# Patient Record
Sex: Female | Born: 1950 | Race: Black or African American | Hispanic: No | Marital: Single | Smoking: Former smoker
Health system: Southern US, Community
[De-identification: ages and names within clinical notes are randomized; demographics above are authoritative.]

## PROBLEM LIST (undated history)

## (undated) DIAGNOSIS — C649 Malignant neoplasm of unspecified kidney, except renal pelvis: Secondary | ICD-10-CM

## (undated) DIAGNOSIS — Z720 Tobacco use: Secondary | ICD-10-CM

## (undated) DIAGNOSIS — N289 Disorder of kidney and ureter, unspecified: Secondary | ICD-10-CM

## (undated) DIAGNOSIS — I4891 Unspecified atrial fibrillation: Secondary | ICD-10-CM

## (undated) DIAGNOSIS — J45909 Unspecified asthma, uncomplicated: Secondary | ICD-10-CM

## (undated) DIAGNOSIS — I10 Essential (primary) hypertension: Secondary | ICD-10-CM

## (undated) DIAGNOSIS — R011 Cardiac murmur, unspecified: Secondary | ICD-10-CM

## (undated) DIAGNOSIS — J449 Chronic obstructive pulmonary disease, unspecified: Secondary | ICD-10-CM

## (undated) DIAGNOSIS — C801 Malignant (primary) neoplasm, unspecified: Secondary | ICD-10-CM

## (undated) DIAGNOSIS — G4700 Insomnia, unspecified: Secondary | ICD-10-CM

## (undated) DIAGNOSIS — I340 Nonrheumatic mitral (valve) insufficiency: Secondary | ICD-10-CM

## (undated) DIAGNOSIS — M1990 Unspecified osteoarthritis, unspecified site: Secondary | ICD-10-CM

## (undated) DIAGNOSIS — D571 Sickle-cell disease without crisis: Secondary | ICD-10-CM

## (undated) DIAGNOSIS — I499 Cardiac arrhythmia, unspecified: Secondary | ICD-10-CM

## (undated) HISTORY — DX: Cardiac murmur, unspecified: R01.1

## (undated) HISTORY — DX: Nonrheumatic mitral (valve) insufficiency: I34.0

## (undated) HISTORY — DX: Chronic obstructive pulmonary disease, unspecified: J44.9

## (undated) HISTORY — PX: BONE GRAFT HIP ILIAC CREST: SUR159

## (undated) HISTORY — DX: Tobacco use: Z72.0

## (undated) HISTORY — PX: OTHER SURGICAL HISTORY: SHX169

## (undated) HISTORY — DX: Unspecified atrial fibrillation: I48.91

## (undated) HISTORY — PX: TUBAL LIGATION: SHX77

## (undated) HISTORY — DX: Essential (primary) hypertension: I10

## (undated) HISTORY — PX: CERVICAL CONIZATION W/BX: SHX1330

## (undated) HISTORY — PX: TONSILLECTOMY: SUR1361

## (undated) HISTORY — PX: ABDOMINAL HYSTERECTOMY: SHX81

---

## 1999-07-21 ENCOUNTER — Encounter: Payer: Self-pay | Admitting: Emergency Medicine

## 1999-07-21 ENCOUNTER — Emergency Department (HOSPITAL_COMMUNITY): Admission: EM | Admit: 1999-07-21 | Discharge: 1999-07-21 | Payer: Self-pay

## 2000-01-29 ENCOUNTER — Encounter: Payer: Self-pay | Admitting: Urology

## 2000-01-31 ENCOUNTER — Encounter (INDEPENDENT_AMBULATORY_CARE_PROVIDER_SITE_OTHER): Payer: Self-pay | Admitting: Specialist

## 2000-01-31 ENCOUNTER — Inpatient Hospital Stay (HOSPITAL_COMMUNITY): Admission: RE | Admit: 2000-01-31 | Discharge: 2000-02-04 | Payer: Self-pay | Admitting: Urology

## 2000-03-12 ENCOUNTER — Encounter: Payer: Self-pay | Admitting: Orthopedic Surgery

## 2000-03-12 ENCOUNTER — Inpatient Hospital Stay (HOSPITAL_COMMUNITY): Admission: RE | Admit: 2000-03-12 | Discharge: 2000-03-17 | Payer: Self-pay | Admitting: Orthopedic Surgery

## 2000-09-10 ENCOUNTER — Encounter: Payer: Self-pay | Admitting: Urology

## 2000-09-10 ENCOUNTER — Encounter: Admission: RE | Admit: 2000-09-10 | Discharge: 2000-09-10 | Payer: Self-pay | Admitting: Urology

## 2001-01-27 HISTORY — PX: NEPHRECTOMY: SHX65

## 2008-06-01 ENCOUNTER — Ambulatory Visit (HOSPITAL_COMMUNITY): Admission: RE | Admit: 2008-06-01 | Discharge: 2008-06-01 | Payer: Self-pay | Admitting: Internal Medicine

## 2010-02-17 ENCOUNTER — Encounter (HOSPITAL_COMMUNITY): Payer: Self-pay | Admitting: Internal Medicine

## 2010-05-07 LAB — GLUCOSE, CAPILLARY: Glucose-Capillary: 106 mg/dL — ABNORMAL HIGH (ref 70–99)
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
## 2010-06-14 NOTE — Op Note (Signed)
[facility]  Patient:    Tracy Johnston, Tracy Johnston                         MRN: [id number] Proc. Date: 01/31/00 Adm. Date:  [id number] Attending:  Trisha Mangle                           Operative Report  PREOPERATIVE DIAGNOSIS:  Solid right renal mass.  POSTOPERATIVE DIAGNOSIS:  Solid right renal mass.  PROCEDURE PERFORMED:  Right radical nephrectomy.  SURGEON:  Mark C. Vernie Ammons, M.D.  ASSISTANT:  Lucrezia Starch. Ovidio Hanger, M.D.  DRAINS:  16-French Foley catheter in the bladder.  SPECIMENS:  Right kidney to pathology.  ESTIMATED BLOOD LOSS:  Approximately 50 cc.  COMPLICATIONS:  None.  INDICATIONS:  The patient is a 60 year old black female who had some sharp pain located in the mid back randomly, and at times severe enough to take her breath away, cause shortness of breath, diaphoresis, and dizziness.  A CT scan was obtained to evaluate the abdomen and it revealed normal liver, gallbladder, pancreas, spleen, and a mixed predominately solid appearing mass arising from the posterior interpolar region of the right kidney measuring 4.7 cm in size with contralateral renal units between 30 and 40, and no evidence of fat within the lesion.  There was some focal parenchymal loss of the upper pole and lower pole.  There was no evidence of lymphadenopathy or extension into the renal vein and I therefore discussed with the patient the fact that this could be inflammatory such as a xanthogranulomatous pyelonephritis or possibly another benign lesion, but also could possibly represent renal cell carcinoma.  Because of the atrophic nature of the kidney itself with a normal appearing contralateral renal unit and a suspicious lesion in the right kidney, which has been associated at times with significant pain, the patient has elected for surgical removal of the kidney.  The risks and complications were discussed as well as limitations.  She understands and wished to  proceed.  DESCRIPTION OF PROCEDURE:  After informed consent, the patient was brought to the main operating room and administered intravenous Kefzol 1 g.  She was then administered general endotracheal anesthesia and positioned with her right flank up under a bean bag.  Her flank was sterilely prepped and draped, and a transverse incision was made subcostally around to the anterior abdominal region and carried down through subcutaneous tissue.  The external oblique fascia and muscle as well as internal oblique muscle and fascia were divided and the transversalis muscle was parted along its muscle fibers and I entered the retroperitoneal space.  I extended the incision medially and entered the peritoneal cavity.  The colon was identified and I used a blunt technique to dissect between the ascending colonic mesentery and Gerotas fascia, and noted a moderate amount of scarring indicative of prior inflammatory process.  I dissected posteriorly behind the kidney and also identified the ureter inferiorly through a combination of sharp and blunt technique.  The ureter was then clipped with hemoclips and divided.  I incised along the superior aspect of the kidney placing hemoclips in this region and dividing the tissue between the kidney and adrenal gland maintaining the adrenal gland in situ.  I then dissected the remainder of the colon and duodenum from the hilar region and great vessels and identified the vasculature.  There was a small artery and a single vein.  These  were tied with 0 silk suture doubly and then a hemoclip was applied.  The vessels were then divided distally and the specimen freed and sent to pathology.  The wound was copiously irrigated with saline, bleeding was noted, and the liver was noted to be intact without injury.  The appendix appeared normal.  I positioned the ascending colon back in its normal anatomic position and then closed the incision with running #1 PDS  suture.  I closed the internal oblique fascia and then injected Marcaine beneath this.  The external oblique fascia was then closed in an identical fashion.  Further Marcaine was injected and the wound was irrigated with saline and closed with skin staples.  A sterile occlusive dressing was applied and the patient was awakened and taken to the recovery room in stable and satisfactory condition.  She tolerated the procedure well.  There were no intraoperative complications.  Needle, sponge, and instrument counts were reportedly correct x 2 at the end of the operation. D:  01/31/00 TD:  01/31/00 Job: [id number]

## 2010-06-14 NOTE — Discharge Summary (Signed)
[place]. [facility]  Patient:    Tracy, Johnston                         MRN: [id number] Adm. Date:  [id number] Disc. Date: 02/04/00 Attending:  Trisha Mangle CC:         Dr. 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  OTHER DIAGNOSIS:  Hypertension.  MAJOR OPERATION:  Right radical mastectomy.  DIET:  Regular.  ACTIVITY:  No heavy lifting, strenuous activity, or driving for three more weeks.  FOLLOW-UP APPOINTMENT:  Follow up will be in one week in my office to have her skin staples removed.  DISCHARGE MEDICATIONS: 1. Ditropan XL 10 mg q.d. #30. 2. Tylox 1 to 2 q.4h. p.r.n. #40. 3. Colace 100 mg b.i.d. #14.  HISTORY OF PRESENT ILLNESS:  The patient is a 60 year old black female who had a mass identified on her CT scan in her right kidney.  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  She has had some hypertension which I am not sure is related to this kidney, but it is possible.  She had also been having some pain in her right flank.  In addition to this, she has a history of urge incontinence which I have treated with anticholinergics, and she mentioned that that was effective.  The remainder of her history and physical was previously dictated and is noted on the chart.  HOSPITAL COURSE:  Her hospital course consists of routine admission and with the patient being taken to the operating room on the day of her admission. She underwent a right radical nephrectomy without complication and no need for transfusion.  There was approximately a 50 cc blood loss.  That evening she was doing well with slightly decreased breath sounds but overall was doing well from surgery.  On her first postoperative day, she was afebrile.  Dressing remained dry.  She was a little slow to ambulate and  pulmonary toilet was encouraged.  On her second postoperative day, she continued to have no flatus, remained on sips and chips.  Ambulation was encouraged, and her Foley was removed.  She was continued on intravenous fluids.  By the third postoperative day, she remained sore and had no bowel movement but had positive bowel sounds.  She was ambulating, and I stopped her morphine PCA and began oral pain medications as well as a clear liquid diet and laxatives.  By the February 04, 2000, she had had a bowel movement, was tolerating a regular diet and remained afebrile.  Her incision was healing well, and I discussed her pathology with her which revealed renal cell carcinoma with negative margins of resection.  The maximum tumor size was 4.3 cm.  It was clear cell grade 2.  The margins were negative.  There was no renal invasion or extension through the capsule.  This would be a pT1 lesion.  At this point, the patient is ready for discharge. DD:  02/04/00 TD:  02/04/00 Job: [id number]

## 2010-10-18 ENCOUNTER — Ambulatory Visit (INDEPENDENT_AMBULATORY_CARE_PROVIDER_SITE_OTHER): Payer: Medicare Other | Admitting: Cardiovascular Disease

## 2010-10-18 ENCOUNTER — Encounter: Payer: Self-pay | Admitting: Cardiovascular Disease

## 2010-10-18 DIAGNOSIS — I4891 Unspecified atrial fibrillation: Secondary | ICD-10-CM

## 2010-10-18 DIAGNOSIS — I739 Peripheral vascular disease, unspecified: Secondary | ICD-10-CM | POA: Insufficient documentation

## 2010-10-18 DIAGNOSIS — M79606 Pain in leg, unspecified: Secondary | ICD-10-CM

## 2010-10-18 DIAGNOSIS — Z720 Tobacco use: Secondary | ICD-10-CM | POA: Insufficient documentation

## 2010-10-18 DIAGNOSIS — I10 Essential (primary) hypertension: Secondary | ICD-10-CM

## 2010-10-18 DIAGNOSIS — M79609 Pain in unspecified limb: Secondary | ICD-10-CM

## 2010-10-18 MED ORDER — DILTIAZEM HCL ER COATED BEADS 240 MG PO CP24: 240.0000 mg | ORAL_CAPSULE | Freq: Every day | ORAL | Status: DC

## 2010-10-18 NOTE — Assessment & Plan Note (Signed)
This appears to be paroxysmal based on her symptoms. She continues to have irregular rhythm today. Her ventricular rate is controlled. I am concerned though that the atenolol might be contributing to some of her wheezing. Thus, I will go ahead and stop atenolol and switch her to diltiazem extended release 240 mg once daily. Given that her blood pressure is borderline low and in order to avoid being on to calcium channel blockers, I would also stop amlodipine. Her chads score is 1 due to history of hypertension. Her chads vas score is 2 due to female gender. At this time I recommend aspirin 81 mg once daily. I will obtain a 2-D echo to evaluate her left ventricular systolic function as well as left atrial size. I will decide upon followup of rhythm control is needed and will further evaluate the need for anticoagulation. I will obtain a thyroid panel if not already done during her hospitalization.

## 2010-10-18 NOTE — Progress Notes (Signed)
HPI  This is a 60 year old female who is referred for evaluation of atrial fibrillation which was diagnosed during her recent hospitalization at [facility]. Patient was hospitalized on September 10 until the 17th due to severe COPD and persistent cough. The patient has prolonged history of smoking. She quit smoking since her hospitalization. She was seen by Dr. Orson Aloe during her hospitalization. On presentation here ECG showed normal sinus rhythm. She was noted to have irregular heartbeats before discharge an ECG showed atrial fibrillation. Both of these were reviewed. The patient reports prolonged history of palpitations in the past but is not sure if she had atrial fibrillation. She has no previous history of stroke or congestive heart failure. She has no diabetes. She continues to have significant dyspnea and wheezing but her pulmonary symptoms improved after she quit smoking. She denies any chest tightness. She denies any palpitations or tachycardia. She does complain of significant cramping in her legs especially with walking.  Allergies  Allergen Reactions  . Aspirin Other (See Comments)    Bleeding at higher doses     No current outpatient prescriptions on file prior to visit.     Past Medical History  Diagnosis Date  . Atrial fibrillation   . Tobacco abuse   . Hypertension   . COPD (chronic obstructive pulmonary disease)      History reviewed. No pertinent past surgical history.   History reviewed. No pertinent family history.   History   Social History  . Marital Status: Single    Spouse Name: N/A    Number of Children: N/A  . Years of Education: N/A   Occupational History  . Not on file.   Social History Main Topics  . Smoking status: Former Smoker    Quit date: 10/14/2010  . Smokeless tobacco: Not on file   Comment: just quit since getting out of hospital   . Alcohol Use: Not on file  . Drug Use: Not on file  . Sexually Active: Not on file   Other  Topics Concern  . Not on file   Social History Narrative  . No narrative on file     ROS Constitutional: Negative for fever, chills, diaphoresis, activity change, appetite change and fatigue.  HENT: Negative for hearing loss, nosebleeds, congestion, sore throat, facial swelling, drooling, trouble swallowing, neck pain, voice change, sinus pressure and tinnitus.  Eyes: Negative for photophobia, pain, discharge and visual disturbance.  Respiratory: Negative for apnea,  chest tightness. Cardiovascular: Negative for chest pain, palpitations and leg swelling.  Gastrointestinal: Negative for nausea, vomiting, abdominal pain, diarrhea, constipation, blood in stool and abdominal distention.  Genitourinary: Negative for dysuria, urgency, frequency, hematuria and decreased urine volume.  Musculoskeletal: Negative for myalgias, back pain, joint swelling, arthralgias and gait problem.  Skin: Negative for color change, pallor, rash and wound.  Neurological: Negative for dizziness, tremors, seizures, syncope, speech difficulty, weakness, light-headedness, numbness and headaches.  Psychiatric/Behavioral: Negative for suicidal ideas, hallucinations, behavioral problems and agitation. The patient is not nervous/anxious.     PHYSICAL EXAM   BP 100/70  Pulse 88  Ht 5\' 2"  (1.575 m)  Wt 201 lb 12.8 oz (91.536 kg)  BMI 36.91 kg/m2  SpO2 95%  Constitutional: She is oriented to person, place, and time. She appears well-developed and well-nourished. No distress.  HENT: No nasal discharge.  Head: Normocephalic and atraumatic.  Eyes: Pupils are equal, round, and reactive to light. Right eye exhibits no discharge. Left eye exhibits no discharge.  Neck: Normal range of  motion. Neck supple. No JVD present. No thyromegaly present.  Cardiovascular: Normal rate, irregular rhythm, normal heart sounds and diminished distal pulses. Exam reveals no gallop and no friction rub.  No murmur heard.  Pulmonary/Chest:  Effort normal. No stridor. No respiratory distress. Auscultation reveals mild expiratory wheezing with bilateral rhonchi.  Abdominal: Soft. Bowel sounds are normal. She exhibits no distension. There is no tenderness. There is no rebound and no guarding.  Musculoskeletal: Normal range of motion. She exhibits no edema and no tenderness.  Neurological: She is alert and oriented to person, place, and time. Coordination normal.  Skin: Skin is warm and dry. No rash noted. She is not diaphoretic. No erythema. No pallor.  Psychiatric: She has a normal mood and affect. Her behavior is normal. Judgment and thought content normal.      ASSESSMENT AND PLAN

## 2010-10-18 NOTE — Assessment & Plan Note (Signed)
The patient reports lower extremity claudication. Given her prolonged history of smoking, I will obtain an ABI for further evaluation.

## 2010-10-18 NOTE — Assessment & Plan Note (Signed)
The patient quit smoking since her hospital discharge. I advised her to stay away from nicotine in the future.

## 2010-10-18 NOTE — Assessment & Plan Note (Signed)
Her blood pressure is borderline low. Changes have been made as mentioned above.

## 2010-10-18 NOTE — Patient Instructions (Signed)
Follow up as scheduled. Stop Atenolol. Stop Amlodipine. Start Diltiazem 240 mg daily. Your physician has requested that you have an echocardiogram. Echocardiography is a painless test that uses sound waves to create images of your heart. It provides your doctor with information about the size and shape of your heart and how well your heart's chambers and valves are working. This procedure takes approximately one hour. There are no restrictions for this procedure. Your physician has requested that you have an ankle brachial index (ABI). During this test an ultrasound and blood pressure cuff are used to evaluate the arteries that supply the arms and legs with blood. Allow thirty minutes for this exam. There are no restrictions or special instructions.

## 2010-10-24 ENCOUNTER — Telehealth: Payer: Self-pay | Admitting: Cardiovascular Disease

## 2010-10-24 ENCOUNTER — Other Ambulatory Visit (INDEPENDENT_AMBULATORY_CARE_PROVIDER_SITE_OTHER): Payer: Medicare Other | Admitting: *Deleted

## 2010-10-24 ENCOUNTER — Encounter (INDEPENDENT_AMBULATORY_CARE_PROVIDER_SITE_OTHER): Payer: Medicare Other | Admitting: *Deleted

## 2010-10-24 DIAGNOSIS — I10 Essential (primary) hypertension: Secondary | ICD-10-CM

## 2010-10-24 DIAGNOSIS — E1159 Type 2 diabetes mellitus with other circulatory complications: Secondary | ICD-10-CM

## 2010-10-24 DIAGNOSIS — I4891 Unspecified atrial fibrillation: Secondary | ICD-10-CM

## 2010-10-24 DIAGNOSIS — I739 Peripheral vascular disease, unspecified: Secondary | ICD-10-CM

## 2010-10-24 DIAGNOSIS — M79606 Pain in leg, unspecified: Secondary | ICD-10-CM

## 2010-10-25 NOTE — Telephone Encounter (Signed)
Echo was done yesterday. I do not see results available in Epic yet.

## 2010-10-25 NOTE — Telephone Encounter (Signed)
I have to make sure that her echo was fine first. Do you have results of her echo?

## 2010-10-28 NOTE — Telephone Encounter (Signed)
Patient informed. 

## 2010-10-28 NOTE — Telephone Encounter (Signed)
Inform her that she can have her teeth pulled. Echo showed normal heart function.

## 2010-10-29 ENCOUNTER — Telehealth: Payer: Self-pay | Admitting: Cardiovascular Disease

## 2010-10-29 NOTE — Telephone Encounter (Signed)
Pt calling stating she needs surgical clearance for dental procedure--pt sees dr Kirke Corin in [place]--i called dental office (dr jenson--[phone]) to see if they had received clearance and they had not received --i called pt back and she said someone in [place] office told pt clearance had been faxed--pt has appoint in [place] with dr Kirke Corin 10/3--advised to keep appoint with dr Kirke Corin and perhaps he can give clearance --also advised to call [place] office and find out who supposedly sent clearance--pt agrees--nt

## 2010-10-29 NOTE — Telephone Encounter (Signed)
Dr Eileen Stanford see Tracy Johnston, home health cannot do c-diff testing without order,so they are going to call dr Andrey Campanile and have him write order--nt

## 2010-10-29 NOTE — Telephone Encounter (Signed)
Pt calling wanting to see if we received information for pt surgical clearance. If so pt would like for [place] to fax that information over to the [place] office. Please return pt call to discuss further.

## 2010-10-31 ENCOUNTER — Encounter: Payer: Self-pay | Admitting: Cardiovascular Disease

## 2010-10-31 ENCOUNTER — Ambulatory Visit (INDEPENDENT_AMBULATORY_CARE_PROVIDER_SITE_OTHER): Payer: Medicare Other | Admitting: Cardiovascular Disease

## 2010-10-31 VITALS — BP 107/76 | HR 88 | Ht 62.0 in | Wt 197.4 lb

## 2010-10-31 DIAGNOSIS — I739 Peripheral vascular disease, unspecified: Secondary | ICD-10-CM

## 2010-10-31 DIAGNOSIS — I4891 Unspecified atrial fibrillation: Secondary | ICD-10-CM

## 2010-10-31 DIAGNOSIS — Z720 Tobacco use: Secondary | ICD-10-CM

## 2010-10-31 DIAGNOSIS — F17200 Nicotine dependence, unspecified, uncomplicated: Secondary | ICD-10-CM

## 2010-10-31 DIAGNOSIS — Z01810 Encounter for preprocedural cardiovascular examination: Secondary | ICD-10-CM | POA: Insufficient documentation

## 2010-10-31 NOTE — Patient Instructions (Signed)
Follow up as scheduled. Your physician recommends that you continue on your current medications as directed. Please refer to the Current Medication list given to you today. 

## 2010-10-31 NOTE — Progress Notes (Signed)
HPI  This is a 60 year old female who is here today for a followup visit. She was seen recently for atrial fibrillation. It appears that she had atrial fibrillation since she was in her early 25s. She was taken off anticoagulation in the past but did not have complications with that. She has no previous history of ischemic heart disease. She denies any chest pain. She has a long history of smoking but quit recently after she was hospitalized for COPD. During her last visit with me, she  had significant wheezing on exam and thus I decided to stop atenolol and switch her to diltiazem. The patient's respiratory status has improved significantly since her last visit. Her dyspnea has improved. She denies any palpitations. She has no previous bleeding history. He did complain of lower extremity discomfort but her ABI was within normal limits. The patient needs to have dental extraction under general anesthesia.  Allergies  Allergen Reactions  . Aspirin Other (See Comments)    Bleeding at higher doses     Current Outpatient Prescriptions on File Prior to Visit  Medication Sig Dispense Refill  . albuterol (ACCUNEB) 0.63 MG/3ML nebulizer solution Take 1 ampule by nebulization every 6 (six) hours as needed.        . diltiazem (CARTIA XT) 240 MG 24 hr capsule Take 1 capsule (240 mg total) by mouth daily.  30 capsule  3  . docusate sodium (COLACE) 100 MG capsule Take 100 mg by mouth 2 (two) times daily as needed.        . Fluticasone-Salmeterol (ADVAIR) 250-50 MCG/DOSE AEPB Inhale 1 puff into the lungs every 12 (twelve) hours.        Marland Kitchen olmesartan-hydrochlorothiazide (BENICAR HCT) 40-12.5 MG per tablet Take 1 tablet by mouth daily.        . pregabalin (LYRICA) 75 MG capsule Take 75 mg by mouth 2 (two) times daily.           Past Medical History  Diagnosis Date  . Tobacco abuse   . Hypertension   . COPD (chronic obstructive pulmonary disease)   . Atrial fibrillation     chronic. Was first diagnosed in  her early 79s.  . Mitral regurgitation     Mild to moderate. Normal EF 09/2010     No past surgical history on file.   No family history on file.   History   Social History  . Marital Status: Single    Spouse Name: N/A    Number of Children: N/A  . Years of Education: N/A   Occupational History  . Not on file.   Social History Main Topics  . Smoking status: Former Smoker    Quit date: 10/14/2010  . Smokeless tobacco: Not on file   Comment: just quit since getting out of hospital   . Alcohol Use: Not on file  . Drug Use: Not on file  . Sexually Active: Not on file   Other Topics Concern  . Not on file   Social History Narrative  . No narrative on file     PHYSICAL EXAM   BP 107/76  Pulse 88  Ht 5\' 2"  (1.575 m)  Wt 197 lb 6.4 oz (89.54 kg)  BMI 36.10 kg/m2  Constitutional: She is oriented to person, place, and time. She appears well-developed and well-nourished. No distress.  HENT: No nasal discharge.  Head: Normocephalic and atraumatic.  Eyes: Pupils are equal, round, and reactive to light. Right eye exhibits no discharge. Left eye exhibits no  discharge.  Neck: Normal range of motion. Neck supple. No JVD present. No thyromegaly present.  Cardiovascular: Normal rate, irregular rhythm, normal heart sounds and intact distal pulses. Exam reveals no gallop and no friction rub.  No murmur heard.  Pulmonary/Chest: Effort normal and breath sounds normal. No stridor. No respiratory distress. She has no wheezes. She has no rales. She exhibits no tenderness.  Abdominal: Soft. Bowel sounds are normal. She exhibits no distension. There is no tenderness. There is no rebound and no guarding.  Musculoskeletal: Normal range of motion. She exhibits no edema and no tenderness.  Neurological: She is alert and oriented to person, place, and time. Coordination normal.  Skin: Skin is warm and dry. No rash noted. She is not diaphoretic. No erythema. No pallor.  Psychiatric: She  has a normal mood and affect. Her behavior is normal. Judgment and thought content normal.     EKG: Atrial fibrillation with ventricular rate of 79 beats per minute. No significant ST or T wave changes. No evidence of prior infarcts.   ASSESSMENT AND PLAN

## 2010-10-31 NOTE — Assessment & Plan Note (Signed)
I congratulated her on smoking cessation. 

## 2010-10-31 NOTE — Assessment & Plan Note (Signed)
The patient seems to have chronic atrial fibrillation. She does not seem to be significantly symptomatic from this and thus I recommended control strategy. This is currently being done with diltiazem which seems to be working better than atenolol. Have [place] VAS score is 2 due to hypotension and female gender. Given that she is in chronic atrial fibrillation also her overall thromboembolic risk is higher than the average. Thus, I recommend long-term anticoagulation to decrease her future risk of stroke. This will be initiated after her dental extraction surgery and after we make sure that her labs are within acceptable range. I discussed forms of anticoagulation that include warfarin or other options. The patient prefers something other than warfarin and will likely start her on Pradaxa upon followup. She will followup in one month from now.

## 2010-10-31 NOTE — Assessment & Plan Note (Signed)
Her symptoms seems to be neuropathic in nature. Her ABI was normal.

## 2010-10-31 NOTE — Assessment & Plan Note (Signed)
The patient is going to have dental extraction under general anesthesia. She has no previous history of ischemic heart disease and does not have convincing symptoms of angina. Her ECG does not show any ischemic changes. Her functional capacity is reduced but not poor. Thus, the patient can undergo the planned procedure at an overall low risk from a cardiac standpoint. Aspirin can be stopped 5-7 days before surgery if needed although I would prefer her to stay on 81 mg once daily if at all possible. Diltiazem should be continued for rate control.

## 2010-11-18 ENCOUNTER — Ambulatory Visit (HOSPITAL_BASED_OUTPATIENT_CLINIC_OR_DEPARTMENT_OTHER)
Admission: RE | Admit: 2010-11-18 | Discharge: 2010-11-18 | Disposition: A | Discharge status: Home / Self Care | Payer: Medicare Other | Source: Ambulatory Visit | Attending: Oral Surgery | Admitting: Oral Surgery

## 2010-11-18 DIAGNOSIS — Z85528 Personal history of other malignant neoplasm of kidney: Secondary | ICD-10-CM | POA: Insufficient documentation

## 2010-11-18 DIAGNOSIS — J4489 Other specified chronic obstructive pulmonary disease: Secondary | ICD-10-CM | POA: Insufficient documentation

## 2010-11-18 DIAGNOSIS — I10 Essential (primary) hypertension: Secondary | ICD-10-CM | POA: Insufficient documentation

## 2010-11-18 DIAGNOSIS — K089 Disorder of teeth and supporting structures, unspecified: Secondary | ICD-10-CM | POA: Insufficient documentation

## 2010-11-18 DIAGNOSIS — D573 Sickle-cell trait: Secondary | ICD-10-CM | POA: Insufficient documentation

## 2010-11-18 DIAGNOSIS — I4891 Unspecified atrial fibrillation: Secondary | ICD-10-CM | POA: Insufficient documentation

## 2010-11-18 DIAGNOSIS — J449 Chronic obstructive pulmonary disease, unspecified: Secondary | ICD-10-CM | POA: Insufficient documentation

## 2010-11-18 LAB — POCT I-STAT, CHEM 8
Calcium, Ion: 1.14 mmol/L (ref 1.12–1.32)
Chloride: 111 mEq/L (ref 96–112)
HCT: 43 % (ref 36.0–46.0)
Hemoglobin: 14.6 g/dL (ref 12.0–15.0)
Potassium: 4 mEq/L (ref 3.5–5.1)

## 2010-11-19 NOTE — Op Note (Signed)
Tracy Johnston, Tracy Johnston NO.:  [id number]  MEDICAL RECORD NO.:  [id number]  LOCATION:                                 FACILITY:  PHYSICIAN:  Georgia Lopes, M.D.  DATE OF BIRTH:  October 01, 1950  DATE OF PROCEDURE:  11/18/2010 DATE OF DISCHARGE:                              OPERATIVE REPORT   PREOPERATIVE DIAGNOSIS:  Nonrestorable teeth #1,2,5,6,12,13,15, 20, 21, 28, 29, and 32.  POSTOPERATIVE DIAGNOSIS:  Nonrestorable teeth #1,2,5,6,12,13,15, 20, 21, 28, 29, and 32.  PROCEDURE:  Extraction of teeth numbers as per above, alveoplasty maxilla and mandible.  SURGEON:  Georgia Lopes, MD  ANESTHESIA:  General, Dr. Ivin Booty, attending, nasal intubation.  ASSISTANTS:  Nixon and Cimler.  INDICATIONS FOR PROCEDURE:  Tracy Johnston is a 60 year old female, who was referred to my office by her general dentist for removal of all remaining teeth and smoothing the bone.  Past medical history is significant for severe COPD, hypertension, atrial fibrillation, status post nephrectomy for renal carcinoma and sickle cell trait.  She is followed at the [facility] and preoperative medical consultation was obtained.  Because of the extensiveness of the surgery, general anesthesia was required and the patient was scheduled as an outpatient at today's surgery.  PROCEDURE:  The patient was taken to the operating room, placed on the table in supine position.  General anesthesia was administered intravenously and a nasal endotracheal tube was placed.  The tube was taped and the eyes were protected.  The patient was then draped for the procedure.  The posterior pharynx was suctioned with Yankauer suction. The throat pack was placed.  2% lidocaine 1:100,000 epinephrine was infiltrated in an inferior alveolar block on the right and left side and buccal and palatal infiltration the maxilla total of 18 mL utilized.  A bite block and sweetheart retractor were placed on the right side of  the mouth and the left side was operated 1st.  #15 blade was used to make a full-thickness incision approximately 1 cm proximal to teeth numbers 20 and 21 and an elliptical incision and carried buckling and lingually to the 2 teeth and then carrying elliptically anterior approximately 1 cm. Then, a 15 blade was also used to incise around teeth #12, 13, and 15 with an anterior elliptical wedge.  The periosteum was then reflected from around all these teeth and interproximal bone was removed with the handpiece under irrigation.  The teeth were elevated and removed from the mouth with the dental forceps, sockets were then curetted and then the periosteum was further reflected and alveoplasty was performed with the egg-shaped bur and a bone file.  Then, the areas were sutured with 3- 0 chromic and the mouth retractor were repositioned to the other side of the mouth and the right side was operated on.  The 15 blade was used to make a full-thickness incision around teeth #28, 29, and 32 with a distal wedge extensions and then around the maxillary teeth numbers 1, 2, 5 and 6 with the distal wedge excisions.  Then, the periosteum was reflected, interproximal bone was removed with the handpiece under irrigation and these teeth were elevated with a 301 elevator and  removed from the mouth with forceps .  The sockets were curetted and then the periosteum was further reflected in the maxilla mandible to expose the alveolar crest, the bone was then smoothed with a bone file and the egg- shaped bur.  Then, these areas were irrigated and closed with 3-0 chromic.  The oral cavity was inspected, found to have good contour and closure and hemostasis.  The oral cavity was irrigated copiously and suctioned. Throat pack was removed.  The patient was awakened and taken to the recovery room, breathing spontaneously in good condition.  EBL minimum. Complications none.  Specimens none.     Georgia Lopes,  M.D.     SMJ/MEDQ  D:  11/18/2010  T:  11/18/2010  Job:  [id number]  Electronically Signed by Ocie Doyne M.D. on 11/19/2010 10:18:53 AM

## 2010-12-02 ENCOUNTER — Ambulatory Visit (INDEPENDENT_AMBULATORY_CARE_PROVIDER_SITE_OTHER): Payer: Medicare Other | Admitting: Cardiovascular Disease

## 2010-12-02 ENCOUNTER — Encounter: Payer: Self-pay | Admitting: Cardiovascular Disease

## 2010-12-02 DIAGNOSIS — Z7982 Long term (current) use of aspirin: Secondary | ICD-10-CM

## 2010-12-02 DIAGNOSIS — R0600 Dyspnea, unspecified: Secondary | ICD-10-CM | POA: Insufficient documentation

## 2010-12-02 DIAGNOSIS — R0609 Other forms of dyspnea: Secondary | ICD-10-CM

## 2010-12-02 DIAGNOSIS — I4891 Unspecified atrial fibrillation: Secondary | ICD-10-CM

## 2010-12-02 DIAGNOSIS — I10 Essential (primary) hypertension: Secondary | ICD-10-CM

## 2010-12-02 LAB — PROTIME-INR

## 2010-12-02 MED ORDER — RIVAROXABAN 20 MG PO TABS: 20.0000 mg | ORAL_TABLET | Freq: Every day | ORAL | Status: DC

## 2010-12-02 NOTE — Progress Notes (Signed)
HPI  This is a 60 year old female who is here today for a followup visit. He is being followed for chronic atrial fibrillation. She had recent teeth extraction surgery without complications. She is going to have dentures. Overall she denies any chest pain. She has chronic dyspnea related to COPD and still with intermittent wheezing. She quit smoking a few months ago. She does complain of feeling tired. She denies any palpitations.  Allergies  Allergen Reactions  . Aspirin Other (See Comments)    Bleeding at higher doses     Current Outpatient Prescriptions on File Prior to Visit  Medication Sig Dispense Refill  . albuterol (ACCUNEB) 0.63 MG/3ML nebulizer solution Take 1 ampule by nebulization every 6 (six) hours as needed.        Marland Kitchen aspirin 81 MG tablet Take 81 mg by mouth daily.        Marland Kitchen diltiazem (CARTIA XT) 240 MG 24 hr capsule Take 1 capsule (240 mg total) by mouth daily.  30 capsule  3  . docusate sodium (COLACE) 100 MG capsule Take 100 mg by mouth 2 (two) times daily as needed.        . Fluticasone-Salmeterol (ADVAIR) 250-50 MCG/DOSE AEPB Inhale 1 puff into the lungs every 12 (twelve) hours.        Marland Kitchen olmesartan-hydrochlorothiazide (BENICAR HCT) 40-12.5 MG per tablet Take 1 tablet by mouth daily.        . pregabalin (LYRICA) 75 MG capsule Take 75 mg by mouth daily.          Past Medical History  Diagnosis Date  . Tobacco abuse   . Hypertension   . COPD (chronic obstructive pulmonary disease)   . Mitral regurgitation     Mild to moderate. Normal EF 09/2010  . Atrial fibrillation     chronic. Was first diagnosed in her early 28s.     History reviewed. No pertinent past surgical history.   History reviewed. No pertinent family history.   History   Social History  . Marital Status: Single    Spouse Name: N/A    Number of Children: N/A  . Years of Education: N/A   Occupational History  . Not on file.   Social History Main Topics  . Smoking status: Former Smoker   Types: Cigarettes    Quit date: 10/14/2010  . Smokeless tobacco: Never Used   Comment: just quit since getting out of hospital   . Alcohol Use: Not on file  . Drug Use: Not on file  . Sexually Active: Not on file   Other Topics Concern  . Not on file   Social History Narrative  . No narrative on file     PHYSICAL EXAM   BP 116/81  Pulse 81  Ht 5\' 2"  (1.575 m)  Wt 195 lb (88.451 kg)  BMI 35.67 kg/m2  Constitutional: She is oriented to person, place, and time. She appears well-developed and well-nourished. No distress.  HENT: No nasal discharge.  Head: Normocephalic and atraumatic.  Eyes: Pupils are equal, round, and reactive to light. Right eye exhibits no discharge. Left eye exhibits no discharge.  Neck: Normal range of motion. Neck supple. No JVD present. No thyromegaly present.  Cardiovascular: Normal rate, irregular rhythm, normal heart sounds. Exam reveals no gallop and no friction rub.  No murmur heard.  Pulmonary/Chest: Effort normal and breath sounds normal. No stridor. No respiratory distress. She has mild wheezes. She has no rales. She exhibits no tenderness.  Abdominal: Soft. Bowel sounds are  normal. She exhibits no distension. There is no tenderness. There is no rebound and no guarding.  Musculoskeletal: Normal range of motion. She exhibits no edema and no tenderness.  Neurological: She is alert and oriented to person, place, and time. Coordination normal.  Skin: Skin is warm and dry. No rash noted. She is not diaphoretic. No erythema. No pallor.  Psychiatric: She has a normal mood and affect. Her behavior is normal. Judgment and thought content normal.      ASSESSMENT AND PLAN

## 2010-12-02 NOTE — Patient Instructions (Signed)
Your physician wants you to follow-up in: 3 months. You will receive a reminder letter in the mail one-two months in advance. If you don't receive a letter, please call our office to schedule the follow-up appointment. Your physician recommends that you go to the [facility] for lab work: CBC/PT/INR Xarelto 20 mg daily. Stop Aspirin

## 2010-12-02 NOTE — Assessment & Plan Note (Signed)
The patient has chronic atrial fibrillation. Her ventricular rate is controlled with Cardizem. Have [place] VAS score is 2. Thus, I recommend long-term anticoagulation. This was discussed extensively with the patient. She definitely prefers not to go back on warfarin due to interaction with diet. Thus, I will go ahead and start her on Xarelto 20 mg once daily. The patient's renal function is normal. She had that checked recently. I will check CBC as well as PT/INR due to initiation of treatment. I will stop aspirin.

## 2010-12-02 NOTE — Assessment & Plan Note (Signed)
Her blood pressure is well controlled. Continue current medications. 

## 2010-12-02 NOTE — Assessment & Plan Note (Signed)
The patient has dyspnea without chest pain which could be related to have known COPD. However, I think it's reasonable to rule out underlying obstructive coronary artery disease given her multiple risk factors. This will likely be requested upon followup.

## 2010-12-09 ENCOUNTER — Telehealth: Payer: Self-pay | Admitting: *Deleted

## 2010-12-09 NOTE — Telephone Encounter (Signed)
Pt left message on voicemail at [phone] asking for return call regarding blood thinner medicine that Dr. Kirke Corin prescribed for her.   Pt states Xarelto needed authorization. She states [facility] told her that they faxed authorization to [place] on Wed. We have not received this as of today. Pt notified we will contact her pharmacy [facility]) to inquire about this.  Spoke with [facility] staff who states they faxed form to [place]. It appears this was faxed to the incorrect fax number. They will refax today.

## 2011-02-06 ENCOUNTER — Encounter: Payer: Self-pay | Admitting: *Deleted

## 2011-02-06 NOTE — Telephone Encounter (Signed)
This encounter was created in error - please disregard.

## 2011-02-17 ENCOUNTER — Other Ambulatory Visit: Payer: Self-pay | Admitting: *Deleted

## 2011-02-17 MED ORDER — DILTIAZEM HCL ER COATED BEADS 240 MG PO CP24: 240.0000 mg | ORAL_CAPSULE | Freq: Every day | ORAL | Status: DC

## 2011-03-03 ENCOUNTER — Ambulatory Visit (INDEPENDENT_AMBULATORY_CARE_PROVIDER_SITE_OTHER): Payer: Medicare Other | Admitting: Cardiovascular Disease

## 2011-03-03 ENCOUNTER — Encounter: Payer: Self-pay | Admitting: Cardiovascular Disease

## 2011-03-03 DIAGNOSIS — J449 Chronic obstructive pulmonary disease, unspecified: Secondary | ICD-10-CM | POA: Insufficient documentation

## 2011-03-03 DIAGNOSIS — R0609 Other forms of dyspnea: Secondary | ICD-10-CM

## 2011-03-03 DIAGNOSIS — Z720 Tobacco use: Secondary | ICD-10-CM

## 2011-03-03 DIAGNOSIS — I4891 Unspecified atrial fibrillation: Secondary | ICD-10-CM

## 2011-03-03 DIAGNOSIS — F17200 Nicotine dependence, unspecified, uncomplicated: Secondary | ICD-10-CM

## 2011-03-03 DIAGNOSIS — R0600 Dyspnea, unspecified: Secondary | ICD-10-CM

## 2011-03-03 MED ORDER — LEVOFLOXACIN 500 MG PO TABS: 500.0000 mg | ORAL_TABLET | Freq: Every day | ORAL | Status: AC

## 2011-03-03 MED ORDER — METHYLPREDNISOLONE 4 MG PO KIT: PACK | ORAL | Status: AC

## 2011-03-03 NOTE — Assessment & Plan Note (Signed)
Her heart rate is well controlled on current dose of diltiazem. She is tolerating anticoagulation with Xarelto without any reported side effects.

## 2011-03-03 NOTE — Patient Instructions (Signed)
Follow up as scheduled. Medrol Dosepak. Take as directed. Levaquin 500 mg-Take 1 tablet daily for 5 days.

## 2011-03-03 NOTE — Assessment & Plan Note (Signed)
I strongly advised her to quit smoking especially with her history of severe COPD.

## 2011-03-03 NOTE — Assessment & Plan Note (Signed)
As I mentioned before, the patient will need cardiac ischemic evaluation with a pharmacologic nuclear stress test once his COPD is better. I will reevaluate her in 6 weeks.

## 2011-03-03 NOTE — Progress Notes (Signed)
HPI  This is a 61 year old female who is here today for a followup visit. She has chronic atrial fibrillation currently being managed with rate control as well as anticoagulation. She also has severe COPD and continues to smoke. She has been having significant dyspnea, wheezing and productive cough over the last few days. She appears to be in mild respiratory distress. She denies any chest pain. There has been no palpitations or syncope. She is tolerating anticoagulation without reported side effects.  Allergies  Allergen Reactions  . Aspirin Other (See Comments)    Bleeding at higher doses     Current Outpatient Prescriptions on File Prior to Visit  Medication Sig Dispense Refill  . albuterol (ACCUNEB) 0.63 MG/3ML nebulizer solution Take 1 ampule by nebulization every 6 (six) hours as needed.       . diltiazem (CARTIA XT) 240 MG 24 hr capsule Take 1 capsule (240 mg total) by mouth daily.  30 capsule  6  . docusate sodium (COLACE) 100 MG capsule Take 100 mg by mouth 2 (two) times daily as needed.        . Fluticasone-Salmeterol (ADVAIR) 250-50 MCG/DOSE AEPB Inhale 1 puff into the lungs every 12 (twelve) hours.        Marland Kitchen olmesartan-hydrochlorothiazide (BENICAR HCT) 40-12.5 MG per tablet Take 1 tablet by mouth daily.        . pregabalin (LYRICA) 75 MG capsule Take 75 mg by mouth daily.       . Rivaroxaban (XARELTO) 20 MG TABS Take 20 mg by mouth daily.  30 tablet  4     Past Medical History  Diagnosis Date  . Tobacco abuse   . Hypertension   . COPD (chronic obstructive pulmonary disease)   . Mitral regurgitation     Mild to moderate. Normal EF 09/2010  . Atrial fibrillation     chronic. Was first diagnosed in her early 42s.     No past surgical history on file.   No family history on file.   History   Social History  . Marital Status: Single    Spouse Name: N/A    Number of Children: N/A  . Years of Education: N/A   Occupational History  . Not on file.   Social History  Main Topics  . Smoking status: Former Smoker    Types: Cigarettes    Quit date: 10/14/2010  . Smokeless tobacco: Never Used   Comment: just quit since getting out of hospital   . Alcohol Use: Not on file  . Drug Use: Not on file  . Sexually Active: Not on file   Other Topics Concern  . Not on file   Social History Narrative  . No narrative on file     PHYSICAL EXAM   BP 123/88  Pulse 87  Ht 5\' 2"  (1.575 m)  Wt 194 lb (87.998 kg)  BMI 35.48 kg/m2  SpO2 97%  Constitutional: She is oriented to person, place, and time. She appears well-developed and well-nourished. Mild respiratory distress.  HENT: No nasal discharge.  Head: Normocephalic and atraumatic.  Eyes: Pupils are equal and round. Right eye exhibits no discharge. Left eye exhibits no discharge.  Neck: Normal range of motion. Neck supple. No JVD present. No thyromegaly present.  Cardiovascular: Normal rate, irregular rhythm, normal heart sounds. Exam reveals no gallop and no friction rub. No murmur heard.  Pulmonary/Chest: Mildly tachypneic. No stridor. Mild respiratory distress. She has extensive expiratory wheezes. She has no rales. She exhibits no tenderness.  Abdominal: Soft. Bowel sounds are normal. She exhibits no distension. There is no tenderness. There is no rebound and no guarding.  Musculoskeletal: Normal range of motion. She exhibits no edema and no tenderness.  Neurological: She is alert and oriented to person, place, and time. Coordination normal.  Skin: Skin is warm and dry. No rash noted. She is not diaphoretic. No erythema. No pallor.  Psychiatric: She has a normal mood and affect. Her behavior is normal. Judgment and thought content normal.      ASSESSMENT AND PLAN

## 2011-03-03 NOTE — Assessment & Plan Note (Signed)
The patient appears to have a COPD exacerbation with possible bronchitis. She has extensive expiratory wheezing. Unfortunately, she continues to smoke. I will give her Medrol dose pack as well as Levaquin 500 mg once daily for 5 days. If her symptoms do not improve or worsen, I advised her to go to the emergency room.

## 2011-04-14 ENCOUNTER — Ambulatory Visit (INDEPENDENT_AMBULATORY_CARE_PROVIDER_SITE_OTHER): Payer: Medicare Other | Admitting: Cardiovascular Disease

## 2011-04-14 ENCOUNTER — Encounter: Payer: Self-pay | Admitting: *Deleted

## 2011-04-14 ENCOUNTER — Telehealth: Payer: Self-pay | Admitting: *Deleted

## 2011-04-14 ENCOUNTER — Encounter: Payer: Self-pay | Admitting: Cardiovascular Disease

## 2011-04-14 VITALS — BP 110/74 | HR 68 | Ht 62.0 in | Wt 195.0 lb

## 2011-04-14 DIAGNOSIS — I10 Essential (primary) hypertension: Secondary | ICD-10-CM

## 2011-04-14 DIAGNOSIS — R0600 Dyspnea, unspecified: Secondary | ICD-10-CM

## 2011-04-14 DIAGNOSIS — Z720 Tobacco use: Secondary | ICD-10-CM

## 2011-04-14 DIAGNOSIS — R0602 Shortness of breath: Secondary | ICD-10-CM

## 2011-04-14 DIAGNOSIS — R0989 Other specified symptoms and signs involving the circulatory and respiratory systems: Secondary | ICD-10-CM

## 2011-04-14 DIAGNOSIS — F17200 Nicotine dependence, unspecified, uncomplicated: Secondary | ICD-10-CM

## 2011-04-14 NOTE — Assessment & Plan Note (Signed)
The patient quit smoking recently. She was congratulated. She has gained some weight. I instructed her to start exercising more once we get the results of stress test.

## 2011-04-14 NOTE — Progress Notes (Signed)
HPI   this is a 61 year old female who is here today for a followup visit. She has chronic atrial fibrillation on rate control as well as anticoagulation. She has known history of severe COPD. During her last visit, she had significant wheezing and dyspnea. I gave her Medrol Dosepak and Levaquin due to suspected bronchitis. Her symptoms improved and she actually quit smoking since then. She feels much better now. She continues to complain of dyspnea with minimal activities. She denies any palpitations. She denies any bleeding complications with Xarelto.   Allergies  Allergen Reactions  . Aspirin Other (See Comments)    Bleeding at higher doses     Current Outpatient Prescriptions on File Prior to Visit  Medication Sig Dispense Refill  . albuterol (ACCUNEB) 0.63 MG/3ML nebulizer solution Take 1 ampule by nebulization every 6 (six) hours as needed.       . diltiazem (CARTIA XT) 240 MG 24 hr capsule Take 1 capsule (240 mg total) by mouth daily.  30 capsule  6  . docusate sodium (COLACE) 100 MG capsule Take 100 mg by mouth 2 (two) times daily as needed.        . Fluticasone-Salmeterol (ADVAIR) 250-50 MCG/DOSE AEPB Inhale 1 puff into the lungs every 12 (twelve) hours.        Marland Kitchen olmesartan-hydrochlorothiazide (BENICAR HCT) 40-12.5 MG per tablet Take 1 tablet by mouth daily.        . pregabalin (LYRICA) 75 MG capsule Take 75 mg by mouth daily.       . Rivaroxaban (XARELTO) 20 MG TABS Take 20 mg by mouth daily.  30 tablet  4     Past Medical History  Diagnosis Date  . Tobacco abuse   . Hypertension   . COPD (chronic obstructive pulmonary disease)   . Mitral regurgitation     Mild to moderate. Normal EF 09/2010  . Atrial fibrillation     chronic. Was first diagnosed in her early 5s.     No past surgical history on file.   No family history on file.   History   Social History  . Marital Status: Single    Spouse Name: N/A    Number of Children: N/A  . Years of Education: N/A     Occupational History  . Not on file.   Social History Main Topics  . Smoking status: Former Smoker -- 1.0 packs/day for 40 years    Types: Cigarettes    Quit date: 10/14/2010  . Smokeless tobacco: Never Used   Comment: just quit since getting out of hospital   . Alcohol Use: Not on file  . Drug Use: Not on file  . Sexually Active: Not on file   Other Topics Concern  . Not on file   Social History Narrative  . No narrative on file     PHYSICAL EXAM   BP 110/74  Pulse 68  Ht 5\' 2"  (1.575 m)  Wt 195 lb (88.451 kg)  BMI 35.67 kg/m2  SpO2 98%  Constitutional: She is oriented to person, place, and time. She appears well-developed and well-nourished. No distress.  HENT: No nasal discharge.  Head: Normocephalic and atraumatic.  Eyes: Pupils are equal and round. Right eye exhibits no discharge. Left eye exhibits no discharge.  Neck: Normal range of motion. Neck supple. No JVD present. No thyromegaly present.  Cardiovascular: Normal rate, regular rhythm, normal heart sounds. Exam reveals no gallop and no friction rub. No murmur heard.  Pulmonary/Chest: Effort normal  and  diminished breath sounds. No stridor. No respiratory distress. She has no wheezes. She has no rales. She exhibits no tenderness.  Abdominal: Soft. Bowel sounds are normal. She exhibits no distension. There is no tenderness. There is no rebound and no guarding.  Musculoskeletal: Normal range of motion. She exhibits no edema and no tenderness.  Neurological: She is alert and oriented to person, place, and time. Coordination normal.  Skin: Skin is warm and dry. No rash noted. She is not diaphoretic. No erythema. No pallor.  Psychiatric: She has a normal mood and affect. Her behavior is normal. Judgment and thought content normal.      ASSESSMENT AND PLAN

## 2011-04-14 NOTE — Assessment & Plan Note (Signed)
This is likely multifactorial and I suspect that most of this is due to COPD. However, due to  Had extensive smoking history and other risk factors, I recommend a pharmacologic nuclear stress test to rule out underlying ischemia. The patient is not able to exercise on a treadmill.

## 2011-04-14 NOTE — Assessment & Plan Note (Signed)
Her heart rate is well controlled on current dose of diltiazem. She is tolerating anticoagulation with Xarelto without any reported side effects.  she will need a CBC and basic metabolic profile in the next 6 months.

## 2011-04-14 NOTE — Telephone Encounter (Signed)
No precert required 

## 2011-04-14 NOTE — Telephone Encounter (Signed)
Lexiscan Myoview scheduled for 04-16-11 @ [facility] Checking percert

## 2011-04-14 NOTE — Assessment & Plan Note (Signed)
Her blood pressure is well controlled. Continue current medications. 

## 2011-04-14 NOTE — Patient Instructions (Signed)
Your physician wants you to follow-up in: 6 months. You will receive a reminder letter in the mail one-two months in advance. If you don't receive a letter, please call our office to schedule the follow-up appointment. Your physician recommends that you continue on your current medications as directed. Please refer to the Current Medication list given to you today. Your physician has requested that you have a lexiscan myoview. For further information please visit [email or web address]. Please follow instruction sheet, as given. If the results of your test are normal or stable, you will receive a letter. If they are abnormal, the nurse will contact you by phone.  

## 2011-04-16 DIAGNOSIS — R0602 Shortness of breath: Secondary | ICD-10-CM

## 2011-04-22 ENCOUNTER — Telehealth: Payer: Self-pay | Admitting: *Deleted

## 2011-04-22 NOTE — Telephone Encounter (Signed)
Pt notified of results and verbalized understanding. She will contact the office for continued or worsening chest pain.

## 2011-04-22 NOTE — Telephone Encounter (Signed)
Message copied by Arlyss Gandy on Tue Apr 22, 2011 11:03 AM ------      Message from: Prescott Parma C      Created: Mon Apr 21, 2011  5:12 PM       Myoview revealed small, mild AS ischemia; EF 52%. Overall low risk study. Recommend early f/u visit to discuss results and consider possible cardiac cath, if pt continues to experience CP.

## 2011-07-22 ENCOUNTER — Other Ambulatory Visit: Payer: Self-pay | Admitting: Cardiovascular Disease

## 2011-10-09 ENCOUNTER — Telehealth: Payer: Self-pay | Admitting: Dermatopathology

## 2011-10-09 NOTE — Telephone Encounter (Signed)
Patient has a question regarding upcoming surgery and medications.  Please call.

## 2011-10-09 NOTE — Telephone Encounter (Signed)
Patient having hip surgery by Dr. Ophelia Charter w/[facility], and wanted to know about holding xarelto. Nurse advised Mickie Bail, aid for patient , to have their office contact us directly about this request.

## 2011-10-13 ENCOUNTER — Telehealth: Payer: Self-pay

## 2011-10-14 ENCOUNTER — Ambulatory Visit (INDEPENDENT_AMBULATORY_CARE_PROVIDER_SITE_OTHER): Payer: Medicare Other | Admitting: Physician Assistant

## 2011-10-14 ENCOUNTER — Encounter (HOSPITAL_COMMUNITY): Payer: Self-pay | Admitting: Pharmacy Technician

## 2011-10-14 ENCOUNTER — Encounter: Payer: Self-pay | Admitting: Physician Assistant

## 2011-10-14 VITALS — BP 118/74 | HR 78 | Ht 63.0 in | Wt 194.0 lb

## 2011-10-14 DIAGNOSIS — I4891 Unspecified atrial fibrillation: Secondary | ICD-10-CM

## 2011-10-14 DIAGNOSIS — Z01810 Encounter for preprocedural cardiovascular examination: Secondary | ICD-10-CM

## 2011-10-14 DIAGNOSIS — I10 Essential (primary) hypertension: Secondary | ICD-10-CM

## 2011-10-14 NOTE — Progress Notes (Signed)
Primary Cardiologist: Mady Gemma, MD   HPI: Patient presents for preoperative cardiac clearance, and in followup of an abnormal stress Myoview study, ordered by Dr. Kirke Corin at time of last OV, back in March. The study revealed a small area of mild AS ischemia; EF 52%. Dr. Kirke Corin felt that this represented an overall low risk study. And of note, she presents with no known history of CAD.  The patient states that her hip replacement surgery is scheduled for this coming Friday, September 20, at [facility].  Clinically, she denies any interim development, or prior history, of exertional chest discomfort. She also denies any tachycardia palpitations. Of note, she was instructed to hold her Xarelto 7 days prior to surgery, and has already done so.  Allergies  Allergen Reactions  . Aspirin Other (See Comments)    Bleeding at higher doses    Current Outpatient Prescriptions  Medication Sig Dispense Refill  . albuterol (ACCUNEB) 0.63 MG/3ML nebulizer solution Take 1 ampule by nebulization every 6 (six) hours as needed.       Marland Kitchen allopurinol (ZYLOPRIM) 100 MG tablet Take 100 mg by mouth daily.      Marland Kitchen diltiazem (CARTIA XT) 240 MG 24 hr capsule Take 1 capsule (240 mg total) by mouth daily.  30 capsule  6  . docusate sodium (COLACE) 100 MG capsule Take 100 mg by mouth 2 (two) times daily as needed.        . Fluticasone-Salmeterol (ADVAIR) 250-50 MCG/DOSE AEPB Inhale 1 puff into the lungs every 12 (twelve) hours.        Marland Kitchen ibuprofen (ADVIL,MOTRIN) 800 MG tablet Take 800 mg by mouth 2 (two) times daily.      Marland Kitchen olmesartan-hydrochlorothiazide (BENICAR HCT) 40-12.5 MG per tablet Take 1 tablet by mouth daily.        . pregabalin (LYRICA) 75 MG capsule Take 75 mg by mouth as needed.       Carlena Hurl 20 MG TABS TAKE 1 TABLET BY MOUTH DAILY.  30 tablet  6    Past Medical History  Diagnosis Date  . Tobacco abuse   . Hypertension   . COPD (chronic obstructive pulmonary disease)   . Mitral  regurgitation     Mild to moderate. Normal EF 09/2010  . Atrial fibrillation     chronic. Was first diagnosed in her early 32s.    No past surgical history on file.  History   Social History  . Marital Status: Single    Spouse Name: N/A    Number of Children: N/A  . Years of Education: N/A   Occupational History  . Not on file.   Social History Main Topics  . Smoking status: Former Smoker -- 1.0 packs/day for 40 years    Types: Cigarettes    Quit date: 10/14/2010  . Smokeless tobacco: Never Used   Comment: just quit since getting out of hospital   . Alcohol Use: Not on file  . Drug Use: Not on file  . Sexually Active: Not on file   Other Topics Concern  . Not on file   Social History Narrative  . No narrative on file    No family history on file.  ROS: no nausea, vomiting; no fever, chills; no melena, hematochezia; no claudication  PHYSICAL EXAM: BP 118/74  Pulse 78  Ht 5\' 3"  (1.6 m)  Wt 194 lb (87.998 kg)  BMI 34.37 kg/m2 GENERAL: 61 year old female; NAD HEENT: NCAT, PERRLA, EOMI; sclera clear; no xanthelasma NECK: palpable  bilateral carotid pulses, no bruits; no JVD; no TM LUNGS: CTA bilaterally CARDIAC: irregularly irregular (S1, S2); no significant murmurs; no rubs or gallops ABDOMEN: soft, non-tender; intact BS EXTREMETIES: intact distal pulses; no significant peripheral edema SKIN: warm/dry; no obvious rash/lesions MUSCULOSKELETAL: no joint deformity NEURO: no focal deficit; NL affect   EKG: reviewed and available in Electronic Records   ASSESSMENT & PLAN:  Preop cardiovascular exam No further cardiac workup is indicated. Patient had an overall low risk Myoview study back in March of this year, and presents with no complaints of exertional chest discomfort. Therefore, she is deemed to be an acceptable risk to proceed with hip surgery scheduled for this coming Friday, September 20, at [facility].  Atrial fibrillation Patient remains in  atrial fibrillation, as documented by current EKG, and is adequately rate controlled on current regimen of diltiazem. Xarelto has been placed on hold, in preparation for surgery later this week, and is to be subsequently resumed, once cleared from a surgical standpoint.  Hypertension Well-controlled on current medication regimen    Gene Kamilah Correia, PAC

## 2011-10-14 NOTE — Assessment & Plan Note (Signed)
No further cardiac workup is indicated. Patient had an overall low risk Myoview study back in March of this year, and presents with no complaints of exertional chest discomfort. Therefore, Tracy Johnston is deemed to be an acceptable risk to proceed with hip surgery scheduled for this coming Friday, September 20, at [facility].

## 2011-10-14 NOTE — Patient Instructions (Signed)
Continue all current medications. Patient is cleared for hip surgery Your physician wants you to follow up in: 6 months.  You will receive a reminder letter in the mail one-two months in advance.  If you don't receive a letter, please call our office to schedule the follow up appointment

## 2011-10-14 NOTE — Assessment & Plan Note (Signed)
Well-controlled on current medication regimen 

## 2011-10-14 NOTE — Assessment & Plan Note (Signed)
Patient remains in atrial fibrillation, as documented by current EKG, and is adequately rate controlled on current regimen of diltiazem. Xarelto has been placed on hold, in preparation for surgery later this week, and is to be subsequently resumed, once cleared from a surgical standpoint.

## 2011-10-15 ENCOUNTER — Other Ambulatory Visit (HOSPITAL_COMMUNITY): Payer: Self-pay | Admitting: Orthopaedic Surgery

## 2011-10-16 ENCOUNTER — Encounter (HOSPITAL_COMMUNITY): Payer: Self-pay | Admitting: *Deleted

## 2011-10-16 MED ORDER — CEFAZOLIN SODIUM-DEXTROSE 2-3 GM-% IV SOLR
2.0000 g | INTRAVENOUS | Status: AC
  Administered 2011-10-17: 2 g via INTRAVENOUS
  Filled 2011-10-16: qty 50

## 2011-10-17 ENCOUNTER — Inpatient Hospital Stay (HOSPITAL_COMMUNITY): Payer: PRIVATE HEALTH INSURANCE | Admitting: Anesthesiology

## 2011-10-17 ENCOUNTER — Encounter (HOSPITAL_COMMUNITY): Payer: Self-pay | Admitting: Anesthesiology

## 2011-10-17 ENCOUNTER — Inpatient Hospital Stay (HOSPITAL_COMMUNITY): Payer: PRIVATE HEALTH INSURANCE

## 2011-10-17 ENCOUNTER — Inpatient Hospital Stay (HOSPITAL_COMMUNITY)
Admission: RE | Admit: 2011-10-17 | Discharge: 2011-10-20 | DRG: 470 | Disposition: A | Discharge status: Home Health | Payer: PRIVATE HEALTH INSURANCE | Source: Ambulatory Visit | Attending: Orthopaedic Surgery | Admitting: Orthopaedic Surgery

## 2011-10-17 ENCOUNTER — Encounter (HOSPITAL_COMMUNITY)
Admission: RE | Disposition: A | Discharge status: Home Health | Payer: Self-pay | Source: Ambulatory Visit | Attending: Orthopaedic Surgery

## 2011-10-17 ENCOUNTER — Encounter (HOSPITAL_COMMUNITY): Payer: Self-pay | Admitting: *Deleted

## 2011-10-17 DIAGNOSIS — Z87891 Personal history of nicotine dependence: Secondary | ICD-10-CM

## 2011-10-17 DIAGNOSIS — Z79899 Other long term (current) drug therapy: Secondary | ICD-10-CM

## 2011-10-17 DIAGNOSIS — D62 Acute posthemorrhagic anemia: Secondary | ICD-10-CM | POA: Diagnosis not present

## 2011-10-17 DIAGNOSIS — J4489 Other specified chronic obstructive pulmonary disease: Secondary | ICD-10-CM | POA: Diagnosis present

## 2011-10-17 DIAGNOSIS — M87052 Idiopathic aseptic necrosis of left femur: Secondary | ICD-10-CM

## 2011-10-17 DIAGNOSIS — M87059 Idiopathic aseptic necrosis of unspecified femur: Principal | ICD-10-CM | POA: Diagnosis present

## 2011-10-17 DIAGNOSIS — I4891 Unspecified atrial fibrillation: Secondary | ICD-10-CM | POA: Diagnosis present

## 2011-10-17 DIAGNOSIS — G4700 Insomnia, unspecified: Secondary | ICD-10-CM | POA: Diagnosis present

## 2011-10-17 DIAGNOSIS — J449 Chronic obstructive pulmonary disease, unspecified: Secondary | ICD-10-CM | POA: Diagnosis present

## 2011-10-17 DIAGNOSIS — Z7901 Long term (current) use of anticoagulants: Secondary | ICD-10-CM

## 2011-10-17 DIAGNOSIS — Z23 Encounter for immunization: Secondary | ICD-10-CM

## 2011-10-17 DIAGNOSIS — I10 Essential (primary) hypertension: Secondary | ICD-10-CM | POA: Diagnosis present

## 2011-10-17 DIAGNOSIS — Z886 Allergy status to analgesic agent status: Secondary | ICD-10-CM

## 2011-10-17 DIAGNOSIS — I059 Rheumatic mitral valve disease, unspecified: Secondary | ICD-10-CM | POA: Diagnosis present

## 2011-10-17 DIAGNOSIS — Z8541 Personal history of malignant neoplasm of cervix uteri: Secondary | ICD-10-CM

## 2011-10-17 HISTORY — DX: Insomnia, unspecified: G47.00

## 2011-10-17 HISTORY — PX: TOTAL HIP ARTHROPLASTY: SHX124

## 2011-10-17 HISTORY — DX: Malignant (primary) neoplasm, unspecified: C80.1

## 2011-10-17 LAB — COMPREHENSIVE METABOLIC PANEL
ALT: 12 U/L (ref 0–35)
AST: 14 U/L (ref 0–37)
Albumin: 3.5 g/dL (ref 3.5–5.2)
Alkaline Phosphatase: 91 U/L (ref 39–117)
Glucose, Bld: 90 mg/dL (ref 70–99)
Potassium: 3.6 mEq/L (ref 3.5–5.1)
Sodium: 142 mEq/L (ref 135–145)
Total Protein: 6.9 g/dL (ref 6.0–8.3)

## 2011-10-17 LAB — CBC
Hemoglobin: 14.2 g/dL (ref 12.0–15.0)
MCHC: 34.9 g/dL (ref 30.0–36.0)
Platelets: 279 10*3/uL (ref 150–400)
RDW: 14.2 % (ref 11.5–15.5)

## 2011-10-17 LAB — TYPE AND SCREEN
ABO/RH(D): O NEG
Antibody Screen: NEGATIVE

## 2011-10-17 LAB — SURGICAL PCR SCREEN
MRSA, PCR: NEGATIVE
Staphylococcus aureus: NEGATIVE

## 2011-10-17 LAB — APTT: aPTT: 29 seconds (ref 24–37)

## 2011-10-17 SURGERY — ARTHROPLASTY, HIP, TOTAL,POSTERIOR APPROACH
Anesthesia: General | Site: Hip | Laterality: Left | Wound class: Clean

## 2011-10-17 MED ORDER — HYDROMORPHONE HCL PF 1 MG/ML IJ SOLN
INTRAMUSCULAR | Status: AC
  Filled 2011-10-17: qty 2

## 2011-10-17 MED ORDER — ACETAMINOPHEN 10 MG/ML IV SOLN: 1000.0000 mg | Freq: Once | INTRAVENOUS | Status: DC | PRN

## 2011-10-17 MED ORDER — SENNOSIDES-DOCUSATE SODIUM 8.6-50 MG PO TABS: 1.0000 | ORAL_TABLET | Freq: Every evening | ORAL | Status: DC | PRN

## 2011-10-17 MED ORDER — HYDROMORPHONE HCL PF 1 MG/ML IJ SOLN: 0.2500 mg | INTRAMUSCULAR | Status: DC | PRN

## 2011-10-17 MED ORDER — CEFAZOLIN SODIUM-DEXTROSE 2-3 GM-% IV SOLR
2.0000 g | Freq: Four times a day (QID) | INTRAVENOUS | Status: AC
  Administered 2011-10-17 – 2011-10-18 (×2): 2 g via INTRAVENOUS
  Filled 2011-10-17 (×2): qty 50

## 2011-10-17 MED ORDER — IRBESARTAN 300 MG PO TABS
300.0000 mg | ORAL_TABLET | Freq: Every day | ORAL | Status: DC
  Administered 2011-10-18 – 2011-10-20 (×3): 300 mg via ORAL
  Filled 2011-10-17 (×4): qty 1

## 2011-10-17 MED ORDER — OLMESARTAN MEDOXOMIL-HCTZ 40-12.5 MG PO TABS: 1.0000 | ORAL_TABLET | Freq: Every day | ORAL | Status: DC

## 2011-10-17 MED ORDER — FENTANYL CITRATE 0.05 MG/ML IJ SOLN
INTRAMUSCULAR | Status: DC | PRN
  Administered 2011-10-17: 150 ug via INTRAVENOUS
  Administered 2011-10-17: 50 ug via INTRAVENOUS
  Administered 2011-10-17 (×2): 100 ug via INTRAVENOUS

## 2011-10-17 MED ORDER — ALLOPURINOL 100 MG PO TABS
100.0000 mg | ORAL_TABLET | Freq: Every day | ORAL | Status: DC
  Administered 2011-10-18 – 2011-10-20 (×3): 100 mg via ORAL
  Filled 2011-10-17 (×4): qty 1

## 2011-10-17 MED ORDER — ALBUTEROL SULFATE (5 MG/ML) 0.5% IN NEBU
2.5000 mg | INHALATION_SOLUTION | Freq: Four times a day (QID) | RESPIRATORY_TRACT | Status: DC | PRN
  Administered 2011-10-17: 2.5 mg via RESPIRATORY_TRACT
  Filled 2011-10-17: qty 0.5

## 2011-10-17 MED ORDER — MIDAZOLAM HCL 5 MG/5ML IJ SOLN
INTRAMUSCULAR | Status: DC | PRN
  Administered 2011-10-17: 1 mg via INTRAVENOUS

## 2011-10-17 MED ORDER — 0.9 % SODIUM CHLORIDE (POUR BTL) OPTIME
TOPICAL | Status: DC | PRN
  Administered 2011-10-17: 1000 mL

## 2011-10-17 MED ORDER — RIVAROXABAN 20 MG PO TABS
20.0000 mg | ORAL_TABLET | Freq: Every day | ORAL | Status: DC
  Administered 2011-10-17 – 2011-10-19 (×3): 20 mg via ORAL
  Filled 2011-10-17 (×4): qty 1

## 2011-10-17 MED ORDER — ACETAMINOPHEN 10 MG/ML IV SOLN
INTRAVENOUS | Status: DC | PRN
  Administered 2011-10-17: 1000 mg via INTRAVENOUS

## 2011-10-17 MED ORDER — ACETAMINOPHEN 10 MG/ML IV SOLN
INTRAVENOUS | Status: AC
  Filled 2011-10-17: qty 100

## 2011-10-17 MED ORDER — ONDANSETRON HCL 4 MG/2ML IJ SOLN: 4.0000 mg | Freq: Once | INTRAMUSCULAR | Status: DC | PRN

## 2011-10-17 MED ORDER — MUPIROCIN 2 % EX OINT
TOPICAL_OINTMENT | CUTANEOUS | Status: AC
  Administered 2011-10-17: 1 via NASAL
  Filled 2011-10-17: qty 22

## 2011-10-17 MED ORDER — DIPHENHYDRAMINE HCL 12.5 MG/5ML PO ELIX: 12.5000 mg | ORAL_SOLUTION | Freq: Four times a day (QID) | ORAL | Status: DC | PRN

## 2011-10-17 MED ORDER — VECURONIUM BROMIDE 10 MG IV SOLR
INTRAVENOUS | Status: DC | PRN
  Administered 2011-10-17: 3 mg via INTRAVENOUS

## 2011-10-17 MED ORDER — FLEET ENEMA 7-19 GM/118ML RE ENEM: 1.0000 | ENEMA | Freq: Once | RECTAL | Status: AC | PRN

## 2011-10-17 MED ORDER — ZOLPIDEM TARTRATE 5 MG PO TABS: 5.0000 mg | ORAL_TABLET | Freq: Every evening | ORAL | Status: DC | PRN

## 2011-10-17 MED ORDER — METHOCARBAMOL 100 MG/ML IJ SOLN
500.0000 mg | Freq: Four times a day (QID) | INTRAVENOUS | Status: DC | PRN
  Filled 2011-10-17: qty 5

## 2011-10-17 MED ORDER — DIPHENHYDRAMINE HCL 50 MG/ML IJ SOLN: 12.5000 mg | Freq: Four times a day (QID) | INTRAMUSCULAR | Status: DC | PRN

## 2011-10-17 MED ORDER — LIDOCAINE HCL (CARDIAC) 20 MG/ML IV SOLN
INTRAVENOUS | Status: DC | PRN
  Administered 2011-10-17: 100 mg via INTRAVENOUS

## 2011-10-17 MED ORDER — SODIUM CHLORIDE 0.9 % IJ SOLN: 9.0000 mL | INTRAMUSCULAR | Status: DC | PRN

## 2011-10-17 MED ORDER — OXYCODONE HCL 5 MG PO TABS
5.0000 mg | ORAL_TABLET | ORAL | Status: DC | PRN
  Administered 2011-10-19 – 2011-10-20 (×4): 10 mg via ORAL
  Administered 2011-10-20 (×2): 5 mg via ORAL
  Administered 2011-10-20: 10 mg via ORAL
  Filled 2011-10-17 (×6): qty 2

## 2011-10-17 MED ORDER — MUPIROCIN 2 % EX OINT
TOPICAL_OINTMENT | Freq: Two times a day (BID) | CUTANEOUS | Status: DC
  Administered 2011-10-17: 22:00:00 via NASAL
  Administered 2011-10-17: 1 via NASAL
  Administered 2011-10-18 – 2011-10-20 (×5): via NASAL
  Filled 2011-10-17: qty 22

## 2011-10-17 MED ORDER — ALBUTEROL SULFATE 0.63 MG/3ML IN NEBU: 1.0000 | INHALATION_SOLUTION | Freq: Four times a day (QID) | RESPIRATORY_TRACT | Status: DC | PRN

## 2011-10-17 MED ORDER — ONDANSETRON HCL 4 MG/2ML IJ SOLN: 4.0000 mg | Freq: Four times a day (QID) | INTRAMUSCULAR | Status: DC | PRN

## 2011-10-17 MED ORDER — RIVAROXABAN 10 MG PO TABS: 10.0000 mg | ORAL_TABLET | Freq: Every day | ORAL | Status: DC

## 2011-10-17 MED ORDER — NEOSTIGMINE METHYLSULFATE 1 MG/ML IJ SOLN
INTRAMUSCULAR | Status: DC | PRN
  Administered 2011-10-17: 5 mg via INTRAVENOUS

## 2011-10-17 MED ORDER — PHENOL 1.4 % MT LIQD: 1.0000 | OROMUCOSAL | Status: DC | PRN

## 2011-10-17 MED ORDER — BISACODYL 10 MG RE SUPP: 10.0000 mg | Freq: Every day | RECTAL | Status: DC | PRN

## 2011-10-17 MED ORDER — LACTATED RINGERS IV SOLN
INTRAVENOUS | Status: DC
  Administered 2011-10-17 (×2): via INTRAVENOUS

## 2011-10-17 MED ORDER — DIPHENHYDRAMINE HCL 12.5 MG/5ML PO ELIX: 12.5000 mg | ORAL_SOLUTION | ORAL | Status: DC | PRN

## 2011-10-17 MED ORDER — ACETAMINOPHEN 650 MG RE SUPP: 650.0000 mg | Freq: Four times a day (QID) | RECTAL | Status: DC | PRN

## 2011-10-17 MED ORDER — KCL IN DEXTROSE-NACL 20-5-0.45 MEQ/L-%-% IV SOLN
INTRAVENOUS | Status: DC
  Administered 2011-10-17 – 2011-10-18 (×2): via INTRAVENOUS
  Filled 2011-10-17 (×7): qty 1000

## 2011-10-17 MED ORDER — METOCLOPRAMIDE HCL 5 MG/ML IJ SOLN: 5.0000 mg | Freq: Three times a day (TID) | INTRAMUSCULAR | Status: DC | PRN

## 2011-10-17 MED ORDER — MORPHINE SULFATE (PF) 1 MG/ML IV SOLN
INTRAVENOUS | Status: AC
  Filled 2011-10-17: qty 25

## 2011-10-17 MED ORDER — ROCURONIUM BROMIDE 100 MG/10ML IV SOLN
INTRAVENOUS | Status: DC | PRN
  Administered 2011-10-17: 50 mg via INTRAVENOUS

## 2011-10-17 MED ORDER — NALOXONE HCL 0.4 MG/ML IJ SOLN: 0.4000 mg | INTRAMUSCULAR | Status: DC | PRN

## 2011-10-17 MED ORDER — DEXAMETHASONE SODIUM PHOSPHATE 4 MG/ML IJ SOLN
INTRAMUSCULAR | Status: DC | PRN
  Administered 2011-10-17: 4 mg via INTRAVENOUS

## 2011-10-17 MED ORDER — ONDANSETRON HCL 4 MG PO TABS: 4.0000 mg | ORAL_TABLET | Freq: Four times a day (QID) | ORAL | Status: DC | PRN

## 2011-10-17 MED ORDER — MORPHINE SULFATE (PF) 1 MG/ML IV SOLN
INTRAVENOUS | Status: DC
  Administered 2011-10-17: 4.5 mg via INTRAVENOUS
  Administered 2011-10-17: 23 mL via INTRAVENOUS
  Administered 2011-10-18: 4.5 mg via INTRAVENOUS
  Administered 2011-10-18: 11:00:00 via INTRAVENOUS
  Administered 2011-10-18: 10.5 mg via INTRAVENOUS
  Administered 2011-10-18: 3 mg via INTRAVENOUS
  Administered 2011-10-18: 4.5 mg via INTRAVENOUS
  Administered 2011-10-19: 02:00:00 via INTRAVENOUS
  Administered 2011-10-19: 6 mg via INTRAVENOUS
  Filled 2011-10-17 (×2): qty 25

## 2011-10-17 MED ORDER — METHOCARBAMOL 500 MG PO TABS
500.0000 mg | ORAL_TABLET | Freq: Four times a day (QID) | ORAL | Status: DC | PRN
  Administered 2011-10-18 – 2011-10-20 (×2): 500 mg via ORAL
  Filled 2011-10-17 (×2): qty 1

## 2011-10-17 MED ORDER — METOCLOPRAMIDE HCL 10 MG PO TABS: 5.0000 mg | ORAL_TABLET | Freq: Three times a day (TID) | ORAL | Status: DC | PRN

## 2011-10-17 MED ORDER — FLUTICASONE-SALMETEROL 250-50 MCG/DOSE IN AEPB
1.0000 | INHALATION_SPRAY | Freq: Two times a day (BID) | RESPIRATORY_TRACT | Status: DC
  Administered 2011-10-18 – 2011-10-20 (×5): 1 via RESPIRATORY_TRACT
  Filled 2011-10-17: qty 14

## 2011-10-17 MED ORDER — DOCUSATE SODIUM 100 MG PO CAPS
100.0000 mg | ORAL_CAPSULE | Freq: Two times a day (BID) | ORAL | Status: DC
  Administered 2011-10-17 – 2011-10-20 (×6): 100 mg via ORAL
  Filled 2011-10-17 (×7): qty 1

## 2011-10-17 MED ORDER — GLYCOPYRROLATE 0.2 MG/ML IJ SOLN
INTRAMUSCULAR | Status: DC | PRN
  Administered 2011-10-17: .8 mg via INTRAVENOUS

## 2011-10-17 MED ORDER — ACETAMINOPHEN 325 MG PO TABS: 650.0000 mg | ORAL_TABLET | Freq: Four times a day (QID) | ORAL | Status: DC | PRN

## 2011-10-17 MED ORDER — HYDROMORPHONE HCL PF 1 MG/ML IJ SOLN
0.2500 mg | INTRAMUSCULAR | Status: DC | PRN
  Administered 2011-10-17 (×4): 0.5 mg via INTRAVENOUS

## 2011-10-17 MED ORDER — ALBUTEROL SULFATE HFA 108 (90 BASE) MCG/ACT IN AERS
INHALATION_SPRAY | RESPIRATORY_TRACT | Status: DC | PRN
  Administered 2011-10-17: 2 via RESPIRATORY_TRACT

## 2011-10-17 MED ORDER — MENTHOL 3 MG MT LOZG: 1.0000 | LOZENGE | OROMUCOSAL | Status: DC | PRN

## 2011-10-17 MED ORDER — HYDROCHLOROTHIAZIDE 12.5 MG PO CAPS
12.5000 mg | ORAL_CAPSULE | Freq: Every day | ORAL | Status: DC
  Administered 2011-10-18 – 2011-10-20 (×3): 12.5 mg via ORAL
  Filled 2011-10-17 (×4): qty 1

## 2011-10-17 MED ORDER — DILTIAZEM HCL ER COATED BEADS 240 MG PO CP24
240.0000 mg | ORAL_CAPSULE | Freq: Every day | ORAL | Status: DC
  Administered 2011-10-18 – 2011-10-20 (×3): 240 mg via ORAL
  Filled 2011-10-17 (×4): qty 1

## 2011-10-17 MED ORDER — ONDANSETRON HCL 4 MG/2ML IJ SOLN
INTRAMUSCULAR | Status: DC | PRN
  Administered 2011-10-17: 4 mg via INTRAVENOUS

## 2011-10-17 SURGICAL SUPPLY — 70 items
APL SKNCLS STERI-STRIP NONHPOA (GAUZE/BANDAGES/DRESSINGS) ×1
BENZOIN TINCTURE PRP APPL 2/3 (GAUZE/BANDAGES/DRESSINGS) ×2 IMPLANT
BLADE SAW SAG 73X25 THK (BLADE) ×1
BLADE SAW SGTL 73X25 THK (BLADE) ×1 IMPLANT
BLADE SURG ROTATE 9660 (MISCELLANEOUS) IMPLANT
BRUSH FEMORAL CANAL (MISCELLANEOUS) IMPLANT
CLOTH BEACON ORANGE TIMEOUT ST (SAFETY) ×2 IMPLANT
COVER BACK TABLE 24X17X13 BIG (DRAPES) ×2 IMPLANT
DRAPE INCISE IOBAN 66X45 STRL (DRAPES) IMPLANT
DRAPE ORTHO SPLIT 77X108 STRL (DRAPES) ×4
DRAPE SURG ORHT 6 SPLT 77X108 (DRAPES) ×2 IMPLANT
DRAPE U-SHAPE 47X51 STRL (DRAPES) ×2 IMPLANT
DRILL BIT 7/64X5 (BIT) ×2 IMPLANT
DRSG PAD ABDOMINAL 8X10 ST (GAUZE/BANDAGES/DRESSINGS) ×3 IMPLANT
DURAPREP 26ML APPLICATOR (WOUND CARE) ×2 IMPLANT
ELECT CAUTERY BLADE 6.4 (BLADE) ×2 IMPLANT
ELECT REM PT RETURN 9FT ADLT (ELECTROSURGICAL) ×2
ELECTRODE REM PT RTRN 9FT ADLT (ELECTROSURGICAL) ×1 IMPLANT
EVACUATOR 1/8 PVC DRAIN (DRAIN) IMPLANT
FACESHIELD LNG OPTICON STERILE (SAFETY) ×4 IMPLANT
GAUZE XEROFORM 5X9 LF (GAUZE/BANDAGES/DRESSINGS) ×2 IMPLANT
GLOVE BIOGEL PI IND STRL 7.5 (GLOVE) ×1 IMPLANT
GLOVE BIOGEL PI IND STRL 8 (GLOVE) ×1 IMPLANT
GLOVE BIOGEL PI INDICATOR 7.5 (GLOVE) ×1
GLOVE BIOGEL PI INDICATOR 8 (GLOVE) ×1
GLOVE ECLIPSE 7.0 STRL STRAW (GLOVE) ×2 IMPLANT
GLOVE ORTHO TXT STRL SZ7.5 (GLOVE) ×2 IMPLANT
GOWN PREVENTION PLUS LG XLONG (DISPOSABLE) IMPLANT
GOWN PREVENTION PLUS XLARGE (GOWN DISPOSABLE) ×2 IMPLANT
GOWN STRL NON-REIN LRG LVL3 (GOWN DISPOSABLE) ×2 IMPLANT
HANDPIECE INTERPULSE COAX TIP (DISPOSABLE)
IMMOBILIZER KNEE 20 (SOFTGOODS)
IMMOBILIZER KNEE 20 THIGH 36 (SOFTGOODS) IMPLANT
IMMOBILIZER KNEE 22 UNIV (SOFTGOODS) IMPLANT
IMMOBILIZER KNEE 24 THIGH 36 (MISCELLANEOUS) IMPLANT
IMMOBILIZER KNEE 24 UNIV (MISCELLANEOUS)
KIT BASIN OR (CUSTOM PROCEDURE TRAY) ×2 IMPLANT
KIT ROOM TURNOVER OR (KITS) ×2 IMPLANT
MANIFOLD NEPTUNE II (INSTRUMENTS) ×2 IMPLANT
NDL 1/2 CIR MAYO (NEEDLE) ×1 IMPLANT
NDL HYPO 25GX1X1/2 BEV (NEEDLE) ×1 IMPLANT
NEEDLE 1/2 CIR MAYO (NEEDLE) ×2 IMPLANT
NEEDLE HYPO 25GX1X1/2 BEV (NEEDLE) ×2 IMPLANT
NEEDLE MAYO .5 CIRCLE (NEEDLE) ×2 IMPLANT
NS IRRIG 1000ML POUR BTL (IV SOLUTION) ×2 IMPLANT
PACK TOTAL JOINT (CUSTOM PROCEDURE TRAY) ×2 IMPLANT
PAD ARMBOARD 7.5X6 YLW CONV (MISCELLANEOUS) ×4 IMPLANT
PIN STEINMAN 3/16 (PIN) ×2 IMPLANT
PRESSURIZER FEMORAL UNIV (MISCELLANEOUS) IMPLANT
SET HNDPC FAN SPRY TIP SCT (DISPOSABLE) IMPLANT
SPONGE GAUZE 4X4 12PLY (GAUZE/BANDAGES/DRESSINGS) ×2 IMPLANT
SPONGE LAP 4X18 X RAY DECT (DISPOSABLE) ×4 IMPLANT
STAPLER VISISTAT 35W (STAPLE) ×2 IMPLANT
STRIP CLOSURE SKIN 1/2X4 (GAUZE/BANDAGES/DRESSINGS) ×2 IMPLANT
SUCTION FRAZIER TIP 10 FR DISP (SUCTIONS) ×2 IMPLANT
SUT ETHIBOND NAB CT1 #1 30IN (SUTURE) ×6 IMPLANT
SUT TICRON (SUTURE) ×4 IMPLANT
SUT VIC AB 0 CT1 27 (SUTURE)
SUT VIC AB 0 CT1 27XBRD ANBCTR (SUTURE) IMPLANT
SUT VIC AB 2-0 CT1 27 (SUTURE) ×4
SUT VIC AB 2-0 CT1 TAPERPNT 27 (SUTURE) ×2 IMPLANT
SUT VICRYL 0 TIES 12 18 (SUTURE) IMPLANT
SUT VICRYL 4-0 PS2 18IN ABS (SUTURE) ×2 IMPLANT
SYR CONTROL 10ML LL (SYRINGE) ×2 IMPLANT
TAPE CLOTH SURG 6X10 WHT LF (GAUZE/BANDAGES/DRESSINGS) ×1 IMPLANT
TOWEL OR 17X24 6PK STRL BLUE (TOWEL DISPOSABLE) ×2 IMPLANT
TOWEL OR 17X26 10 PK STRL BLUE (TOWEL DISPOSABLE) ×2 IMPLANT
TOWER CARTRIDGE SMART MIX (DISPOSABLE) IMPLANT
TRAY FOLEY CATH 14FR (SET/KITS/TRAYS/PACK) IMPLANT
WATER STERILE IRR 1000ML POUR (IV SOLUTION) ×8 IMPLANT

## 2011-10-17 NOTE — Interval H&P Note (Signed)
History and Physical Interval Note:  10/17/2011 12:27 PM  Tracy Johnston  has presented today for surgery, with the diagnosis of Left Hip AVN  The various methods of treatment have been discussed with the patient and family. After consideration of risks, benefits and other options for treatment, the patient has consented to  Procedure(s) (LRB) with comments: TOTAL HIP ARTHROPLASTY (Left) - Left Total Hip Arthroplasty as a surgical intervention .  The patient's history has been reviewed, patient examined, no change in status, stable for surgery.  I have reviewed the patient's chart and labs.  Questions were answered to the patient's satisfaction.     Tadan Shill C

## 2011-10-17 NOTE — H&P (Signed)
Tracy Johnston is an 61 y.o. female.   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  She has had IA steroid injection without adequate relief of her pain and has required IM narcotics in the ED for pain previously. Now unable to ambulate with severe pain in the left hip.  Pain is interfering with ADLs and pt wishes to proceed with left total hip arthroplasty.  Past Medical History  Diagnosis Date  . Tobacco abuse   . Hypertension   . COPD (chronic obstructive pulmonary disease)   . Mitral regurgitation     Mild to moderate. Normal EF 09/2010  . Atrial fibrillation     chronic. Was first diagnosed in her early 13s.  . Insomnia   . 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 Nephrectomy 2003  . Arm fracture     rods, pins and bone graft.- to left arm from hip  . Tubal ligation   . Bone graft hip iliac crest     to left arm    History reviewed. No pertinent family history. Social History:  reports that she quit smoking about a year ago. Her smoking use included Cigarettes. She has a 40 pack-year smoking history. She has never used smokeless tobacco. She reports that she does not drink alcohol or use illicit drugs.  Allergies:  Allergies  Allergen Reactions  . Aspirin Other (See Comments)    Bleeding at higher doses    Medications Prior to Admission  Medication Sig Dispense Refill  . albuterol (ACCUNEB) 0.63 MG/3ML nebulizer solution Take 1 ampule by nebulization every 6 (six) hours as needed. For shortness of breath      . allopurinol (ZYLOPRIM) 100 MG tablet Take 100 mg by mouth daily.      Marland Kitchen diltiazem (CARTIA XT) 240 MG 24 hr capsule Take 1 capsule (240 mg total) by mouth daily.  30 capsule  6  . docusate sodium (COLACE) 100 MG capsule Take 100 mg by mouth 2 (two) times daily.       . Fluticasone-Salmeterol (ADVAIR) 250-50 MCG/DOSE AEPB Inhale 1 puff into the lungs every 12 (twelve)  hours.        Marland Kitchen HYDROcodone-acetaminophen (NORCO/VICODIN) 5-325 MG per tablet Take 1-4 tablets by mouth every 6 (six) hours as needed. For pain      . ibuprofen (ADVIL,MOTRIN) 800 MG tablet Take 800 mg by mouth 2 (two) times daily.      Marland Kitchen olmesartan-hydrochlorothiazide (BENICAR HCT) 40-12.5 MG per tablet Take 1 tablet by mouth daily.        . Rivaroxaban (XARELTO) 20 MG TABS Take 20 mg by mouth daily.        No results found for this or any previous visit (from the past 48 hour(s)). No results found.  Review of Systems  Constitutional: Negative.   HENT: Negative.   Respiratory: Negative.   Cardiovascular: Negative.   Gastrointestinal: Negative.   Genitourinary: Negative.   Musculoskeletal: Positive for joint pain.  Skin: Negative.   Neurological: Negative.   Endo/Heme/Allergies: Negative.   Psychiatric/Behavioral: Negative.     Blood pressure 166/103, pulse 78, temperature 97.7 F (36.5 C), temperature source Oral, resp. rate 20, SpO2 98.00%. Physical Exam  Constitutional: She is oriented to person, place, and time. She appears well-developed and well-nourished.  HENT:  Head: Normocephalic and atraumatic.  Eyes: EOM are  normal.  Neck: Normal range of motion.  Cardiovascular: Normal rate.   Respiratory: Effort normal.  Musculoskeletal:       Painful ROM of left hip.  Pain with weight bearing to left hip. Distal pulse and sensation intact in lower extremities.  Neurological: She is alert and oriented to person, place, and time.  Skin: Skin is warm and dry.  Psychiatric: She has a normal mood and affect.     Assessment/Plan AVN Left hip PLAN:  Left hip arthroplasty  Amrit Cress M 10/17/2011, 10:42 AM

## 2011-10-17 NOTE — Anesthesia Postprocedure Evaluation (Signed)
  Anesthesia Post-op Note  Patient: Tracy Johnston  Procedure(s) Performed: Procedure(s) (LRB) with comments: TOTAL HIP ARTHROPLASTY (Left) - Left Total Hip Arthroplasty  Patient Location: PACU  Anesthesia Type: General  Level of Consciousness: awake, alert  and oriented  Airway and Oxygen Therapy: Patient Spontanous Breathing  Post-op Pain: none  Post-op Assessment: Post-op Vital signs reviewed  Post-op Vital Signs: Reviewed  Complications: No apparent anesthesia complications

## 2011-10-17 NOTE — Transfer of Care (Signed)
Immediate Anesthesia Transfer of Care Note  Patient: Tracy Johnston  Procedure(s) Performed: Procedure(s) (LRB) with comments: TOTAL HIP ARTHROPLASTY (Left) - Left Total Hip Arthroplasty  Patient Location: PACU  Anesthesia Type: General  Level of Consciousness: awake, alert  and oriented  Airway & Oxygen Therapy: Patient Spontanous Breathing and Patient connected to face mask oxygen  Post-op Assessment: Report given to PACU RN, Post -op Vital signs reviewed and stable and Patient moving all extremities X 4  Post vital signs: Reviewed and stable  Complications: No apparent anesthesia complications

## 2011-10-17 NOTE — Interval H&P Note (Deleted)
History and Physical Interval Note:  10/17/2011 12:28 PM  Tracy Johnston  has presented today for surgery, with the diagnosis of Left Hip AVN  The various methods of treatment have been discussed with the patient and family. After consideration of risks, benefits and other options for treatment, the patient has consented to  Procedure(s) (LRB) with comments: TOTAL HIP ARTHROPLASTY (Left) - Left Total Hip Arthroplasty as a surgical intervention .  The patient's history has been reviewed, patient examined, no change in status, stable for surgery.  I have reviewed the patient's chart and labs.  Questions were answered to the patient's satisfaction.     [name]

## 2011-10-17 NOTE — Anesthesia Preprocedure Evaluation (Addendum)
Anesthesia Evaluation  Patient identified by MRN, date of birth, ID band Patient awake    Reviewed: Allergy & Precautions, H&P , NPO status , Patient's Chart, lab work & pertinent test results  Airway Mallampati: II      Dental  (+) Edentulous Upper and Edentulous Lower   Pulmonary  breath sounds clear to auscultation        Cardiovascular Rhythm:Regular Rate:Normal     Neuro/Psych    GI/Hepatic   Endo/Other    Renal/GU      Musculoskeletal   Abdominal   Peds  Hematology   Anesthesia Other Findings   Reproductive/Obstetrics                           Anesthesia Physical Anesthesia Plan  ASA: III  Anesthesia Plan: General   Post-op Pain Management:    Induction: Intravenous  Airway Management Planned: Oral ETT  Additional Equipment:   Intra-op Plan:   Post-operative Plan: Extubation in OR  Informed Consent: I have reviewed the patients History and Physical, chart, labs and discussed the procedure including the risks, benefits and alternatives for the proposed anesthesia with the patient or authorized representative who has indicated his/her understanding and acceptance.     Plan Discussed with: CRNA and Surgeon  Anesthesia Plan Comments: (Htn Chronic Afib low risk myoview 04/14/11 Smoker/COPD Gout   Plan GA with oral ETT  Kipp Brood, MD)      Anesthesia Quick Evaluation

## 2011-10-17 NOTE — Brief Op Note (Cosign Needed)
10/17/2011  2:51 PM  PATIENT:  Gwenevere Ghazi  61 y.o. female  PRE-OPERATIVE DIAGNOSIS:  Left Hip AVN  POST-OPERATIVE DIAGNOSIS:  left HIp Avascular necrosis   PROCEDURE:  Procedure(s) (LRB) with comments: TOTAL HIP ARTHROPLASTY (Left) - Left Total Hip Arthroplasty  SURGEON:  Surgeon(s) and Role:    * Eldred Manges, MD - Primary  PHYSICIAN ASSISTANT: Maud Deed PAC  ASSISTANTS: none   ANESTHESIA:   general  EBL:  Total I/O In: 1000 [I.V.:1000] Out: 750 [Urine:400; Blood:350]  BLOOD ADMINISTERED:none  DRAINS: none   LOCAL MEDICATIONS USED:  NONE  SPECIMEN:  No Specimen  DISPOSITION OF SPECIMEN:  N/A  COUNTS:  YES  TOURNIQUET:  * No tourniquets in log *  DICTATION: .Note written in EPIC  PLAN OF CARE: Admit to inpatient   PATIENT DISPOSITION:  PACU - hemodynamically stable.   Delay start of Pharmacological VTE agent (>24hrs) due to surgical blood loss or risk of bleeding: yes

## 2011-10-18 LAB — CBC
HCT: 34.6 % — ABNORMAL LOW (ref 36.0–46.0)
MCV: 86.9 fL (ref 78.0–100.0)
RBC: 3.98 MIL/uL (ref 3.87–5.11)
WBC: 13.8 10*3/uL — ABNORMAL HIGH (ref 4.0–10.5)

## 2011-10-18 LAB — BASIC METABOLIC PANEL
BUN: 8 mg/dL (ref 6–23)
CO2: 25 mEq/L (ref 19–32)
Chloride: 101 mEq/L (ref 96–112)
Creatinine, Ser: 0.74 mg/dL (ref 0.50–1.10)
GFR calc Af Amer: >90 mL/min (ref >90)
Potassium: 4.1 mEq/L (ref 3.5–5.1)

## 2011-10-18 NOTE — Progress Notes (Signed)
Faxed orders to [facility] for RW. Pt states she has bedside commode at home. Waiting orders for [facility] and [facility]. NCM to arrange [place] [facility] for home. Offered choice for [facility] and pt is undecided. Will check with agencies to see when they can start care. No d/c date at this time. Isidoro Donning RN CCM Case Mgmt phone [phone]

## 2011-10-18 NOTE — Progress Notes (Signed)
Subjective: 1 Day Post-Op Procedure(s) (LRB): TOTAL HIP ARTHROPLASTY (Left) Patient reports pain as moderate.    Objective: Vital signs in last 24 hours: Temp:  [97.5 F (36.4 C)-98 F (36.7 C)] 98 F (36.7 C) (09/21 0524) Pulse Rate:  [72-91] 82  (09/21 0524) Resp:  [11-26] 20  (09/21 0800) BP: (121-165)/(79-90) 132/87 mmHg (09/21 0524) SpO2:  [93 %-100 %] 99 % (09/21 0800) FiO2 (%):  [99 %] 99 % (09/21 0800)  Intake/Output from previous day: 09/20 0701 - 09/21 0700 In: 1850 [I.V.:1800; IV Piggyback:50] Out: 1550 [Urine:1200; Blood:350] Intake/Output this shift: Total I/O In: 871.3 [I.V.:871.3] Out: -    Basename 10/18/11 0430 10/17/11 1128  HGB 12.1 14.2    Basename 10/18/11 0430 10/17/11 1128  WBC 13.8* 11.1*  RBC 3.98 4.66  HCT 34.6* 40.7  PLT 254 279    Basename 10/18/11 0430 10/17/11 1128  NA 135 142  K 4.1 3.6  CL 101 105  CO2 25 27  BUN 8 12  CREATININE 0.74 0.89  GLUCOSE 150* 90  CALCIUM 9.2 9.8    Basename 10/17/11 1128  LABPT --  INR 1.06    Neurologically intact  Assessment/Plan: 1 Day Post-Op Procedure(s) (LRB): TOTAL HIP ARTHROPLASTY (Left) Up with therapy  xrays  Leg length equal good position and allignment.   Trystyn Dolley C 10/18/2011, 1:11 PM

## 2011-10-18 NOTE — Op Note (Signed)
Tracy Johnston, Tracy Johnston NO.:  [id number]  MEDICAL RECORD NO.:  [id number]  LOCATION:  [facility]                         FACILITY:  [facility]  PHYSICIAN:  Ahmod Gillespie C. Ophelia Charter, M.D.    DATE OF BIRTH:  Oct 13, 1950  DATE OF PROCEDURE:  10/17/2011 DATE OF DISCHARGE:                              OPERATIVE REPORT   PREOPERATIVE DIAGNOSIS:  Left hip avascular necrosis.  POSTOPERATIVE DIAGNOSIS:  Left hip avascular necrosis.  PROCEDURE:  Left total hip arthroplasty, press-fit.  SURGEON:  Nalin Mazzocco C. Ophelia Charter, MD  ASSISTANT:  Wende Neighbors, PA-C.  ANESTHESIA:  GOT.  COMPONENTS USED:  DePuy #5 press-fit stem, series 100 cup with centralizer 52 mm with a 10-degree wall poly.  High offset +1.5 mm neck.  PROCEDURE:  After induction of general anesthesia with tracheal intubation, lateral positioning, preoperative consent have been obtained.  Preoperative antibiotics were given.  Time-out procedure was completed.  Prepping was performed with DuraPrep.  Usual hip sheets, drapes, split sheets, impervious stockinette, Coban, sterile skin marker and Betadine, Steri-Drape x2 was used to seal the skin.  Posterior approach was made.  Bleeding was controlled with Bovie electrocautery. Gluteus maximus was split in line with the fibers.  Extensor fascia was opened and deep Charnley was placed.  Piriformis was tagged for later repair of the gluteus medius at the end of the case.  Posterior capsule was opened.  There was immediate gush of synovial fluid, clear, yellow under pressure.  Hip was dislocated.  Neck was cut 1 fingerbreadth above the lesser trochanter.  Sequential reaming up to 4.5 broaching up to 5 which gave a nice tight fit, and then placement of __________ sponge down the canal after irrigation.  Acetabulum was __________ progressively reaming and she had a rather large acetabulum for her size.  The head had been excised, had depressed area centrally, where there had been collapse and  there were some mobility of the cartilage from the avascular bone underneath.  Minimal wear on the acetabulum was noted sequential reaming after excision of the labrum up to 51 __________ all the way down, 52 gave excellent anterior and posterior wall tight fit.  Permanent cup was inserted, trial liner, trial stem, and +1.5 high offset neck gave excellent stability flexion to 90, internal rotation to 80 __________ placed in the pelvis prior to cutting the neck, and drill bit placed in the greater trochanter.  They were both parallel, was remeasured after the components were placed, both with the trial, and at the end of the case, with the permanent prosthesis, there was restoration of leg length back to 47 mm exactly. Permanent centralizer was inserted.  Poly was popped into place. Checked was secure posterolaterals, where the 10-degree liner was placed.  A #5 stem was placed, +1.5 with a high offset and cleaning the trunnion impacting the ball and then reduction of the hip with protection of the sciatic nerve.  Identical findings of stability. Identical findings of leg lengths, repair of the piriformis to gluteus medius. Nonabsorbable in the tensor fascia, 0 Vicryl in the gluteus maximus, 2-0 Vicryl subcutaneous tissue.  Subcuticular closure was performed.  Postop dressing, knee immobilizer, and transferred to the  recovery room. Instrument count and needle count was correct.     Yoshiko Keleher C. Ophelia Charter, M.D.     [facility]MEDQ  D:  10/17/2011  T:  10/18/2011  Job:  [id number]

## 2011-10-18 NOTE — Evaluation (Signed)
Physical Therapy Evaluation Patient Details Name: Tracy Johnston MRN: [id number] DOB: November 13, 1950 Today's Date: 10/18/2011 Time: 1000-1033 PT Time Calculation (min): 33 min  PT Assessment / Plan / Recommendation Clinical Impression  Pt  is a 61 y/o female s/p L THA.  Pt will benefit from Acute PT intervention to maximize functional mobility in preparation for d/c home     PT Assessment  Patient needs continued PT services    Follow Up Recommendations  Home health PT;Supervision - Intermittent    Barriers to Discharge None      Equipment Recommendations  None recommended by PT    Recommendations for Other Services     Frequency 7X/week    Precautions / Restrictions Precautions Precautions: Posterior Hip;Fall Precaution Booklet Issued: Yes (comment) Precaution Comments: Educated pt in posterior hip precaution and WBAT status.  Required Braces or Orthoses: Knee Immobilizer - Left Knee Immobilizer - Left: Other (comment) (in bed.) Restrictions Weight Bearing Restrictions: Yes LLE Weight Bearing: Weight bearing as tolerated   Pertinent Vitals/Pain Pt reports pain 4-6/10. Using PCA for pain.       Mobility  Bed Mobility Bed Mobility: Rolling Right;Supine to Sit;Sitting - Scoot to [place] of Bed Rolling Right: 3: Mod assist Supine to Sit: 3: Mod assist;With rails;HOB elevated (Pt unable to tolerate HOB flat. ) Supine to Sit: Patient Percentage: 50% Sitting - Scoot to Edge of Bed: 3: Mod assist Details for Bed Mobility Assistance: Step by step cueing for technique and compliance with posterior hip precautions.   Assist for LLE secondary to pain.  Pt yells out whenever pain is felt.    Transfers Transfers: Sit to Stand;Stand to Sit;Stand Pivot Transfers Sit to Stand: 3: Mod assist;With upper extremity assist;From bed Sit to Stand: Patient Percentage: 50% Stand to Sit: 3: Mod assist;With upper extremity assist;To chair/3-in-1 Stand to Sit: Patient Percentage: 50% Stand Pivot  Transfers: 3: Mod assist Details for Transfer Assistance: Cues for technique including L LE positioning to minimize pain, and hand placement on bed/chair.  Pt Required assist to position L LE to support her weight in standing.  Ambulation/Gait Ambulation/Gait Assistance: Not tested (comment) Wheelchair Mobility Wheelchair Mobility: No    Exercises Total Joint Exercises Ankle Circles/Pumps: Both;10 reps Quad Sets: Both;5 reps Gluteal Sets: Both;5 reps Heel Slides: Left;10 reps;AAROM   PT Diagnosis: Difficulty walking;Generalized weakness;Acute pain  PT Problem List: Decreased strength;Decreased range of motion;Decreased activity tolerance;Decreased mobility;Decreased coordination;Decreased knowledge of use of DME;Decreased knowledge of precautions;Pain;Obesity PT Treatment Interventions: Gait training;DME instruction;Stair training;Functional mobility training;Therapeutic activities;Therapeutic exercise;Patient/family education;Manual techniques   PT Goals Acute Rehab PT Goals PT Goal Formulation: With patient Time For Goal Achievement: 10/25/11 Potential to Achieve Goals: Good Pt will go Supine/Side to Sit: with supervision PT Goal: Supine/Side to Sit - Progress: Goal set today Pt will go Sit to Supine/Side: with supervision PT Goal: Sit to Supine/Side - Progress: Goal set today Pt will go Sit to Stand: with supervision PT Goal: Sit to Stand - Progress: Goal set today Pt will go Stand to Sit: with supervision PT Goal: Stand to Sit - Progress: Goal set today Pt will Transfer Bed to Chair/Chair to Bed: with supervision PT Transfer Goal: Bed to Chair/Chair to Bed - Progress: Goal set today Pt will Ambulate: 51 - 150 feet;with supervision PT Goal: Ambulate - Progress: Goal set today Pt will Go Up / Down Stairs: 1-2 stairs;with min assist;with least restrictive assistive device PT Goal: Up/Down Stairs - Progress: Goal set today Pt will Perform Home Exercise Program: with  supervision,  verbal cues required/provided PT Goal: Perform Home Exercise Program - Progress: Goal set today  Visit Information  Last PT Received On: 10/18/11 Assistance Needed: +2    Subjective Data  Subjective: Agree to PT eval2 Patient Stated Goal: walk with out pain in L hip until able to have surgery on right hip.     Prior Functioning  Home Living Lives With: Family Available Help at Discharge: Personal care attendant (8 hours per day) Type of Home: House Home Access: Stairs to enter [employer] of Steps: 2 Entrance Stairs-Rails: None Home Layout: One level Bathroom Shower/Tub: Forensic scientist: Standard Bathroom Accessibility: Yes How Accessible: Accessible via walker;Accessible via wheelchair Home Adaptive Equipment: Bedside commode/3-in-1;Grab bars in shower;Hand-held shower hose;Shower chair with back;Tub transfer bench;Walker - four wheeled Prior Function Level of Independence: Independent with assistive device(s) Able to Take Stairs?: Yes Driving: Yes Vocation: On disability Communication Communication: No difficulties Dominant Hand: Right    Cognition  Overall Cognitive Status: Appears within functional limits for tasks assessed/performed Arousal/Alertness: Awake/alert Orientation Level: Oriented X4 / Intact Behavior During Session: [facility] for tasks performed    Extremity/Trunk Assessment Right Upper Extremity Assessment RUE ROM/Strength/Tone: Within functional levels Left Upper Extremity Assessment LUE ROM/Strength/Tone: Within functional levels Right Lower Extremity Assessment RLE ROM/Strength/Tone: Deficits RLE ROM/Strength/Tone Deficits: Limited by  hip pain.   Left Lower Extremity Assessment LLE ROM/Strength/Tone: Deficits;Due to pain;Due to precautions LLE ROM/Strength/Tone Deficits: limited ROM and strength secondary to surgery   Balance Balance Balance Assessed: Yes Static Sitting Balance Static Sitting - Balance Support:  Feet supported;Bilateral upper extremity supported Static Sitting - Level of Assistance: 5: Stand by assistance Static Sitting - Comment/# of Minutes: Pt sat on EOB for several minutes preparing herself to stand with occasion eccessive posterior lean.    End of Session PT - End of Session Equipment Utilized During Treatment: Gait belt Activity Tolerance: Patient limited by pain Patient left: in chair;with call bell/phone within reach Nurse Communication: Mobility status;Patient requests pain meds;Weight bearing status  GP     Daziah Hesler 10/18/2011, 1:36 PM  Brinden Kincheloe L. Theodore Virgin DPT [phone]

## 2011-10-19 LAB — CBC
HCT: 28.5 % — ABNORMAL LOW (ref 36.0–46.0)
Hemoglobin: 10 g/dL — ABNORMAL LOW (ref 12.0–15.0)
MCHC: 35.1 g/dL (ref 30.0–36.0)
MCV: 87.7 fL (ref 78.0–100.0)
RDW: 14.2 % (ref 11.5–15.5)
WBC: 16.1 10*3/uL — ABNORMAL HIGH (ref 4.0–10.5)

## 2011-10-19 NOTE — Progress Notes (Signed)
Physical Therapy Treatment Patient Details Name: Tracy Johnston MRN: [id number] DOB: March 20, 1950 Today's Date: 10/19/2011 Time: 1320-1350 PT Time Calculation (min): 30 min  PT Assessment / Plan / Recommendation Comments on Treatment Session  Still moving slowly, however highly motivated to progress and go home by end of next day.  Not sure she will achieve THAT goal, however demonstrated a significant improvement in independent mobility today.  Needs to walk more oftern    Follow Up Recommendations  Home health PT;Skilled nursing facility    Barriers to Discharge        Equipment Recommendations  None recommended by PT    Recommendations for Other Services    Frequency 7X/week   Plan Discharge plan remains appropriate;Frequency remains appropriate    Precautions / Restrictions Precautions Precautions: Posterior Hip;Fall Precaution Comments: reinforce and clarify hip precautions...pt thought she could not bend KNEE Required Braces or Orthoses: Knee Immobilizer - Left Restrictions Weight Bearing Restrictions: Yes LLE Weight Bearing: Weight bearing as tolerated   Pertinent Vitals/Pain 5/10 pain at rest (30 min after meds), 7/10 with movement.    Mobility  Bed Mobility Bed Mobility: Not assessed (up in and back to recliner by pt preference) Transfers Transfers: Sit to Stand;Stand to Sit;Stand Pivot Transfers Sit to Stand: 4: Min assist;With upper extremity assist;From chair/3-in-1;From elevated surface Sit to Stand: Patient Percentage: 80% Stand to Sit: 4: Min assist;With upper extremity assist;To chair/3-in-1;To elevated surface Stand to Sit: Patient Percentage: 80% Stand Pivot Transfers: 4: Min assist;Other (comment) (with RW and cues for sequencing LE's) Details for Transfer Assistance: Cues for technique including L LE positioning to minimize pain, and hand placement on bed/chair.  Pt recalls on her own to move L LE out prior to sit, and needs min cues for best strategy hand  placement to stand.  Stood from [facility] without physical assistance. Ambulation/Gait Ambulation/Gait Assistance: Not tested (comment)    Exercises Total Joint Exercises Ankle Circles/Pumps: Both;10 reps Quad Sets: Both;5 reps Gluteal Sets: Both;5 reps   PT Diagnosis:    PT Problem List:   PT Treatment Interventions:     PT Goals Acute Rehab PT Goals PT Goal Formulation: With patient Time For Goal Achievement: 10/25/11 Potential to Achieve Goals: Good PT Goal: Supine/Side to Sit - Progress: Progressing toward goal PT Goal: Sit to Supine/Side - Progress: Progressing toward goal PT Goal: Sit to Stand - Progress: Progressing toward goal PT Goal: Stand to Sit - Progress: Progressing toward goal PT Transfer Goal: Bed to Chair/Chair to Bed - Progress: Progressing toward goal PT Goal: Ambulate - Progress: Not met PT Goal: Up/Down Stairs - Progress: Not met PT Goal: Perform Home Exercise Program - Progress: Progressing toward goal  Visit Information  Last PT Received On: 10/19/11 Assistance Needed: +1    Subjective Data  Subjective: States she needs to use the bathroom Patient Stated Goal: walk with out pain in L hip until able to have surgery on right hip.     Cognition  Overall Cognitive Status: Appears within functional limits for tasks assessed/performed Arousal/Alertness: Awake/alert Orientation Level: Oriented X4 / Intact Behavior During Session: WFL for tasks performed    Balance     End of Session PT - End of Session Activity Tolerance: Patient limited by pain;Patient tolerated treatment well Patient left: in chair;with call bell/phone within reach Nurse Communication: Mobility status   GP     Dennis Bast 10/19/2011, 4:53 PM

## 2011-10-19 NOTE — Progress Notes (Signed)
Physical Therapy Treatment Patient Details Name: Tracy Johnston MRN: [id number] DOB: 1950/06/04 Today's Date: 10/19/2011 Time: [phone] (approx 5 minutes pt was on commode) PT Time Calculation (min): 48 min  PT Assessment / Plan / Recommendation Comments on Treatment Session  s/p LTKA secondary to avascular necrosis; Made progress today, walked, which is mroe than yesterday, though moving slowly quite slowly and limited by pain; Needing qutie a lot of assist with bed mobility especially; if pt does not make faster progress, may need to consider SNF    Follow Up Recommendations  Home health PT;Skilled nursing facility for rehab if slow progress    Barriers to Discharge        Equipment Recommendations  None recommended by PT    Recommendations for Other Services OT consult  Frequency 7X/week   Plan Discharge plan needs to be updated;Frequency remains appropriate    Precautions / Restrictions Precautions Precautions: Posterior Hip;Fall Precaution Comments: Educated pt in posterior hip precaution and WBAT status.  Required Braces or Orthoses: Knee Immobilizer - Left Knee Immobilizer - Left:  (in bed) Restrictions LLE Weight Bearing: Weight bearing as tolerated   Pertinent Vitals/Pain L hip pain 10+/10; used PCA    Mobility  Bed Mobility Bed Mobility: Supine to Sit Supine to Sit: 1: +2 Total assist;With rails;HOB elevated (slightly elevated) Supine to Sit: Patient Percentage: 50% Details for Bed Mobility Assistance: Step by step cueing for technique and compliance with posterior hip precautions.   Assist for LLE secondary to pain.  Pt yells out whenever pain is felt.    Transfers Transfers: Sit to Stand;Stand to Sit Sit to Stand: 1: +2 Total assist;From bed;From chair/3-in-1;With upper extremity assist Sit to Stand: Patient Percentage: 50% Stand to Sit: 1: +2 Total assist;To chair/3-in-1 Stand to Sit: Patient Percentage: 50% Details for Transfer Assistance: Cues for technique  including L LE positioning to minimize pain, and hand placement on bed/chair.  Pt Required assist to position L LE to support her weight in standing.  Ambulation/Gait Ambulation/Gait Assistance: 4: Min assist (second person helpful for lines) Ambulation Distance (Feet): 35 Feet (to/from bathroom) Assistive device: Rolling walker Ambulation/Gait Assistance Details: Cues for sequence, and to press into RW to unweigh painful LLE; Pt quite distracted by pin, so simply cued pt to take small steps; Cues to adhere to post hip prec with turns; Moving slowly Gait Pattern: Step-to pattern    Exercises     PT Diagnosis:    PT Problem List:   PT Treatment Interventions:     PT Goals Acute Rehab PT Goals Time For Goal Achievement: 10/25/11 Potential to Achieve Goals: Good Pt will go Supine/Side to Sit: with supervision PT Goal: Supine/Side to Sit - Progress: Progressing toward goal Pt will go Sit to Stand: with supervision PT Goal: Sit to Stand - Progress: Progressing toward goal Pt will go Stand to Sit: with supervision PT Goal: Stand to Sit - Progress: Progressing toward goal Pt will Ambulate: 51 - 150 feet;with supervision PT Goal: Ambulate - Progress: Progressing toward goal  Progress is extremely slow  Visit Information  Last PT Received On: 10/19/11 Assistance Needed: +2    Subjective Data  Subjective: States she needs to use the bathroom   Cognition  Overall Cognitive Status: Appears within functional limits for tasks assessed/performed Arousal/Alertness: Awake/alert Orientation Level: Oriented X4 / Intact Behavior During Session: [facility] for tasks performed (somewhat self-limiting)    Balance     End of Session PT - End of Session Equipment Utilized During Treatment:  Gait belt Activity Tolerance: Patient limited by pain Patient left: in chair;with call bell/phone within reach Nurse Communication: Mobility status   GP     Van Clines [facility] [place], [place]  [phone]]  10/19/2011, 9:44 AM

## 2011-10-19 NOTE — Progress Notes (Signed)
Subjective: 2 Days Post-Op Procedure(s) (LRB): TOTAL HIP ARTHROPLASTY (Left) Patient reports pain as moderate.    Objective: Vital signs in last 24 hours: Temp:  [97.8 F (36.6 C)-98.9 F (37.2 C)] 98.9 F (37.2 C) (09/22 5621) Pulse Rate:  [74-107] 74  (09/22 0632) Resp:  [14-24] 16  (09/22 0632) BP: (111-135)/(66-87) 122/66 mmHg (09/22 0632) SpO2:  [95 %-99 %] 99 % (09/22 0912) FiO2 (%):  [40 %-97 %] 40 % (09/22 0622)  Intake/Output from previous day: 09/21 0701 - 09/22 0700 In: 2011.3 [P.O.:1140; I.V.:871.3] Out: 1200 [Urine:1200] Intake/Output this shift:     Basename 10/19/11 0615 10/18/11 0430 10/17/11 1128  HGB 10.0* 12.1 14.2    Basename 10/19/11 0615 10/18/11 0430  WBC 16.1* 13.8*  RBC 3.25* 3.98  HCT 28.5* 34.6*  PLT 205 254    Basename 10/18/11 0430 10/17/11 1128  NA 135 142  K 4.1 3.6  CL 101 105  CO2 25 27  BUN 8 12  CREATININE 0.74 0.89  GLUCOSE 150* 90  CALCIUM 9.2 9.8    Basename 10/17/11 1128  LABPT --  INR 1.06    Neurologically intact  Assessment/Plan: 2 Days Post-Op Procedure(s) (LRB): TOTAL HIP ARTHROPLASTY (Left) Up with therapy  Has not done stairs yet.   Brydon Spahr C 10/19/2011, 10:22 AM

## 2011-10-20 ENCOUNTER — Ambulatory Visit: Payer: Medicare Other | Admitting: Cardiology

## 2011-10-20 DIAGNOSIS — D62 Acute posthemorrhagic anemia: Secondary | ICD-10-CM | POA: Diagnosis not present

## 2011-10-20 LAB — CBC
HCT: 30.5 % — ABNORMAL LOW (ref 36.0–46.0)
Hemoglobin: 10.6 g/dL — ABNORMAL LOW (ref 12.0–15.0)
MCH: 30.3 pg (ref 26.0–34.0)
MCHC: 34.8 g/dL (ref 30.0–36.0)
MCV: 87.1 fL (ref 78.0–100.0)
Platelets: 236 K/uL (ref 150–400)
RBC: 3.5 MIL/uL — ABNORMAL LOW (ref 3.87–5.11)
RDW: 14.1 % (ref 11.5–15.5)
WBC: 15.1 K/uL — ABNORMAL HIGH (ref 4.0–10.5)

## 2011-10-20 MED ORDER — OXYCODONE HCL 5 MG PO TABS: 5.0000 mg | ORAL_TABLET | ORAL | Status: DC | PRN

## 2011-10-20 MED ORDER — METHOCARBAMOL 500 MG PO TABS: 500.0000 mg | ORAL_TABLET | Freq: Four times a day (QID) | ORAL | Status: DC | PRN

## 2011-10-20 NOTE — Progress Notes (Signed)
CARE MANAGEMENT NOTE 10/20/2011  Patient:  Tracy Johnston, Tracy Johnston   Account Number:  [id number]  Date Initiated:  10/20/2011  Documentation initiated by:  Vance Peper  Subjective/Objective Assessment:   61 yr old female s/p left total hip arthroplasty     Action/Plan:   CM spoke with patient regarding home health and DME needs. Patient has rolling waler and 3in1. Choice offered.   Anticipated DC Date:  10/20/2011   Anticipated DC Plan:  HOME W HOME HEALTH SERVICES      DC Planning Services  CM consult      [facility] Choice  HOME HEALTH   Choice offered to / List presented to:  C-1 Patient        HH arranged  HH-2 PT      [facility] agency  [facility]   Status of service:  Completed, signed off Medicare Important Message given?   (If response is "NO", the following Medicare IM given date fields will be blank) Date Medicare IM given:   Date Additional Medicare IM given:    Discharge Disposition:  HOME W HOME HEALTH SERVICES  Per UR Regulation:    If discussed at Long Length of Stay Meetings, dates discussed:    Comments:

## 2011-10-20 NOTE — Progress Notes (Signed)
Physical Therapy Treatment Patient Details Name: Tracy Johnston MRN: [id number] DOB: 1950/01/30 Today's Date: 10/20/2011 Time: 1478-2956 PT Time Calculation (min): 20 min  PT Assessment / Plan / Recommendation Comments on Treatment Session  Pt reports getting in and out of bed independently today.  RN confirmed.  Pt continues to be limited by fatigue and pain.  Explained to pt why short term SNF may be benefitial but pt is determined to d/c home today.  Pt claims that she will not be walking any farther than she did today at home.  Although home would not be the most appropriate d/c situation, Pt should be safe to d/c home with intermittent supervision.       Follow Up Recommendations  Home health PT;Supervision - Intermittent    Barriers to Discharge        Equipment Recommendations  Tub/shower bench;3 in 1 bedside comode    Recommendations for Other Services    Frequency 7X/week   Plan Discharge plan remains appropriate;Frequency remains appropriate    Precautions / Restrictions Precautions Precautions: Posterior Hip;Fall Precaution Booklet Issued: Yes (comment) Precaution Comments: recalled 1 out 3 precautions- reviewed precautions Required Braces or Orthoses: Knee Immobilizer - Left Restrictions LLE Weight Bearing: Weight bearing as tolerated   Pertinent Vitals/Pain 5/10 pain  In L hip RN notified.     Mobility  Bed Mobility Bed Mobility: Not assessed Transfers Transfers: Sit to Stand;Stand to Sit Sit to Stand: 5: Supervision;From chair/3-in-1;With upper extremity assist Stand to Sit: 5: Supervision;To chair/3-in-1;With upper extremity assist Details for Transfer Assistance: Supervision for safety.   Ambulation/Gait Ambulation/Gait Assistance: 5: Supervision Ambulation Distance (Feet): 30 Feet Assistive device: Rolling walker Ambulation/Gait Assistance Details: Cues to lift L LE and during L swing phase.  Cues to increase gait speed.   Gait Pattern: Step-to  pattern;Decreased step length - right;Decreased step length - left;Decreased hip/knee flexion - left Gait velocity: slow  Stairs: No Wheelchair Mobility Wheelchair Mobility: No    Exercises     PT Diagnosis:    PT Problem List:   PT Treatment Interventions:     PT Goals Acute Rehab PT Goals PT Goal Formulation: With patient Time For Goal Achievement: 10/25/11 Potential to Achieve Goals: Good Pt will go Supine/Side to Sit: with supervision PT Goal: Supine/Side to Sit - Progress: Progressing toward goal Pt will go Sit to Supine/Side: with supervision PT Goal: Sit to Supine/Side - Progress: Progressing toward goal Pt will go Sit to Stand: with supervision PT Goal: Sit to Stand - Progress: Progressing toward goal Pt will go Stand to Sit: with supervision PT Goal: Stand to Sit - Progress: Progressing toward goal Pt will Transfer Bed to Chair/Chair to Bed: with supervision PT Transfer Goal: Bed to Chair/Chair to Bed - Progress: Progressing toward goal Pt will Ambulate: 51 - 150 feet;with supervision PT Goal: Ambulate - Progress: Progressing toward goal Pt will Go Up / Down Stairs: 1-2 stairs;with min assist;with least restrictive assistive device PT Goal: Up/Down Stairs - Progress: Not met Pt will Perform Home Exercise Program: with supervision, verbal cues required/provided PT Goal: Perform Home Exercise Program - Progress: Not met  Visit Information  Last PT Received On: 10/20/11 Assistance Needed: +1    Subjective Data  Subjective: I dont have to walk very far at home <50 feet.   Patient Stated Goal: Be able to go home today.     Cognition  Overall Cognitive Status: Appears within functional limits for tasks assessed/performed Arousal/Alertness: Awake/alert Orientation Level: Oriented X4 / Intact  Behavior During Session: [name] for tasks performed    Balance     End of Session PT - End of Session Equipment Utilized During Treatment: Gait belt Activity Tolerance: Patient  limited by fatigue Patient left: in chair;with call bell/phone within reach Nurse Communication: Mobility status   GP     [name] 10/20/2011, 2:30 PM [name] DPT [phone]

## 2011-10-20 NOTE — Discharge Summary (Signed)
Physician Discharge Summary  Patient ID: Tracy Johnston MRN: [id number] DOB/AGE: 1950/09/29 61 y.o.  Admit date: 10/17/2011 Discharge date: 10/20/2011  Admission Diagnoses:  Avascular necrosis of femur head, left  Discharge Diagnoses:  Principal Problem:  *Avascular necrosis of femur head, left Active Problems:  Acute blood loss anemia   Past Medical History  Diagnosis Date  . Tobacco abuse   . Hypertension   . COPD (chronic obstructive pulmonary disease)   . Mitral regurgitation     Mild to moderate. Normal EF 09/2010  . Atrial fibrillation     chronic. Was first diagnosed in her early 21s.  . Insomnia   . Cancer 2003ish    , cervix    Surgeries: Procedure(s): TOTAL HIP ARTHROPLASTY on 10/17/2011 left    Consultants (if any):  none  Discharged Condition: Improved  Hospital Course: Tracy Johnston is an 61 y.o. female who was admitted 10/17/2011 with a diagnosis of Avascular necrosis of femur head, left and went to the operating room on 10/17/2011 and underwent the above named procedures.    She was given perioperative antibiotics:  Anti-infectives     Start     Dose/Rate Route Frequency Ordered Stop   10/17/11 1815   ceFAZolin (ANCEF) IVPB 2 g/50 mL premix        2 g 100 mL/hr over 30 Minutes Intravenous Every 6 hours 10/17/11 1703 10/18/11 0059   10/16/11 1426   ceFAZolin (ANCEF) IVPB 2 g/50 mL premix        2 g 100 mL/hr over 30 Minutes Intravenous 60 min pre-op 10/16/11 1426 10/17/11 1258        .  She was given sequential compression devices, early ambulation, and Xarelto  for DVT prophylaxis.  She benefited maximally from the hospital stay and there were no complications.    Recent vital signs:  Filed Vitals:   10/20/11 0532  BP: 94/59  Pulse: 74  Temp: 97.6 F (36.4 C)  Resp: 18    Recent laboratory studies:  Lab Results  Component Value Date   HGB 10.6* 10/20/2011   HGB 10.0* 10/19/2011   HGB 12.1 10/18/2011   Lab Results  Component Value  Date   WBC 15.1* 10/20/2011   PLT 236 10/20/2011   Lab Results  Component Value Date   INR 1.06 10/17/2011   Lab Results  Component Value Date   NA 135 10/18/2011   K 4.1 10/18/2011   CL 101 10/18/2011   CO2 25 10/18/2011   BUN 8 10/18/2011   CREATININE 0.74 10/18/2011   GLUCOSE 150* 10/18/2011    Discharge Medications:     Medication List     As of 10/20/2011  9:12 AM    STOP taking these medications         ibuprofen 800 MG tablet   Commonly known as: ADVIL,MOTRIN      TAKE these medications         albuterol 0.63 MG/3ML nebulizer solution   Commonly known as: ACCUNEB   Take 1 ampule by nebulization every 6 (six) hours as needed. For shortness of breath      allopurinol 100 MG tablet   Commonly known as: ZYLOPRIM   Take 100 mg by mouth daily.      diltiazem 240 MG 24 hr capsule   Commonly known as: CARDIZEM CD   Take 1 capsule (240 mg total) by mouth daily.      docusate sodium 100 MG capsule   Commonly known as: COLACE  Take 100 mg by mouth 2 (two) times daily.      Fluticasone-Salmeterol 250-50 MCG/DOSE Aepb   Commonly known as: ADVAIR   Inhale 1 puff into the lungs every 12 (twelve) hours.      HYDROcodone-acetaminophen 5-325 MG per tablet   Commonly known as: NORCO/VICODIN   Take 1-4 tablets by mouth every 6 (six) hours as needed. For pain      methocarbamol 500 MG tablet   Commonly known as: ROBAXIN   Take 1 tablet (500 mg total) by mouth every 6 (six) hours as needed.      olmesartan-hydrochlorothiazide 40-12.5 MG per tablet   Commonly known as: BENICAR HCT   Take 1 tablet by mouth daily.      oxyCODONE 5 MG immediate release tablet   Commonly known as: Oxy IR/ROXICODONE   Take 1-2 tablets (5-10 mg total) by mouth every 3 (three) hours as needed.      XARELTO 20 MG Tabs   Generic drug: Rivaroxaban   Take 20 mg by mouth daily.        Diagnostic Studies: Dg Pelvis Portable  10/17/2011  *RADIOLOGY REPORT*  Clinical Data: Hip replacement   PORTABLE PELVIS  Comparison: Portable exam 1535 hours correlate with MRI 09/16/2011  Findings: Bones appear demineralized. Acetabular and femoral components of a left hip prosthesis are identified in expected positions. Tip of femoral component excluded. No fracture or dislocation identified.  IMPRESSION: Left hip replacement without acute complication.   Original Report Authenticated By: Lollie Marrow, M.D.    Dg Chest Port 1 View  10/17/2011  *RADIOLOGY REPORT*  Clinical Data: Preoperative evaluation prior to hip surgery.  PORTABLE CHEST - 1 VIEW  Comparison: 10/07/2010 and chest CTA dated 10/11/2010.  Findings: Enlarged cardiac silhouette with an interval increase in size.  Mild prominence of the pulmonary vasculature and interstitial markings.  Clear lungs.  Left humerus fixation hardware.  Mild moderate scoliosis and degenerative changes.  IMPRESSION:  1.  Interval cardiomegaly and mild pulmonary vascular congestion. 2.  Stable mild chronic interstitial lung disease.   Original Report Authenticated By: Darrol Angel, M.D.    Dg Hip Portable 1 View Left  10/17/2011  *RADIOLOGY REPORT*  Clinical Data: Post left hip replacement  PORTABLE LEFT HIP - 1 VIEW  Comparison: Portable cross-table lateral view 1543 hours compared to accompanying pelvic  Findings: Acetabular and femoral components of a left hip prosthesis are identified. No fracture or dislocation seen.  IMPRESSION: Left hip prosthesis without acute abnormality.   Original Report Authenticated By: Lollie Marrow, M.D.     Disposition: 01-Home or Self Care      Discharge Orders    Future Appointments: Provider: Department: Dept Phone: Center:   11/12/2011 9:00 AM Rollene Rotunda, MD [facility] [phone] [facility]     Future Orders Please Complete By Expires   Diet - low sodium heart healthy      Call MD / Call 911      Comments:   If you experience chest pain or shortness of breath, CALL 911 and be transported to the  hospital emergency room.  If you develope a fever above 101 F, pus (white drainage) or increased drainage or redness at the wound, or calf pain, call your surgeon's office.   Constipation Prevention      Comments:   Drink plenty of fluids.  Prune juice may be helpful.  You may use a stool softener, such as Colace (over the counter) 100 mg twice a  day.  Use MiraLax (over the counter) for constipation as needed.   Increase activity slowly as tolerated      Discharge instructions      Comments:   Walk as tolerated using walker and weight bear as tolerated.  Change dressing daily or as needed.  Ice to hip as needed for pain control.   Follow the hip precautions as taught in Physical Therapy         Follow-up Information    Follow up with YATES,MARK C, MD. Schedule an appointment as soon as possible for a visit in 2 weeks.   Contact information:   [facility] [address] [place] [address] [phone]           Signed: Wende Neighbors 10/20/2011, 9:12 AM

## 2011-10-20 NOTE — Evaluation (Signed)
Occupational Therapy Evaluation Patient Details Name: Tracy Johnston MRN: [id number] DOB: [birth year] Today's Date: 10/20/2011 Time: 2536-6440 OT Time Calculation (min): 31 min  OT Assessment / Plan / Recommendation Clinical Impression  61 yo female Lt THA posterior precautions that could benefit from skilled OT acutely. Pt with poor recall of precautions. OT to follow acutely    OT Assessment  Patient needs continued OT Services    Follow Up Recommendations  Home health OT    Barriers to Discharge      Equipment Recommendations  Tub/shower bench;3 in 1 bedside comode    Recommendations for Other Services    Frequency  Min 2X/week    Precautions / Restrictions Precautions Precautions: Posterior Hip;Fall Precaution Comments: recalled 1 out 3 precautions- reviewed precautions Required Braces or Orthoses: Knee Immobilizer - Left Knee Immobilizer - Left: Other (comment) (in bed) Restrictions LLE Weight Bearing: Weight bearing as tolerated   Pertinent Vitals/Pain None reported Pt requesting that Lt LE elevated on pillow and pillow applied maintaining THP precautions    ADL  Eating/Feeding: Performed;Modified independent Where Assessed - Eating/Feeding: Chair Toilet Transfer: Simulated;Min Pension scheme manager Method: Sit to Barista: Raised toilet seat with arms (or 3-in-1 over toilet) Tub/Shower Transfer: Performed;Moderate assistance Tub/Shower Transfer Method: Stand pivot Tub/Shower Transfer Equipment: Counsellor Used: Gait belt;Rolling walker Transfers/Ambulation Related to ADLs: Pt ambulated with very fast pursed lip breathing. Pt educated on risk taking short quick breaths. Pt demonstrated controlled breathing with mod v/c. pt required restbreak after ~10 ft of ambulation.  (Mod v/c for sequence with RW) ADL Comments: Pt states "i've done all that" when OT suggested morning routine. Pt decline bathroom transfers for toilet and sink  however agreeable to tub. Pt will need HHOT and family (A) at d/c to complete tub transfer with bench safely    OT Diagnosis: Generalized weakness;Acute pain  OT Problem List: Decreased strength;Decreased activity tolerance;Impaired balance (sitting and/or standing);Decreased cognition;Decreased safety awareness;Decreased knowledge of use of DME or AE;Decreased knowledge of precautions;Pain OT Treatment Interventions: Self-care/ADL training;DME and/or AE instruction;Therapeutic activities;Patient/family education;Balance training   OT Goals Acute Rehab OT Goals OT Goal Formulation: With patient Time For Goal Achievement: 11/03/11 Potential to Achieve Goals: Good ADL Goals Pt Will Perform Lower Body Bathing: with modified independence;Sitting, chair;with adaptive equipment ADL Goal: Lower Body Bathing - Progress: Goal set today Pt Will Perform Lower Body Dressing: with modified independence;Sitting, chair;with adaptive equipment ADL Goal: Lower Body Dressing - Progress: Goal set today Pt Will Transfer to Toilet: with modified independence;with DME;3-in-1;Ambulation ADL Goal: Toilet Transfer - Progress: Goal set today Pt Will Perform Tub/Shower Transfer: with modified independence;Tub transfer;with DME;Ambulation;Transfer tub bench ADL Goal: Tub/Shower Transfer - Progress: Goal set today  Visit Information  Last OT Received On: 10/20/11 Assistance Needed: +1    Subjective Data  Subjective: "I am going home today"- pt anxious to go home Patient Stated Goal: to go home today   Prior Functioning  Vision/Perception  Home Living Lives With: Family Available Help at Discharge: Personal care attendant Type of Home: House Home Access: Stairs to enter [employer] of Steps: 2 Entrance Stairs-Rails: None Home Layout: One level Bathroom Shower/Tub: Forensic scientist: Standard Bathroom Accessibility: Yes How Accessible: Accessible via walker;Accessible via  wheelchair Home Adaptive Equipment: Bedside commode/3-in-1;Grab bars in shower;Hand-held shower hose;Shower chair with back;Tub transfer bench;Walker - four wheeled Prior Function Level of Independence: Independent with assistive device(s) Able to Take Stairs?: Yes Driving: Yes Vocation: On disability Communication Communication: No  difficulties Dominant Hand: Right      Cognition  Overall Cognitive Status: Appears within functional limits for tasks assessed/performed Arousal/Alertness: Awake/alert Orientation Level: Oriented X4 / Intact Behavior During Session: [facility] for tasks performed    Extremity/Trunk Assessment Right Upper Extremity Assessment RUE ROM/Strength/Tone: Within functional levels Left Upper Extremity Assessment LUE ROM/Strength/Tone: Within functional levels   Mobility  Shoulder Instructions  Transfers Sit to Stand: 4: Min guard;With upper extremity assist;From chair/3-in-1 Stand to Sit: 4: Min assist;With upper extremity assist;To chair/3-in-1 Details for Transfer Assistance: cue for extending Lt LE and hand placement on chair. Pt attempts to pull up into standing on RW       Exercise     Balance     End of Session OT - End of Session Activity Tolerance: Patient tolerated treatment well Patient left: in chair;with call bell/phone within reach Nurse Communication: Mobility status;Precautions  GO     Lucile Shutters 10/20/2011, 9:55 AM Pager: [phone]

## 2011-10-20 NOTE — Progress Notes (Signed)
UR COMPLETED  

## 2011-10-20 NOTE — Progress Notes (Addendum)
   CARE MANAGEMENT NOTE 10/20/2011  Patient:  Tracy Johnston, Tracy Johnston   Account Number:  [id number]  Date Initiated:  10/20/2011  Documentation initiated by:  Vance Peper  Subjective/Objective Assessment:   61 yr old female s/p left total hip arthroplasty     Action/Plan:   CM spoke with patient regarding home health and DME needs. Patient has rolling waler and 3in1. Choice offered.   Anticipated DC Date:  10/20/2011   Anticipated DC Plan:  HOME W HOME HEALTH SERVICES      DC Planning Services  CM consult      [facility] Choice  HOME HEALTH   Choice offered to / List presented to:  C-1 Patient   DME arranged  [facility]  3-N-1      DME agency  [facility]     HH arranged  HH-2 PT      [facility] agency  [facility]   Status of service:  Completed, signed off Medicare Important Message given?   (If response is "NO", the following Medicare IM given date fields will be blank) Date Medicare IM given:   Date Additional Medicare IM given:    Discharge Disposition:  HOME W HOME HEALTH SERVICES  Per UR Regulation:    If discussed at Long Length of Stay Meetings, dates discussed:    Comments:  10/20/2011 1130 late entry  10/18/2011 1200 Orders faxed to [facility] for DME for scheduled d/c home. Contacted Jermaine, DME rep to deliver to room. Isidoro Donning RN CCM Case Mgmt phone [phone]

## 2011-10-20 NOTE — Progress Notes (Signed)
Subjective: 3 Days Post-Op Procedure(s) (LRB): TOTAL HIP ARTHROPLASTY (Left) Patient reports pain as mild.   Anxious to go home.  Advised her she will need to complete PT before discharge today. She already has a nursing aide who comes to her home regularly.  Will need HHPT also Objective: Vital signs in last 24 hours: Temp:  [97.6 F (36.4 C)-99 F (37.2 C)] 97.6 F (36.4 C) (09/23 0532) Pulse Rate:  [74-100] 74  (09/23 0532) Resp:  [16-20] 18  (09/23 0532) BP: (94-106)/(59-68) 94/59 mmHg (09/23 0532) SpO2:  [92 %-99 %] 95 % (09/23 0756)  Intake/Output from previous day: 09/22 0701 - 09/23 0700 In: 720 [P.O.:720] Out: -  Intake/Output this shift:     Basename 10/20/11 0745 10/19/11 0615 10/18/11 0430 10/17/11 1128  HGB 10.6* 10.0* 12.1 14.2    Basename 10/20/11 0745 10/19/11 0615  WBC 15.1* 16.1*  RBC 3.50* 3.25*  HCT 30.5* 28.5*  PLT 236 205    Basename 10/18/11 0430 10/17/11 1128  NA 135 142  K 4.1 3.6  CL 101 105  CO2 25 27  BUN 8 12  CREATININE 0.74 0.89  GLUCOSE 150* 90  CALCIUM 9.2 9.8    Basename 10/17/11 1128  LABPT --  INR 1.06    Neurovascular intact Sensation intact distally Intact pulses distally Dorsiflexion/Plantar flexion intact Incision: no drainage  Assessment/Plan: 3 Days Post-Op Procedure(s) (LRB): TOTAL HIP ARTHROPLASTY (Left) Discharge home with home health today after PT here. Acute Blood loss anemia:  Pt asymptomatic no further treatment indicated at this time On Xarelto for her heart which will also serve for VTE prophylaxis  RX percocet and Robaxin OV 2weeks COD stable Anton Cheramie M 10/20/2011, 9:01 AM

## 2011-10-21 ENCOUNTER — Encounter (HOSPITAL_COMMUNITY): Payer: Self-pay | Admitting: Orthopaedic Surgery

## 2011-10-22 NOTE — Progress Notes (Signed)
Referral received for SNF. Chart reviewed and CSW has spoken with RNCM who indicates that patient is for DC to home with Home Health and DME.  CSW to sign off. Clementina Mareno T. Kodee Drury, LCSWA  [phone] 

## 2011-10-24 NOTE — Telephone Encounter (Signed)
error 

## 2011-11-12 ENCOUNTER — Ambulatory Visit: Payer: Medicare Other | Admitting: Cardiology

## 2011-11-25 ENCOUNTER — Other Ambulatory Visit: Payer: Self-pay | Admitting: *Deleted

## 2011-11-25 MED ORDER — DILTIAZEM HCL ER COATED BEADS 240 MG PO CP24: 240.0000 mg | ORAL_CAPSULE | Freq: Every day | ORAL | Status: AC

## 2011-12-16 ENCOUNTER — Other Ambulatory Visit (HOSPITAL_COMMUNITY): Payer: Self-pay | Admitting: Orthopaedic Surgery

## 2011-12-22 ENCOUNTER — Encounter (HOSPITAL_COMMUNITY): Payer: Self-pay | Admitting: Pharmacy Technician

## 2011-12-29 ENCOUNTER — Encounter (HOSPITAL_COMMUNITY)
Admission: RE | Admit: 2011-12-29 | Discharge: 2011-12-29 | Disposition: A | Discharge status: Home / Self Care | Payer: Medicare Other | Source: Ambulatory Visit | Attending: Orthopaedic Surgery | Admitting: Orthopaedic Surgery

## 2011-12-29 ENCOUNTER — Encounter (HOSPITAL_COMMUNITY): Payer: Self-pay

## 2011-12-29 ENCOUNTER — Ambulatory Visit (HOSPITAL_COMMUNITY)
Admission: RE | Admit: 2011-12-29 | Discharge: 2011-12-29 | Disposition: A | Discharge status: Home / Self Care | Payer: Medicare Other | Source: Ambulatory Visit | Attending: Orthopaedic Surgery | Admitting: Orthopaedic Surgery

## 2011-12-29 DIAGNOSIS — Z01818 Encounter for other preprocedural examination: Secondary | ICD-10-CM | POA: Insufficient documentation

## 2011-12-29 HISTORY — DX: Malignant neoplasm of unspecified kidney, except renal pelvis: C64.9

## 2011-12-29 HISTORY — DX: Unspecified osteoarthritis, unspecified site: M19.90

## 2011-12-29 LAB — CBC
HCT: 38.6 % (ref 36.0–46.0)
Hemoglobin: 13.2 g/dL (ref 12.0–15.0)
MCH: 28 pg (ref 26.0–34.0)
MCV: 81.8 fL (ref 78.0–100.0)
RBC: 4.72 MIL/uL (ref 3.87–5.11)

## 2011-12-29 LAB — URINALYSIS, ROUTINE W REFLEX MICROSCOPIC
Glucose, UA: NEGATIVE mg/dL
Hgb urine dipstick: NEGATIVE
Ketones, ur: NEGATIVE mg/dL
Protein, ur: NEGATIVE mg/dL

## 2011-12-29 LAB — COMPREHENSIVE METABOLIC PANEL
ALT: 9 U/L (ref 0–35)
BUN: 15 mg/dL (ref 6–23)
CO2: 27 mEq/L (ref 19–32)
Calcium: 9.5 mg/dL (ref 8.4–10.5)
GFR calc Af Amer: 60 mL/min — ABNORMAL LOW (ref >90)
GFR calc non Af Amer: 52 mL/min — ABNORMAL LOW (ref >90)
Glucose, Bld: 80 mg/dL (ref 70–99)
Sodium: 141 mEq/L (ref 135–145)

## 2011-12-29 LAB — SURGICAL PCR SCREEN: MRSA, PCR: NEGATIVE

## 2011-12-29 LAB — TYPE AND SCREEN

## 2011-12-29 NOTE — Pre-Procedure Instructions (Addendum)
20 Tracy Johnston  12/29/2011   Your procedure is scheduled on:01/02/12  Report to [facility] at 530 AM.  Call this number if you have problems the morning of surgery: [phone]   Remember:   Do not eat food: or drinkAfter Midnight.    Take these medicines the morning of surgery with A SIP OF WATER: advair inhaler, nedulizer, pain med, allopurinol, diltiazem  STOP xarelto per dr   Drucilla Schmidt not wear jewelry, make-up or nail polish.  Do not wear lotions, powders, or perfumes. You may wear deodorant.  Do not shave 48 hours prior to surgery. Men may shave face and neck.  Do not bring valuables to the hospital.  Contacts, dentures or bridgework may not be worn into surgery.  Leave suitcase in the car. After surgery it may be brought to your room.  For patients admitted to the hospital, checkout time is 11:00 AM the day of discharge.   Patients discharged the day of surgery will not be allowed to drive home.  Name and phone number of your driver: Tracy Johnston [phone]  Special Instructions: Incentive Spirometry - Practice and bring it with you on the day of surgery. Shower using CHG 2 nights before surgery and the night before surgery.  If you shower the day of surgery use CHG.  Use special wash - you have one bottle of CHG for all showers.  You should use approximately 1/3 of the bottle for each shower.   Please read over the following fact sheets that you were given: Pain Booklet, Coughing and Deep Breathing, Blood Transfusion Information, MRSA Information and Surgical Site Infection Prevention

## 2011-12-29 NOTE — Progress Notes (Signed)
Note clearance serpe pa 9/13, myoview,3/13, echo 9/13,

## 2011-12-30 NOTE — H&P (Addendum)
TOTAL HIP ADMISSION H&P  Patient is admitted for right total hip arthroplasty.  Subjective:  Chief Complaint: right hip pain  HPI: Tracy Johnston, 61 y.o. female, has a history of pain and functional disability in the right hip(s) due to avascular necrosis of the right femoral head and patient has failed non-surgical conservative treatments for greater than 12 weeks to include NSAID's and/or analgesics, use of assistive devices and activity modification.  Onset of symptoms was gradual starting 3 years ago with gradually worsening course since that time.The patient noted no past surgery on the right hip(s).  Patient currently rates pain in the right hip at 8 out of 10 with activity. Patient has night pain, worsening of pain with activity and weight bearing and pain that interfers with activities of daily living. Patient has evidence of subchondral sclerosis and joint space narrowing by imaging studies. MRI scan indicated AVN of the right hip, This condition presents safety issues increasing the risk of falls. This patient has had hip replacement on the left and tolerated the procedure well.  There is no current active infection.  Patient Active Problem List   Diagnosis Date Noted  . Avascular necrosis of femur head, left 10/17/2011    Priority: High    Class: Diagnosis of  . Acute blood loss anemia 10/20/2011    Priority: Low    Class: Acute  . COPD (chronic obstructive pulmonary disease) 03/03/2011  . Dyspnea 12/02/2010  . Preop cardiovascular exam 10/31/2010  . Claudication 10/18/2010  . Atrial fibrillation   . Tobacco abuse   . Hypertension    Past Medical History  Diagnosis Date  . Tobacco abuse   . Hypertension   . COPD (chronic obstructive pulmonary disease)   . Mitral regurgitation     Mild to moderate. Normal EF 09/2010  . Atrial fibrillation     chronic. Was first diagnosed in her early 78s.  . Insomnia   . Cancer 2003ish    , cervix  . Malignant neoplasm of kidney     rt  removed 2003  . Arthritis     Past Surgical History  Procedure Date  . Nephrectomy 2003  . Arm fracture     rods, pins and bone graft.- to left arm from hip  . Tubal ligation   . Bone graft hip iliac crest     to left arm  . Total hip arthroplasty 10/17/2011    Procedure: TOTAL HIP ARTHROPLASTY;  Surgeon: Eldred Manges, MD;  Location: [facility] OR;  Service: Orthopedics;  Laterality: Left;  Left Total Hip Arthroplasty  . Cervical conization w/bx     Prescriptions prior to admission  Medication Sig Dispense Refill  . albuterol (ACCUNEB) 0.63 MG/3ML nebulizer solution Take 1 ampule by nebulization every 6 (six) hours as needed. For shortness of breath      . allopurinol (ZYLOPRIM) 100 MG tablet Take 100 mg by mouth daily.      Marland Kitchen diltiazem (CARDIZEM CD) 240 MG 24 hr capsule Take 1 capsule (240 mg total) by mouth daily.  30 capsule  6  . docusate sodium (COLACE) 100 MG capsule Take 100 mg by mouth 2 (two) times daily.       . Fluticasone-Salmeterol (ADVAIR) 250-50 MCG/DOSE AEPB Inhale 2 puffs into the lungs every 12 (twelve) hours.       Marland Kitchen HYDROcodone-acetaminophen (NORCO) 7.5-325 MG per tablet Take 1 tablet by mouth every 6 (six) hours as needed. For pain      . methocarbamol (ROBAXIN)  500 MG tablet Take 500 mg by mouth every 6 (six) hours as needed. For pain      . olmesartan-hydrochlorothiazide (BENICAR HCT) 40-12.5 MG per tablet Take 1 tablet by mouth daily.        . polyethylene glycol (MIRALAX / GLYCOLAX) packet Take 17 g by mouth daily.      . Rivaroxaban (XARELTO) 20 MG TABS Take 20 mg by mouth daily.       Allergies  Allergen Reactions  . Aspirin Other (See Comments)    Bleeding at higher doses  . Latex Rash    When she wears gloves    History  Substance Use Topics  . Smoking status: Former Smoker -- 1.0 packs/day for 40 years    Types: Cigarettes    Quit date: 10/14/2010  . Smokeless tobacco: Never Used     Comment: just quit since getting out of hospital   . Alcohol Use: No      Comment: occasions only 1 glass of wine    History reviewed. No pertinent family history.   Review of Systems  Constitutional: Negative.   HENT: Negative.   Eyes: Negative.   Respiratory: Negative.   Cardiovascular: Negative.   Gastrointestinal: Negative.   Genitourinary: Negative.   Musculoskeletal: Positive for joint pain.       Right hip pain  Skin: Negative.   Neurological: Negative.   Endo/Heme/Allergies: Negative.   Psychiatric/Behavioral: Negative.     Objective:  Physical Exam  Constitutional: She is oriented to person, place, and time. She appears well-developed and well-nourished.  HENT:  Head: Normocephalic and atraumatic.  Eyes: EOM are normal. Pupils are equal, round, and reactive to light.  Neck: Normal range of motion. Neck supple.  Cardiovascular: Normal rate.   Respiratory: Effort normal.  GI: Soft.  Neurological: She is alert and oriented to person, place, and time.  Skin: Skin is warm and dry.  Psychiatric: She has a normal mood and affect.    Vital signs in last 24 hours: Temp:  [97.1 F (36.2 C)] 97.1 F (36.2 C) (12/06 0615) Pulse Rate:  [73] 73  (12/06 0615) Resp:  [18] 18  (12/06 0615) BP: (104)/(70) 104/70 mmHg (12/06 0615) SpO2:  [94 %] 94 % (12/06 0615)  Labs:   Estimated Body mass index is 34.38 kg/(m^2) as calculated from the following:   Height as of 10/17/11: 5' 2.992"(1.6 m).   Weight as of 10/17/11: 194 lb 0.1 oz(88 kg).   Imaging Review Plain radiographs demonstrate mild degenerative joint disease of the right hip(s). The bone quality appears to be adequate for age and reported activity level. MRI scan with finding consistent with avascular necrosis of the right femoral head. Assessment/Plan:  End stage arthritis, right hip(s)  The patient history, physical examination, clinical judgement of the provider and imaging studies are consistent with end stage degenerative joint disease of the right hip(s) and total hip  arthroplasty is deemed medically necessary. The treatment options including medical management, injection therapy, arthroscopy and arthroplasty were discussed at length. The risks and benefits of total hip arthroplasty were presented and reviewed. The risks due to aseptic loosening, infection, stiffness, dislocation/subluxation,  thromboembolic complications and other imponderables were discussed.  The patient acknowledged the explanation, agreed to proceed with the plan and consent was signed. Patient is being admitted for inpatient treatment for surgery, pain control, PT, OT, prophylactic antibiotics, VTE prophylaxis, progressive ambulation and ADL's and discharge planning.The patient is planning to be discharged home with home health services

## 2012-01-01 MED ORDER — CEFAZOLIN SODIUM-DEXTROSE 2-3 GM-% IV SOLR
2.0000 g | INTRAVENOUS | Status: AC
  Administered 2012-01-02: 2 g via INTRAVENOUS
  Filled 2012-01-01: qty 50

## 2012-01-02 ENCOUNTER — Inpatient Hospital Stay (HOSPITAL_COMMUNITY): Payer: Medicare Other

## 2012-01-02 ENCOUNTER — Inpatient Hospital Stay (HOSPITAL_COMMUNITY)
Admission: RE | Admit: 2012-01-02 | Discharge: 2012-01-05 | DRG: 470 | Disposition: A | Discharge status: Home Health | Payer: Medicare Other | Source: Ambulatory Visit | Attending: Orthopaedic Surgery | Admitting: Orthopaedic Surgery

## 2012-01-02 ENCOUNTER — Encounter (HOSPITAL_COMMUNITY): Payer: Self-pay | Admitting: Certified Registered"

## 2012-01-02 ENCOUNTER — Encounter (HOSPITAL_COMMUNITY): Payer: Self-pay | Admitting: *Deleted

## 2012-01-02 ENCOUNTER — Inpatient Hospital Stay (HOSPITAL_COMMUNITY): Payer: Medicare Other | Admitting: Certified Registered"

## 2012-01-02 ENCOUNTER — Encounter (HOSPITAL_COMMUNITY)
Admission: RE | Disposition: A | Discharge status: Home Health | Payer: Self-pay | Source: Ambulatory Visit | Attending: Orthopaedic Surgery

## 2012-01-02 DIAGNOSIS — I4891 Unspecified atrial fibrillation: Secondary | ICD-10-CM | POA: Diagnosis present

## 2012-01-02 DIAGNOSIS — Z87891 Personal history of nicotine dependence: Secondary | ICD-10-CM

## 2012-01-02 DIAGNOSIS — I10 Essential (primary) hypertension: Secondary | ICD-10-CM | POA: Diagnosis present

## 2012-01-02 DIAGNOSIS — Z7901 Long term (current) use of anticoagulants: Secondary | ICD-10-CM

## 2012-01-02 DIAGNOSIS — Z79899 Other long term (current) drug therapy: Secondary | ICD-10-CM

## 2012-01-02 DIAGNOSIS — Z96649 Presence of unspecified artificial hip joint: Secondary | ICD-10-CM

## 2012-01-02 DIAGNOSIS — I059 Rheumatic mitral valve disease, unspecified: Secondary | ICD-10-CM | POA: Diagnosis present

## 2012-01-02 DIAGNOSIS — G4700 Insomnia, unspecified: Secondary | ICD-10-CM | POA: Diagnosis present

## 2012-01-02 DIAGNOSIS — J4489 Other specified chronic obstructive pulmonary disease: Secondary | ICD-10-CM | POA: Diagnosis present

## 2012-01-02 DIAGNOSIS — Z8541 Personal history of malignant neoplasm of cervix uteri: Secondary | ICD-10-CM

## 2012-01-02 DIAGNOSIS — M87051 Idiopathic aseptic necrosis of right femur: Secondary | ICD-10-CM | POA: Diagnosis present

## 2012-01-02 DIAGNOSIS — M87059 Idiopathic aseptic necrosis of unspecified femur: Principal | ICD-10-CM | POA: Diagnosis present

## 2012-01-02 DIAGNOSIS — J449 Chronic obstructive pulmonary disease, unspecified: Secondary | ICD-10-CM | POA: Diagnosis present

## 2012-01-02 DIAGNOSIS — Z905 Acquired absence of kidney: Secondary | ICD-10-CM

## 2012-01-02 DIAGNOSIS — Z85528 Personal history of other malignant neoplasm of kidney: Secondary | ICD-10-CM

## 2012-01-02 HISTORY — PX: TOTAL HIP ARTHROPLASTY: SHX124

## 2012-01-02 SURGERY — ARTHROPLASTY, HIP, TOTAL,POSTERIOR APPROACH
Anesthesia: General | Site: Hip | Laterality: Right | Wound class: Clean

## 2012-01-02 MED ORDER — DIPHENHYDRAMINE HCL 12.5 MG/5ML PO ELIX: 12.5000 mg | ORAL_SOLUTION | Freq: Four times a day (QID) | ORAL | Status: DC | PRN

## 2012-01-02 MED ORDER — MIDAZOLAM HCL 5 MG/5ML IJ SOLN
INTRAMUSCULAR | Status: DC | PRN
  Administered 2012-01-02: 2 mg via INTRAVENOUS

## 2012-01-02 MED ORDER — ONDANSETRON HCL 4 MG/2ML IJ SOLN: 4.0000 mg | Freq: Four times a day (QID) | INTRAMUSCULAR | Status: DC | PRN

## 2012-01-02 MED ORDER — MEPERIDINE HCL 25 MG/ML IJ SOLN: 6.2500 mg | INTRAMUSCULAR | Status: DC | PRN

## 2012-01-02 MED ORDER — MORPHINE SULFATE (PF) 1 MG/ML IV SOLN
INTRAVENOUS | Status: DC
  Administered 2012-01-02 (×2): 1.5 mg via INTRAVENOUS
  Administered 2012-01-02: 10:00:00 via INTRAVENOUS
  Administered 2012-01-02: 10.5 mg via INTRAVENOUS
  Administered 2012-01-03: 25 mg via INTRAVENOUS
  Administered 2012-01-03: 12 mg via INTRAVENOUS
  Filled 2012-01-02: qty 25

## 2012-01-02 MED ORDER — POLYETHYLENE GLYCOL 3350 17 G PO PACK
17.0000 g | PACK | Freq: Every day | ORAL | Status: DC
  Administered 2012-01-03 – 2012-01-05 (×3): 17 g via ORAL
  Filled 2012-01-02 (×3): qty 1

## 2012-01-02 MED ORDER — ZOLPIDEM TARTRATE 5 MG PO TABS: 5.0000 mg | ORAL_TABLET | Freq: Every evening | ORAL | Status: DC | PRN

## 2012-01-02 MED ORDER — LIDOCAINE HCL (CARDIAC) 20 MG/ML IV SOLN
INTRAVENOUS | Status: DC | PRN
  Administered 2012-01-02: 100 mg via INTRAVENOUS

## 2012-01-02 MED ORDER — SODIUM CHLORIDE 0.9 % IR SOLN
Status: DC | PRN
  Administered 2012-01-02: 1000 mL

## 2012-01-02 MED ORDER — FLEET ENEMA 7-19 GM/118ML RE ENEM: 1.0000 | ENEMA | Freq: Once | RECTAL | Status: AC | PRN

## 2012-01-02 MED ORDER — METHOCARBAMOL 500 MG PO TABS
500.0000 mg | ORAL_TABLET | Freq: Four times a day (QID) | ORAL | Status: DC | PRN
  Administered 2012-01-02 – 2012-01-05 (×5): 500 mg via ORAL
  Filled 2012-01-02 (×4): qty 1

## 2012-01-02 MED ORDER — BISACODYL 10 MG RE SUPP: 10.0000 mg | Freq: Every day | RECTAL | Status: DC | PRN

## 2012-01-02 MED ORDER — HYDROCHLOROTHIAZIDE 12.5 MG PO CAPS
12.5000 mg | ORAL_CAPSULE | Freq: Every day | ORAL | Status: DC
  Filled 2012-01-02 (×3): qty 1

## 2012-01-02 MED ORDER — NALOXONE HCL 0.4 MG/ML IJ SOLN: 0.4000 mg | INTRAMUSCULAR | Status: DC | PRN

## 2012-01-02 MED ORDER — DOCUSATE SODIUM 100 MG PO CAPS
100.0000 mg | ORAL_CAPSULE | Freq: Two times a day (BID) | ORAL | Status: DC
  Administered 2012-01-02 – 2012-01-05 (×6): 100 mg via ORAL
  Filled 2012-01-02 (×7): qty 1

## 2012-01-02 MED ORDER — MORPHINE SULFATE (PF) 1 MG/ML IV SOLN
INTRAVENOUS | Status: AC
  Filled 2012-01-02: qty 25

## 2012-01-02 MED ORDER — ACETAMINOPHEN 650 MG RE SUPP: 650.0000 mg | Freq: Four times a day (QID) | RECTAL | Status: DC | PRN

## 2012-01-02 MED ORDER — CEFAZOLIN SODIUM 1-5 GM-% IV SOLN
1.0000 g | Freq: Four times a day (QID) | INTRAVENOUS | Status: AC
  Administered 2012-01-02 (×2): 1 g via INTRAVENOUS
  Filled 2012-01-02 (×2): qty 50

## 2012-01-02 MED ORDER — METHOCARBAMOL 100 MG/ML IJ SOLN
500.0000 mg | Freq: Four times a day (QID) | INTRAVENOUS | Status: DC | PRN
  Filled 2012-01-02: qty 5

## 2012-01-02 MED ORDER — OLMESARTAN MEDOXOMIL-HCTZ 40-12.5 MG PO TABS: 1.0000 | ORAL_TABLET | Freq: Every day | ORAL | Status: DC

## 2012-01-02 MED ORDER — MENTHOL 3 MG MT LOZG: 1.0000 | LOZENGE | OROMUCOSAL | Status: DC | PRN

## 2012-01-02 MED ORDER — LACTATED RINGERS IV SOLN
INTRAVENOUS | Status: DC | PRN
  Administered 2012-01-02 (×2): via INTRAVENOUS

## 2012-01-02 MED ORDER — METHOCARBAMOL 500 MG PO TABS
ORAL_TABLET | ORAL | Status: AC
  Filled 2012-01-02: qty 1

## 2012-01-02 MED ORDER — PHENYLEPHRINE HCL 10 MG/ML IJ SOLN
INTRAMUSCULAR | Status: DC | PRN
  Administered 2012-01-02: 80 ug via INTRAVENOUS

## 2012-01-02 MED ORDER — METOCLOPRAMIDE HCL 5 MG/ML IJ SOLN: 5.0000 mg | Freq: Three times a day (TID) | INTRAMUSCULAR | Status: DC | PRN

## 2012-01-02 MED ORDER — OXYCODONE HCL 5 MG/5ML PO SOLN: 5.0000 mg | Freq: Once | ORAL | Status: DC | PRN

## 2012-01-02 MED ORDER — IRBESARTAN 300 MG PO TABS
300.0000 mg | ORAL_TABLET | Freq: Every day | ORAL | Status: DC
  Administered 2012-01-05: 300 mg via ORAL
  Filled 2012-01-02 (×3): qty 1

## 2012-01-02 MED ORDER — ONDANSETRON HCL 4 MG/2ML IJ SOLN: 4.0000 mg | Freq: Once | INTRAMUSCULAR | Status: DC | PRN

## 2012-01-02 MED ORDER — PHENOL 1.4 % MT LIQD: 1.0000 | OROMUCOSAL | Status: DC | PRN

## 2012-01-02 MED ORDER — OXYCODONE HCL 5 MG PO TABS: 5.0000 mg | ORAL_TABLET | Freq: Once | ORAL | Status: DC | PRN

## 2012-01-02 MED ORDER — BUPIVACAINE HCL (PF) 0.25 % IJ SOLN
INTRAMUSCULAR | Status: DC | PRN
  Administered 2012-01-02: 10 mL

## 2012-01-02 MED ORDER — OXYCODONE HCL 5 MG PO TABS
ORAL_TABLET | ORAL | Status: AC
  Filled 2012-01-02: qty 2

## 2012-01-02 MED ORDER — FENTANYL CITRATE 0.05 MG/ML IJ SOLN
INTRAMUSCULAR | Status: DC | PRN
  Administered 2012-01-02: 150 ug via INTRAVENOUS
  Administered 2012-01-02: 100 ug via INTRAVENOUS
  Administered 2012-01-02 (×3): 50 ug via INTRAVENOUS

## 2012-01-02 MED ORDER — PROPOFOL 10 MG/ML IV BOLUS
INTRAVENOUS | Status: DC | PRN
  Administered 2012-01-02: 125 mg via INTRAVENOUS
  Administered 2012-01-02: 25 mg via INTRAVENOUS

## 2012-01-02 MED ORDER — ALLOPURINOL 100 MG PO TABS
100.0000 mg | ORAL_TABLET | Freq: Every day | ORAL | Status: DC
  Administered 2012-01-03: 100 mg via ORAL
  Filled 2012-01-02 (×2): qty 1

## 2012-01-02 MED ORDER — BUPIVACAINE HCL (PF) 0.25 % IJ SOLN
INTRAMUSCULAR | Status: AC
  Filled 2012-01-02: qty 30

## 2012-01-02 MED ORDER — MOMETASONE FURO-FORMOTEROL FUM 100-5 MCG/ACT IN AERO
2.0000 | INHALATION_SPRAY | Freq: Two times a day (BID) | RESPIRATORY_TRACT | Status: DC
  Administered 2012-01-02 – 2012-01-05 (×4): 2 via RESPIRATORY_TRACT
  Filled 2012-01-02: qty 8.8

## 2012-01-02 MED ORDER — OXYCODONE HCL 5 MG PO TABS
5.0000 mg | ORAL_TABLET | ORAL | Status: DC | PRN
  Administered 2012-01-02: 10 mg via ORAL
  Administered 2012-01-03: 5 mg via ORAL
  Administered 2012-01-03 – 2012-01-05 (×7): 10 mg via ORAL
  Filled 2012-01-02: qty 1
  Filled 2012-01-02 (×10): qty 2

## 2012-01-02 MED ORDER — RIVAROXABAN 20 MG PO TABS
20.0000 mg | ORAL_TABLET | Freq: Every day | ORAL | Status: DC
  Filled 2012-01-02 (×2): qty 1

## 2012-01-02 MED ORDER — ONDANSETRON HCL 4 MG PO TABS: 4.0000 mg | ORAL_TABLET | Freq: Four times a day (QID) | ORAL | Status: DC | PRN

## 2012-01-02 MED ORDER — ALBUTEROL SULFATE 0.63 MG/3ML IN NEBU
1.0000 | INHALATION_SOLUTION | Freq: Four times a day (QID) | RESPIRATORY_TRACT | Status: DC | PRN
Start: 2012-01-02 — End: 2012-01-02

## 2012-01-02 MED ORDER — KCL IN DEXTROSE-NACL 20-5-0.45 MEQ/L-%-% IV SOLN
INTRAVENOUS | Status: DC
  Administered 2012-01-02 – 2012-01-03 (×2): via INTRAVENOUS
  Filled 2012-01-02 (×3): qty 1000

## 2012-01-02 MED ORDER — HYDROMORPHONE HCL PF 1 MG/ML IJ SOLN
0.2500 mg | INTRAMUSCULAR | Status: DC | PRN
  Administered 2012-01-02 (×2): 0.5 mg via INTRAVENOUS

## 2012-01-02 MED ORDER — HYDROCODONE-ACETAMINOPHEN 7.5-325 MG PO TABS
1.0000 | ORAL_TABLET | Freq: Four times a day (QID) | ORAL | Status: DC
  Administered 2012-01-03 – 2012-01-04 (×5): 1 via ORAL
  Filled 2012-01-02 (×7): qty 1

## 2012-01-02 MED ORDER — ACETAMINOPHEN 325 MG PO TABS
650.0000 mg | ORAL_TABLET | Freq: Four times a day (QID) | ORAL | Status: DC | PRN
  Filled 2012-01-02: qty 2

## 2012-01-02 MED ORDER — ROCURONIUM BROMIDE 100 MG/10ML IV SOLN
INTRAVENOUS | Status: DC | PRN
  Administered 2012-01-02: 50 mg via INTRAVENOUS

## 2012-01-02 MED ORDER — DIPHENHYDRAMINE HCL 50 MG/ML IJ SOLN: 12.5000 mg | Freq: Four times a day (QID) | INTRAMUSCULAR | Status: DC | PRN

## 2012-01-02 MED ORDER — SODIUM CHLORIDE 0.9 % IJ SOLN: 9.0000 mL | INTRAMUSCULAR | Status: DC | PRN

## 2012-01-02 MED ORDER — ALBUTEROL SULFATE (5 MG/ML) 0.5% IN NEBU
2.5000 mg | INHALATION_SOLUTION | Freq: Four times a day (QID) | RESPIRATORY_TRACT | Status: DC | PRN
  Administered 2012-01-03: 2.5 mg via RESPIRATORY_TRACT
  Filled 2012-01-02: qty 0.5

## 2012-01-02 MED ORDER — HYDROMORPHONE HCL PF 1 MG/ML IJ SOLN
INTRAMUSCULAR | Status: AC
  Filled 2012-01-02: qty 1

## 2012-01-02 MED ORDER — METOCLOPRAMIDE HCL 10 MG PO TABS: 5.0000 mg | ORAL_TABLET | Freq: Three times a day (TID) | ORAL | Status: DC | PRN

## 2012-01-02 MED ORDER — DILTIAZEM HCL ER COATED BEADS 240 MG PO CP24
240.0000 mg | ORAL_CAPSULE | Freq: Every day | ORAL | Status: DC
  Administered 2012-01-03 – 2012-01-04 (×2): 240 mg via ORAL
  Filled 2012-01-02 (×3): qty 1

## 2012-01-02 MED ORDER — DIPHENHYDRAMINE HCL 12.5 MG/5ML PO ELIX: 12.5000 mg | ORAL_SOLUTION | ORAL | Status: DC | PRN

## 2012-01-02 SURGICAL SUPPLY — 74 items
APL SKNCLS STERI-STRIP NONHPOA (GAUZE/BANDAGES/DRESSINGS) ×1
BENZOIN TINCTURE PRP APPL 2/3 (GAUZE/BANDAGES/DRESSINGS) ×2 IMPLANT
BLADE SAW SAG 73X25 THK (BLADE) ×1
BLADE SAW SGTL 73X25 THK (BLADE) ×1 IMPLANT
BLADE SURG ROTATE 9660 (MISCELLANEOUS) IMPLANT
BRUSH FEMORAL CANAL (MISCELLANEOUS) IMPLANT
CLOTH BEACON ORANGE TIMEOUT ST (SAFETY) ×2 IMPLANT
COVER BACK TABLE 24X17X13 BIG (DRAPES) ×1 IMPLANT
DRAPE INCISE IOBAN 66X45 STRL (DRAPES) ×1 IMPLANT
DRAPE ORTHO SPLIT 77X108 STRL (DRAPES) ×4
DRAPE SURG ORHT 6 SPLT 77X108 (DRAPES) ×2 IMPLANT
DRAPE U-SHAPE 47X51 STRL (DRAPES) ×2 IMPLANT
DRILL BIT 7/64X5 (BIT) ×2 IMPLANT
DRSG PAD ABDOMINAL 8X10 ST (GAUZE/BANDAGES/DRESSINGS) ×3 IMPLANT
DURAPREP 26ML APPLICATOR (WOUND CARE) ×2 IMPLANT
ELECT CAUTERY BLADE 6.4 (BLADE) ×2 IMPLANT
ELECT REM PT RETURN 9FT ADLT (ELECTROSURGICAL) ×2
ELECTRODE REM PT RTRN 9FT ADLT (ELECTROSURGICAL) ×1 IMPLANT
EVACUATOR 1/8 PVC DRAIN (DRAIN) IMPLANT
FACESHIELD LNG OPTICON STERILE (SAFETY) ×4 IMPLANT
GAUZE XEROFORM 5X9 LF (GAUZE/BANDAGES/DRESSINGS) ×2 IMPLANT
GLOVE BIOGEL PI IND STRL 7.0 (GLOVE) IMPLANT
GLOVE BIOGEL PI IND STRL 7.5 (GLOVE) ×1 IMPLANT
GLOVE BIOGEL PI IND STRL 8 (GLOVE) ×1 IMPLANT
GLOVE BIOGEL PI INDICATOR 7.0 (GLOVE) ×1
GLOVE BIOGEL PI INDICATOR 7.5 (GLOVE) ×1
GLOVE BIOGEL PI INDICATOR 8 (GLOVE) ×1
GLOVE ECLIPSE 7.0 STRL STRAW (GLOVE) ×1 IMPLANT
GLOVE ORTHO TXT STRL SZ7.5 (GLOVE) ×1 IMPLANT
GLOVE SURG SS PI 7.5 STRL IVOR (GLOVE) ×4 IMPLANT
GOWN PREVENTION PLUS LG XLONG (DISPOSABLE) ×1 IMPLANT
GOWN PREVENTION PLUS XLARGE (GOWN DISPOSABLE) ×2 IMPLANT
GOWN STRL NON-REIN LRG LVL3 (GOWN DISPOSABLE) ×2 IMPLANT
HANDPIECE INTERPULSE COAX TIP (DISPOSABLE)
IMMOBILIZER KNEE 20 (SOFTGOODS)
IMMOBILIZER KNEE 20 THIGH 36 (SOFTGOODS) IMPLANT
IMMOBILIZER KNEE 22 UNIV (SOFTGOODS) ×1 IMPLANT
IMMOBILIZER KNEE 24 THIGH 36 (MISCELLANEOUS) IMPLANT
IMMOBILIZER KNEE 24 UNIV (MISCELLANEOUS)
KIT BASIN OR (CUSTOM PROCEDURE TRAY) ×2 IMPLANT
KIT ROOM TURNOVER OR (KITS) ×2 IMPLANT
MANIFOLD NEPTUNE II (INSTRUMENTS) ×2 IMPLANT
NDL 1/2 CIR MAYO (NEEDLE) ×1 IMPLANT
NDL HYPO 25GX1X1/2 BEV (NEEDLE) ×1 IMPLANT
NEEDLE 1/2 CIR MAYO (NEEDLE) ×2 IMPLANT
NEEDLE HYPO 25GX1X1/2 BEV (NEEDLE) ×2 IMPLANT
NEEDLE MAYO .5 CIRCLE (NEEDLE) ×2 IMPLANT
NS IRRIG 1000ML POUR BTL (IV SOLUTION) ×2 IMPLANT
PACK TOTAL JOINT (CUSTOM PROCEDURE TRAY) ×2 IMPLANT
PAD ARMBOARD 7.5X6 YLW CONV (MISCELLANEOUS) ×4 IMPLANT
PIN STEINMAN 3/16 (PIN) ×2 IMPLANT
PRESSURIZER FEMORAL UNIV (MISCELLANEOUS) IMPLANT
SET HNDPC FAN SPRY TIP SCT (DISPOSABLE) IMPLANT
SPONGE GAUZE 4X4 12PLY (GAUZE/BANDAGES/DRESSINGS) ×2 IMPLANT
SPONGE LAP 4X18 X RAY DECT (DISPOSABLE) ×4 IMPLANT
STAPLER VISISTAT 35W (STAPLE) ×2 IMPLANT
STRIP CLOSURE SKIN 1/2X4 (GAUZE/BANDAGES/DRESSINGS) ×2 IMPLANT
SUCTION FRAZIER TIP 10 FR DISP (SUCTIONS) ×2 IMPLANT
SUT ETHIBOND NAB CT1 #1 30IN (SUTURE) ×6 IMPLANT
SUT TICRON (SUTURE) ×4 IMPLANT
SUT VIC AB 0 CT1 27 (SUTURE) ×2
SUT VIC AB 0 CT1 27XBRD ANBCTR (SUTURE) IMPLANT
SUT VIC AB 2-0 CT1 27 (SUTURE) ×4
SUT VIC AB 2-0 CT1 TAPERPNT 27 (SUTURE) ×2 IMPLANT
SUT VICRYL 0 TIES 12 18 (SUTURE) IMPLANT
SUT VICRYL 4-0 PS2 18IN ABS (SUTURE) ×2 IMPLANT
SYR CONTROL 10ML LL (SYRINGE) ×2 IMPLANT
TAPE CLOTH SURG 4X10 WHT LF (GAUZE/BANDAGES/DRESSINGS) ×1 IMPLANT
TOWEL OR 17X24 6PK STRL BLUE (TOWEL DISPOSABLE) ×2 IMPLANT
TOWEL OR 17X26 10 PK STRL BLUE (TOWEL DISPOSABLE) ×2 IMPLANT
TOWER CARTRIDGE SMART MIX (DISPOSABLE) IMPLANT
TRAY FOLEY BAG SILVER LF 14FR (CATHETERS) ×1 IMPLANT
TRAY FOLEY CATH 14FR (SET/KITS/TRAYS/PACK) IMPLANT
WATER STERILE IRR 1000ML POUR (IV SOLUTION) ×8 IMPLANT

## 2012-01-02 NOTE — Progress Notes (Signed)
Physical Therapy Evaluation Patient Details Name: Tracy Johnston MRN: [id number] DOB: [birth year] Today's Date: 01/02/2012 Time: 4098-1191 PT Time Calculation (min): 25 min  PT Assessment / Plan / Recommendation Clinical Impression  Pt isa 61 y/o female s/p R THA.  Pt has recently completed rehab for her L THA. Pt lives with her 9 y.o granddaughter who is at school most of the day.  Pt also has a paid caregiver from 10 am- 5 pm.  Pt planning to d/c to home. Acute PT to follow pt to maximize functional mobility for discharge to home with HHPT.     PT Assessment  Patient needs continued PT services    Follow Up Recommendations  Home health PT;Supervision - Intermittent    Does the patient have the potential to tolerate intense rehabilitation      Barriers to Discharge None      Equipment Recommendations  None recommended by PT    Recommendations for Other Services     Frequency 7X/week    Precautions / Restrictions Precautions Precautions: Fall;Posterior Hip Precaution Booklet Issued: Yes (comment) Precaution Comments: Educated pt on posterior hip precautions.  Required Braces or Orthoses: Knee Immobilizer - Right Restrictions Weight Bearing Restrictions: Yes RLE Weight Bearing: Weight bearing as tolerated   Pertinent Vitals/Pain 8/10 pain in R knee with movement. Pt using CPM for pain control.       Mobility  Bed Mobility Bed Mobility: Supine to Sit;Sit to Supine;Sitting - Scoot to Edge of Bed Supine to Sit: 1: +2 Total assist;HOB elevated Supine to Sit: Patient Percentage: 30% Sitting - Scoot to Edge of Bed: 1: +2 Total assist Sit to Supine: 1: +2 Total assist Sit to Supine: Patient Percentage: 20% Details for Bed Mobility Assistance: Pt required step by step cueing and asssistance for all bed mobility Transfers Transfers: Sit to Stand;Stand to Sit Sit to Stand: 2: Max assist;From bed;From elevated surface;With upper extremity assist Stand to Sit: 3: Mod assist;To  bed Details for Transfer Assistance: Pt required assistance to initiate standing secondary to pain and pt's fear of placing weight on her left foot.   Ambulation/Gait Ambulation/Gait Assistance: Not tested (comment)    Shoulder Instructions     Exercises     PT Diagnosis:    PT Problem List: Decreased strength;Decreased range of motion;Decreased activity tolerance;Decreased mobility;Decreased balance;Decreased knowledge of use of DME;Decreased safety awareness;Obesity;Pain PT Treatment Interventions: Gait training;DME instruction;Functional mobility training;Therapeutic exercise;Therapeutic activities;Patient/family education   PT Goals Acute Rehab PT Goals PT Goal Formulation: With patient Time For Goal Achievement: 01/09/12 Potential to Achieve Goals: Good Pt will Roll Supine to Left Side: with supervision PT Goal: Rolling Supine to Left Side - Progress: Goal set today Pt will go Supine/Side to Sit: with supervision PT Goal: Supine/Side to Sit - Progress: Goal set today Pt will go Sit to Supine/Side: with supervision PT Goal: Sit to Supine/Side - Progress: Goal set today Pt will go Sit to Stand: with supervision PT Goal: Sit to Stand - Progress: Partly met Pt will go Stand to Sit: with supervision PT Goal: Stand to Sit - Progress: Goal set today Pt will Transfer Bed to Chair/Chair to Bed: with supervision PT Transfer Goal: Bed to Chair/Chair to Bed - Progress: Goal set today Pt will Ambulate: 16 - 50 feet;with supervision PT Goal: Ambulate - Progress: Met  Visit Information  Last PT Received On: 01/02/12 Assistance Needed: +2    Subjective Data  Subjective: Agree to PT eval Patient Stated Goal: Return to home  Prior Functioning  Home Living Lives With: Other (Comment) (Grand daughter) Available Help at Discharge: Family;Personal care attendant (   ) Type of Home: House Home Access: Level entry Home Layout: One level Home Adaptive Equipment: Bedside  commode/3-in-1;Walker - rolling Prior Function Level of Independence: Independent with assistive device(s) Able to Take Stairs?: No Driving: No Communication Communication: No difficulties Dominant Hand: Right    Cognition  Overall Cognitive Status: Appears within functional limits for tasks assessed/performed Arousal/Alertness: Lethargic Orientation Level: Appears intact for tasks assessed Behavior During Session: [facility] for tasks performed    Extremity/Trunk Assessment Right Upper Extremity Assessment RUE ROM/Strength/Tone: Within functional levels Left Upper Extremity Assessment LUE ROM/Strength/Tone: Within functional levels Right Lower Extremity Assessment RLE ROM/Strength/Tone: Unable to fully assess;Due to pain;Due to precautions Left Lower Extremity Assessment LLE ROM/Strength/Tone: [facility] for tasks assessed   Balance Balance Balance Assessed: Yes Static Standing Balance Static Standing - Balance Support: Bilateral upper extremity supported Static Standing - Level of Assistance: 3: Mod assist  End of Session PT - End of Session Equipment Utilized During Treatment: Gait belt Activity Tolerance: Patient limited by fatigue Patient left: in bed;with call bell/phone within reach  GP     Cabella Kimm 01/02/2012, 5:05 PM Brittaney Beaulieu L. Nathanuel Cabreja DPT [phone]

## 2012-01-02 NOTE — Progress Notes (Signed)
UR COMPLETED  

## 2012-01-02 NOTE — Transfer of Care (Signed)
Immediate Anesthesia Transfer of Care Note  Patient: Tracy Johnston  Procedure(s) Performed: Procedure(s) (LRB) with comments: TOTAL HIP ARTHROPLASTY (Right) - Right Total Hip Arthroplasty  Patient Location: PACU  Anesthesia Type:General  Level of Consciousness: awake and alert   Airway & Oxygen Therapy: Patient Spontanous Breathing and Patient connected to nasal cannula oxygen  Post-op Assessment: Report given to PACU RN, Post -op Vital signs reviewed and stable and Patient moving all extremities  Post vital signs: Reviewed and stable  Complications: No apparent anesthesia complications

## 2012-01-02 NOTE — Anesthesia Preprocedure Evaluation (Addendum)
Anesthesia Evaluation  Patient identified by MRN, date of birth, ID band Patient awake    Reviewed: Allergy & Precautions, H&P , NPO status , Patient's Chart, lab work & pertinent test results  Airway Mallampati: II TM Distance: >3 FB Neck ROM: Full    Dental  (+) Edentulous Upper and Edentulous Lower   Pulmonary shortness of breath and with exertion, COPD COPD inhaler,          Cardiovascular hypertension, Pt. on medications + dysrhythmias Atrial Fibrillation     Neuro/Psych    GI/Hepatic   Endo/Other    Renal/GU      Musculoskeletal   Abdominal   Peds  Hematology   Anesthesia Other Findings   Reproductive/Obstetrics                           Anesthesia Physical Anesthesia Plan  ASA: III  Anesthesia Plan: General   Post-op Pain Management:    Induction: Intravenous  Airway Management Planned: Oral ETT  Additional Equipment:   Intra-op Plan:   Post-operative Plan: Extubation in OR  Informed Consent: I have reviewed the patients History and Physical, chart, labs and discussed the procedure including the risks, benefits and alternatives for the proposed anesthesia with the patient or authorized representative who has indicated his/her understanding and acceptance.   Dental advisory given  Plan Discussed with: CRNA and Surgeon  Anesthesia Plan Comments:        Anesthesia Quick Evaluation

## 2012-01-02 NOTE — Anesthesia Postprocedure Evaluation (Signed)
Anesthesia Post Note  Patient: Tracy Johnston  Procedure(s) Performed: Procedure(s) (LRB): TOTAL HIP ARTHROPLASTY (Right)  Anesthesia type: general  Patient location: PACU  Post pain: Pain level controlled  Post assessment: Patient's Cardiovascular Status Stable  Last Vitals:  Filed Vitals:   01/02/12 1011  BP:   Pulse:   Temp:   Resp: 14    Post vital signs: Reviewed and stable  Level of consciousness: sedated  Complications: No apparent anesthesia complications

## 2012-01-02 NOTE — Preoperative (Signed)
Beta Blockers   Reason not to administer Beta Blockers:Not Applicable 

## 2012-01-02 NOTE — Brief Op Note (Cosign Needed)
01/02/2012  9:51 AM  PATIENT:  Tracy Johnston  61 y.o. 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  PROCEDURE:  Procedure(s) (LRB) with comments: TOTAL HIP ARTHROPLASTY (Right) - Right Total Hip Arthroplasty  SURGEON:  Surgeon(s) and Role:    * Eldred Manges, MD - Primary  PHYSICIAN ASSISTANT:Paraskevi Funez PAC  ASSISTANTS: none   ANESTHESIA:   general  EBL:  Total I/O In: 1200 [I.V.:1200] Out: 450 [Urine:150; Blood:300]  BLOOD ADMINISTERED:none  DRAINS: none   LOCAL MEDICATIONS USED:  MARCAINE     SPECIMEN:  No Specimen  DISPOSITION OF SPECIMEN:  N/A  COUNTS:  YES  TOURNIQUET:  * No tourniquets in log *  DICTATION: .Note written in EPIC  PLAN OF CARE: Admit to inpatient   PATIENT DISPOSITION:  PACU - hemodynamically stable.   Delay start of Pharmacological VTE agent (>24hrs) due to surgical blood loss or risk of bleeding: no

## 2012-01-02 NOTE — Op Note (Signed)
NAMEJERMANY, SUNDELL NO.:  [id number]  MEDICAL RECORD NO.:  [id number]  LOCATION:  5N01C                        FACILITY:  [facility]  PHYSICIAN:  Mark C. Ophelia Charter, M.D.    DATE OF BIRTH:  Jun 16, 1950  DATE OF PROCEDURE:  01/02/2012 DATE OF DISCHARGE:                              OPERATIVE REPORT   PREOPERATIVE DIAGNOSIS:  Right hip osteoarthritis.  POSTOPERATIVE DIAGNOSIS:  Right hip osteoarthritis.  PROCEDURE:  Right total hip arthroplasty.  SURGEON:  Mark C. Ophelia Charter, MD  ASSISTANT:  Maud Deed, PA-C, medically necessary and present for the entire procedure.  ANESTHESIA:  GOT, Marcaine skin local.  EBL:  100 mL.  DRAINS:  None.  COMPONENTS USED:  A #5 high offset +1.5 mm neck, 36 mm ball, 52 mm cup Summit single hole with polyethylene liner 10 degree wall.  Series 100 cup.  PROCEDURE:  After induction of general anesthesia, orotracheal intubation, lateral position, preoperative Ancef prophylaxis.  Time-out procedure completed after prepping down to the ankle.  Mark 7 frame was used axillary roll and the forearm holder.  Split sheets, drapes, impervious stockinette, Coban, and Betadine Steri- Drape was applied x2 sealing the skin after sterile skin marker. Posterior approach was made and gluteus maximus was split in line with the fibers.  Piriformis was tagged, cut.  Posterior capsule was opened. There was a gush of fluid.  Short external rotators were released.  The hip was dislocated.  A large Steinmann pin was placed in the lateral pelvis.  Hip was relocated and a small drill bit was placed in lateral trochanter parallel to the pelvic pin in measurement of 76 mm.  Hip was dislocated.  Neck cut.  Acetabulum was prepared with sequential reaming up to 51 for 52 cup inserted with guide using 45 degrees of abduction and 15-20 degrees of version.  The cup with down centralizer was inserted.  Cup had been reamed down the bleeding bone surface and down to  the base of the fovea.  Permanent poly was inserted posterolateral.  Sequential reaming lateralizer, trochanteric reamer, calcar reamer, and broaching was performed up to a #5 high offset with 36+ 1.5 ball.  There was flexion 90, internal rotation 80, good stability.  No tightness of the quad, no shuck, good abduction, no impingement, and good stability.  After irrigation with saline solution, piriformis was repaired back to the gluteus medius with the empty needle tied.  A #1 Ethibond closure of the tensor fascia, gluteus maximus fascia, 2-0 Vicryl subcutaneous tissue, subcuticular skin closure, postop dressing, and knee immobilizer.  Postoperative count at the end of the case was correct and the patient tolerated the procedure well.  Sciatic nerve was observed, palpated, and protected throughout the case.     Mark C. Ophelia Charter, M.D.     MCY/MEDQ  D:  01/02/2012  T:  01/02/2012  Job:  [id number]

## 2012-01-03 LAB — BASIC METABOLIC PANEL
BUN: 9 mg/dL (ref 6–23)
Calcium: 8.7 mg/dL (ref 8.4–10.5)
Creatinine, Ser: 0.96 mg/dL (ref 0.50–1.10)
GFR calc non Af Amer: 62 mL/min — ABNORMAL LOW (ref >90)
Glucose, Bld: 126 mg/dL — ABNORMAL HIGH (ref 70–99)
Sodium: 138 mEq/L (ref 135–145)

## 2012-01-03 LAB — CBC
Hemoglobin: 10.2 g/dL — ABNORMAL LOW (ref 12.0–15.0)
MCH: 27.1 pg (ref 26.0–34.0)
MCHC: 33.4 g/dL (ref 30.0–36.0)

## 2012-01-03 MED ORDER — RIVAROXABAN 20 MG PO TABS
20.0000 mg | ORAL_TABLET | Freq: Every day | ORAL | Status: DC
  Administered 2012-01-03 – 2012-01-04 (×2): 20 mg via ORAL
  Filled 2012-01-03 (×3): qty 1

## 2012-01-03 NOTE — Progress Notes (Signed)
Subjective: Pt stable - 'this one is worse than the other one'   Objective: Vital signs in last 24 hours: Temp:  [96.8 F (36 C)-98.9 F (37.2 C)] 98.3 F (36.8 C) (12/07 0630) Pulse Rate:  [68-97] 97  (12/07 0630) Resp:  [13-28] 18  (12/07 0630) BP: (98-129)/(60-82) 98/61 mmHg (12/07 0630) SpO2:  [94 %-98 %] 97 % (12/07 0630) Weight:  [85.4 kg (188 lb 4.4 oz)] 85.4 kg (188 lb 4.4 oz) (12/06 1236)  Intake/Output from previous day: 12/06 0701 - 12/07 0700 In: 2323.8 [I.V.:2323.8] Out: 650 [Urine:350; Blood:300] Intake/Output this shift: Total I/O In: -  Out: 600 [Urine:600]  Exam:  Neurovascular intact Sensation intact distally Intact pulses distally Dorsiflexion/Plantar flexion intact  Labs:  Basename 01/03/12 0550  HGB 10.2*    Basename 01/03/12 0550  WBC 13.7*  RBC 3.77*  HCT 30.5*  PLT 234    Basename 01/03/12 0550  NA 138  K 3.9  CL 104  CO2 24  BUN 9  CREATININE 0.96  GLUCOSE 126*  CALCIUM 8.7   No results found for this basename: LABPT:2,INR:2 in the last 72 hours  Assessment/Plan: Labs stable - lle - mobilize with PT - hl IV - dc mon   Tracy Johnston 01/03/2012, 8:29 AM

## 2012-01-03 NOTE — Progress Notes (Signed)
Physical Therapy Treatment Patient Details Name: Tracy Johnston MRN: [id number] DOB: [birth year] Today's Date: 01/03/2012 Time: 1020-1043 PT Time Calculation (min): 23 min  PT Assessment / Plan / Recommendation Comments on Treatment Session  Pt able to ambulate ~15' this session session but becomes very anxious & requires max encouragement throughout.  If pt does not progress with mobility she may need ST-SNF.        Follow Up Recommendations  Home health PT;Supervision - Intermittent     Does the patient have the potential to tolerate intense rehabilitation     Barriers to Discharge        Equipment Recommendations  None recommended by PT    Recommendations for Other Services    Frequency 7X/week   Plan Discharge plan remains appropriate    Precautions / Restrictions Precautions Precautions: Fall;Posterior Hip Precaution Booklet Issued: Yes (comment) Precaution Comments: Educated pt on posterior hip precautions.  Restrictions RLE Weight Bearing: Weight bearing as tolerated   Pertinent Vitals/Pain 7/10 Lt hip.  Premedicated.  Ice applied.      Mobility  Bed Mobility Bed Mobility: Not assessed Transfers Transfers: Sit to Stand;Stand to Sit Sit to Stand: 1: +2 Total assist;With upper extremity assist;From chair/3-in-1 Sit to Stand: Patient Percentage: 50% Stand to Sit: 2: Max assist;With upper extremity assist;With armrests;To chair/3-in-1 Details for Transfer Assistance: Cues for initiation, hand placement, & LE positioning.  (A) to achieve standing, anterior translation of trunk over BOS, balance, & controlled descent.  Pt very anxious & yelling out in pain.     Ambulation/Gait Ambulation/Gait Assistance: 3: Mod assist (+2 to follow with recliner) Ambulation Distance (Feet): 15 Feet Assistive device: Rolling walker Ambulation/Gait Assistance Details: Max encouragement.  Cues for sequencing, technique, & safe RW management.  Cues to relax & take deep breathes.   Gait  Pattern: Step-to pattern;Trunk flexed;Antalgic;Decreased stance time - right;Decreased step length - left;Decreased weight shift to right      PT Goals Acute Rehab PT Goals Time For Goal Achievement: 01/09/12 Potential to Achieve Goals: Good Pt will Roll Supine to Left Side: with supervision Pt will go Supine/Side to Sit: with supervision Pt will go Sit to Supine/Side: with supervision Pt will go Sit to Stand: with supervision PT Goal: Sit to Stand - Progress: Progressing toward goal Pt will go Stand to Sit: with supervision PT Goal: Stand to Sit - Progress: Progressing toward goal Pt will Transfer Bed to Chair/Chair to Bed: with supervision Pt will Ambulate: 16 - 50 feet;with supervision PT Goal: Ambulate - Progress: Progressing toward goal  Visit Information  Last PT Received On: 01/03/12 Assistance Needed: +1    Subjective Data      Cognition  Overall Cognitive Status: No family/caregiver present to determine baseline cognitive functioning Area of Impairment: Executive functioning Arousal/Alertness: Awake/alert Orientation Level: Appears intact for tasks assessed Behavior During Session: Anxious Executive Functioning: decreased self control of behavior     Balance     End of Session PT - End of Session Equipment Utilized During Treatment: Gait belt Activity Tolerance: Patient limited by fatigue Patient left: in chair;with call bell/phone within reach     [place], [place] [phone] 01/03/2012

## 2012-01-03 NOTE — Progress Notes (Signed)
Occupational Therapy Evaluation Patient Details Name: Tracy Johnston MRN: [id number] DOB: [birth year] Today's Date: 01/03/2012 Time: 7829-5621 OT Time Calculation (min): 28 min  OT Assessment / Plan / Recommendation Clinical Impression  61 yo s/p R THA with posterior precautions. Pt has caregiver 5 days/wk from 10 - 5. Pt plans to go home, but may need SNF if she does not progress well. Required Max A with transfer today and is max a with all LB ADL. Pt will benefit from skilled OT services to max independece with ADL and functioanl moiblity for ADL to facilitate D/C to next venue of care.    OT Assessment  Patient needs continued OT Services    Follow Up Recommendations  Home health OT;SNF (pending progress)    Barriers to Discharge None    Equipment Recommendations    AE   Recommendations for Other Services    Frequency  Min 2X/week    Precautions / Restrictions Precautions Precautions: Fall;Posterior Hip Precaution Booklet Issued: Yes (comment) Precaution Comments: Educated pt on posterior hip precautions.  Restrictions RLE Weight Bearing: Weight bearing as tolerated   Pertinent Vitals/Pain 10. nsg notified.    ADL  Eating/Feeding: Independent Where Assessed - Eating/Feeding: Chair Grooming: Set up Where Assessed - Grooming: Supported sitting Upper Body Bathing: Set up Where Assessed - Upper Body Bathing: Supported sitting Lower Body Bathing: Maximal assistance Where Assessed - Lower Body Bathing: Supported sit to stand Upper Body Dressing: Set up Where Assessed - Upper Body Dressing: Unsupported sitting Lower Body Dressing: Maximal assistance Where Assessed - Lower Body Dressing: Supported sit to Pharmacist, hospital: Maximal assistance Toilet Transfer Method: Sit to stand;Stand pivot Acupuncturist: Bedside commode Toileting - Clothing Manipulation and Hygiene: Moderate assistance Where Assessed - Toileting Clothing Manipulation and Hygiene:  Standing Equipment Used: Gait belt;Rolling walker Transfers/Ambulation Related to ADLs: Max a with increased time. Pt yellig. ADL Comments: unable to recall 3 hip precautions. Has reacher     OT Diagnosis: Generalized weakness;Acute pain  OT Problem List: Decreased strength;Decreased range of motion;Decreased activity tolerance;Decreased safety awareness;Decreased knowledge of use of DME or AE;Decreased knowledge of precautions;Pain OT Treatment Interventions: Self-care/ADL training;Therapeutic exercise;Energy conservation;DME and/or AE instruction;Therapeutic activities;Patient/family education   OT Goals Acute Rehab OT Goals OT Goal Formulation: With patient Time For Goal Achievement: 01/17/12 Potential to Achieve Goals: Good ADL Goals Pt Will Perform Lower Body Bathing: with min assist;Sit to stand from chair;with adaptive equipment;with cueing (comment type and amount);Unsupported ADL Goal: Lower Body Bathing - Progress: Goal set today Pt Will Perform Lower Body Dressing: with min assist;Sit to stand from chair;Unsupported;with adaptive equipment;with cueing (comment type and amount) ADL Goal: Lower Body Dressing - Progress: Goal set today Pt Will Transfer to Toilet: with modified independence;Stand pivot transfer;with DME;Ambulation;Maintaining hip precautions ADL Goal: Toilet Transfer - Progress: Goal set today Pt Will Perform Toileting - Clothing Manipulation: with modified independence;Standing ADL Goal: Toileting - Clothing Manipulation - Progress: Goal set today Pt Will Perform Toileting - Hygiene: with modified independence;Sit to stand from 3-in-1/toilet ADL Goal: Toileting - Hygiene - Progress: Goal set today Additional ADL Goal #1: Pt will state 3/3 hip precautions independently. ADL Goal: Additional Goal #1 - Progress: Goal set today  Visit Information  Last OT Received On: 01/03/12 Assistance Needed: +1    Subjective Data      Prior Functioning     Home  Living Lives With: Other (Comment) (Grand daughter) Available Help at Discharge: Family;Personal care attendant (   ) Type of Home: House  Home Access: Level entry Home Layout: One level Bathroom Shower/Tub: Engineer, manufacturing systems: Handicapped height Home Adaptive Equipment: Bedside commode/3-in-1;Walker - rolling Prior Function Level of Independence: Independent with assistive device(s) Able to Take Stairs?: No Driving: No Communication Communication: No difficulties Dominant Hand: Right         Vision/Perception  WFL   Cognition  Overall Cognitive Status: No family/caregiver present to determine baseline cognitive functioning Area of Impairment: Executive functioning Arousal/Alertness: Awake/alert Orientation Level: Appears intact for tasks assessed Behavior During Session: Anxious Executive Functioning: decrased self control of behavior. decreased indsight    Extremity/Trunk Assessment Right Upper Extremity Assessment RUE ROM/Strength/Tone: WFL for tasks assessed RUE Sensation: WFL - Light Touch;WFL - Proprioception RUE Coordination: WFL - gross/fine motor Left Upper Extremity Assessment LUE ROM/Strength/Tone: WFL for tasks assessed LUE Sensation: WFL - Light Touch;WFL - Proprioception LUE Coordination: WFL - gross/fine motor Right Lower Extremity Assessment RLE ROM/Strength/Tone: Deficits;Due to pain;Due to precautions Left Lower Extremity Assessment LLE ROM/Strength/Tone: WFL for tasks assessed Trunk Assessment Trunk Assessment: Normal     Mobility Transfers Transfers: Sit to Stand;Stand to Sit Sit to Stand: 2: Max assist;From chair/3-in-1;With upper extremity assist Stand to Sit: 2: Max assist;With upper extremity assist;To chair/3-in-1 Details for Transfer Assistance: Max vc for positioning. Mod a to transition hips forward in chair prior to transfer. Pt yelling during mobility and says "i just do that"               Balance  S   End of  Session OT - End of Session Equipment Utilized During Treatment: Gait belt Activity Tolerance: Patient tolerated treatment well Patient left: in chair;with call bell/phone within reach;with nursing in room Nurse Communication: Patient requests pain meds  GO     Tracy Johnston,HILLARY 01/03/2012, 1:11 PM [facility], OTR/L  [phone] 01/03/2012

## 2012-01-04 LAB — CBC
MCH: 27.3 pg (ref 26.0–34.0)
MCHC: 34.1 g/dL (ref 30.0–36.0)
Platelets: 232 10*3/uL (ref 150–400)
RBC: 3.77 MIL/uL — ABNORMAL LOW (ref 3.87–5.11)
RDW: 16.1 % — ABNORMAL HIGH (ref 11.5–15.5)

## 2012-01-04 MED ORDER — ALLOPURINOL 300 MG PO TABS
300.0000 mg | ORAL_TABLET | Freq: Every day | ORAL | Status: DC
  Administered 2012-01-04 – 2012-01-05 (×2): 300 mg via ORAL
  Filled 2012-01-04 (×2): qty 1

## 2012-01-04 MED ORDER — HYDROCODONE-ACETAMINOPHEN 10-325 MG PO TABS
1.0000 | ORAL_TABLET | Freq: Four times a day (QID) | ORAL | Status: DC
  Administered 2012-01-04 – 2012-01-05 (×4): 1 via ORAL
  Filled 2012-01-04 (×4): qty 1

## 2012-01-04 NOTE — Progress Notes (Signed)
Subjective: Pt stable - requesting stronger pain meds  Objective: Vital signs in last 24 hours: Temp:  [98.3 F (36.8 C)-98.8 F (37.1 C)] 98.3 F (36.8 C) (12/08 0522) Pulse Rate:  [90-102] 101  (12/08 0522) Resp:  [16-18] 16  (12/08 0522) BP: (99-106)/(54-71) 106/71 mmHg (12/08 0522) SpO2:  [93 %-98 %] 94 % (12/08 0522)  Intake/Output from previous day: 12/07 0701 - 12/08 0700 In: 240 [P.O.:240] Out: 600 [Urine:600] Intake/Output this shift:    Exam:  Neurovascular intact Sensation intact distally Intact pulses distally Dorsiflexion/Plantar flexion intact  Labs:  Basename 01/04/12 0500 01/03/12 0550  HGB 10.3* 10.2*    Basename 01/04/12 0500 01/03/12 0550  WBC 17.8* 13.7*  RBC 3.77* 3.77*  HCT 30.2* 30.5*  PLT 232 234    Basename 01/03/12 0550  NA 138  K 3.9  CL 104  CO2 24  BUN 9  CREATININE 0.96  GLUCOSE 126*  CALCIUM 8.7   No results found for this basename: LABPT:2,INR:2 in the last 72 hours  Assessment/Plan: Pt stable - requesting stronger pain meds - wbc gradual increase - hgb ok - will change norco to 10 from 7.5   - possible dc am   DEAN,GREGORY SCOTT 01/04/2012, 9:43 AM

## 2012-01-04 NOTE — Progress Notes (Addendum)
Physical Therapy Treatment Patient Details Name: Tracy Johnston MRN: [id number] DOB: 22-Jan-1951 Today's Date: 01/04/2012 Time: 1000-1030 PT Time Calculation (min): 30 min  PT Assessment / Plan / Recommendation Comments on Treatment Session  Pt with very poor mobility today.  Very anxious & with uncontrolled behavior (crying + yelling) with all mobility.  Increased time for all mobility as evident in it took 30 minutes for supine>sit & bed>recliner.  Refused ambulation.  Due to pt's slow progress this clinician feels pt would strongly benefit from ST-SNF to maximize functional recovery + independence prior to d/c home. however, pt will more than likely refuse.      Follow Up Recommendations  SNF;Other (comment);Supervision/Assistance - 24 hour;Home health PT (HHPT + 24 hr (A) if refuses SNF)     Does the patient have the potential to tolerate intense rehabilitation     Barriers to Discharge        Equipment Recommendations  None recommended by PT    Recommendations for Other Services    Frequency 7X/week   Plan Discharge plan needs to be updated;Frequency remains appropriate    Precautions / Restrictions Precautions Precautions: Fall;Posterior Hip Precaution Comments: Educated pt on posterior hip precautions.  Restrictions Weight Bearing Restrictions: No RLE Weight Bearing: Weight bearing as tolerated       Mobility  Bed Mobility Bed Mobility: Supine to Sit;Sitting - Scoot to Edge of Bed Supine to Sit: HOB elevated;3: Mod assist;With rails (HOB elevated ~40 degrees) Sitting - Scoot to Edge of Bed: 2: Max assist;With rail Details for Bed Mobility Assistance: Pt keeping HOB elevated ~40 degrees due to she states she will be proped up on multiple pillows at home.  Max (A) with use of draw pad to pivot hips around to EOB & scoot to EOB.  Pt with uncontrolled behavior (yelling & crying) during entire transition.   Transfers Transfers: Sit to Stand;Stand to Sit Sit to Stand: 4: Min  guard;With upper extremity assist;From bed Stand to Sit: 4: Min assist;With upper extremity assist;With armrests;To chair/3-in-1 Details for Transfer Assistance: Cues for hand placement & technique but pt able to achieve standing without any physical (A) today.   For stand pivot from bed>recliner pt very anxious & not following any cues/commands to increase ease of transfer.  Pt unable to use UE's sufficiently enough to advance LE's.  Recliner had to be positioned directly behind pt rather than pt pivoting around.   Ambulation/Gait Ambulation/Gait Assistance: Not tested (comment) (pt refused)    Exercises     PT Diagnosis:    PT Problem List:   PT Treatment Interventions:     PT Goals Acute Rehab PT Goals Time For Goal Achievement: 01/09/12 Potential to Achieve Goals: Good Pt will Roll Supine to Left Side: with supervision Pt will go Supine/Side to Sit: with supervision PT Goal: Supine/Side to Sit - Progress: Not met Pt will go Sit to Supine/Side: with supervision Pt will go Sit to Stand: with supervision PT Goal: Sit to Stand - Progress: Progressing toward goal Pt will go Stand to Sit: with supervision PT Goal: Stand to Sit - Progress: Progressing toward goal Pt will Transfer Bed to Chair/Chair to Bed: with supervision PT Transfer Goal: Bed to Chair/Chair to Bed - Progress: Not progressing Pt will Ambulate: 16 - 50 feet;with supervision  Visit Information  Last PT Received On: 01/04/12 Assistance Needed: +1    Subjective Data      Cognition  Overall Cognitive Status: No family/caregiver present to determine baseline cognitive functioning  Area of Impairment: Executive functioning Arousal/Alertness: Awake/alert Orientation Level: Appears intact for tasks assessed Behavior During Session: Anxious Executive Functioning: decreased self control of behavior     Balance     End of Session PT - End of Session Equipment Utilized During Treatment: Gait belt Activity Tolerance:  Patient limited by pain Patient left: in chair;with call bell/phone within reach Nurse Communication: Mobility status     Verdell Face, [place] [phone] 01/04/2012

## 2012-01-04 NOTE — Progress Notes (Deleted)
Dr Sherlean Foot called per MD request re:pt. (PA called first and states Dr Sherlean Foot is on call).  Con't same activity with abcess as before.

## 2012-01-04 NOTE — Progress Notes (Signed)
Patient refused 12 Midnight dose of Vicodin.

## 2012-01-04 NOTE — Progress Notes (Signed)
Agree with updated d/c plan 01/04/2012 Milana Kidney DPT PAGER: [phone] OFFICE: [phone]

## 2012-01-05 ENCOUNTER — Encounter (HOSPITAL_COMMUNITY): Payer: Self-pay | Admitting: Orthopaedic Surgery

## 2012-01-05 LAB — CBC
Platelets: 219 10*3/uL (ref 150–400)
RDW: 15.8 % — ABNORMAL HIGH (ref 11.5–15.5)
WBC: 11.2 10*3/uL — ABNORMAL HIGH (ref 4.0–10.5)

## 2012-01-05 MED ORDER — HYDROCODONE-ACETAMINOPHEN 10-325 MG PO TABS: 1.0000 | ORAL_TABLET | Freq: Four times a day (QID) | ORAL | Status: DC

## 2012-01-05 MED ORDER — METHOCARBAMOL 500 MG PO TABS: 500.0000 mg | ORAL_TABLET | Freq: Four times a day (QID) | ORAL | Status: DC | PRN

## 2012-01-05 NOTE — Progress Notes (Signed)
Physical Therapy Treatment Patient Details Name: Tracy Johnston MRN: [id number] DOB: Jan 31, 1950 Today's Date: 01/05/2012 Time: 4098-1191 PT Time Calculation (min): 32 min  PT Assessment / Plan / Recommendation Comments on Treatment Session  pt rpesents with R THA.  pt making slow prgress, but adament about D/C to home.  pt will need HHPT and 24hr S at home.      Follow Up Recommendations  Home health PT;Supervision/Assistance - 24 hour     Does the patient have the potential to tolerate intense rehabilitation     Barriers to Discharge        Equipment Recommendations  None recommended by PT    Recommendations for Other Services    Frequency 7X/week   Plan Discharge plan needs to be updated;Frequency remains appropriate    Precautions / Restrictions Precautions Precautions: Fall;Posterior Hip Precaution Comments: Reviewed posterior hip precautions. pt only able to verbalize 1 of 3 hip precautions.   Restrictions Weight Bearing Restrictions: Yes RLE Weight Bearing: Weight bearing as tolerated   Pertinent Vitals/Pain Indicates very painful, but does not rate.      Mobility  Bed Mobility Bed Mobility: Not assessed Supine to Sit: 5: Supervision;With rails;HOB flat;Other (comment) (increased time needed) Sit to Supine: 4: Min guard Transfers Transfers: Sit to Stand;Stand to Sit Sit to Stand: 4: Min guard;With upper extremity assist;From chair/3-in-1;With armrests Stand to Sit: 4: Min guard;With upper extremity assist;To chair/3-in-1 Details for Transfer Assistance: pt needs increased time to complete and very particular about wanting to do it her own way.  pt talks to herself the whole way through each transfer.   Ambulation/Gait Ambulation/Gait Assistance: 4: Min guard Ambulation Distance (Feet): 40 Feet Assistive device: Rolling walker Ambulation/Gait Assistance Details: cues for upright posture, positioning within RW.  pt not receptive to cueing and takes multiple standing  rest breaks.  pt ambulates extremely slow and was timed at for 40'.   Gait Pattern: Step-to pattern;Trunk flexed;Antalgic;Decreased stance time - right;Decreased step length - left;Decreased weight shift to right Stairs: No Wheelchair Mobility Wheelchair Mobility: No    Exercises     PT Diagnosis:    PT Problem List:   PT Treatment Interventions:     PT Goals Acute Rehab PT Goals Time For Goal Achievement: 01/09/12 PT Goal: Sit to Stand - Progress: Progressing toward goal PT Goal: Stand to Sit - Progress: Progressing toward goal PT Goal: Ambulate - Progress: Progressing toward goal  Visit Information  Last PT Received On: 01/05/12 Assistance Needed: +1    Subjective Data  Subjective: pt notes wanting to D/C home.     Cognition  Overall Cognitive Status: Appears within functional limits for tasks assessed/performed Area of Impairment: Safety/judgement Arousal/Alertness: Awake/alert Orientation Level: Appears intact for tasks assessed Behavior During Session: [facility] for tasks performed Cognition - Other Comments: pt self-limits mobility and seems not to tolerate pain well.      Balance  Balance Balance Assessed: No  End of Session PT - End of Session Equipment Utilized During Treatment: Gait belt Activity Tolerance: Patient limited by pain Patient left: in chair;with call bell/phone within reach Nurse Communication: Mobility status   GP     Sunny Schlein, [place] [phone] 01/05/2012, 11:58 AM

## 2012-01-05 NOTE — Discharge Summary (Signed)
Physician Discharge Summary  Patient ID: Tracy Johnston MRN: [id number] DOB/AGE: 1951-01-18 61 y.o.  Admit date: 01/02/2012 Discharge date: 01/05/2012  Admission Diagnoses:  Avascular necrosis of right femoral head  Discharge Diagnoses:  Principal Problem:  *Avascular necrosis of right femoral head   Past Medical History  Diagnosis Date  . Tobacco abuse   . Hypertension   . COPD (chronic obstructive pulmonary disease)   . Mitral regurgitation     Mild to moderate. Normal EF 09/2010  . Atrial fibrillation     chronic. Was first diagnosed in her early 75s.  . Insomnia   . Cancer 2003ish    , cervix  . Malignant neoplasm of kidney     rt removed 2003  . Arthritis     Surgeries: Procedure(s): TOTAL HIP ARTHROPLASTY on 01/02/2012   Consultants (if any):  none  Discharged Condition: Improved  Hospital Course: Tracy Johnston is an 61 y.o. female who was admitted 01/02/2012 with a diagnosis of Avascular necrosis of right femoral head and went to the operating room on 01/02/2012 and underwent the above named procedures.    She was given perioperative antibiotics:  Anti-infectives     Start     Dose/Rate Route Frequency Ordered Stop   01/02/12 1400   ceFAZolin (ANCEF) IVPB 1 g/50 mL premix        1 g 100 mL/hr over 30 Minutes Intravenous Every 6 hours 01/02/12 1256 01/02/12 2156   01/01/12 1323   ceFAZolin (ANCEF) IVPB 2 g/50 mL premix        2 g 100 mL/hr over 30 Minutes Intravenous 60 min pre-op 01/01/12 1323 01/02/12 0739        .  She was given sequential compression devices, early ambulation, and Xeralto for DVT prophylaxis.  She benefited maximally from the hospital stay and there were no complications.    Recent vital signs:  Filed Vitals:   01/05/12 0534  BP: 89/60  Pulse: 100  Temp: 97.3 F (36.3 C)  Resp: 16    Recent laboratory studies:  Lab Results  Component Value Date   HGB 9.3* 01/05/2012   HGB 10.3* 01/04/2012   HGB 10.2* 01/03/2012   Lab  Results  Component Value Date   WBC 11.2* 01/05/2012   PLT 219 01/05/2012   Lab Results  Component Value Date   INR 1.03 12/29/2011   Lab Results  Component Value Date   NA 138 01/03/2012   K 3.9 01/03/2012   CL 104 01/03/2012   CO2 24 01/03/2012   BUN 9 01/03/2012   CREATININE 0.96 01/03/2012   GLUCOSE 126* 01/03/2012    Discharge Medications:     Medication List     As of 01/05/2012  9:07 AM    STOP taking these medications         HYDROcodone-acetaminophen 7.5-325 MG per tablet   Commonly known as: NORCO      TAKE these medications         albuterol 0.63 MG/3ML nebulizer solution   Commonly known as: ACCUNEB   Take 1 ampule by nebulization every 6 (six) hours as needed. For shortness of breath      allopurinol 300 MG tablet   Commonly known as: ZYLOPRIM   Take 300 mg by mouth daily.      diltiazem 240 MG 24 hr capsule   Commonly known as: CARDIZEM CD   Take 1 capsule (240 mg total) by mouth daily.      docusate sodium 100 MG  capsule   Commonly known as: COLACE   Take 100 mg by mouth 2 (two) times daily.      Fluticasone-Salmeterol 250-50 MCG/DOSE Aepb   Commonly known as: ADVAIR   Inhale 2 puffs into the lungs every 12 (twelve) hours.      HYDROcodone-acetaminophen 10-325 MG per tablet   Commonly known as: NORCO   Take 1 tablet by mouth 4 (four) times daily.      methocarbamol 500 MG tablet   Commonly known as: ROBAXIN   Take 500 mg by mouth every 6 (six) hours as needed. For pain      methocarbamol 500 MG tablet   Commonly known as: ROBAXIN   Take 1 tablet (500 mg total) by mouth every 6 (six) hours as needed.      olmesartan-hydrochlorothiazide 40-12.5 MG per tablet   Commonly known as: BENICAR HCT   Take 1 tablet by mouth daily.      polyethylene glycol packet   Commonly known as: MIRALAX / GLYCOLAX   Take 17 g by mouth daily.      XARELTO 20 MG Tabs   Generic drug: Rivaroxaban   Take 20 mg by mouth daily.        Diagnostic Studies: Dg Chest  2 View  12/29/2011  *RADIOLOGY REPORT*  Clinical Data: Preoperative chest radiograph.  Hip replacement.  CHEST - 2 VIEW  Comparison: 10/17/2011.CT 10/11/2010.  Findings: Cardiopericardial silhouette appears within normal limits.  Emphysema.  Partially visualized plate and screw fixation of the proximal left humerus.  No airspace disease.  No pleural effusion.  Mediastinal contours appear within normal limits and unchanged from prior.  Chronic scarring/atelectasis in the right middle lobe seen on prior CT.  IMPRESSION: No acute cardiopulmonary disease. Chronic changes of the chest.   Original Report Authenticated By: Andreas Newport, M.D.    Dg Pelvis Portable  01/02/2012  *RADIOLOGY REPORT*  Clinical Data: Postop right hip.  PORTABLE PELVIS,PORTABLE RIGHT HIP - 1 VIEW  Comparison: 10/17/2011.  Findings: Lateral view limited.  Right hip prosthesis in satisfactory position without complication noted.  Remote left hip replacement without complication.  IMPRESSION: Right hip replacement in satisfactory position without complication noted.  Lateral view is limited.   Original Report Authenticated By: Lacy Duverney, M.D.    Dg Hip Portable 1 View Right  01/02/2012  *RADIOLOGY REPORT*  Clinical Data: Postop right hip.  PORTABLE PELVIS,PORTABLE RIGHT HIP - 1 VIEW  Comparison: 10/17/2011.  Findings: Lateral view limited.  Right hip prosthesis in satisfactory position without complication noted.  Remote left hip replacement without complication.  IMPRESSION: Right hip replacement in satisfactory position without complication noted.  Lateral view is limited.   Original Report Authenticated By: Lacy Duverney, M.D.     Disposition: 06-Home-Health Care Svc      Discharge Orders    Future Orders Please Complete By Expires   Diet - low sodium heart healthy      Call MD / Call 911      Comments:   If you experience chest pain or shortness of breath, CALL 911 and be transported to the hospital emergency room.  If you  develope a fever above 101 F, pus (white drainage) or increased drainage or redness at the wound, or calf pain, call your surgeon's office.   Constipation Prevention      Comments:   Drink plenty of fluids.  Prune juice may be helpful.  You may use a stool softener, such as Colace (over the counter) 100  mg twice a day.  Use MiraLax (over the counter) for constipation as needed.   Increase activity slowly as tolerated      Discharge instructions      Comments:   Keep hip incision dry for 5 days post op then may wet while bathing. Therapy daily . Call if fever or chills or increased drainage. Go to ER if acutely short of breath or call for ambulance. Return for follow up in 2 weeks. May full weight bear on the surgical leg unless told otherwise. In house walking for first 2 weeks.   Driving restrictions      Comments:   No driving   Follow the hip precautions as taught in Physical Therapy         Follow-up Information    Follow up with YATES,MARK C, MD. Schedule an appointment as soon as possible for a visit in 2 weeks.   Contact information:   [address] [place] [address] [phone]           Signed: Wende Neighbors 01/05/2012, 9:07 AM

## 2012-01-05 NOTE — Progress Notes (Signed)
Occupational Therapy Treatment Patient Details Name: Tracy Johnston MRN: [id number] DOB: Nov 01, 1950 Today's Date: 01/05/2012 Time: 4098-1191 OT Time Calculation (min): 39 min  OT Assessment / Plan / Recommendation Comments on Treatment Session pt insisitent on going home.  states she has needed asssit.  OT explained pt still needing A with ADL activity. Pt not concerened stating aide will help her    Follow Up Recommendations  Home health OT       Equipment Recommendations  None recommended by OT       Frequency Min 2X/week      Precautions / Restrictions Precautions Precautions: Fall;Posterior Hip Precaution Comments: Educated pt on posterior hip precautions.  Restrictions Weight Bearing Restrictions: No RLE Weight Bearing: Weight bearing as tolerated       ADL  Toilet Transfer Method: Sit to stand;Stand pivot;Other (comment) (performed bed to chair transfer slowly) Toileting - Clothing Manipulation and Hygiene: Simulated;Minimal assistance Where Assessed - Toileting Clothing Manipulation and Hygiene: Standing;Other (comment) (increased time)      OT Goals ADL Goals ADL Goal: Toilet Transfer - Progress: Progressing toward goals ADL Goal: Toileting - Clothing Manipulation - Progress: Progressing toward goals ADL Goal: Additional Goal #1 - Progress: Progressing toward goals  Visit Information  Last OT Received On: 01/05/12    Subjective Data  Subjective: i am leaving today and i am not staying another day      Cognition  Overall Cognitive Status: Appears within functional limits for tasks assessed/performed Area of Impairment: Safety/judgement Arousal/Alertness: Awake/alert Orientation Level: Appears intact for tasks assessed Behavior During Session: [facility] for tasks performed    Mobility  Shoulder Instructions Bed Mobility Bed Mobility: Supine to Sit Supine to Sit: 5: Supervision;With rails;HOB flat;Other (comment) (increased time needed) Sit to Supine: 4: Min  guard Transfers Transfers: Sit to Stand;Stand to Sit Sit to Stand: 4: Min guard;With upper extremity assist;From bed Stand to Sit: 4: Min guard;With upper extremity assist;To chair/3-in-1 Details for Transfer Assistance: increased time for all movements             End of Session OT - End of Session Activity Tolerance: Patient tolerated treatment well Patient left: in chair  GO     Meher Kucinski, [place] 01/05/2012, 10:02 AM

## 2012-01-05 NOTE — Progress Notes (Signed)
CSW met with pt re: SNF placement.  Pt admitted from home where she lives alone but has an aid 6-8 hours a day.   Patient is not interested in [facility] placement and would like to return home with home health. [facility]. Clinical Social Worker will sign off for now as social work intervention is no longer needed. Please consult [place] again if new need arises.   Sabino Niemann, MSW, [employer] [phone]

## 2012-01-05 NOTE — Progress Notes (Signed)
Subjective: 3 Days Post-Op Procedure(s) (LRB): TOTAL HIP ARTHROPLASTY (Right) Patient reports pain as moderate. Pain better controlled with increased hydrocodone.  States PT didn't go well yesterday because she was in pain and felt rushed.  Did better later in the day and states she gave herself a bath.  Has aide at home 6 hrs per day.  Did well after last THR in Sept. 2013 going home with HHPT.  Wants "Kathlene November" with [facility] for PT. Wants to go home today Objective: Vital signs in last 24 hours: Temp:  [97.3 F (36.3 C)-98.5 F (36.9 C)] 97.3 F (36.3 C) (12/09 0534) Pulse Rate:  [100-110] 100  (12/09 0534) Resp:  [16] 16  (12/09 0534) BP: (89-102)/(60) 89/60 mmHg (12/09 0534) SpO2:  [95 %-97 %] 95 % (12/09 0849)  Intake/Output from previous day:   Intake/Output this shift:     Basename 01/05/12 0550 01/04/12 0500 01/03/12 0550  HGB 9.3* 10.3* 10.2*    Basename 01/05/12 0550 01/04/12 0500  WBC 11.2* 17.8*  RBC 3.44* 3.77*  HCT 27.1* 30.2*  PLT 219 232    Basename 01/03/12 0550  NA 138  K 3.9  CL 104  CO2 24  BUN 9  CREATININE 0.96  GLUCOSE 126*  CALCIUM 8.7   No results found for this basename: LABPT:2,INR:2 in the last 72 hours  Neurovascular intact Dorsiflexion/Plantar flexion intact Incision: no drainage  Assessment/Plan: 3 Days Post-Op Procedure(s) (LRB): TOTAL HIP ARTHROPLASTY (Right) Up with therapy Discharge home with home health Dressing change prior to discharge.  Colette Dicamillo M 01/05/2012, 8:57 AM

## 2012-08-27 ENCOUNTER — Other Ambulatory Visit: Payer: Self-pay | Admitting: Cardiology

## 2012-09-09 ENCOUNTER — Telehealth (INDEPENDENT_AMBULATORY_CARE_PROVIDER_SITE_OTHER): Payer: Self-pay | Admitting: General Surgery

## 2012-09-09 ENCOUNTER — Encounter (INDEPENDENT_AMBULATORY_CARE_PROVIDER_SITE_OTHER): Payer: Self-pay | Admitting: General Surgery

## 2012-09-09 ENCOUNTER — Ambulatory Visit (INDEPENDENT_AMBULATORY_CARE_PROVIDER_SITE_OTHER): Payer: Medicare Other | Admitting: General Surgery

## 2012-09-09 VITALS — BP 140/90 | HR 68 | Temp 97.8°F | Resp 14 | Ht 63.0 in | Wt 176.8 lb

## 2012-09-09 DIAGNOSIS — K432 Incisional hernia without obstruction or gangrene: Secondary | ICD-10-CM

## 2012-09-09 NOTE — Progress Notes (Signed)
Patient ID: Tracy Johnston, female   DOB: 1950/10/24, 62 y.o.   MRN: [id number]  Chief Complaint  Patient presents with  . New Evaluation    eval INC hernia    HPI  Tracy Johnston is a 62 y.o. female.  She is referred by Dr. Wyvonnia Lora in [place] for evaluation of a right flank incisional hernia.  She has a history of right nephrectomy for renal cell carcinoma in 2004 by Dr. Vernie Ammons.  . She says she has no recurrence but is no longer being followed. She says that she has a bulge in her right flank for about 2 months. It has been getting bigger. She has mild pain. No gastrointestinal symptoms. Appetite normal. GI function normal.  Significant comorbidities include atrial fibrillation on xaralto and cardizem, tobacco abuse, ongoing, hypertension, claudication, COPD and asthma, arthritis, status post bilateral total hip replacement. The only other abdominal surgery she had is bilateral tubal ligation.  HPI  Past Medical History  Diagnosis Date  . Tobacco abuse   . Hypertension   . COPD (chronic obstructive pulmonary disease)   . Mitral regurgitation     Mild to moderate. Normal EF 09/2010  . Atrial fibrillation     chronic. Was first diagnosed in her early 27s.  . Insomnia   . Cancer 2003ish    , cervix  . Malignant neoplasm of kidney     rt removed 2003  . Arthritis   . Heart murmur     Past Surgical History  Procedure Laterality Date  . Nephrectomy  2003  . Arm fracture      rods, pins and bone graft.- to left arm from hip  . Tubal ligation    . Bone graft hip iliac crest      to left arm  . Total hip arthroplasty  10/17/2011    Procedure: TOTAL HIP ARTHROPLASTY;  Surgeon: Eldred Manges, MD;  Location: [facility] OR;  Service: Orthopedics;  Laterality: Left;  Left Total Hip Arthroplasty  . Cervical conization w/bx    . Total hip arthroplasty  01/02/2012    Procedure: TOTAL HIP ARTHROPLASTY;  Surgeon: Eldred Manges, MD;  Location: [facility] OR;  Service: Orthopedics;  Laterality: Right;  Right  Total Hip Arthroplasty    Family History  Problem Relation Age of Onset  . Diabetes Mother   . Cancer Mother     ovarian    Social History History  Substance Use Topics  . Smoking status: Former Smoker -- 1.00 packs/day for 40 years    Types: Cigarettes    Quit date: 10/14/2010  . Smokeless tobacco: Never Used     Comment: just quit since getting out of hospital   . Alcohol Use: Yes     Comment: occasions only 1 glass of wine    Allergies  Allergen Reactions  . Aspirin Other (See Comments)    Bleeding at higher doses  . Latex Rash    When she wears gloves    Current Outpatient Prescriptions  Medication Sig Dispense Refill  . albuterol (ACCUNEB) 0.63 MG/3ML nebulizer solution Take 1 ampule by nebulization every 6 (six) hours as needed. For shortness of breath      . allopurinol (ZYLOPRIM) 300 MG tablet Take 300 mg by mouth daily.      Marland Kitchen diltiazem (CARDIZEM CD) 240 MG 24 hr capsule Take 1 capsule (240 mg total) by mouth daily.  30 capsule  6  . Fluticasone-Salmeterol (ADVAIR) 250-50 MCG/DOSE AEPB Inhale 2 puffs into the  lungs every 12 (twelve) hours.       Marland Kitchen olmesartan-hydrochlorothiazide (BENICAR HCT) 40-12.5 MG per tablet Take 1 tablet by mouth daily.        Marland Kitchen oxybutynin (DITROPAN) 5 MG tablet       . oxymorphone (OPANA) 10 MG tablet       . XARELTO 20 MG TABS TAKE 1 TABLET BY MOUTH DAILY.  30 tablet  0  . docusate sodium (COLACE) 100 MG capsule Take 100 mg by mouth 2 (two) times daily.        No current facility-administered medications for this visit.    Review of Systems Review of Systems  Constitutional: Negative for fever, chills and unexpected weight change.  HENT: Negative for hearing loss, congestion, sore throat, trouble swallowing and voice change.   Eyes: Negative for visual disturbance.  Respiratory: Positive for cough and shortness of breath. Negative for wheezing.   Cardiovascular: Negative for chest pain, palpitations and leg swelling.   Gastrointestinal: Positive for abdominal pain and abdominal distention. Negative for nausea, vomiting, diarrhea, constipation, blood in stool and anal bleeding.  Genitourinary: Negative for hematuria, vaginal bleeding and difficulty urinating.  Musculoskeletal: Negative for arthralgias.  Skin: Negative for rash and wound.  Neurological: Negative for seizures, syncope and headaches.  Hematological: Negative for adenopathy. Does not bruise/bleed easily.  Psychiatric/Behavioral: Negative for confusion.    Blood pressure 140/90, pulse 68, temperature 97.8 F (36.6 C), temperature source Temporal, resp. rate 14, height 5\' 3"  (1.6 m), weight 176 lb 12.8 oz (80.196 kg).  Physical Exam Physical Exam  Constitutional: She is oriented to person, place, and time. She appears well-developed and well-nourished. No distress.  HENT:  Head: Normocephalic and atraumatic.  Nose: Nose normal.  Mouth/Throat: No oropharyngeal exudate.  Eyes: Conjunctivae and EOM are normal. Pupils are equal, round, and reactive to light. Left eye exhibits no discharge. No scleral icterus.  Neck: Neck supple. No JVD present. No tracheal deviation present. No thyromegaly present.  Cardiovascular: Normal rate, regular rhythm, normal heart sounds and intact distal pulses.   No murmur heard. Pulmonary/Chest: Effort normal and breath sounds normal. No respiratory distress. She has no wheezes. She has no rales. She exhibits no tenderness.  Abdominal: Soft. Bowel sounds are normal. She exhibits no distension and no mass. There is no tenderness. There is no rebound and no guarding.  Right subcostal transverse incision extending around the flank. There is an anterolateral bulge, almost as big as a small football, anterior axillary line on the right. Seems reducible when supine. Minimally tender.No other hernias. Abdomen soft and nontender.short vertical incision suprapubic area from tubal ligation.  Musculoskeletal: She exhibits no  edema and no tenderness.  Lymphadenopathy:    She has no cervical adenopathy.  Neurological: She is alert and oriented to person, place, and time. She exhibits normal muscle tone. Coordination normal.  Skin: Skin is warm. No rash noted. She is not diaphoretic. No erythema. No pallor.  Psychiatric: She has a normal mood and affect. Her behavior is normal. Judgment and thought content normal.    Data Reviewed Office note from Dr. Margo Common  Assessment    Suspect incisional right flank hernia, minimally symptomatic at this time. She is at risk for progression in size, pain, and possible incarceration long-term.  Status post right nephrectomy for cancer, 2004.  Significant cardiac and pulmonary comorbidities, including COPD, atrial fibrillation on antiplatelet therapy, asthma, hypertension  Status post bilateral total hip replacement     Plan    Was a  long time talking about hernia repair we also talked about her comorbidities and that she is at increased risk for wound infection and recurrence of the hernia because of her pulmonary disease and smoking. She was strongly urged to stop smoking immediately  Scheduled for CT scan of abdomen and pelvis to define the size of the hernia defect. It may be that she might have to have this repaired laparoscopically in the lateral decubitus position and a large piece of mesh almost all the way back to the spine. Possibly will have to be repaired open.We will await CT scan to define this.   Return to see me after CT scan is done.       Angelia Mould. Derrell Lolling, M.D., [facility] General and Minimally invasive Surgery Breast and Colorectal Surgery Office:   [phone] Pager:   [phone]  09/09/2012, 11:47 AM

## 2012-09-09 NOTE — Patient Instructions (Signed)
It looks like you have a incisional hernia in your right lateral abdominal wall. There is no emergency, but this might get larger and become more painful in the future.  You are strongly encouraged to stop smoking immediately  She will be scheduled for a CT scan of the abdomen and pelvis to better define how big the defect in the muscle areas.  Return to see Dr. Derrell Lolling after the CT scan is done.

## 2012-09-09 NOTE — Telephone Encounter (Signed)
Spoke with pt and informed her that we have her set up for a CT scan at [facility] Radiology department on 09/13/12 with an arrival time of 7:45.  Explained that she would need to drink her first bottle of contrast at 6:00 and the second at 7:00.  Also explained that she cannot have any solid foods 4 hrs before the test.

## 2012-09-13 ENCOUNTER — Ambulatory Visit (HOSPITAL_COMMUNITY)
Admission: RE | Admit: 2012-09-13 | Discharge: 2012-09-13 | Disposition: A | Discharge status: Home / Self Care | Payer: Medicare Other | Source: Ambulatory Visit | Attending: General Surgery | Admitting: General Surgery

## 2012-09-13 DIAGNOSIS — K432 Incisional hernia without obstruction or gangrene: Secondary | ICD-10-CM

## 2012-09-13 DIAGNOSIS — K439 Ventral hernia without obstruction or gangrene: Secondary | ICD-10-CM | POA: Insufficient documentation

## 2012-09-13 DIAGNOSIS — Z905 Acquired absence of kidney: Secondary | ICD-10-CM | POA: Insufficient documentation

## 2012-09-13 DIAGNOSIS — I700 Atherosclerosis of aorta: Secondary | ICD-10-CM | POA: Insufficient documentation

## 2012-09-13 DIAGNOSIS — D259 Leiomyoma of uterus, unspecified: Secondary | ICD-10-CM | POA: Insufficient documentation

## 2012-09-13 DIAGNOSIS — K5730 Diverticulosis of large intestine without perforation or abscess without bleeding: Secondary | ICD-10-CM | POA: Insufficient documentation

## 2012-09-15 ENCOUNTER — Ambulatory Visit (INDEPENDENT_AMBULATORY_CARE_PROVIDER_SITE_OTHER): Payer: Medicare Other | Admitting: Cardiovascular Disease

## 2012-09-15 ENCOUNTER — Encounter: Payer: Self-pay | Admitting: Cardiovascular Disease

## 2012-09-15 VITALS — BP 128/92 | HR 74 | Ht 63.0 in | Wt 180.0 lb

## 2012-09-15 DIAGNOSIS — I10 Essential (primary) hypertension: Secondary | ICD-10-CM

## 2012-09-15 DIAGNOSIS — I4891 Unspecified atrial fibrillation: Secondary | ICD-10-CM

## 2012-09-15 DIAGNOSIS — I340 Nonrheumatic mitral (valve) insufficiency: Secondary | ICD-10-CM

## 2012-09-15 DIAGNOSIS — R079 Chest pain, unspecified: Secondary | ICD-10-CM

## 2012-09-15 DIAGNOSIS — I059 Rheumatic mitral valve disease, unspecified: Secondary | ICD-10-CM

## 2012-09-15 MED ORDER — RIVAROXABAN 20 MG PO TABS: 20.0000 mg | ORAL_TABLET | Freq: Every day | ORAL | Status: DC

## 2012-09-15 NOTE — Patient Instructions (Signed)
Continue all current medications. Your physician wants you to follow up in:  1 year.  You will receive a reminder letter in the mail one-two months in advance.  If you don't receive a letter, please call our office to schedule the follow up appointment   

## 2012-09-15 NOTE — Progress Notes (Signed)
Patient ID: Tracy Johnston, female   DOB: Jul 16, 1950, 62 y.o.   MRN: [id number]    SUBJECTIVE:  Tracy Johnston is a 62 y.o. Female with a h/o atrial fibrillation, HTN, and mitral regurgitation. She has a history of right nephrectomy for renal cell carcinoma in 2004 by Dr. Vernie Ammons. Additional significant comorbidities include tobacco abuse, ongoing, claudication, COPD and asthma, arthritis, status post bilateral total hip replacement. She is also being evaluated for the management of a right flank incisional hernia.  Her most recent stress test was in March 2013 which showed a small amount of apical to mid-anteroseptal ischemia and was deemed a low risk study. Her most recent echo was in September 2012 and revealed normal LV systolic function, EF 55-60%, moderate LVH, mild to moderate MR, and mild BAE.  She's had 2 episodes of chest pain in the last several months, neither of which bothered her. They lasted for up to 15 minutes and were relieved with rest, and were substernal in location. She denies palpitations, lightheadedness, and syncope, as well as shortness of breath.      Past Medical History   Diagnosis  Date   .  Tobacco abuse    .  Hypertension    .  COPD (chronic obstructive pulmonary disease)    .  Mitral regurgitation      Mild to moderate. Normal EF 09/2010   .  Atrial fibrillation      chronic. Was first diagnosed in her early 57s.   .  Insomnia    .  Cancer  2003ish     , cervix   .  Malignant neoplasm of kidney      rt removed 2003   .  Arthritis    .  Heart murmur     Past Surgical History   Procedure  Laterality  Date   .  Nephrectomy   2003   .  Arm fracture       rods, pins and bone graft.- to left arm from hip   .  Tubal ligation     .  Bone graft hip iliac crest       to left arm   .  Total hip arthroplasty   10/17/2011     Procedure: TOTAL HIP ARTHROPLASTY; Surgeon: Eldred Manges, MD; Location: [facility] OR; Service: Orthopedics; Laterality: Left; Left Total Hip  Arthroplasty   .  Cervical conization w/bx     .  Total hip arthroplasty   01/02/2012     Procedure: TOTAL HIP ARTHROPLASTY; Surgeon: Eldred Manges, MD; Location: [facility] OR; Service: Orthopedics; Laterality: Right; Right Total Hip Arthroplasty       Filed Vitals:   09/15/12 0848  Height: 5\' 3"  (1.6 m)  Weight: 180 lb (81.647 kg)     PHYSICAL EXAM General: NAD Neck: No JVD, no thyromegaly or thyroid nodule.  Lungs: Clear to auscultation bilaterally with normal respiratory effort. CV: Nondisplaced PMI.  Heart irregular but normal rate, S1/S2, no S3/S4, no murmur.  No peripheral edema.  No carotid bruit.  Normal pedal pulses.  Abdomen: Soft, nontender, no hepatosplenomegaly, no distention.  Neurologic: Alert and oriented x 3.  Psych: Normal affect. Extremities: No clubbing or cyanosis.     LABS: Basic Metabolic Panel: No results found for this basename: NA, K, CL, CO2, GLUCOSE, BUN, CREATININE, CALCIUM, MG, PHOS,  in the last 72 hours Liver Function Tests: No results found for this basename: AST, ALT, ALKPHOS, BILITOT, PROT, ALBUMIN,  in the last 72  hours No results found for this basename: LIPASE, AMYLASE,  in the last 72 hours CBC: No results found for this basename: WBC, NEUTROABS, HGB, HCT, MCV, PLT,  in the last 72 hours Cardiac Enzymes: No results found for this basename: CKTOTAL, CKMB, CKMBINDEX, TROPONINI,  in the last 72 hours BNP: No components found with this basename: POCBNP,  D-Dimer: No results found for this basename: DDIMER,  in the last 72 hours Hemoglobin A1C: No results found for this basename: HGBA1C,  in the last 72 hours Fasting Lipid Panel: No results found for this basename: CHOL, HDL, LDLCALC, TRIG, CHOLHDL, LDLDIRECT,  in the last 72 hours Thyroid Function Tests: No results found for this basename: TSH, T4TOTAL, FREET3, T3FREE, THYROIDAB,  in the last 72 hours Anemia Panel: No results found for this basename: VITAMINB12, FOLATE, FERRITIN, TIBC, IRON,  RETICCTPCT,  in the last 72 hours  RADIOLOGY: Ct Abdomen Pelvis Wo Contrast  09/13/2012   *RADIOLOGY REPORT*  Clinical Data: right flank hernia, history of nephrectomy  CT ABDOMEN AND PELVIS WITHOUT CONTRAST  Technique:  Multidetector CT imaging of the abdomen and pelvis was performed following the standard protocol without intravenous contrast.  Comparison: 05/15/2010 abdominal MRI and a CT scan 11/29/2010  Findings: Sagittal images of the spine shows degenerative changes with disc space flattening mild anterior and mild posterior spurring, vacuum disc phenomenon at L4-L5 level.  Tiny hiatal hernia.  Atherosclerotic calcifications of the abdominal aorta and the iliac arteries are noted.  Unenhanced liver, pancreas, spleen and adrenals are unremarkable. Again noted status post right nephrectomy.  Unenhanced left kidney is unremarkable.  No left nephrolithiasis.  No hydronephrosis or hydroureter.  Again noted laxity of the right abdominal wall.  There is a right lateral ventral hernia containing right colon, omental fat, hepatic flexure of the colon small bowel loops, terminal ileum, cecum and normal appendix. This is best visualized in coronal image 47 measures 12 cm cranial caudally by 6.5 cm transverse diameter.  There is no protrusion of the liver in the hernia.  Liver contour is normal.  No calcified gallstones are noted within gallbladder.  Scattered diverticula are noted descending colon.  Multiple sigmoid colon diverticula are noted.  No evidence of acute diverticulitis.  Oral contrast material was given to the patient.  Normal appearance of the terminal ileum.  Atherosclerotic calcifications of the abdominal aorta and the iliac arteries.  No aortic aneurysm.  Evaluation of the pelvis is limited by metallic artifacts from bilateral hip prosthesis.  Bilateral degenerative changes SI joints.  There is a calcified fibroid within uterus measures about 2 cm.  The uterus is atrophic.  No adnexal mass.   Visualized urinary bladder is unremarkable.  No inguinal adenopathy.  No sacral fracture is identified.  IMPRESSION:  1.  Again noted laxity of the right abdominal wall with a large right flank ventral hernia containing a right colon, cecum, terminal ileum, omental fat and hepatic flexure of the colon. The hernia measures 12 cm x 6.5 cm.  There is no evidence of acute complication. 2.  Again noted status post right nephrectomy. 3.  No left nephrolithiasis.  No left hydronephrosis. 4.  Scattered diverticula are noted left colon.  Multiple sigmoid colon diverticula.  There is no evidence of acute diverticulitis. 5.  Calcified fibroid within uterus measures 2 cm. 6.  Limited assessment of the pelvis due to metallic artifacts from bilateral hip prosthesis.   Original Report Authenticated By: Natasha Mead, M.D.      ASSESSMENT AND  PLAN: 1. Atrial fibrillation: rate-controlled on Diltiazem, on Xarelto for anticoagulation. 2. HTN: controlled on current medical therapy. 3. Mitral regurgitation: no appreciable murmur, stable. 4. Chest pain: appears to be stable and very infrequent. I do not recommend any further testing at this time.   Prentice Docker, M.D., F.A.C.C.

## 2012-10-07 ENCOUNTER — Encounter (INDEPENDENT_AMBULATORY_CARE_PROVIDER_SITE_OTHER): Payer: Self-pay | Admitting: General Surgery

## 2012-10-07 ENCOUNTER — Ambulatory Visit (INDEPENDENT_AMBULATORY_CARE_PROVIDER_SITE_OTHER): Payer: Medicare Other | Admitting: General Surgery

## 2012-10-07 VITALS — BP 152/90 | HR 68 | Temp 98.3°F | Resp 14 | Ht 63.0 in | Wt 177.8 lb

## 2012-10-07 DIAGNOSIS — K432 Incisional hernia without obstruction or gangrene: Secondary | ICD-10-CM

## 2012-10-07 NOTE — Progress Notes (Signed)
Patient ID: Tracy Johnston, female   DOB: 03-Apr-1950, 62 y.o.   MRN: [id number] History: This patient returns for followup evaluation of Tracy right flank incisional hernia. CT scan was performed and I have reviewed this extensively. There is laxity of the right abdominal wall which was noted 2 years ago on CT scan. There is a ventral hernia containing right colon, omentum, small bowel loops and appendix. The muscle separation is fairly significant. There is no sign of cancer. There is no sign of obstruction.  There is a fibroid within the uterus.   The right kidney is surgically absent. Long discussion with the patient and Tracy Johnston. She understands that the natural history of the hernia is to progress over time. It is so large I doubt that she would incarcerate or strangulate, but that is a small possibility. Current concern is that the hernia has enlarged . She only has mild pain. Comorbidities include atrial fibrillation on Xaralto and  Cardizem(followed by Tracy Johnston cards in [place]), ongoing tobacco abuse, hypertension, claudication, COPD and asthma, arthritis, status post bilateral total hip replacement and bilateral tubal ligation. She is in no distress today  ROS:  10 system review of systems is negative except as described above.  Exam: Alert. Oriented. Little status normal. No distress. Lungs: Clear auscultation. No wheezing. Heart: Regular rate and rhythm. No ectopy. Abdomen: Soft. Nontender. Nondistended. Right subcostal transverse incision extending around the flank. His anterolateral bulge. This is reducible. In left lateral decubitus position a think that I can feel the lower edge of the muscle.  Assessment:  right flank incisional hernia, minimally symptomatic at this time. She is at risk for progression in size, pain, and possible incarceration long-term.  Status post right nephrectomy for cancer, 2004.  Significant cardiac and pulmonary comorbidities, including COPD, atrial  fibrillation on antiplatelet therapy, asthma, hypertension  Status post bilateral total hip replacement  Tobacco abuse, ongoing  Plan: I advised the patient and family that this hernia would likely progress over time, and I offered to repair the hernia. I told him that the muscle separation was too great to bridge this with a laparoscopic mesh approach, and that the hernia would need to be repaired in an open fashion by reopening the right flank incision. They were told that this would require several day hospitalization. She does not want to have the hernia repaired at this time, but will think about doing this in March. I told Tracy that it was absolutely imperative that she stop smoking to lower the risk of infection and recurrence. I've asked Tracy to see Tracy primary care physician for smoking cessation program She was strongly advised to lose 15 pounds between now and time of surgery She will return to see me in January for further conversation If she decides to go ahead with surgery we will ultimately ask for cardiac clearance Dr.Suresh Purvis Sheffield, Tracy cardiologist in [place].    Angelia Mould. Derrell Lolling, M.D., [facility] General and Minimally invasive Surgery Breast and Colorectal Surgery Office:   [phone] Pager:   [phone]

## 2012-10-07 NOTE — Patient Instructions (Signed)
You have a large incisional hernia in your right flank. The muscles are separated and there is small intestine, large intestine, and part of the liver it in this hernia.There is no evidence of cancer.  This hernia will slowly enlarging over time. It will not get better until you have an operation.  The hernia can be repaired but this would require an open incision going through the old scar and putting a large piece of mesh in the wound. You would need to be hospitalized for several days.  It is essential that you stop smoking immediately  You should try to lose another 10 or 15 pounds.  Ask your primary care physician to assist you with smoking cessation and weight reduction.  You have stated that you do not want to have the surgery at this time, but perhaps in March of next year.  Return to see Dr. Derrell Lolling in January for further discussion.  Once we decided to do the surgery you will be referred to your cardiologist for cardiac clearance and discussion regarding your blood thinner medication.

## 2012-12-07 ENCOUNTER — Encounter: Payer: Self-pay | Admitting: Cardiovascular Disease

## 2013-02-24 ENCOUNTER — Encounter (INDEPENDENT_AMBULATORY_CARE_PROVIDER_SITE_OTHER): Payer: Self-pay

## 2013-02-24 ENCOUNTER — Encounter (INDEPENDENT_AMBULATORY_CARE_PROVIDER_SITE_OTHER): Payer: Self-pay | Admitting: General Surgery

## 2013-02-24 ENCOUNTER — Ambulatory Visit (INDEPENDENT_AMBULATORY_CARE_PROVIDER_SITE_OTHER): Payer: Medicare Other | Admitting: General Surgery

## 2013-02-24 VITALS — BP 140/90 | HR 72 | Temp 97.9°F | Resp 14 | Ht 63.0 in | Wt 181.0 lb

## 2013-02-24 DIAGNOSIS — K432 Incisional hernia without obstruction or gangrene: Secondary | ICD-10-CM

## 2013-02-24 NOTE — Progress Notes (Signed)
Patient ID: Tracy Johnston, female   DOB: 08/25/1950, 63 y.o.   MRN: [id number]  History:  This patient returns for followup evaluation of her right flank incisional hernia.  She states that the hernia bothers her but has not gotten any larger. She is tolerating a regular diet and having normal bowel function. She wants to consider having surgery in May. She states she continues to smoke. CT scan was previously performed and I have reviewed this again. . There is laxity of the right abdominal wall which was noted 2 years ago on CT scan. There is a ventral hernia containing right colon, omentum, small bowel loops and appendix. The muscle separation is fairly significant. There is no sign of cancer. There is no sign of obstruction. There is a fibroid within the uterus. The right kidney is surgically absent.  I had a Long discussion with the patient and her daughter. She understands that the natural history of the hernia is to progress over time. It is so large I doubt that she would incarcerate or strangulate, but that is a small possibility. Current concern is that the hernia has enlarged . She only has mild pain.  Comorbidities include atrial fibrillation on Xaralto and Cardizem(followed by [employer] cards in [place]), ongoing tobacco abuse, hypertension, claudication, COPD and asthma, arthritis, status post bilateral total hip replacement and bilateral tubal ligation.  She is in no distress today   ROS:  10 system review of systems is negative except as described above.   Exam:  Alert. Oriented. Little status normal. No distress.  Lungs: Clear auscultation. No wheezing.  Heart: Regular rate and rhythm. No ectopy.  Abdomen: Soft. Nontender. Nondistended. Right subcostal transverse incision extending around the flank. His anterolateral bulge. This is reducible. In left lateral decubitus position a think that I can feel the lower edge of the muscle, And it is completely reduced.    Assessment:  right flank  incisional hernia, minimally symptomatic at this time. She is at risk for progression in size, pain, and possible incarceration long-term.  Status post right nephrectomy for cancer, 2004.  Significant cardiac and pulmonary comorbidities, including COPD, atrial fibrillation on antiplatelet therapy, asthma, hypertension  Status post bilateral total hip replacement  Tobacco abuse, ongoing   Plan:  I advised the patient and family that this hernia would likely progress over time..  I told him that the muscle separation was too great to bridge this with a laparoscopic mesh approach, and that the hernia would need to be repaired in an open fashion by reopening the right flank incision. They were told that this would require several day hospitalization.   I told her and her sister that if she wanted me to consider repairing this hernia, that 1)   she would have to stop smoking for more than 6 weeks and that this would have to be documented by her primary care physician with normal urine nicotine test. 2)   I told her that she would need formal cardiac evaluation and cardiac risk assessment by her cardiologist and eating and I would need to review that report 3)   I told her that she would be to get a colonoscopy, which has never been done, to make sure she does not have any colorectal neoplasia. After all 3 of these things were accomplished, we will make an appointment for her to return to see me. I told her that I would not operate on her unless she stopped smoking for more than 6 weeks and  that it was documented. She agrees to this.    She was strongly advised to lose 15 pounds between now and time of surgery     Anvi Mangal M. Dalbert Batman, M.D., [facility]  General and Minimally invasive Surgery  Breast and Colorectal Surgery  Office: [phone]  Pager: [phone]

## 2013-02-24 NOTE — Patient Instructions (Signed)
Your incisional hernia in the right flank feels to be about the same size as it was before. Technically, we can repair this with an open operation with mesh.  1)   If you would like to have this hernia repaired by me, it is absolutely imperative that he stop smoking for more than 6 weeks and that we document that he stop smoking by urine nicotine test please see your primary care physician to supervise a smoking cessation program. Once I have received written communication from your PCP that this has been accomplished, we will consider scheduling you for this surgery.  2)   See your cardiologist in [place] for a formal risk assessment for general anesthesia and laparotomy.  I will need a copy of the written report  3)   You have never had a colonoscopy, and you should have that done. I will need to have the typewritten report from that test.   After all 3 of these settings are accomplished, we will make an appointment for you to return to see me to discuss surgery.

## 2013-03-17 ENCOUNTER — Encounter: Payer: Medicare Other | Admitting: Cardiovascular Disease

## 2013-04-11 ENCOUNTER — Encounter: Payer: Self-pay | Admitting: Cardiovascular Disease

## 2013-04-11 ENCOUNTER — Encounter: Payer: Self-pay | Admitting: *Deleted

## 2013-04-11 ENCOUNTER — Ambulatory Visit (INDEPENDENT_AMBULATORY_CARE_PROVIDER_SITE_OTHER): Payer: Medicare Other | Admitting: Cardiovascular Disease

## 2013-04-11 VITALS — BP 130/90 | HR 76 | Ht 63.0 in | Wt 187.0 lb

## 2013-04-11 DIAGNOSIS — Z720 Tobacco use: Secondary | ICD-10-CM

## 2013-04-11 DIAGNOSIS — R9439 Abnormal result of other cardiovascular function study: Secondary | ICD-10-CM

## 2013-04-11 DIAGNOSIS — F17200 Nicotine dependence, unspecified, uncomplicated: Secondary | ICD-10-CM

## 2013-04-11 DIAGNOSIS — I059 Rheumatic mitral valve disease, unspecified: Secondary | ICD-10-CM

## 2013-04-11 DIAGNOSIS — I340 Nonrheumatic mitral (valve) insufficiency: Secondary | ICD-10-CM

## 2013-04-11 DIAGNOSIS — I10 Essential (primary) hypertension: Secondary | ICD-10-CM

## 2013-04-11 DIAGNOSIS — Z01818 Encounter for other preprocedural examination: Secondary | ICD-10-CM

## 2013-04-11 DIAGNOSIS — I4891 Unspecified atrial fibrillation: Secondary | ICD-10-CM

## 2013-04-11 MED ORDER — VARENICLINE TARTRATE 0.5 MG X 11 & 1 MG X 42 PO MISC: ORAL | Status: DC

## 2013-04-11 NOTE — Progress Notes (Signed)
Patient ID: Tracy Johnston, female   DOB: [birth year], 63 y.o.   MRN: [id number]      SUBJECTIVE: The patient is a 63 yr old female with a h/o atrial fibrillation, HTN, and mitral regurgitation. She has a history of right nephrectomy for renal cell carcinoma in 2004. Additional significant comorbidities include tobacco abuse, claudication, COPD and asthma, arthritis, status post bilateral total hip replacement. She is also being evaluated for the management of a right flank incisional hernia for which she may undergo surgery in the near future.  Her most recent stress test was in March 2013 which showed a small amount of apical to mid-anteroseptal ischemia and was deemed a low risk study.  Her most recent echo was in September 2012 and revealed normal LV systolic function, EF 95-62%, moderate LVH, mild to moderate MR, and mild BAE.  She currently denies chest pain, shortness of breath, palpitations and leg swelling. She very seldom experiences dizziness. She has not smoked for the past 3 days but has had a lot of difficulty quitting. She is interested in taking a medication to help with this. She walks on a treadmill without any incline for 10-15 minutes at a time, 3 times a week. She does no house work with respect to cleaning her floors or vacuuming. She also does not do any yard work anymore. She denies bleeding problems.      Allergies  Allergen Reactions  . Aspirin Other (See Comments)    Bleeding at higher doses  . Latex Rash    When she wears gloves    Current Outpatient Prescriptions  Medication Sig Dispense Refill  . albuterol (ACCUNEB) 0.63 MG/3ML nebulizer solution Take 1 ampule by nebulization every 6 (six) hours as needed. For shortness of breath      . allopurinol (ZYLOPRIM) 300 MG tablet Take 300 mg by mouth daily.      Marland Kitchen diltiazem (CARDIZEM CD) 240 MG 24 hr capsule Take 1 capsule (240 mg total) by mouth daily.  30 capsule  6  . docusate sodium (COLACE) 100 MG capsule Take 100  mg by mouth 2 (two) times daily.       . Fluticasone-Salmeterol (ADVAIR) 250-50 MCG/DOSE AEPB Inhale 2 puffs into the lungs every 12 (twelve) hours.       Marland Kitchen olmesartan-hydrochlorothiazide (BENICAR HCT) 40-12.5 MG per tablet Take 1 tablet by mouth daily.        Marland Kitchen oxybutynin (DITROPAN) 5 MG tablet Take 5 mg by mouth 2 (two) times daily.       Marland Kitchen oxymorphone (OPANA) 10 MG tablet Take 10 mg by mouth every 8 (eight) hours as needed.       . Rivaroxaban (XARELTO) 20 MG TABS tablet Take 1 tablet (20 mg total) by mouth daily.  30 tablet  11  . traZODone (DESYREL) 50 MG tablet Take 50 mg by mouth at bedtime as needed.        No current facility-administered medications for this visit.    Past Medical History  Diagnosis Date  . Tobacco abuse   . Hypertension   . COPD (chronic obstructive pulmonary disease)   . Mitral regurgitation     Mild to moderate. Normal EF 09/2010  . Atrial fibrillation     chronic. Was first diagnosed in her early 50s.  . Insomnia   . Cancer 2003ish    , cervix  . Malignant neoplasm of kidney     rt removed 2003  . Arthritis   . Heart murmur  Past Surgical History  Procedure Laterality Date  . Nephrectomy  2003  . Arm fracture      rods, pins and bone graft.- to left arm from hip  . Tubal ligation    . Bone graft hip iliac crest      to left arm  . Total hip arthroplasty  10/17/2011    Procedure: TOTAL HIP ARTHROPLASTY;  Surgeon: [name], MD;  Location: [facility];  Service: Orthopedics;  Laterality: Left;  Left Total Hip Arthroplasty  . Cervical conization w/bx    . Total hip arthroplasty  01/02/2012    Procedure: TOTAL HIP ARTHROPLASTY;  Surgeon: [name], MD;  Location: [facility];  Service: Orthopedics;  Laterality: Right;  Right Total Hip Arthroplasty    History   Social History  . Marital Status: Single    Spouse Name: N/A    Number of Children: N/A  . Years of Education: N/A   Occupational History  . Not on file.   Social History Main Topics    . Smoking status: Former Smoker -- 1.00 packs/day for 40 years    Types: Cigarettes    Quit date: 10/14/2010  . Smokeless tobacco: Never Used     Comment: just quit since getting out of hospital   . Alcohol Use: Yes     Comment: occasions only 1 glass of wine  . Drug Use: No  . Sexual Activity: Not on file   Other Topics Concern  . Not on file   Social History Narrative  . No narrative on file     Filed Vitals:   04/11/13 1203  BP: 130/90  Pulse: 76  Height: 5\' 3"  (1.6 m)  Weight: 187 lb (84.823 kg)  SpO2: 97%    PHYSICAL EXAM General: NAD  Neck: No JVD, no thyromegaly or thyroid nodule.  Lungs: Clear to auscultation bilaterally with normal respiratory effort.  CV: Nondisplaced PMI. Heart irregular but normal rate, S1/S2, no S3/S4, no murmur. No peripheral edema. No carotid bruit. Normal pedal pulses.  Abdomen: Soft, nontender, no hepatosplenomegaly, no distention.  Neurologic: Alert and oriented x 3.  Psych: Normal affect.  Extremities: No clubbing or cyanosis.    ECG: reviewed and available in electronic records.      ASSESSMENT AND PLAN: 1. Atrial fibrillation: rate-controlled on diltiazem and on Xarelto for anticoagulation.  2. HTN: relatively well controlled on current medical therapy.  3. Mitral regurgitation: no appreciable murmur, stable. 4. Preoperative risk stratification: given her numerous risk factors and prior abnormal findings, will proceed with a Lexiscan Cardiolite to more accurately assess perioperative risk. In any event, as per the most recent 2014 guidelines, her risk category is "elevated", thus portending >/= 1% risk for a major adverse cardiac event in the perioperative period. 5. Tobacco abuse: will prescribe a Chantix starter pack.  Dispo: f/u 6 months.  Kate Sable, M.D., F.A.C.C.

## 2013-04-11 NOTE — Patient Instructions (Signed)
Your physician has requested that you have a lexiscan myoview. For further information please visit [email or web address]. Please follow instruction sheet, as given. Office will contact with results via phone or letter.   Chantix starter pack - new sent to pharm  Continue all other medications.   Your physician wants you to follow up in: 6 months.  You will receive a reminder letter in the mail one-two months in advance.  If you don't receive a letter, please call our office to schedule the follow up appointment

## 2013-05-09 ENCOUNTER — Encounter (HOSPITAL_COMMUNITY)
Admission: RE | Admit: 2013-05-09 | Discharge: 2013-05-09 | Disposition: A | Discharge status: Home / Self Care | Payer: Medicare Other | Source: Ambulatory Visit | Attending: Cardiovascular Disease | Admitting: Cardiovascular Disease

## 2013-05-09 ENCOUNTER — Telehealth: Payer: Self-pay | Admitting: *Deleted

## 2013-05-09 ENCOUNTER — Encounter (HOSPITAL_COMMUNITY): Payer: Self-pay

## 2013-05-09 DIAGNOSIS — R9439 Abnormal result of other cardiovascular function study: Secondary | ICD-10-CM

## 2013-05-09 DIAGNOSIS — Z01818 Encounter for other preprocedural examination: Secondary | ICD-10-CM | POA: Insufficient documentation

## 2013-05-09 DIAGNOSIS — I4891 Unspecified atrial fibrillation: Secondary | ICD-10-CM

## 2013-05-09 DIAGNOSIS — Z01810 Encounter for preprocedural cardiovascular examination: Secondary | ICD-10-CM

## 2013-05-09 HISTORY — DX: Disorder of kidney and ureter, unspecified: N28.9

## 2013-05-09 HISTORY — DX: Unspecified asthma, uncomplicated: J45.909

## 2013-05-09 HISTORY — DX: Sickle-cell disease without crisis: D57.1

## 2013-05-09 MED ORDER — SODIUM CHLORIDE 0.9 % IJ SOLN
INTRAMUSCULAR | Status: AC
  Administered 2013-05-09: 10 mL via INTRAVENOUS
  Filled 2013-05-09: qty 10

## 2013-05-09 MED ORDER — REGADENOSON 0.4 MG/5ML IV SOLN
INTRAVENOUS | Status: AC
  Administered 2013-05-09: 0.4 mg via INTRAVENOUS
  Filled 2013-05-09: qty 5

## 2013-05-09 MED ORDER — TECHNETIUM TC 99M SESTAMIBI - CARDIOLITE
10.0000 | Freq: Once | INTRAVENOUS | Status: AC | PRN
  Administered 2013-05-09: 08:00:00 10 via INTRAVENOUS

## 2013-05-09 MED ORDER — TECHNETIUM TC 99M SESTAMIBI GENERIC - CARDIOLITE
30.0000 | Freq: Once | INTRAVENOUS | Status: AC | PRN
  Administered 2013-05-09: 30 via INTRAVENOUS

## 2013-05-09 NOTE — Progress Notes (Signed)
Stress Lab Nurses Notes - [facility]  Dezzie Badilla 05/09/2013 Reason for doing test: AFib & Pre op clearance Type of test: Wille Glaser Nurse performing test: Gerrit Halls, RN Nuclear Medicine Tech: Redmond Baseman Echo Tech: Not Applicable MD performing test: Myles Gip & Jory Sims NP Family MD: Scotty Court Test explained and consent signed: yes IV started: 22g jelco, Saline lock flushed, No redness or edema and Saline lock started in radiology Symptoms: SOB & Nause Treatment/Intervention: None Reason test stopped: protocol completed After recovery IV was: Discontinued via X-ray tech and No redness or edema Patient to return to [facility]. Med at : 10:45 Patient discharged: Home Patient's Condition upon discharge was: stable Comments: During test BP  167/89 & HR 112.  Recovery BP 129/98 & HR 75.  Symptoms resolved in recovery. Donnajean Lopes

## 2013-05-09 NOTE — Telephone Encounter (Signed)
Message copied by Laurine Blazer on Mon May 09, 2013  4:17 PM ------      Message from: Kate Sable A      Created: Mon May 09, 2013  3:33 PM       Low-risk stress test, similar to prior stress test. ------

## 2013-05-09 NOTE — Telephone Encounter (Signed)
Notes Recorded by Laurine Blazer, LPN on 09/29/4095 at 3:53 PM Left message to return call.

## 2013-05-10 NOTE — Telephone Encounter (Signed)
Notes Recorded by Laurine Blazer, LPN on 0/62/3762 at 83:15 AM Patient notified and verbalized understanding. Will forward results to Dr. Fanny Skates at patient request.

## 2013-05-10 NOTE — Telephone Encounter (Signed)
Tracy Johnston returned your call. Please call before 12 noon if possible.

## 2013-10-06 ENCOUNTER — Encounter: Payer: Self-pay | Admitting: Cardiovascular Disease

## 2013-10-20 ENCOUNTER — Ambulatory Visit (INDEPENDENT_AMBULATORY_CARE_PROVIDER_SITE_OTHER): Payer: PRIVATE HEALTH INSURANCE | Admitting: Cardiovascular Disease

## 2013-10-20 ENCOUNTER — Encounter: Payer: Self-pay | Admitting: Cardiovascular Disease

## 2013-10-20 VITALS — BP 124/86 | HR 83 | Ht 63.0 in | Wt 187.0 lb

## 2013-10-20 DIAGNOSIS — I10 Essential (primary) hypertension: Secondary | ICD-10-CM | POA: Diagnosis not present

## 2013-10-20 DIAGNOSIS — F17200 Nicotine dependence, unspecified, uncomplicated: Secondary | ICD-10-CM

## 2013-10-20 DIAGNOSIS — I059 Rheumatic mitral valve disease, unspecified: Secondary | ICD-10-CM

## 2013-10-20 DIAGNOSIS — R9439 Abnormal result of other cardiovascular function study: Secondary | ICD-10-CM

## 2013-10-20 DIAGNOSIS — I340 Nonrheumatic mitral (valve) insufficiency: Secondary | ICD-10-CM

## 2013-10-20 DIAGNOSIS — I4891 Unspecified atrial fibrillation: Secondary | ICD-10-CM | POA: Diagnosis not present

## 2013-10-20 DIAGNOSIS — Z7189 Other specified counseling: Secondary | ICD-10-CM

## 2013-10-20 DIAGNOSIS — Z716 Tobacco abuse counseling: Secondary | ICD-10-CM

## 2013-10-20 MED ORDER — VARENICLINE TARTRATE 0.5 MG X 11 & 1 MG X 42 PO MISC: ORAL | Status: DC

## 2013-10-20 NOTE — Patient Instructions (Signed)
   Chantix starter pack Continue all other medications.   Your physician wants you to follow up in:  1 year.  You will receive a reminder letter in the mail one-two months in advance.  If you don't receive a letter, please call our office to schedule the follow up appointment

## 2013-10-20 NOTE — Addendum Note (Signed)
Addended by: Laurine Blazer on: 10/20/2013 09:05 AM   Modules accepted: Orders

## 2013-10-20 NOTE — Progress Notes (Signed)
Patient ID: Tracy Johnston, female   DOB: 09-26-1950, 63 y.o.   MRN: [id number]      SUBJECTIVE: The patient is a 63 yr old female with a history of atrial fibrillation, HTN, and mitral regurgitation. She has a history of right nephrectomy for renal cell carcinoma in 2004. Additional significant comorbidities include tobacco abuse, claudication, COPD and asthma, arthritis, status post bilateral total hip replacement.   Her most recent stress test was in April 2015. Perfusion imaging showed a small anteroseptal, partially reversible defect of moderate intensity. Although ischemia could not be excluded in the distribution of the mid to distal LAD, variable soft tissue attenuation may have also been responsible. Regional wall motion was normal. Her most recent echo was in September 2012 and revealed normal LV systolic function, EF 31-49%, moderate LVH, mild to moderate MR, and mild BAE. She is doing well. She very seldom has fleeting chest pains. She is smoking again as she felt Chantix caused her to sleepwalk. She has chronic dyspnea from COPD which is no worse. Denies leg swelling and bleeding problems.    Review of Systems: As per "subjective", otherwise negative.  Allergies  Allergen Reactions  . Aspirin Other (See Comments)    Bleeding at higher doses  . Latex Rash    When she wears gloves    Current Outpatient Prescriptions  Medication Sig Dispense Refill  . albuterol (ACCUNEB) 0.63 MG/3ML nebulizer solution Take 1 ampule by nebulization every 6 (six) hours as needed. For shortness of breath      . allopurinol (ZYLOPRIM) 300 MG tablet Take 300 mg by mouth daily.      Marland Kitchen diltiazem (CARDIZEM CD) 240 MG 24 hr capsule Take 1 capsule (240 mg total) by mouth daily.  30 capsule  6  . docusate sodium (COLACE) 100 MG capsule Take 100 mg by mouth 2 (two) times daily.       . Fluticasone-Salmeterol (ADVAIR) 250-50 MCG/DOSE AEPB Inhale 2 puffs into the lungs every 12 (twelve) hours.       Marland Kitchen  olmesartan-hydrochlorothiazide (BENICAR HCT) 40-12.5 MG per tablet Take 1 tablet by mouth daily.        Marland Kitchen oxymorphone (OPANA) 10 MG tablet Take 10 mg by mouth every 8 (eight) hours as needed.       . Rivaroxaban (XARELTO) 20 MG TABS tablet Take 1 tablet (20 mg total) by mouth daily.  30 tablet  11  . UNKNOWN TO PATIENT For urinary incontinence       No current facility-administered medications for this visit.    Past Medical History  Diagnosis Date  . Tobacco abuse   . Hypertension   . COPD (chronic obstructive pulmonary disease)   . Mitral regurgitation     Mild to moderate. Normal EF 09/2010  . Atrial fibrillation     chronic. Was first diagnosed in her early 3s.  . Insomnia   . Cancer 2003ish    , cervix  . Malignant neoplasm of kidney     rt removed 2003  . Arthritis   . Heart murmur   . Renal insufficiency   . Sickle cell anemia   . Asthma     Past Surgical History  Procedure Laterality Date  . Nephrectomy  2003  . Arm fracture      rods, pins and bone graft.- to left arm from hip  . Tubal ligation    . Bone graft hip iliac crest      to left arm  . Total  hip arthroplasty  10/17/2011    Procedure: TOTAL HIP ARTHROPLASTY;  Surgeon: [name], MD;  Location: [facility];  Service: Orthopedics;  Laterality: Left;  Left Total Hip Arthroplasty  . Cervical conization w/bx    . Total hip arthroplasty  01/02/2012    Procedure: TOTAL HIP ARTHROPLASTY;  Surgeon: [name], MD;  Location: [facility];  Service: Orthopedics;  Laterality: Right;  Right Total Hip Arthroplasty    History   Social History  . Marital Status: Single    Spouse Name: N/A    Number of Children: N/A  . Years of Education: N/A   Occupational History  . Not on file.   Social History Main Topics  . Smoking status: Current Some Day Smoker -- 1.00 packs/day for 40 years    Types: Cigarettes    Start date: 10/21/1983  . Smokeless tobacco: Never Used     Comment: just quit since getting out of hospital  9/24 restarted smoking 9 months ago-AJ  . Alcohol Use: Yes     Comment: occasions only 1 glass of wine  . Drug Use: No  . Sexual Activity: Not on file   Other Topics Concern  . Not on file   Social History Narrative  . No narrative on file     Filed Vitals:   10/20/13 0843  BP: 124/86  Pulse: 83  Height: 5\' 3"  (1.6 m)  Weight: 187 lb (84.823 kg)  SpO2: 99%    PHYSICAL EXAM General: NAD HEENT: Normal. Neck: No JVD, no thyromegaly. Lungs: Faint expiratory rhonchi b/l, no crackles. Normal respiratory effort. CV: Nondisplaced PMI.  Irregular rhythm, normal S1/S2, no S3, no murmur. No pretibial or periankle edema.  No carotid bruit.  Normal pedal pulses.  Abdomen: Soft, nontender, no hepatosplenomegaly, no distention.  Neurologic: Alert and oriented x 3.  Psych: Normal affect. Skin: Normal. Musculoskeletal: Normal range of motion, no gross deformities. Extremities: No clubbing or cyanosis.   ECG: Most recent ECG reviewed.      ASSESSMENT AND PLAN: 1. Atrial fibrillation: Rate-controlled on diltiazem and on Xarelto for anticoagulation.  2. Essential HTN: Controlled on current medical therapy.  3. Mitral regurgitation: No appreciable murmur, stable.  4. Tobacco abuse: Cessation counseling provided. Will prescribe a Chantix starter pack. I informed her that her prior symptoms were likely due to nicotine withdrawal and not from the medication.  Dispo: f/u 1 year.  Kate Sable, M.D., F.A.C.C.

## 2013-10-24 ENCOUNTER — Other Ambulatory Visit: Payer: Self-pay | Admitting: Cardiovascular Disease

## 2014-10-20 ENCOUNTER — Encounter: Payer: Self-pay | Admitting: *Deleted

## 2014-10-24 ENCOUNTER — Encounter: Payer: Self-pay | Admitting: Cardiovascular Disease

## 2014-10-24 ENCOUNTER — Ambulatory Visit (INDEPENDENT_AMBULATORY_CARE_PROVIDER_SITE_OTHER): Payer: Medicare Other | Admitting: Cardiovascular Disease

## 2014-10-24 VITALS — BP 101/69 | HR 73 | Ht 63.0 in | Wt 192.0 lb

## 2014-10-24 DIAGNOSIS — I10 Essential (primary) hypertension: Secondary | ICD-10-CM | POA: Diagnosis not present

## 2014-10-24 DIAGNOSIS — I340 Nonrheumatic mitral (valve) insufficiency: Secondary | ICD-10-CM

## 2014-10-24 DIAGNOSIS — Z01818 Encounter for other preprocedural examination: Secondary | ICD-10-CM | POA: Diagnosis not present

## 2014-10-24 DIAGNOSIS — Z720 Tobacco use: Secondary | ICD-10-CM

## 2014-10-24 DIAGNOSIS — I482 Chronic atrial fibrillation, unspecified: Secondary | ICD-10-CM

## 2014-10-24 NOTE — Progress Notes (Signed)
Patient ID: Tracy Johnston, female   DOB: [birth year], 64 y.o.   MRN: [id number]      SUBJECTIVE: The patient returns for routine cardiovascular follow-up. She has a history of atrial fibrillation, HTN, and mitral regurgitation. She also has a history of right nephrectomy for renal cell carcinoma in 2004. Additional significant comorbidities include tobacco abuse, claudication, COPD and asthma, arthritis, status post bilateral total hip replacement.  Her most recent stress test was in April 2015. Perfusion imaging showed a small anteroseptal, partially reversible defect of moderate intensity. Although ischemia could not be excluded in the distribution of the mid to distal LAD, variable soft tissue attenuation may have also been responsible. Regional wall motion was normal. Her most recent echo was in September 2012 and revealed normal LV systolic function, EF 82-50%, moderate LVH, mild to moderate MR, and mild BAE.  She was hospitalized in April 2016 for acute COPD exacerbation and acute alcohol intoxication.  She very seldom has palpitations and denies bleeding problems. Also denies chest pain and leg swelling. Says she may have to undergo knee surgery.    Review of Systems: As per "subjective", otherwise negative.  Allergies  Allergen Reactions  . Aspirin Other (See Comments)    Bleeding at higher doses  . Latex Rash    When she wears gloves    Current Outpatient Prescriptions  Medication Sig Dispense Refill  . albuterol (ACCUNEB) 0.63 MG/3ML nebulizer solution Take 1 ampule by nebulization every 6 (six) hours as needed. For shortness of breath    . allopurinol (ZYLOPRIM) 300 MG tablet Take 300 mg by mouth daily.    Marland Kitchen diltiazem (CARDIZEM CD) 240 MG 24 hr capsule Take 1 capsule (240 mg total) by mouth daily. 30 capsule 6  . docusate sodium (COLACE) 100 MG capsule Take 100 mg by mouth 2 (two) times daily.     . Fluticasone-Salmeterol (ADVAIR) 250-50 MCG/DOSE AEPB Inhale 1 puff into the  lungs every 12 (twelve) hours.     Marland Kitchen ibuprofen (ADVIL,MOTRIN) 800 MG tablet Take 1 tablet by mouth 2 (two) times daily.    Marland Kitchen MYRBETRIQ 50 MG TB24 tablet Take 1 tablet by mouth daily.    Marland Kitchen olmesartan-hydrochlorothiazide (BENICAR HCT) 40-12.5 MG per tablet Take 1 tablet by mouth daily.      Marland Kitchen oxymorphone (OPANA) 10 MG tablet Take 10 mg by mouth every 8 (eight) hours as needed.     . pantoprazole (PROTONIX) 40 MG tablet Take 1 tablet by mouth daily.    Alveda Reasons 20 MG TABS tablet TAKE 1 TABLET BY MOUTH DAILY 30 tablet 11   No current facility-administered medications for this visit.    Past Medical History  Diagnosis Date  . Tobacco abuse   . Hypertension   . COPD (chronic obstructive pulmonary disease)   . Mitral regurgitation     Mild to moderate. Normal EF 09/2010  . Atrial fibrillation     chronic. Was first diagnosed in her early 43s.  . Insomnia   . Cancer 2003ish    , cervix  . Malignant neoplasm of kidney     rt removed 2003  . Arthritis   . Heart murmur   . Renal insufficiency   . Sickle cell anemia   . Asthma     Past Surgical History  Procedure Laterality Date  . Nephrectomy  2003  . Arm fracture      rods, pins and bone graft.- to left arm from hip  . Tubal ligation    .  Bone graft hip iliac crest      to left arm  . Total hip arthroplasty  10/17/2011    Procedure: TOTAL HIP ARTHROPLASTY;  Surgeon: [name], MD;  Location: [facility];  Service: Orthopedics;  Laterality: Left;  Left Total Hip Arthroplasty  . Cervical conization w/bx    . Total hip arthroplasty  01/02/2012    Procedure: TOTAL HIP ARTHROPLASTY;  Surgeon: [name], MD;  Location: [facility];  Service: Orthopedics;  Laterality: Right;  Right Total Hip Arthroplasty    Social History   Social History  . Marital Status: Single    Spouse Name: N/A  . Number of Children: N/A  . Years of Education: N/A   Occupational History  . Not on file.   Social History Main Topics  . Smoking status: Former  Smoker -- 1.00 packs/day for 40 years    Types: Cigarettes    Start date: 10/21/1983    Quit date: 02/27/2014  . Smokeless tobacco: Never Used     Comment: just quit since getting out of hospital 9/24 restarted smoking 9 months ago-AJ  . Alcohol Use: 0.0 oz/week    0 Standard drinks or equivalent per week     Comment: occasions only 1 glass of wine  . Drug Use: No  . Sexual Activity: Not on file   Other Topics Concern  . Not on file   Social History Narrative     Filed Vitals:   10/24/14 0959  BP: 101/69  Pulse: 73  Height: 5\' 3"  (1.6 m)  Weight: 192 lb (87.091 kg)  SpO2: 98%    PHYSICAL EXAM General: NAD HEENT: Normal. Neck: No JVD, no thyromegaly. Lungs: Faint expiratory rhonchi b/l, no crackles. Normal respiratory effort. CV: Nondisplaced PMI. Irregular rhythm, normal S1/S2, no S3, no murmur. No pretibial or periankle edema. No carotid bruit. Normal pedal pulses.  Abdomen: Soft, nontender, no hepatosplenomegaly, no distention.  Neurologic: Alert and oriented x 3.  Psych: Normal affect. Skin: Normal. Musculoskeletal: Normal range of motion, no gross deformities. Extremities: No clubbing or cyanosis.   ECG: Most recent ECG reviewed.      ASSESSMENT AND PLAN: 1. Atrial fibrillation: Symptomatically stable. Rate-controlled on diltiazem and on Xarelto for anticoagulation. No changes.  2. Essential HTN: Controlled on current medical therapy. No changes.  3. Mitral regurgitation: No appreciable murmur, stable.   4. Tobacco abuse: No longer on Chantix.  5. Preoperative risk stratification: Would not need noninvasive testing prior to knee surgery and can proceed with acceptable risk.  Dispo: f/u 1 year.  Kate Sable, M.D., F.A.C.C.

## 2014-10-24 NOTE — Patient Instructions (Signed)
Continue all current medications. Your physician wants you to follow up in:  1 year.  You will receive a reminder letter in the mail one-two months in advance.  If you don't receive a letter, please call our office to schedule the follow up appointment   

## 2014-11-24 ENCOUNTER — Other Ambulatory Visit: Payer: Self-pay | Admitting: Cardiovascular Disease

## 2015-10-12 ENCOUNTER — Other Ambulatory Visit: Payer: Self-pay | Admitting: Cardiovascular Disease

## 2015-10-29 ENCOUNTER — Encounter: Payer: Self-pay | Admitting: *Deleted

## 2015-10-30 ENCOUNTER — Ambulatory Visit (INDEPENDENT_AMBULATORY_CARE_PROVIDER_SITE_OTHER): Payer: Medicare Other | Admitting: Cardiovascular Disease

## 2015-10-30 ENCOUNTER — Encounter: Payer: Self-pay | Admitting: Cardiovascular Disease

## 2015-10-30 VITALS — BP 102/70 | HR 72 | Ht 64.0 in | Wt 188.0 lb

## 2015-10-30 DIAGNOSIS — I482 Chronic atrial fibrillation, unspecified: Secondary | ICD-10-CM

## 2015-10-30 DIAGNOSIS — I10 Essential (primary) hypertension: Secondary | ICD-10-CM | POA: Diagnosis not present

## 2015-10-30 NOTE — Patient Instructions (Signed)

## 2015-10-30 NOTE — Progress Notes (Signed)
SUBJECTIVE: The patient returns for routine cardiovascular follow-up. She has a history of atrial fibrillation, HTN, and mitral regurgitation. She also has a history of right nephrectomy for renal cell carcinoma in 2004.  Her most recent stress test was in April 2015. Perfusion imaging showed a small anteroseptal, partially reversible defect of moderate intensity. Although ischemia could not be excluded in the distribution of the mid to distal LAD, variable soft tissue attenuation may have also been responsible. Regional wall motion was normal. Her most recent echo was in September 2012 and revealed normal LV systolic function, EF 0000000, moderate LVH, mild to moderate MR, and mild BAE.  ECG today shows a fib, HR 78 bpm.  Denies chest pain and palpitations. Has chronic dyspnea from COPD which is stable. No bleeding problems or leg swelling.     Review of Systems: As per "subjective", otherwise negative.  Allergies  Allergen Reactions  . Aspirin Other (See Comments)    Bleeding at higher doses  . Latex Rash    When she wears gloves    Current Outpatient Prescriptions  Medication Sig Dispense Refill  . albuterol (ACCUNEB) 0.63 MG/3ML nebulizer solution Take 1 ampule by nebulization every 6 (six) hours as needed. For shortness of breath    . allopurinol (ZYLOPRIM) 300 MG tablet Take 300 mg by mouth daily.    Marland Kitchen diltiazem (CARDIZEM CD) 240 MG 24 hr capsule Take 1 capsule (240 mg total) by mouth daily. 30 capsule 6  . docusate sodium (COLACE) 100 MG capsule Take 100 mg by mouth 2 (two) times daily.     . Fluticasone-Salmeterol (ADVAIR) 250-50 MCG/DOSE AEPB Inhale 1 puff into the lungs every 12 (twelve) hours.     Marland Kitchen ibuprofen (ADVIL,MOTRIN) 800 MG tablet Take 1 tablet by mouth 2 (two) times daily.    Marland Kitchen MYRBETRIQ 50 MG TB24 tablet Take 1 tablet by mouth daily.    Marland Kitchen olmesartan-hydrochlorothiazide (BENICAR HCT) 40-12.5 MG per tablet Take 1 tablet by mouth daily.      Marland Kitchen oxymorphone  (OPANA) 10 MG tablet Take 10 mg by mouth every 8 (eight) hours as needed.     . pantoprazole (PROTONIX) 40 MG tablet Take 1 tablet by mouth daily.    Alveda Reasons 20 MG TABS tablet TAKE 1 TABLET BY MOUTH DAILY 30 tablet 6   No current facility-administered medications for this visit.     Past Medical History:  Diagnosis Date  . Arthritis   . Asthma   . Atrial fibrillation (HCC)    chronic. Was first diagnosed in her early 69s.  . Cancer ([facility]) 2003ish   , cervix  . COPD (chronic obstructive pulmonary disease) ([facility])   . Heart murmur   . Hypertension   . Insomnia   . Malignant neoplasm of kidney ([place] [facility])    rt removed 2003  . Mitral regurgitation    Mild to moderate. Normal EF 09/2010  . Renal insufficiency   . Sickle cell anemia (HCC)   . Tobacco abuse     Past Surgical History:  Procedure Laterality Date  . arm fracture     rods, pins and bone graft.- to left arm from hip  . BONE GRAFT HIP ILIAC CREST     to left arm  . CERVICAL CONIZATION W/BX    . NEPHRECTOMY  2003  . TOTAL HIP ARTHROPLASTY  10/17/2011   Procedure: TOTAL HIP ARTHROPLASTY;  Surgeon: [name], MD;  Location: [facility];  Service: Orthopedics;  Laterality:  Left;  Left Total Hip Arthroplasty  . TOTAL HIP ARTHROPLASTY  01/02/2012   Procedure: TOTAL HIP ARTHROPLASTY;  Surgeon: [name], MD;  Location: [facility];  Service: Orthopedics;  Laterality: Right;  Right Total Hip Arthroplasty  . TUBAL LIGATION      Social History   Social History  . Marital status: Single    Spouse name: N/A  . Number of children: N/A  . Years of education: N/A   Occupational History  . Not on file.   Social History Main Topics  . Smoking status: Former Smoker    Packs/day: 1.00    Years: 40.00    Types: Cigarettes    Start date: 10/21/1983    Quit date: 02/27/2014  . Smokeless tobacco: Never Used     Comment: just quit since getting out of hospital 9/24 restarted smoking 9 months ago-AJ  . Alcohol use 0.0 oz/week     Comment:  occasions only 1 glass of wine  . Drug use: No  . Sexual activity: Not on file   Other Topics Concern  . Not on file   Social History Narrative  . No narrative on file     Vitals:   10/30/15 0834  Weight: 188 lb (85.3 kg)  Height: 5\' 4"  (1.626 m)    PHYSICAL EXAM General: NAD HEENT: Normal. Neck: No JVD, no thyromegaly. Lungs: Faint end-expiratory wheezes, no rales. CV: Nondisplaced PMI.  Irregular rhythm, normal S1/S2, no S3, no murmur. No pretibial or periankle edema.     Abdomen: Soft, nontender, no distention.  Neurologic: Alert and oriented.  Psych: Normal affect. Skin: Normal. Musculoskeletal: No gross deformities.    ECG: Most recent ECG reviewed.      ASSESSMENT AND PLAN: 1. Atrial fibrillation: Symptomatically stable. Rate-controlled on diltiazem and on Xarelto for anticoagulation. No changes.  2. Essential HTN: Controlled on current medical therapy. No changes.  3. Mitral regurgitation: No appreciable murmur, stable.   Dispo: fu 1 yr  Kate Sable, M.D., F.A.C.C.

## 2015-11-15 ENCOUNTER — Ambulatory Visit (INDEPENDENT_AMBULATORY_CARE_PROVIDER_SITE_OTHER): Payer: Medicare Other | Admitting: Orthopaedic Surgery

## 2015-11-15 DIAGNOSIS — M25562 Pain in left knee: Secondary | ICD-10-CM | POA: Diagnosis not present

## 2015-11-22 ENCOUNTER — Ambulatory Visit (INDEPENDENT_AMBULATORY_CARE_PROVIDER_SITE_OTHER): Payer: Medicare Other | Admitting: Orthopaedic Surgery

## 2015-11-22 ENCOUNTER — Encounter (INDEPENDENT_AMBULATORY_CARE_PROVIDER_SITE_OTHER): Payer: Self-pay | Admitting: Orthopaedic Surgery

## 2015-11-22 DIAGNOSIS — M25562 Pain in left knee: Secondary | ICD-10-CM | POA: Diagnosis not present

## 2015-11-22 DIAGNOSIS — G8929 Other chronic pain: Secondary | ICD-10-CM | POA: Diagnosis not present

## 2015-11-22 NOTE — Progress Notes (Signed)
Office Visit Note   Patient: Tracy Johnston           Date of Birth: [birth year]           MRN: [id number] Visit Date: 11/22/2015              Requested by: Zella Richer. [address], [place], [place] [address] PCP: Deloria Lair, MD   Assessment & Plan: Visit Diagnoses:  1. 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  Plan: Patient can return when she like to have the knee aspirated and injected. We discussed options with the repeat cortisone injection which previously gave her some relief prepared a time. No meniscal injury no loose body present. She does have significant joint effusion which is related to bone infarcts most likely she does have a history of gout but fluid was clear and she is taking allopurinol regularly and has had satisfactory uric acid. MRI scan is reviewed I gave her a copy. She will return if she like to have the aspiration and injection  Follow-Up Instructions: Return if symptoms worsen or fail to improve.   Orders:  No orders of the defined types were placed in this encounter.  No orders of the defined types were placed in this encounter.     Procedures: No procedures performed   Clinical Data: No additional findings.   Subjective: Chief Complaint  Patient presents with  . Left Knee - Pain    Here to review MRI of left knee. Patient reports no change in her symptoms. She is taking Ibuprofen and Aleve. She gets more relief with the Aleve. She ambulates with a cane when out to steady herself.     Review of Systems   Objective: Vital Signs: BP 130/81   Pulse 85   Ht 5\' 3"  (1.6 m)   Wt 187 lb (84.8 kg)   BMI 33.13 kg/m   Physical Exam  Constitutional: She is oriented to person, place, and time. She appears well-developed.  HENT:  Head: Normocephalic.  Right Ear: External ear normal.  Left Ear: External ear normal.  Eyes: Pupils are equal,  round, and reactive to light.  Neck: No tracheal deviation present. No thyromegaly present.  Cardiovascular: Normal rate.   Pulmonary/Chest: Effort normal.  Abdominal: Soft.  Neurological: She is alert and oriented to person, place, and time.  Skin: Skin is warm and dry.  Psychiatric: She has a normal mood and affect. Her behavior is normal.    Ortho Exam bilateral hip incision she is immature with a cane she has 3-4+ left knee synovitis. Pain with flexion pain with extension immature with the left knee limp. No pitting edema distal pulses are intact negative straight leg raising palpable Baker cyst is present.  Specialty Comments:  No specialty comments available.  Imaging: No results found. <HTML><META HTTP-EQUIV="content-type" CONTENT="text/html;charset=utf-8"><p class=ReportHeader><H1> </H1></DIV><p id=[id number] class=ReportBody><P>CLINICAL DATA: Left knee pain, no known injury, status post fall 3<BR>weeks ago<BR><BR>EXAM:<BR>MRI OF THE LEFT KNEE WITHOUT CONTRAST<BR><BR>TECHNIQUE:<BR>Multiplanar, multisequence MR imaging of the knee was performed. No<BR>intravenous contrast was administered.<BR><BR>COMPARISON: None.<BR><BR>FINDINGS:<BR>MENISCI<BR><BR>Medial meniscus: Intact.<BR><BR>Lateral meniscus: Intact.<BR><BR>LIGAMENTS<BR><BR>Cruciates: Intact ACL and PCL.<BR><BR>Collaterals: Medial collateral ligament is intact. Lateral<BR>collateral ligament complex is intact.<BR><BR>CARTILAGE<BR><BR>Patellofemoral: No chondral defect.<BR><BR>Medial: No chondral defect.<BR><BR>Lateral: No chondral defect.<BR><BR>Joint: Large joint effusion. Normal Hoffa's fat. No plical<BR>thickening.<BR><BR>Popliteal Fossa: No Baker cyst. Intact popliteus tendon.<BR><BR>Extensor Mechanism: Intact quadriceps tendon and patellar tendon.<BR><BR>Bones: Small subchondral serpiginous signal abnormality in the<BR>patella most consistent with a bone infarct. Large  area of<BR>serpiginous T2  hyperintense signal with central T1 hyperintense<BR>signal in the proximal tibial metaphysis and epiphysis consistent<BR>with a bone infarct without surrounding marrow edema. Serpiginous<BR>marrow abnormality in the medial femoral condyle with surrounding<BR>marrow edema most consistent with an acute - subacute bone infarct.<BR>Serpiginous signal abnormality in the lateral femoral condyle<BR>consistent with a bone infarct.<BR><BR>Other: No fluid collection or hematoma.<BR><BR>IMPRESSION:<BR>1. Large joint effusion.<BR>2. 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 Bone marrow edema is<BR>seen surrounding the medial femoral condyle bone infarct most<BR>concerning for an acute-subacute infarct.<BR><BR><BR>Electronically Signed<BR>By: Hetal Patel<BR>On: 11/20/2015 15:07</P></DIV>  PMFS History: Patient Active Problem List   Diagnosis Date Noted  . Pain in left knee 11/22/2015  . Incisional hernia, without obstruction or gangrene 09/09/2012  . 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 Acute blood loss anemia 10/20/2011    Class: Acute  . Avascular necrosis of femur head, left ([facility]) 10/17/2011    Class: Diagnosis of  . COPD (chronic obstructive pulmonary disease) ([facility]) 03/03/2011  . Dyspnea 12/02/2010  . Preop cardiovascular exam 10/31/2010  . Claudication ([facility]) 10/18/2010  . Atrial fibrillation ([facility])   . Tobacco abuse   . Hypertension    Past Medical History:  Diagnosis Date  . Arthritis   . Asthma   . Atrial fibrillation (HCC)    chronic. Was first diagnosed in her early 52s.  . Cancer ([facility]) 2003ish   , cervix  . COPD (chronic obstructive pulmonary disease) ([facility])   . Heart murmur   . Hypertension   . Insomnia   . Malignant neoplasm of kidney ([facility])    rt removed 2003  . Mitral regurgitation    Mild to moderate. Normal EF 09/2010  . Renal insufficiency   . Sickle cell anemia  (HCC)   . Tobacco abuse     Family History  Problem Relation Age of Onset  . Diabetes Mother   . Cancer Mother     ovarian    Past Surgical History:  Procedure Laterality Date  . arm fracture     rods, pins and bone graft.- to left arm from hip  . BONE GRAFT HIP ILIAC CREST     to left arm  . CERVICAL CONIZATION W/BX    . NEPHRECTOMY  2003  . TOTAL HIP ARTHROPLASTY  10/17/2011   Procedure: TOTAL HIP ARTHROPLASTY;  Surgeon: [name], MD;  Location: [facility];  Service: Orthopedics;  Laterality: Left;  Left Total Hip Arthroplasty  . TOTAL HIP ARTHROPLASTY  01/02/2012   Procedure: TOTAL HIP ARTHROPLASTY;  Surgeon: [name], MD;  Location: [facility];  Service: Orthopedics;  Laterality: Right;  Right Total Hip Arthroplasty  . TUBAL LIGATION     Social History   Occupational History  . Not on file.   Social History Main Topics  . Smoking status: Former Smoker    Packs/day: 1.00    Years: 40.00    Types: Cigarettes    Start date: 10/21/1983    Quit date: 02/27/2014  . Smokeless tobacco: Never Used     Comment: just quit since getting out of hospital 9/24 restarted smoking 9 months ago-AJ  . Alcohol use 0.0 oz/week     Comment: occasions only 1 glass of wine  . Drug use: No  . Sexual activity: Not on file

## 2015-12-27 ENCOUNTER — Encounter (INDEPENDENT_AMBULATORY_CARE_PROVIDER_SITE_OTHER): Payer: Self-pay | Admitting: Orthopaedic Surgery

## 2015-12-27 ENCOUNTER — Ambulatory Visit (INDEPENDENT_AMBULATORY_CARE_PROVIDER_SITE_OTHER): Payer: Medicare Other | Admitting: Orthopaedic Surgery

## 2015-12-27 VITALS — BP 130/81 | HR 72 | Ht 64.0 in | Wt 190.0 lb

## 2015-12-27 DIAGNOSIS — G8929 Other chronic pain: Secondary | ICD-10-CM | POA: Diagnosis not present

## 2015-12-27 DIAGNOSIS — M25562 Pain in left knee: Secondary | ICD-10-CM

## 2015-12-27 MED ORDER — METHYLPREDNISOLONE ACETATE 40 MG/ML IJ SUSP
40.0000 mg | INTRAMUSCULAR | Status: AC | PRN
  Administered 2015-12-27: 40 mg via INTRA_ARTICULAR

## 2015-12-27 MED ORDER — BUPIVACAINE HCL 0.25 % IJ SOLN
0.6600 mL | INTRAMUSCULAR | Status: AC | PRN
  Administered 2015-12-27: .66 mL via INTRA_ARTICULAR

## 2015-12-27 MED ORDER — LIDOCAINE HCL 1 % IJ SOLN
1.0000 mL | INTRAMUSCULAR | Status: AC | PRN
  Administered 2015-12-27: 1 mL

## 2015-12-27 NOTE — Progress Notes (Signed)
Office Visit Note   Patient: Tracy Johnston           Date of Birth: January 08, 1951           MRN: [id number] Visit Date: 12/27/2015              Requested by: Zella Richer. [address], [place], [place] [address] PCP: Deloria Lair, MD   Assessment & Plan: Visit Diagnoses:  1. Chronic pain of left knee     Plan: Articular injection performed left hip patient's request. She was able tolerate the procedure but does not tolerate pain well. We'll check her back again on a when necessary basis.  Follow-Up Instructions: Return if symptoms worsen or fail to improve.   Orders:  No orders of the defined types were placed in this encounter.  No orders of the defined types were placed in this encounter.     Procedures: Large Joint Inj Date/Time: 12/27/2015 10:13 AM Performed by: [name] Authorized by: Rodell Perna C   Consent Given by:  Patient Indications:  Pain and joint swelling Location:  Knee Site:  L knee Needle Size:  22 G Needle Length:  1.5 inches Approach:  Anterolateral Ultrasound Guidance: No   Fluoroscopic Guidance: No   Arthrogram: No   Medications:  1 mL lidocaine 1 %; 40 mg methylPREDNISolone acetate 40 MG/ML; 0.66 mL bupivacaine 0.25 % Aspiration Attempted: No   Patient tolerance:  Patient tolerated the procedure well with no immediate complications     Clinical Data: No additional findings.   Subjective: Chief Complaint  Patient presents with  . Left Knee - Pain    Patient returns for left knee pain. She requests injection today.     Review of Systems  Constitutional: Negative for chills and diaphoresis.  HENT: Negative for ear discharge, ear pain and nosebleeds.   Eyes: Negative for discharge and visual disturbance.  Respiratory: Positive for choking, shortness of breath and wheezing. Negative for cough.   Cardiovascular: Negative for chest pain and palpitations.  Gastrointestinal: Negative for abdominal distention and abdominal  pain.  Endocrine: Negative for cold intolerance and heat intolerance.  Genitourinary: Negative for flank pain and hematuria.  Musculoskeletal:       Previous total hip arthroplasties. MRI scans of shown avascular changes about the knee both right and left.  Skin: Negative for rash and wound.  Neurological: Negative for seizures and speech difficulty.  Hematological: Negative for adenopathy. Does not bruise/bleed easily.  Psychiatric/Behavioral: Negative for agitation and suicidal ideas.     Objective: Vital Signs: BP 130/81   Pulse 72   Ht 5\' 4"  (1.626 m)   Wt 190 lb (86.2 kg)   BMI 32.61 kg/m   Physical Exam  Constitutional: She is oriented to person, place, and time. She appears well-developed.  HENT:  Head: Normocephalic.  Right Ear: External ear normal.  Left Ear: External ear normal.  Eyes: Pupils are equal, round, and reactive to light.  Neck: No tracheal deviation present. No thyromegaly present.  Cardiovascular: Normal rate.   Pulmonary/Chest: Effort normal. She has wheezes.  Abdominal: Soft.  Musculoskeletal:  Patient has tenderness with knee range of motion. Mild synovitis left knee. Pulses intact no rash over exposed skin negative logroll to the hip she's had previous hip arthroplasty from posterior approach.  Neurological: She is alert and oriented to person, place, and time.  Skin: Skin is warm and dry.  Psychiatric: She has a normal mood and affect. Her behavior  is normal.    Ortho Exam  Specialty Comments:  No specialty comments available.  Imaging: No results found.   PMFS History: Patient Active Problem List   Diagnosis Date Noted  . Pain in left knee 11/22/2015  . Incisional hernia, without obstruction or gangrene 09/09/2012  . 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 Acute blood loss anemia 10/20/2011    Class: Acute  . Avascular necrosis of femur head, left ([facility]) 10/17/2011    Class: Diagnosis of    . COPD (chronic obstructive pulmonary disease) ([facility]) 03/03/2011  . Dyspnea 12/02/2010  . Preop cardiovascular exam 10/31/2010  . Claudication ([facility]) 10/18/2010  . Atrial fibrillation ([facility])   . Tobacco abuse   . Hypertension    Past Medical History:  Diagnosis Date  . Arthritis   . Asthma   . Atrial fibrillation (HCC)    chronic. Was first diagnosed in her early 73s.  . Cancer ([facility]) 2003ish   , cervix  . COPD (chronic obstructive pulmonary disease) ([facility])   . Heart murmur   . Hypertension   . Insomnia   . Malignant neoplasm of kidney ([facility])    rt removed 2003  . Mitral regurgitation    Mild to moderate. Normal EF 09/2010  . Renal insufficiency   . Sickle cell anemia (HCC)   . Tobacco abuse     Family History  Problem Relation Age of Onset  . Diabetes Mother   . Cancer Mother     ovarian    Past Surgical History:  Procedure Laterality Date  . arm fracture     rods, pins and bone graft.- to left arm from hip  . BONE GRAFT HIP ILIAC CREST     to left arm  . CERVICAL CONIZATION W/BX    . NEPHRECTOMY  2003  . TOTAL HIP ARTHROPLASTY  10/17/2011   Procedure: TOTAL HIP ARTHROPLASTY;  Surgeon: [name], MD;  Location: [facility];  Service: Orthopedics;  Laterality: Left;  Left Total Hip Arthroplasty  . TOTAL HIP ARTHROPLASTY  01/02/2012   Procedure: TOTAL HIP ARTHROPLASTY;  Surgeon: [name], MD;  Location: [facility];  Service: Orthopedics;  Laterality: Right;  Right Total Hip Arthroplasty  . TUBAL LIGATION     Social History   Occupational History  . Not on file.   Social History Main Topics  . Smoking status: Former Smoker    Packs/day: 1.00    Years: 40.00    Types: Cigarettes    Start date: 10/21/1983    Quit date: 02/27/2014  . Smokeless tobacco: Never Used     Comment: just quit since getting out of hospital 9/24 restarted smoking 9 months ago-AJ  . Alcohol use 0.0 oz/week     Comment: occasions only 1 glass of wine  . Drug use: No  . Sexual activity: Not on file

## 2016-01-03 ENCOUNTER — Ambulatory Visit (INDEPENDENT_AMBULATORY_CARE_PROVIDER_SITE_OTHER): Payer: Medicare Other | Admitting: Orthopaedic Surgery

## 2016-01-24 ENCOUNTER — Other Ambulatory Visit (INDEPENDENT_AMBULATORY_CARE_PROVIDER_SITE_OTHER): Payer: Self-pay | Admitting: Radiology

## 2016-01-24 MED ORDER — ALLOPURINOL 300 MG PO TABS: 300.0000 mg | ORAL_TABLET | Freq: Every day | ORAL | 5 refills | Status: DC

## 2016-02-14 ENCOUNTER — Ambulatory Visit (INDEPENDENT_AMBULATORY_CARE_PROVIDER_SITE_OTHER): Payer: Medicare Other | Admitting: Orthopaedic Surgery

## 2016-02-21 ENCOUNTER — Ambulatory Visit (INDEPENDENT_AMBULATORY_CARE_PROVIDER_SITE_OTHER): Payer: Medicare Other | Admitting: Orthopaedic Surgery

## 2016-05-29 ENCOUNTER — Ambulatory Visit (INDEPENDENT_AMBULATORY_CARE_PROVIDER_SITE_OTHER): Payer: Medicare Other | Admitting: Orthopaedic Surgery

## 2016-05-29 ENCOUNTER — Encounter (INDEPENDENT_AMBULATORY_CARE_PROVIDER_SITE_OTHER): Payer: Self-pay | Admitting: Orthopaedic Surgery

## 2016-05-29 VITALS — BP 109/72 | HR 80 | Ht 63.0 in | Wt 190.0 lb

## 2016-05-29 DIAGNOSIS — G8929 Other chronic pain: Secondary | ICD-10-CM

## 2016-05-29 DIAGNOSIS — M25562 Pain in left knee: Secondary | ICD-10-CM

## 2016-05-29 MED ORDER — LIDOCAINE HCL 1 % IJ SOLN
1.0000 mL | INTRAMUSCULAR | Status: AC | PRN
  Administered 2016-05-29: 1 mL

## 2016-05-29 MED ORDER — BUPIVACAINE HCL 0.25 % IJ SOLN
0.6600 mL | INTRAMUSCULAR | Status: AC | PRN
  Administered 2016-05-29: .66 mL via INTRA_ARTICULAR

## 2016-05-29 MED ORDER — METHYLPREDNISOLONE ACETATE 40 MG/ML IJ SUSP
40.0000 mg | INTRAMUSCULAR | Status: AC | PRN
  Administered 2016-05-29: 40 mg via INTRA_ARTICULAR

## 2016-05-29 NOTE — Progress Notes (Signed)
Office Visit Note   Patient: Tracy Johnston           Date of Birth: 09/14/1950           MRN: [id number] Visit Date: 05/29/2016              Requested by: Zella Richer. [address], [place], [place] [address] PCP: Deloria Lair, MD   Assessment & Plan: Visit Diagnoses:  1. Chronic pain of left knee     Plan: Left knee injection performed for knee synovitis she got good relief and was able ambulate more comfortably. She'll continue to work on efforts for smoking sensation try to increase her ambulation and she can return when necessary  Follow-Up Instructions: Return if symptoms worsen or fail to improve.   Orders:  Orders Placed This Encounter  Procedures  . Large Joint Injection/Arthrocentesis   No orders of the defined types were placed in this encounter.     Procedures: Large Joint Inj Date/Time: 05/29/2016 10:13 AM Performed by: [name] Authorized by: Rodell Perna C   Consent Given by:  Patient Indications:  Pain and joint swelling Location:  Knee Site:  L knee Needle Size:  22 G Needle Length:  1.5 inches Approach:  Anterolateral Ultrasound Guidance: No   Fluoroscopic Guidance: No   Arthrogram: No   Medications:  1 mL lidocaine 1 %; 40 mg methylPREDNISolone acetate 40 MG/ML; 0.66 mL bupivacaine 0.25 % Aspiration Attempted: No   Patient tolerance:  Patient tolerated the procedure well with no immediate complications     Clinical Data: No additional findings.   Subjective: Chief Complaint  Patient presents with  . Left Knee - Pain    HPI patient's current strength of recurrent increased pain left knee.: Previous injection 6 months ago which did well for. Time. Her symptoms are recurrent still has some problems from her total hip arthroplasties and continues to smoke still short of breath. It's the knee is painful and swells whenever she is walking. She's had x-rays demonstrated osteoarthritis in the knee. She denies associated chills or  fever. She still a mature with a the cane that she is in the right hand. No associated back pain with this. Patient requests an injection in her left knee today. Negative history for gout.  Review of Systems 14 point review of systems is updated from 6 months ago and is unchanged other than as mentioned above with recurrent left knee pain.   Objective: Vital Signs: BP 109/72   Pulse 80   Ht 5\' 3"  (1.6 m)   Wt 190 lb (86.2 kg)   BMI 33.66 kg/m   Physical Exam  Constitutional: She is oriented to person, place, and time. She appears well-developed.  HENT:  Head: Normocephalic.  Right Ear: External ear normal.  Left Ear: External ear normal.  Eyes: Pupils are equal, round, and reactive to light.  Neck: No tracheal deviation present. No thyromegaly present.  Cardiovascular: Normal rate.   Pulmonary/Chest:  Increased respiratory effort. Prolonged expiratory phase. She has rales that clear with cough.  Abdominal: Soft.  Musculoskeletal:  Well-healed hip incisions noted. No pain with hip range of motion. She still has global weakness around her hips. Left knee shows 2+ synovitis medial lateral joint line tenderness crepitus with knee range of motion.  Neurological: She is alert and oriented to person, place, and time.  Skin: Skin is warm and dry.  Psychiatric: She has a normal mood and affect. Her behavior is  normal.    Ortho Exam knee ankle jerk are 2+ anterior tib EHL is intact Straight leg raising negative popped a compression test. Pes bursa was normal right and left. Distal pulses are intact.  Specialty Comments:  No specialty comments available.  Imaging: No results found.   PMFS History: Patient Active Problem List   Diagnosis Date Noted  . Pain in left knee 11/22/2015  . Incisional hernia, without obstruction or gangrene 09/09/2012  . 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 Acute blood loss anemia 10/20/2011    Class: Acute   . Avascular necrosis of femur head, left ([facility]) 10/17/2011    Class: Diagnosis of  . COPD (chronic obstructive pulmonary disease) ([facility]) 03/03/2011  . Dyspnea 12/02/2010  . Preop cardiovascular exam 10/31/2010  . Claudication ([facility]) 10/18/2010  . Atrial fibrillation ([facility])   . Tobacco abuse   . Hypertension    Past Medical History:  Diagnosis Date  . Arthritis   . Asthma   . Atrial fibrillation (HCC)    chronic. Was first diagnosed in her early 42s.  . 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 COPD (chronic obstructive pulmonary disease) ([facility])   . Heart murmur   . Hypertension   . Insomnia   . Malignant neoplasm of kidney ([facility])    rt removed 2003  . Mitral regurgitation    Mild to moderate. Normal EF 09/2010  . Renal insufficiency   . Sickle cell anemia (HCC)   . Tobacco abuse     Family History  Problem Relation Age of Onset  . Diabetes Mother   . Cancer Mother     ovarian    Past Surgical History:  Procedure Laterality Date  . arm fracture     rods, pins and bone graft.- to left arm from hip  . BONE GRAFT HIP ILIAC CREST     to left arm  . CERVICAL CONIZATION W/BX    . NEPHRECTOMY  2003  . TOTAL HIP ARTHROPLASTY  10/17/2011   Procedure: TOTAL HIP ARTHROPLASTY;  Surgeon: [name], MD;  Location: [facility];  Service: Orthopedics;  Laterality: Left;  Left Total Hip Arthroplasty  . TOTAL HIP ARTHROPLASTY  01/02/2012   Procedure: TOTAL HIP ARTHROPLASTY;  Surgeon: [name], MD;  Location: [facility];  Service: Orthopedics;  Laterality: Right;  Right Total Hip Arthroplasty  . TUBAL LIGATION     Social History   Occupational History  . Not on file.   Social History Main Topics  . Smoking status: Former Smoker    Packs/day: 1.00    Years: 40.00    Types: Cigarettes    Start date: 10/21/1983    Quit date: 02/27/2014  . Smokeless tobacco: Never Used     Comment: just quit since getting out of hospital 9/24 restarted smoking 9 months ago-AJ  . Alcohol use 0.0 oz/week      Comment: occasions only 1 glass of wine  . Drug use: No  . Sexual activity: Not on file

## 2016-06-15 ENCOUNTER — Other Ambulatory Visit: Payer: Self-pay | Admitting: Cardiovascular Disease

## 2016-09-10 ENCOUNTER — Other Ambulatory Visit (INDEPENDENT_AMBULATORY_CARE_PROVIDER_SITE_OTHER): Payer: Self-pay | Admitting: Orthopaedic Surgery

## 2016-09-10 NOTE — Telephone Encounter (Signed)
Please advise 

## 2016-11-03 ENCOUNTER — Other Ambulatory Visit (INDEPENDENT_AMBULATORY_CARE_PROVIDER_SITE_OTHER): Payer: Self-pay | Admitting: Orthopaedic Surgery

## 2016-11-03 NOTE — Telephone Encounter (Signed)
Ok for refill? 

## 2016-11-07 ENCOUNTER — Encounter (INDEPENDENT_AMBULATORY_CARE_PROVIDER_SITE_OTHER): Payer: Self-pay

## 2016-11-07 ENCOUNTER — Encounter: Payer: Self-pay | Admitting: Cardiovascular Disease

## 2016-11-07 ENCOUNTER — Ambulatory Visit (INDEPENDENT_AMBULATORY_CARE_PROVIDER_SITE_OTHER): Payer: Medicare Other | Admitting: Cardiovascular Disease

## 2016-11-07 VITALS — BP 144/76 | HR 87 | Ht 64.0 in | Wt 195.2 lb

## 2016-11-07 DIAGNOSIS — I340 Nonrheumatic mitral (valve) insufficiency: Secondary | ICD-10-CM

## 2016-11-07 DIAGNOSIS — I482 Chronic atrial fibrillation, unspecified: Secondary | ICD-10-CM

## 2016-11-07 DIAGNOSIS — I10 Essential (primary) hypertension: Secondary | ICD-10-CM

## 2016-11-07 NOTE — Patient Instructions (Signed)
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
## 2016-12-15 ENCOUNTER — Other Ambulatory Visit: Payer: Self-pay | Admitting: Cardiovascular Disease

## 2017-02-06 DIAGNOSIS — M545 Low back pain, unspecified: Secondary | ICD-10-CM | POA: Insufficient documentation

## 2017-02-06 DIAGNOSIS — G8929 Other chronic pain: Secondary | ICD-10-CM | POA: Insufficient documentation

## 2017-02-06 DIAGNOSIS — G894 Chronic pain syndrome: Secondary | ICD-10-CM | POA: Insufficient documentation

## 2017-02-06 DIAGNOSIS — Z79899 Other long term (current) drug therapy: Secondary | ICD-10-CM | POA: Insufficient documentation

## 2017-02-18 DIAGNOSIS — Z79891 Long term (current) use of opiate analgesic: Secondary | ICD-10-CM | POA: Insufficient documentation

## 2017-04-08 ENCOUNTER — Other Ambulatory Visit (INDEPENDENT_AMBULATORY_CARE_PROVIDER_SITE_OTHER): Payer: Self-pay | Admitting: Orthopaedic Surgery

## 2017-04-08 NOTE — Telephone Encounter (Signed)
She is on Xeralto now and is not supposed to be prescribed ibuprofen. [facility]. She can use tylenol OTC. thanks

## 2017-04-08 NOTE — Telephone Encounter (Signed)
Ok for refill? 

## 2017-04-21 ENCOUNTER — Telehealth (INDEPENDENT_AMBULATORY_CARE_PROVIDER_SITE_OTHER): Payer: Self-pay | Admitting: Radiology

## 2017-04-21 MED ORDER — IBUPROFEN 800 MG PO TABS: ORAL_TABLET | ORAL | 0 refills | Status: DC

## 2017-04-21 NOTE — Telephone Encounter (Signed)
Received fax from [facility] requesting refill on Ibuprofen 800mg  1po bid with food #60.  OK for refill?

## 2017-04-21 NOTE — Telephone Encounter (Signed)
Ok thx.

## 2017-04-21 NOTE — Telephone Encounter (Signed)
Script sent to pharmacy.

## 2017-05-19 ENCOUNTER — Other Ambulatory Visit (INDEPENDENT_AMBULATORY_CARE_PROVIDER_SITE_OTHER): Payer: Self-pay | Admitting: Orthopaedic Surgery

## 2017-05-19 ENCOUNTER — Other Ambulatory Visit (INDEPENDENT_AMBULATORY_CARE_PROVIDER_SITE_OTHER): Payer: Self-pay | Admitting: Radiology

## 2017-05-19 NOTE — Telephone Encounter (Signed)
Rx refill

## 2017-05-19 NOTE — Telephone Encounter (Signed)
[facility] pharmacy and advised cancel ibu Rx per Lorin Mercy, patient cannot take with xarelto.  Pharmacy will advised patient, will also advise tylenol OTC can be used instead.

## 2017-07-09 ENCOUNTER — Ambulatory Visit (INDEPENDENT_AMBULATORY_CARE_PROVIDER_SITE_OTHER): Payer: Medicare Other | Admitting: Orthopaedic Surgery

## 2017-07-09 ENCOUNTER — Encounter (INDEPENDENT_AMBULATORY_CARE_PROVIDER_SITE_OTHER): Payer: Self-pay | Admitting: Orthopaedic Surgery

## 2017-07-09 VITALS — BP 118/73 | HR 84 | Ht 63.0 in | Wt 195.0 lb

## 2017-07-09 DIAGNOSIS — Z96643 Presence of artificial hip joint, bilateral: Secondary | ICD-10-CM | POA: Diagnosis not present

## 2017-07-09 DIAGNOSIS — M1712 Unilateral primary osteoarthritis, left knee: Secondary | ICD-10-CM

## 2017-07-09 MED ORDER — BUPIVACAINE HCL 0.5 % IJ SOLN
3.0000 mL | INTRAMUSCULAR | Status: AC | PRN
  Administered 2017-07-09: 3 mL via INTRA_ARTICULAR

## 2017-07-09 MED ORDER — METHYLPREDNISOLONE ACETATE 40 MG/ML IJ SUSP
40.0000 mg | INTRAMUSCULAR | Status: AC | PRN
  Administered 2017-07-09: 40 mg via INTRA_ARTICULAR

## 2017-07-09 MED ORDER — LIDOCAINE HCL 1 % IJ SOLN
0.5000 mL | INTRAMUSCULAR | Status: AC | PRN
  Administered 2017-07-09: .5 mL

## 2017-07-09 NOTE — Progress Notes (Signed)
Office Visit Note   Patient: Tracy Johnston           Date of Birth: [birth year]           MRN: [id number] Visit Date: 07/09/2017              Requested by: Deloria Lair., MD [place], [place] [id number] PCP: Deloria Lair., MD   Assessment & Plan: Visit Diagnoses:  1. Unilateral primary osteoarthritis, left knee   2. History of bilateral hip arthroplasty     Plan: Patient work on smoking cessation.  Hip arthroplasty well.  Office follow-up for her knee if she has increased symptoms.  Follow-Up Instructions: Return if symptoms worsen or fail to improve.   Orders:  Orders Placed This Encounter  Procedures  . Large Joint Inj   No orders of the defined types were placed in this encounter.     Procedures: Large Joint Inj: L knee on 07/09/2017 11:30 AM Indications: joint swelling and pain Details: 22 G 1.5 in needle, anterolateral approach  Arthrogram: No  Medications: 0.5 mL lidocaine 1 %; 3 mL bupivacaine 0.5 %; 40 mg methylPREDNISolone acetate 40 MG/ML Outcome: tolerated well, no immediate complications Procedure, treatment alternatives, risks and benefits explained, specific risks discussed. Consent was given by the patient. Immediately prior to procedure a time out was called to verify the correct patient, procedure, equipment, support staff and site/side marked as required. Patient was prepped and draped in the usual sterile fashion.       Clinical Data: No additional findings.   Subjective: Chief Complaint  Patient presents with  . Left Knee - Pain    HPI 67 year old female returns with recurrent left knee pain with osteoarthritis.  She had previous injection 1 year ago and requests a repeat injection.  She IS  still smoking trying to cut back has COPD and still coughs with rales.  Previous x-rays that demonstrated knee osteoarthritis.  Review of Systems 14 point review of systems updated and unchanged from 11/22/2015 office visit other than  as mentioned above in HPI.   Objective: Vital Signs: BP 118/73   Pulse 84   Ht 5\' 3"  (1.6 m)   Wt 195 lb (88.5 kg)   BMI 34.54 kg/m   Physical Exam  Constitutional: She is oriented to person, place, and time. She appears well-developed.  HENT:  Head: Normocephalic.  Right Ear: External ear normal.  Left Ear: External ear normal.  Eyes: Pupils are equal, round, and reactive to light.  Neck: No tracheal deviation present. No thyromegaly present.  Cardiovascular: Normal rate.  Pulmonary/Chest:  Prolonged expiratory phrase.  Wheezing noted with coarse rhonchi that clear with cough.  Abdominal: Soft.  Neurological: She is alert and oriented to person, place, and time.  Skin: Skin is warm and dry.  Psychiatric: She has a normal mood and affect. Her behavior is normal.    Ortho Exam Left greater than right knee crepitus with range of motion.  Mild knee effusion. Specialty Comments:  No specialty comments available.  Imaging: No results found.   PMFS History: Patient Active Problem List   Diagnosis Date Noted  . Pain in left knee 11/22/2015  . Incisional hernia, without obstruction or gangrene 09/09/2012  . Acute blood loss anemia 10/20/2011    Class: Acute  . COPD (chronic obstructive pulmonary disease) ([facility]) 03/03/2011  . Dyspnea 12/02/2010  . Preop cardiovascular exam 10/31/2010  . Claudication ([facility]) 10/18/2010  . Atrial fibrillation ([facility])   .  Tobacco abuse   . Hypertension    Past Medical History:  Diagnosis Date  . Arthritis   . Asthma   . Atrial fibrillation (HCC)    chronic. Was first diagnosed in her early 46s.  . Cancer ([facility]) 2003ish   , cervix  . COPD (chronic obstructive pulmonary disease) ([facility])   . Heart murmur   . Hypertension   . Insomnia   . Malignant neoplasm of kidney ([facility])    rt removed 2003  . Mitral regurgitation    Mild to moderate. Normal EF 09/2010  . Renal insufficiency   . Sickle cell anemia (HCC)   . Tobacco abuse     Family  History  Problem Relation Age of Onset  . Diabetes Mother   . Cancer Mother        ovarian    Past Surgical History:  Procedure Laterality Date  . arm fracture     rods, pins and bone graft.- to left arm from hip  . BONE GRAFT HIP ILIAC CREST     to left arm  . CERVICAL CONIZATION W/BX    . NEPHRECTOMY  2003  . TOTAL HIP ARTHROPLASTY  10/17/2011   Procedure: TOTAL HIP ARTHROPLASTY;  Surgeon: [name], MD;  Location: [facility];  Service: Orthopedics;  Laterality: Left;  Left Total Hip Arthroplasty  . TOTAL HIP ARTHROPLASTY  01/02/2012   Procedure: TOTAL HIP ARTHROPLASTY;  Surgeon: [name], MD;  Location: [facility];  Service: Orthopedics;  Laterality: Right;  Right Total Hip Arthroplasty  . TUBAL LIGATION     Social History   Occupational History  . Not on file  Tobacco Use  . Smoking status: Current Some Day Smoker    Packs/day: 1.00    Years: 40.00    Pack years: 40.00    Types: Cigarettes    Start date: 10/21/1983    Last attempt to quit: 02/27/2014    Years since quitting: 3.3  . Smokeless tobacco: Never Used  . Tobacco comment: just quit since getting out of hospital 9/24 restarted smoking 9 months ago-AJ  Substance and Sexual Activity  . Alcohol use: Yes    Alcohol/week: 0.0 oz    Comment: occasions only 1 glass of wine  . Drug use: No  . Sexual activity: Not on file

## 2017-10-30 ENCOUNTER — Encounter: Payer: Self-pay | Admitting: Cardiovascular Disease

## 2017-10-30 ENCOUNTER — Ambulatory Visit (INDEPENDENT_AMBULATORY_CARE_PROVIDER_SITE_OTHER): Payer: Medicare Other | Admitting: Cardiovascular Disease

## 2017-10-30 VITALS — BP 142/84 | HR 86 | Ht 62.0 in | Wt 198.0 lb

## 2017-10-30 DIAGNOSIS — R600 Localized edema: Secondary | ICD-10-CM | POA: Diagnosis not present

## 2017-10-30 DIAGNOSIS — I4821 Permanent atrial fibrillation: Secondary | ICD-10-CM | POA: Diagnosis not present

## 2017-10-30 DIAGNOSIS — I10 Essential (primary) hypertension: Secondary | ICD-10-CM

## 2017-10-30 DIAGNOSIS — I340 Nonrheumatic mitral (valve) insufficiency: Secondary | ICD-10-CM | POA: Diagnosis not present

## 2017-10-30 NOTE — Progress Notes (Signed)
SUBJECTIVE: The patient presents for routine follow-up.  She has a history of permanent atrial fibrillation, hypertension, and mitral regurgitation.  Her most recent stress test was in April 2015. Perfusion imaging showed a small anteroseptal, partially reversible defect of moderate intensity. Although ischemia could not be excluded in the distribution of the mid to distal LAD, variable soft tissue attenuation may have also been responsible. Regional wall motion was normal. Her most recent echo was in September 2012 and revealed normal LV systolic function, EF 33-29%, moderate LVH, mild to moderate MR, and mild BAE.  She denies chest pain, palpitations, orthopnea, and paroxysmal nocturnal dyspnea.  She had been having some bilateral leg swelling and her PCP apparently put her on Lasix for about 10 days.  She reportedly obtain lower extremity Dopplers at [facility] which were negative for DVT as per the patient.  She stays very active and cooks dinner regular for family and friends.  She is looking forward to joining [employer] in [place] this month.  ECG performed in the office today which I ordered and personally interpreted demonstrates rate controlled atrial fibrillation.   Review of Systems: As per "subjective", otherwise negative.  Allergies  Allergen Reactions  . Bupropion Hives  . Aspirin Other (See Comments)    Bleeding at higher doses  . Latex Rash    When she wears gloves    Current Outpatient Medications  Medication Sig Dispense Refill  . albuterol (ACCUNEB) 0.63 MG/3ML nebulizer solution Take 1 ampule by nebulization every 6 (six) hours as needed. For shortness of breath    . allopurinol (ZYLOPRIM) 300 MG tablet TAKE 1 TABLET BY MOUTH DAILY. 30 tablet 5  . Cholecalciferol (VITAMIN D) 2000 units CAPS Take 1 capsule by mouth daily.    Marland Kitchen diltiazem (CARDIZEM CD) 240 MG 24 hr capsule Take 1 capsule (240 mg total) by mouth daily. 30 capsule 6  . docusate sodium  (COLACE) 100 MG capsule Take 100 mg by mouth 2 (two) times daily.     . Fluticasone-Salmeterol (ADVAIR) 250-50 MCG/DOSE AEPB Inhale 1 puff into the lungs every 12 (twelve) hours.     Marland Kitchen ibuprofen (ADVIL,MOTRIN) 800 MG tablet Take 800 mg by mouth every 8 (eight) hours as needed.    . Magnesium 500 MG CAPS Take 500 mg by mouth.    . montelukast (SINGULAIR) 10 MG tablet Take 10 mg by mouth at bedtime.    Marland Kitchen MYRBETRIQ 50 MG TB24 tablet Take 1 tablet by mouth daily.    Marland Kitchen olmesartan-hydrochlorothiazide (BENICAR HCT) 40-12.5 MG per tablet Take 1 tablet by mouth daily.      Marland Kitchen oxymorphone (OPANA) 10 MG tablet Take 10 mg by mouth every 8 (eight) hours as needed.     . predniSONE (DELTASONE) 20 MG tablet Take 2 tablets by mouth daily as needed.  0  . PROAIR HFA 108 (90 Base) MCG/ACT inhaler     . simvastatin (ZOCOR) 20 MG tablet Take 20 mg by mouth daily.    . vitamin C (ASCORBIC ACID) 500 MG tablet Take 500 mg by mouth daily.    Alveda Reasons 20 MG TABS tablet TAKE ONE TABLET BY MOUTH EVERY DAY 30 tablet 11   No current facility-administered medications for this visit.     Past Medical History:  Diagnosis Date  . Arthritis   . Asthma   . Atrial fibrillation (HCC)    chronic. Was first diagnosed in her early 20s.  . Cancer ([facility]) 2003ish   ,  cervix  . COPD (chronic obstructive pulmonary disease) ([facility])   . Heart murmur   . Hypertension   . Insomnia   . Malignant neoplasm of kidney ([place])    rt removed 2003  . Mitral regurgitation    Mild to moderate. Normal EF 09/2010  . Renal insufficiency   . Sickle cell anemia (HCC)   . Tobacco abuse     Past Surgical History:  Procedure Laterality Date  . arm fracture     rods, pins and bone graft.- to left arm from hip  . BONE GRAFT HIP ILIAC CREST     to left arm  . CERVICAL CONIZATION W/BX    . NEPHRECTOMY  2003  . TOTAL HIP ARTHROPLASTY  10/17/2011   Procedure: TOTAL HIP ARTHROPLASTY;  Surgeon: [name], MD;  Location: [facility];  Service:  Orthopedics;  Laterality: Left;  Left Total Hip Arthroplasty  . TOTAL HIP ARTHROPLASTY  01/02/2012   Procedure: TOTAL HIP ARTHROPLASTY;  Surgeon: [name], MD;  Location: [facility];  Service: Orthopedics;  Laterality: Right;  Right Total Hip Arthroplasty  . TUBAL LIGATION      Social History   Socioeconomic History  . Marital status: Single    Spouse name: Not on file  . Number of children: Not on file  . Years of education: Not on file  . Highest education level: Not on file  Occupational History  . Not on file  Social Needs  . Financial resource strain: Not on file  . Food insecurity:    Worry: Not on file    Inability: Not on file  . Transportation needs:    Medical: Not on file    Non-medical: Not on file  Tobacco Use  . Smoking status: Current Some Day Smoker    Packs/day: 1.00    Years: 40.00    Pack years: 40.00    Types: Cigarettes    Start date: 10/21/1983    Last attempt to quit: 02/27/2014    Years since quitting: 3.6  . Smokeless tobacco: Never Used  . Tobacco comment: just quit since getting out of hospital 9/24 restarted smoking 9 months ago-AJ  Substance and Sexual Activity  . Alcohol use: Yes    Alcohol/week: 0.0 standard drinks    Comment: occasions only 1 glass of wine  . Drug use: No  . Sexual activity: Not on file  Lifestyle  . Physical activity:    Days per week: Not on file    Minutes per session: Not on file  . Stress: Not on file  Relationships  . Social connections:    Talks on phone: Not on file    Gets together: Not on file    Attends religious service: Not on file    Active member of club or organization: Not on file    Attends meetings of clubs or organizations: Not on file    Relationship status: Not on file  . Intimate partner violence:    Fear of current or ex partner: Not on file    Emotionally abused: Not on file    Physically abused: Not on file    Forced sexual activity: Not on file  Other Topics Concern  . Not on file    Social History Narrative  . Not on file     Vitals:   10/30/17 0837  BP: (!) 142/84  Pulse: 86  SpO2: 97%  Weight: 198 lb (89.8 kg)  Height: 5\' 2"  (1.575 m)  Wt Readings from Last 3 Encounters:  10/30/17 198 lb (89.8 kg)  07/09/17 195 lb (88.5 kg)  11/07/16 195 lb 3.2 oz (88.5 kg)     PHYSICAL EXAM General: NAD HEENT: Normal. Neck: No JVD, no thyromegaly. Lungs: Clear to auscultation bilaterally with normal respiratory effort. CV: Regular rate and irregular rhythm, normal S1/S2, no S3, no murmur. No pretibial or periankle edema.  No carotid bruit.   Abdomen: Soft, nontender, no distention.  Neurologic: Alert and oriented.  Psych: Normal affect. Skin: Normal. Musculoskeletal: No gross deformities.    ECG: Reviewed above under Subjective   Labs: Lab Results  Component Value Date/Time   K 3.9 01/03/2012 05:50 AM   BUN 9 01/03/2012 05:50 AM   CREATININE 0.96 01/03/2012 05:50 AM   ALT 9 12/29/2011 03:30 PM   HGB 9.3 (L) 01/05/2012 05:50 AM     Lipids: No results found for: LDLCALC, LDLDIRECT, CHOL, TRIG, HDL     ASSESSMENT AND PLAN:  1.  Permanent atrial fibrillation: Symptomatically stable. Rate-controlled on diltiazem and on Xarelto for anticoagulation. No changes.  2. Essential HTN: Mildly elevated. No changes.  3. Mitral regurgitation: No appreciable murmur, stable.   4.  Bilateral leg swelling: This is likely due to venous insufficiency.  I recommended keeping legs elevated and if further issues arise, compression stockings could be considered.   Disposition: Follow up 1 yr   Kate Sable, M.D., F.A.C.C.

## 2017-10-30 NOTE — Patient Instructions (Signed)
Medication Instructions:  No Changes If you need a refill on your cardiac medications before your next appointment, please call your pharmacy.   Lab work: None If you have labs (blood work) drawn today and your tests are completely normal, you will receive your results only by: Marland Kitchen MyChart Message (if you have MyChart) OR . A paper copy in the mail If you have any lab test that is abnormal or we need to change your treatment, we will call you to review the results.  Testing/Procedures: None  Follow-Up: At [facility], you and your health needs are our priority.  As part of our continuing mission to provide you with exceptional heart care, we have created designated Provider Care Teams.  These Care Teams include your primary Cardiologist (physician) and Advanced Practice Providers (APPs -  Physician Assistants and Nurse Practitioners) who all work together to provide you with the care you need, when you need it. . Dr Bronson Ing in 1 year  Any Other Special Instructions Will Be Listed Below (If Applicable). NONE       Thank you for choosing [facility] !

## 2017-11-12 ENCOUNTER — Other Ambulatory Visit: Payer: Self-pay | Admitting: Cardiovascular Disease

## 2018-03-18 ENCOUNTER — Encounter (INDEPENDENT_AMBULATORY_CARE_PROVIDER_SITE_OTHER): Payer: Self-pay | Admitting: Orthopaedic Surgery

## 2018-03-18 ENCOUNTER — Ambulatory Visit (INDEPENDENT_AMBULATORY_CARE_PROVIDER_SITE_OTHER): Payer: Medicare Other | Admitting: Orthopaedic Surgery

## 2018-03-18 VITALS — BP 89/57 | HR 82 | Ht 63.0 in | Wt 196.0 lb

## 2018-03-18 DIAGNOSIS — M7061 Trochanteric bursitis, right hip: Secondary | ICD-10-CM

## 2018-03-18 MED ORDER — METHYLPREDNISOLONE ACETATE 40 MG/ML IJ SUSP
40.0000 mg | INTRAMUSCULAR | Status: AC | PRN
  Administered 2018-03-18: 40 mg via INTRA_ARTICULAR

## 2018-03-18 MED ORDER — LIDOCAINE HCL 1 % IJ SOLN
0.5000 mL | INTRAMUSCULAR | Status: AC | PRN
  Administered 2018-03-18: .5 mL

## 2018-03-18 MED ORDER — BUPIVACAINE HCL 0.25 % IJ SOLN
2.0000 mL | INTRAMUSCULAR | Status: AC | PRN
  Administered 2018-03-18: 2 mL via INTRA_ARTICULAR

## 2018-03-18 NOTE — Progress Notes (Signed)
Office Visit Note   Patient: Tracy Johnston           Date of Birth: 05-Jun-1950           MRN: [id number] Visit Date: 03/18/2018              Requested by: Sandi Mealy, MD [place], [place] [id number] PCP: Sandi Mealy, MD   Assessment & Plan: Visit Diagnoses:  1. Trochanteric bursitis, right hip     Plan: Right greater trochanteric bursa injection performed with good relief.  She was unable to ambulate after the injection with significant improvement in her pain.  Follow-up PRN.  Follow-Up Instructions: No follow-ups on file.   Orders:  Orders Placed This Encounter  Procedures  . Large Joint Inj: R greater trochanter   No orders of the defined types were placed in this encounter.     Procedures: Large Joint Inj: R greater trochanter on 03/18/2018 10:54 AM Details: 22 G needle, lateral approach Medications: 0.5 mL lidocaine 1 %; 2 mL bupivacaine 0.25 %; 40 mg methylPREDNISolone acetate 40 MG/ML      Clinical Data: No additional findings.   Subjective: Chief Complaint  Patient presents with  . Lower Back - Pain  . Right Hip - Follow-up    HPI 67 year old female returns and states she has had lateral hip pain on the right side for the last 2 weeks.  Pain radiates down to her knee is caused her to limp she still uses her cane.  She was treated with prednisone also had antibiotics for UTI.  She states pain is been worse the last 2 and half weeks no pain below her knee.  Previous bilateral total hip arthroplasties right one was done 01/02/2012.  She had extremely slow progress with postop rehab.  Review of Systems for chronic tobacco abuse atrial for claudication COPD, bilateral total arthroplasties, previous right nephrectomy.  Abdomen CT 02/04/2018 showed some aortoiliac arthrosclerosis previous nephrectomy no acute intra-abdominal findings some evidence of colonic diverticulitis.  Otherwise negative is obtains HPI.   Objective: Vital Signs: BP  (!) 89/57   Pulse 82   Ht 5\' 3"  (1.6 m)   Wt 196 lb (88.9 kg)   BMI 34.72 kg/m   Physical Exam Constitutional:      Appearance: She is well-developed.  HENT:     Head: Normocephalic.     Right Ear: External ear normal.     Left Ear: External ear normal.  Eyes:     Pupils: Pupils are equal, round, and reactive to light.  Neck:     Thyroid: No thyromegaly.     Trachea: No tracheal deviation.  Cardiovascular:     Rate and Rhythm: Normal rate.  Pulmonary:     Effort: Pulmonary effort is normal.  Abdominal:     Palpations: Abdomen is soft.  Skin:    General: Skin is warm and dry.  Neurological:     Mental Status: She is alert and oriented to person, place, and time.  Psychiatric:        Behavior: Behavior normal.     Ortho Exam patient ambulates with a short stride slow deliberate gait.  She has exquisite tenderness palpation of the greater trochanter causing her to cry out.  Negative straight leg raising negative popliteal compression test.  No rash over exposed skin.  Normal capillary refill.  Left greater trochanter palpation is minimally tender.  Specialty Comments:  No specialty comments available.  Imaging: No  results found.   PMFS History: Patient Active Problem List   Diagnosis Date Noted  . History of bilateral hip arthroplasty 07/09/2017  . Pain in left knee 11/22/2015  . Incisional hernia, without obstruction or gangrene 09/09/2012  . Acute blood loss anemia 10/20/2011    Class: Acute  . COPD (chronic obstructive pulmonary disease) ([facility]) 03/03/2011  . Dyspnea 12/02/2010  . Preop cardiovascular exam 10/31/2010  . Claudication ([facility]) 10/18/2010  . Atrial fibrillation ([facility])   . Tobacco abuse   . Hypertension    Past Medical History:  Diagnosis Date  . Arthritis   . Asthma   . Atrial fibrillation (HCC)    chronic. Was first diagnosed in her early 40s.  . Cancer ([facility]) 2003ish   , cervix  . COPD (chronic obstructive pulmonary disease) ([facility])   . Heart  murmur   . Hypertension   . Insomnia   . Malignant neoplasm of kidney ([facility])    rt removed 2003  . Mitral regurgitation    Mild to moderate. Normal EF 09/2010  . Renal insufficiency   . Sickle cell anemia (HCC)   . Tobacco abuse     Family History  Problem Relation Age of Onset  . Diabetes Mother   . Cancer Mother        ovarian    Past Surgical History:  Procedure Laterality Date  . arm fracture     rods, pins and bone graft.- to left arm from hip  . BONE GRAFT HIP ILIAC CREST     to left arm  . CERVICAL CONIZATION W/BX    . NEPHRECTOMY  2003  . TOTAL HIP ARTHROPLASTY  10/17/2011   Procedure: TOTAL HIP ARTHROPLASTY;  Surgeon: [name], MD;  Location: [facility];  Service: Orthopedics;  Laterality: Left;  Left Total Hip Arthroplasty  . TOTAL HIP ARTHROPLASTY  01/02/2012   Procedure: TOTAL HIP ARTHROPLASTY;  Surgeon: [name], MD;  Location: [facility];  Service: Orthopedics;  Laterality: Right;  Right Total Hip Arthroplasty  . TUBAL LIGATION     Social History   Occupational History  . Not on file  Tobacco Use  . Smoking status: Current Some Day Smoker    Packs/day: 1.00    Years: 40.00    Pack years: 40.00    Types: Cigarettes    Start date: 10/21/1983    Last attempt to quit: 02/27/2014    Years since quitting: 4.0  . Smokeless tobacco: Never Used  . Tobacco comment: just quit since getting out of hospital 9/24 restarted smoking 9 months ago-AJ  Substance and Sexual Activity  . Alcohol use: Yes    Alcohol/week: 0.0 standard drinks    Comment: occasions only 1 glass of wine  . Drug use: No  . Sexual activity: Not on file

## 2018-08-12 ENCOUNTER — Ambulatory Visit (INDEPENDENT_AMBULATORY_CARE_PROVIDER_SITE_OTHER): Payer: Medicare Other | Admitting: Orthopaedic Surgery

## 2018-08-12 ENCOUNTER — Ambulatory Visit (INDEPENDENT_AMBULATORY_CARE_PROVIDER_SITE_OTHER): Payer: Medicare Other

## 2018-08-12 ENCOUNTER — Other Ambulatory Visit: Payer: Self-pay

## 2018-08-12 ENCOUNTER — Encounter: Payer: Self-pay | Admitting: Orthopaedic Surgery

## 2018-08-12 VITALS — BP 143/86 | HR 88 | Ht 63.0 in | Wt 196.0 lb

## 2018-08-12 DIAGNOSIS — R1032 Left lower quadrant pain: Secondary | ICD-10-CM | POA: Insufficient documentation

## 2018-08-12 DIAGNOSIS — G8929 Other chronic pain: Secondary | ICD-10-CM

## 2018-08-12 DIAGNOSIS — Z6833 Body mass index (BMI) 33.0-33.9, adult: Secondary | ICD-10-CM | POA: Insufficient documentation

## 2018-08-12 DIAGNOSIS — M25562 Pain in left knee: Secondary | ICD-10-CM | POA: Diagnosis not present

## 2018-08-12 DIAGNOSIS — E785 Hyperlipidemia, unspecified: Secondary | ICD-10-CM | POA: Insufficient documentation

## 2018-08-12 DIAGNOSIS — N3946 Mixed incontinence: Secondary | ICD-10-CM | POA: Insufficient documentation

## 2018-08-12 DIAGNOSIS — M170 Bilateral primary osteoarthritis of knee: Secondary | ICD-10-CM | POA: Diagnosis not present

## 2018-08-12 MED ORDER — BUPIVACAINE HCL 0.5 % IJ SOLN
3.0000 mL | INTRAMUSCULAR | Status: AC | PRN
  Administered 2018-08-12: 17:00:00 3 mL via INTRA_ARTICULAR

## 2018-08-12 MED ORDER — METHYLPREDNISOLONE ACETATE 40 MG/ML IJ SUSP
40.0000 mg | INTRAMUSCULAR | Status: AC | PRN
  Administered 2018-08-12: 40 mg via INTRA_ARTICULAR

## 2018-08-12 MED ORDER — LIDOCAINE HCL 1 % IJ SOLN
0.5000 mL | INTRAMUSCULAR | Status: AC | PRN
  Administered 2018-08-12: 17:00:00 .5 mL

## 2018-08-12 NOTE — Progress Notes (Signed)
Office Visit Note   Patient: Tracy Johnston           Date of Birth: [birth year]           MRN: [id number] Visit Date: 08/12/2018              Requested by: Sandi Mealy, MD [place],  [place] [id number] PCP: Sandi Mealy, MD   Assessment & Plan: Visit Diagnoses:  1. Chronic pain of left knee   2. Bilateral primary osteoarthritis of knee     Plan: Left knee injection performed.  She does not tolerate injections well.  Good relief post injection office follow-up PRN.  Follow-Up Instructions: Return if symptoms worsen or fail to improve.   Orders:  Orders Placed This Encounter  Procedures  . Large Joint Inj  . XR KNEE 3 VIEW LEFT   No orders of the defined types were placed in this encounter.     Procedures: Large Joint Inj: L knee on 08/12/2018 5:08 PM Indications: joint swelling and pain Details: 22 G 1.5 in needle, anterolateral approach  Arthrogram: No  Medications: 0.5 mL lidocaine 1 %; 3 mL bupivacaine 0.5 %; 40 mg methylPREDNISolone acetate 40 MG/ML Outcome: tolerated well, no immediate complications Procedure, treatment alternatives, risks and benefits explained, specific risks discussed. Consent was given by the patient. Immediately prior to procedure a time out was called to verify the correct patient, procedure, equipment, support staff and site/side marked as required. Patient was prepped and draped in the usual sterile fashion.       Clinical Data: No additional findings.   Subjective: Chief Complaint  Patient presents with  . Left Knee - Pain    HPI 67 year old female returns with left knee pain swelling and requesting an injection.  She is used over-the-counter anti-inflammatories without relief.  Previous total hip arthroplasty right and left with extreme difficulty postoperative rehab requiring multiple months before she could ambulate without using a walker.  She states her hips are now doing well.  She is being treated with  Opana 10 mg every 8 hours by Walden Field, PA at [facility].  Review of Systems chronic smoker with hypertension COPD chronic dyspnea, atrial fibrillation.  Previous total hip arthroplasties.  Long-term opioid use.  Stress and urge incontinence.  Hyperlipidemia.  Otherwise negative is obtains HPI.   Objective: Vital Signs: BP (!) 143/86   Pulse 88   Ht 5\' 3"  (1.6 m)   Wt 196 lb (88.9 kg)   BMI 34.72 kg/m   Physical Exam Constitutional:      Appearance: She is well-developed.  HENT:     Head: Normocephalic.     Right Ear: External ear normal.     Left Ear: External ear normal.  Eyes:     Pupils: Pupils are equal, round, and reactive to light.  Neck:     Thyroid: No thyromegaly.     Trachea: No tracheal deviation.  Cardiovascular:     Rate and Rhythm: Normal rate.  Pulmonary:     Effort: Pulmonary effort is normal.  Abdominal:     Palpations: Abdomen is soft.  Skin:    General: Skin is warm and dry.  Neurological:     Mental Status: She is alert and oriented to person, place, and time.  Psychiatric:        Behavior: Behavior normal.     Ortho Exam patient has crepitus with knee range of motion right and left.  She reaches full extension flexes to 110 degrees bilaterally.  Specialty Comments:  No specialty comments available.  Imaging: Xr Knee 3 View Left  Result Date: 08/12/2018 Standing AP both knees lateral left knee and sunrise patellar x-ray were obtained and reviewed.  This shows bilateral knee osteoarthritis with joint space narrowing medial and lateral marginal osteophytes with some sparing of the patellofemoral joint. Impression: Moderate bilateral knee osteoarthritis    PMFS History: Patient Active Problem List   Diagnosis Date Noted  . BMI 33.0-33.9,adult 08/12/2018  . HLD (hyperlipidemia) 08/12/2018  . LLQ abdominal pain 08/12/2018  . Mixed stress and urge urinary incontinence 08/12/2018  . Bilateral primary osteoarthritis of knee  08/12/2018  . History of bilateral hip arthroplasty 07/09/2017  . Long-term current use of opiate analgesic 02/18/2017  . On long term drug therapy 02/06/2017  . Chronic low back pain 02/06/2017  . Chronic pain syndrome 02/06/2017  . Pain in left knee 11/22/2015  . Incisional hernia, without obstruction or gangrene 09/09/2012  . Acute blood loss anemia 10/20/2011    Class: Acute  . COPD (chronic obstructive pulmonary disease) ([facility]) 03/03/2011  . Dyspnea 12/02/2010  . Preop cardiovascular exam 10/31/2010  . Claudication ([facility]) 10/18/2010  . Atrial fibrillation ([facility])   . Tobacco abuse   . Hypertension    Past Medical History:  Diagnosis Date  . Arthritis   . Asthma   . Atrial fibrillation (HCC)    chronic. Was first diagnosed in her early 84s.  . Cancer ([facility]) 2003ish   , cervix  . COPD (chronic obstructive pulmonary disease) ([facility])   . Heart murmur   . Hypertension   . Insomnia   . Malignant neoplasm of kidney ([facility])    rt removed 2003  . Mitral regurgitation    Mild to moderate. Normal EF 09/2010  . Renal insufficiency   . Sickle cell anemia (HCC)   . Tobacco abuse     Family History  Problem Relation Age of Onset  . Diabetes Mother   . Cancer Mother        ovarian    Past Surgical History:  Procedure Laterality Date  . arm fracture     rods, pins and bone graft.- to left arm from hip  . BONE GRAFT HIP ILIAC CREST     to left arm  . CERVICAL CONIZATION W/BX    . NEPHRECTOMY  2003  . TOTAL HIP ARTHROPLASTY  10/17/2011   Procedure: TOTAL HIP ARTHROPLASTY;  Surgeon: [name], MD;  Location: [facility];  Service: Orthopedics;  Laterality: Left;  Left Total Hip Arthroplasty  . TOTAL HIP ARTHROPLASTY  01/02/2012   Procedure: TOTAL HIP ARTHROPLASTY;  Surgeon: [name], MD;  Location: [facility];  Service: Orthopedics;  Laterality: Right;  Right Total Hip Arthroplasty  . TUBAL LIGATION     Social History   Occupational History  . Not on file  Tobacco Use  . Smoking  status: Current Some Day Smoker    Packs/day: 1.00    Years: 40.00    Pack years: 40.00    Types: Cigarettes    Start date: 10/21/1983    Last attempt to quit: 02/27/2014    Years since quitting: 4.4  . Smokeless tobacco: Never Used  . Tobacco comment: just quit since getting out of hospital 9/24 restarted smoking 9 months ago-AJ  Substance and Sexual Activity  . Alcohol use: Yes    Alcohol/week: 0.0 standard drinks    Comment: occasions only 1 glass  of wine  . Drug use: No  . Sexual activity: Not on file

## 2018-09-07 ENCOUNTER — Telehealth: Payer: Self-pay | Admitting: Cardiovascular Disease

## 2018-09-07 NOTE — Telephone Encounter (Signed)
Virtual Visit Pre-Appointment Phone Call  "(Name), I am calling you today to discuss your upcoming appointment. We are currently trying to limit exposure to the virus that causes COVID-19 by seeing patients at home rather than in the office."  1. "What is the BEST phone number to call the day of the visit?" - include this in appointment notes  2. Do you have or have access to (through a family member/friend) a smartphone with video capability that we can use for your visit?" a. If yes - list this number in appt notes as cell (if different from BEST phone #) and list the appointment type as a VIDEO visit in appointment notes b. If no - list the appointment type as a PHONE visit in appointment notes  3. Confirm consent - "In the setting of the current Covid19 crisis, you are scheduled for a (phone or video) visit with your provider on (date) at (time).  Just as we do with many in-office visits, in order for you to participate in this visit, we must obtain consent.  If you'd like, I can send this to your mychart (if signed up) or email for you to review.  Otherwise, I can obtain your verbal consent now.  All virtual visits are billed to your insurance company just like a normal visit would be.  By agreeing to a virtual visit, we'd like you to understand that the technology does not allow for your provider to perform an examination, and thus may limit your provider's ability to fully assess your condition. If your provider identifies any concerns that need to be evaluated in person, we will make arrangements to do so.  Finally, though the technology is pretty good, we cannot assure that it will always work on either your or our end, and in the setting of a video visit, we may have to convert it to a phone-only visit.  In either situation, we cannot ensure that we have a secure connection.  Are you willing to proceed?" STAFF: Did the patient verbally acknowledge consent to telehealth visit? Document  YES/NO here: yes  4. Advise patient to be prepared - "Two hours prior to your appointment, go ahead and check your blood pressure, pulse, oxygen saturation, and your weight (if you have the equipment to check those) and write them all down. When your visit starts, your provider will ask you for this information. If you have an Apple Watch or Kardia device, please plan to have heart rate information ready on the day of your appointment. Please have a pen and paper handy nearby the day of the visit as well."  5. Give patient instructions for MyChart download to smartphone OR Doximity/[email or web address] as below if video visit (depending on what platform provider is using)  6. Inform patient they will receive a phone call 15 minutes prior to their appointment time (may be from unknown caller ID) so they should be prepared to answer    TELEPHONE CALL NOTE  Tracy Johnston has been deemed a candidate for a follow-up tele-health visit to limit community exposure during the Covid-19 pandemic. I spoke with the patient via phone to ensure availability of phone/video source, confirm preferred email & phone number, and discuss instructions and expectations.  I reminded Jamisha Hoeschen to be prepared with any vital sign and/or heart rhythm information that could potentially be obtained via home monitoring, at the time of her visit. I reminded Krystina Strieter to expect a phone call prior to her visit.  Weston Anna 09/07/2018 4:48 PM   INSTRUCTIONS FOR DOWNLOADING THE MYCHART APP TO SMARTPHONE  - The patient must first make sure to have activated MyChart and know their login information - If Apple, go to [employer] and type in MyChart in the search bar and download the app. If Android, ask patient to go to [employer] and type in [place] in the search bar and download the app. The app is free but as with any other app downloads, their phone may require them to verify saved payment information or Apple/Android  password.  - The patient will need to then log into the app with their MyChart username and password, and select  as their healthcare provider to link the account. When it is time for your visit, go to the MyChart app, find appointments, and click Begin Video Visit. Be sure to Select Allow for your device to access the Microphone and Camera for your visit. You will then be connected, and your provider will be with you shortly.  **If they have any issues connecting, or need assistance please contact MyChart service desk [phone])**  **If using a computer, in order to ensure the best quality for their visit they will need to use either of the following Internet Browsers: [employer], or Google Chrome**  IF USING DOXIMITY or DOXY.ME - The patient will receive a link just prior to their visit by text.     FULL LENGTH CONSENT FOR TELE-HEALTH VISIT   I hereby voluntarily request, consent and authorize [facility] and its employed or contracted physicians, physician assistants, nurse practitioners or other licensed health care professionals (the Practitioner), to provide me with telemedicine health care services (the Services") as deemed necessary by the treating Practitioner. I acknowledge and consent to receive the Services by the Practitioner via telemedicine. I understand that the telemedicine visit will involve communicating with the Practitioner through live audiovisual communication technology and the disclosure of certain medical information by electronic transmission. I acknowledge that I have been given the opportunity to request an in-person assessment or other available alternative prior to the telemedicine visit and am voluntarily participating in the telemedicine visit.  I understand that I have the right to withhold or withdraw my consent to the use of telemedicine in the course of my care at any time, without affecting my right to future care or treatment,  and that the Practitioner or I may terminate the telemedicine visit at any time. I understand that I have the right to inspect all information obtained and/or recorded in the course of the telemedicine visit and may receive copies of available information for a reasonable fee.  I understand that some of the potential risks of receiving the Services via telemedicine include:   Delay or interruption in medical evaluation due to technological equipment failure or disruption;  Information transmitted may not be sufficient (e.g. poor resolution of images) to allow for appropriate medical decision making by the Practitioner; and/or   In rare instances, security protocols could fail, causing a breach of personal health information.  Furthermore, I acknowledge that it is my responsibility to provide information about my medical history, conditions and care that is complete and accurate to the best of my ability. I acknowledge that Practitioner's advice, recommendations, and/or decision may be based on factors not within their control, such as incomplete or inaccurate data provided by me or distortions of diagnostic images or specimens that may result from electronic transmissions. I understand that the  practice of medicine is not an Chief Strategy Officer and that Practitioner makes no warranties or guarantees regarding treatment outcomes. I acknowledge that I will receive a copy of this consent concurrently upon execution via email to the email address I last provided but may also request a printed copy by calling the office of [facility].    I understand that my insurance will be billed for this visit.   I have read or had this consent read to me.  I understand the contents of this consent, which adequately explains the benefits and risks of the Services being provided via telemedicine.   I have been provided ample opportunity to ask questions regarding this consent and the Services and have had my questions  answered to my satisfaction.  I give my informed consent for the services to be provided through the use of telemedicine in my medical care  By participating in this telemedicine visit I agree to the above.

## 2018-10-15 ENCOUNTER — Other Ambulatory Visit (INDEPENDENT_AMBULATORY_CARE_PROVIDER_SITE_OTHER): Payer: Self-pay | Admitting: Orthopaedic Surgery

## 2018-11-08 ENCOUNTER — Telehealth (INDEPENDENT_AMBULATORY_CARE_PROVIDER_SITE_OTHER): Payer: Medicare Other | Admitting: Cardiovascular Disease

## 2018-11-08 ENCOUNTER — Encounter: Payer: Self-pay | Admitting: Cardiovascular Disease

## 2018-11-08 VITALS — Ht 64.0 in | Wt 190.0 lb

## 2018-11-08 DIAGNOSIS — I340 Nonrheumatic mitral (valve) insufficiency: Secondary | ICD-10-CM

## 2018-11-08 DIAGNOSIS — I4821 Permanent atrial fibrillation: Secondary | ICD-10-CM | POA: Diagnosis not present

## 2018-11-08 DIAGNOSIS — I10 Essential (primary) hypertension: Secondary | ICD-10-CM

## 2018-11-08 DIAGNOSIS — R600 Localized edema: Secondary | ICD-10-CM

## 2018-11-08 NOTE — Addendum Note (Signed)
Addended by: Laurine Blazer on: 11/08/2018 11:54 AM   Modules accepted: Orders

## 2018-11-08 NOTE — Patient Instructions (Signed)

## 2018-11-08 NOTE — Progress Notes (Signed)
Virtual Visit via Telephone Note   This visit type was conducted due to national recommendations for restrictions regarding the COVID-19 Pandemic (e.g. social distancing) in an effort to limit this patient's exposure and mitigate transmission in our community.  Due to her co-morbid illnesses, this patient is at least at moderate risk for complications without adequate follow up.  This format is felt to be most appropriate for this patient at this time.  The patient did not have access to video technology/had technical difficulties with video requiring transitioning to audio format only (telephone).  All issues noted in this document were discussed and addressed.  No physical exam could be performed with this format.  Please refer to the patient's chart for her  consent to telehealth for [facility].   Date:  11/08/2018   ID:  Janene Harvey, DOB [birth year], MRN [id number]  Patient Location: Home Provider Location: Office  PCP:  Sandi Mealy, MD  Cardiologist:  Kate Sable, MD  Electrophysiologist:  None   Evaluation Performed:  Follow-Up Visit  Chief Complaint: Atrial fibrillation  History of Present Illness:    Tracy Johnston is a 68 y.o. female with a history of permanent atrial fibrillation, hypertension, and mitral regurgitation.  Her most recent stress test was in April 2015. Perfusion imaging showed a small anteroseptal, partially reversible defect of moderate intensity. Although ischemia could not be excluded in the distribution of the mid to distal LAD, variable soft tissue attenuation may have also been responsible. Regional wall motion was normal. Her most recent echo was in September 2012 and revealed normal LV systolic function, EF 0000000, moderate LVH, mild to moderate MR, and mild BAE.  She very seldom has chest pains which do not concern her.  She has occasional shortness of breath.  She denies palpitations and leg swelling.    Past Medical History:   Diagnosis Date  . Arthritis   . Asthma   . Atrial fibrillation (HCC)    chronic. Was first diagnosed in her early 41s.  . 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 COPD (chronic obstructive pulmonary disease) ([place])   . Heart murmur   . Hypertension   . Insomnia   . Malignant neoplasm of kidney ([place])    rt removed 2003  . Mitral regurgitation    Mild to moderate. Normal EF 09/2010  . Renal insufficiency   . Sickle cell anemia (HCC)   . Tobacco abuse    Past Surgical History:  Procedure Laterality Date  . arm fracture     rods, pins and bone graft.- to left arm from hip  . BONE GRAFT HIP ILIAC CREST     to left arm  . CERVICAL CONIZATION W/BX    . NEPHRECTOMY  2003  . TOTAL HIP ARTHROPLASTY  10/17/2011   Procedure: TOTAL HIP ARTHROPLASTY;  Surgeon: [name], MD;  Location: [place];  Service: Orthopedics;  Laterality: Left;  Left Total Hip Arthroplasty  . TOTAL HIP ARTHROPLASTY  01/02/2012   Procedure: TOTAL HIP ARTHROPLASTY;  Surgeon: [name], MD;  Location: [place];  Service: Orthopedics;  Laterality: Right;  Right Total Hip Arthroplasty  . TUBAL LIGATION       Current Meds  Medication Sig  . albuterol (ACCUNEB) 0.63 MG/3ML nebulizer solution Take 1 ampule by nebulization every 6 (six) hours as needed. For shortness of breath  . allopurinol (ZYLOPRIM) 300 MG tablet TAKE 1 TABLET BY MOUTH DAILY.  Marland Kitchen Cholecalciferol (VITAMIN D) 2000 units CAPS  Take 1 capsule by mouth daily.  Marland Kitchen diltiazem (CARDIZEM CD) 240 MG 24 hr capsule Take 1 capsule (240 mg total) by mouth daily.  Marland Kitchen docusate sodium (COLACE) 100 MG capsule Take 100 mg by mouth 2 (two) times daily.   . Fluticasone-Umeclidin-Vilant (TRELEGY ELLIPTA) 100-62.5-25 MCG/INH AEPB Inhale 1 puff into the lungs daily.  Marland Kitchen ibuprofen (ADVIL) 800 MG tablet TAKE 1 TABLET BY MOUTH TWICE DAILY AS NEEDED WITH FOOD  . Magnesium 500 MG CAPS Take 500 mg by mouth daily.   . montelukast (SINGULAIR) 10 MG tablet Take 10 mg by mouth at bedtime.  Marland Kitchen  MYRBETRIQ 50 MG TB24 tablet Take 1 tablet by mouth daily.  Marland Kitchen olmesartan-hydrochlorothiazide (BENICAR HCT) 40-12.5 MG per tablet Take 1 tablet by mouth daily.    Marland Kitchen oxymorphone (OPANA) 10 MG tablet Take 10 mg by mouth every 8 (eight) hours as needed.   . predniSONE (DELTASONE) 20 MG tablet Take 2 tablets by mouth daily as needed.  Marland Kitchen PROAIR HFA 108 (90 Base) MCG/ACT inhaler as needed.   . simvastatin (ZOCOR) 20 MG tablet Take 20 mg by mouth daily.  . vitamin C (ASCORBIC ACID) 500 MG tablet Take 500 mg by mouth daily.  Alveda Reasons 20 MG TABS tablet TAKE 1 TABLET BY MOUTH DAILY     Allergies:   Bupropion, Aspirin, and Latex   Social History   Tobacco Use  . Smoking status: Former Smoker    Packs/day: 1.00    Years: 40.00    Pack years: 40.00    Types: Cigarettes    Start date: 10/21/1983    Quit date: 10/20/2017    Years since quitting: 1.0  . Smokeless tobacco: Never Used  . Tobacco comment: just quit since getting out of hospital 9/24 restarted smoking 9 months ago-AJ  Substance Use Topics  . Alcohol use: Yes    Alcohol/week: 0.0 standard drinks    Comment: occasions only 1 glass of wine  . Drug use: No     Family Hx: The patient's family history includes Cancer in her mother; Diabetes in her mother.  ROS:   Please see the history of present illness.     All other systems reviewed and are negative.   Prior CV studies:   The following studies were reviewed today:  Reviewed above  Labs/Other Tests and Data Reviewed:    EKG:  No ECG reviewed.  Recent Labs: No results found for requested labs within last 8760 hours.   Recent Lipid Panel No results found for: CHOL, TRIG, HDL, CHOLHDL, LDLCALC, LDLDIRECT  Wt Readings from Last 3 Encounters:  11/08/18 190 lb (86.2 kg)  08/12/18 196 lb (88.9 kg)  03/18/18 196 lb (88.9 kg)     Objective:    Vital Signs:  Ht 5\' 4"  (1.626 m)   Wt 190 lb (86.2 kg)   BMI 32.61 kg/m    VITAL SIGNS:  reviewed  ASSESSMENT & PLAN:     1.  Permanent atrial fibrillation: Symptomatically stable. Rate-controlled on diltiazem and on Xarelto for anticoagulation. No changes.  2. Essential [id number] elevated. No changes.  3. Mitral regurgitation: I will obtain a follow-up echocardiogram.   4.  Bilateral leg swelling: This is likely due to venous insufficiency.  I previously recommended keeping legs elevated and if further issues arise, compression stockings could be considered.   COVID-19 Education: The signs and symptoms of COVID-19 were discussed with the patient and how to seek care for testing (follow up with PCP or arrange  E-visit).  The importance of social distancing was discussed today.  Time:   Today, I have spent 10 minutes with the patient with telehealth technology discussing the above problems.     Medication Adjustments/Labs and Tests Ordered: Current medicines are reviewed at length with the patient today.  Concerns regarding medicines are outlined above.   Tests Ordered: No orders of the defined types were placed in this encounter.   Medication Changes: No orders of the defined types were placed in this encounter.   Follow Up:  Either In Person or Virtual Visit in 1 year(s)  Signed, Kate Sable, MD  11/08/2018 11:27 AM    [place]

## 2018-12-08 ENCOUNTER — Other Ambulatory Visit: Payer: Self-pay

## 2018-12-08 ENCOUNTER — Ambulatory Visit (INDEPENDENT_AMBULATORY_CARE_PROVIDER_SITE_OTHER): Payer: Medicare Other

## 2018-12-08 DIAGNOSIS — I340 Nonrheumatic mitral (valve) insufficiency: Secondary | ICD-10-CM | POA: Diagnosis not present

## 2018-12-14 ENCOUNTER — Telehealth: Payer: Self-pay | Admitting: *Deleted

## 2018-12-14 DIAGNOSIS — I351 Nonrheumatic aortic (valve) insufficiency: Secondary | ICD-10-CM

## 2018-12-14 DIAGNOSIS — I340 Nonrheumatic mitral (valve) insufficiency: Secondary | ICD-10-CM

## 2018-12-14 NOTE — Telephone Encounter (Signed)
Notes recorded by Laurine Blazer, LPN on [id number] at 5:24 PM EST  Patient notified. Copy to pmd.  ------   Notes recorded by Herminio Commons, MD on 12/08/2018 at 4:45 PM EST  Pumping function is normal. She has significant mitral and aortic valve regurgitation. She does not appear to be symptomatic from the standpoint. I would recommend a follow-up echocardiogram in 6 months.

## 2018-12-31 ENCOUNTER — Other Ambulatory Visit (INDEPENDENT_AMBULATORY_CARE_PROVIDER_SITE_OTHER): Payer: Self-pay | Admitting: Orthopaedic Surgery

## 2018-12-31 NOTE — Telephone Encounter (Signed)
Please advise 

## 2018-12-31 NOTE — Telephone Encounter (Signed)
Call her first make sure she is not on eliquis again. Had atrial fib and has boon on it in the past. If she is off all blood thinner OK to send in as requested thanks

## 2019-01-03 NOTE — Telephone Encounter (Signed)
Sorry , no , she will have to get it from her LMD due to risk of bleeding even though she has been OK and not bleeding so far . Thanks

## 2019-01-03 NOTE — Telephone Encounter (Signed)
Patient is not taking Eliquis but is taking Xarelto. She states that she has been taking the ibuprofen with the Xarelto and doing fine.  Would you like for me to approve refill?

## 2019-01-31 ENCOUNTER — Other Ambulatory Visit: Payer: Self-pay

## 2019-01-31 MED ORDER — RIVAROXABAN 20 MG PO TABS: 20.0000 mg | ORAL_TABLET | Freq: Every day | ORAL | 3 refills | Status: DC

## 2019-01-31 NOTE — Telephone Encounter (Signed)
Refilled Xarelto. 

## 2019-06-09 ENCOUNTER — Other Ambulatory Visit: Payer: Self-pay

## 2019-06-09 ENCOUNTER — Ambulatory Visit (INDEPENDENT_AMBULATORY_CARE_PROVIDER_SITE_OTHER): Payer: Medicare Other | Admitting: Orthopaedic Surgery

## 2019-06-09 VITALS — BP 133/83 | HR 72 | Ht 63.0 in | Wt 206.0 lb

## 2019-06-09 DIAGNOSIS — M1712 Unilateral primary osteoarthritis, left knee: Secondary | ICD-10-CM

## 2019-06-09 MED ORDER — METHYLPREDNISOLONE ACETATE 40 MG/ML IJ SUSP
40.0000 mg | INTRAMUSCULAR | Status: AC | PRN
  Administered 2019-06-09: 40 mg via INTRA_ARTICULAR

## 2019-06-09 MED ORDER — LIDOCAINE HCL 1 % IJ SOLN
0.5000 mL | INTRAMUSCULAR | Status: AC | PRN
  Administered 2019-06-09: .5 mL

## 2019-06-09 MED ORDER — BUPIVACAINE HCL 0.5 % IJ SOLN
3.0000 mL | INTRAMUSCULAR | Status: AC | PRN
  Administered 2019-06-09: 3 mL via INTRA_ARTICULAR

## 2019-06-09 NOTE — Progress Notes (Signed)
Office Visit Note   Patient: Tracy Johnston           Date of Birth: 15-Jun-1950           MRN: [id number] Visit Date: 06/09/2019              Requested by: Leeanne Rio, MD [place],  [place] [address] PCP: Leeanne Rio, MD   Assessment & Plan: Visit Diagnoses: No diagnosis found.  Plan: Knee injection performed.  Patient tolerates pain very poorly and again screams and alternates with injection.  She had great difficulty handling pain after total hip arthroplasty and now is on chronic narcotic medication.  Knee injection performed hopefully this will give her many months of relief return as needed. Follow-Up Instructions: No follow-ups on file.   Orders:  No orders of the defined types were placed in this encounter.  No orders of the defined types were placed in this encounter.     Procedures: Large Joint Inj: L knee on 06/09/2019 9:32 AM Indications: joint swelling and pain Details: 22 G 1.5 in needle, anterolateral approach  Arthrogram: No  Medications: 0.5 mL lidocaine 1 %; 3 mL bupivacaine 0.5 %; 40 mg methylPREDNISolone acetate 40 MG/ML Outcome: tolerated well, no immediate complications Procedure, treatment alternatives, risks and benefits explained, specific risks discussed. Consent was given by the patient. Immediately prior to procedure a time out was called to verify the correct patient, procedure, equipment, support staff and site/side marked as required. Patient was prepped and draped in the usual sterile fashion.       Clinical Data: No additional findings.   Subjective: No chief complaint on file.   HPI 69-year-old female returns with recurrent problems with synovitis with left knee osteoarthritis.  She has bilateral knee arthritis has had previous total hip arthroplasties which both gave her considerable problems rehabilitating.  She is on long-term opioid use.  Problems with obesity atrial fib COPD chronic shortness of breath.   Patient is requesting a left knee injection today for increased pain and difficulty walking.  Review of Systems view of systems updated unchanged from 08/12/2018 office visit.  Of note is chronic shortness of breath.  Problems with stress incontinence and told him arthroplasties.   Objective: Vital Signs: There were no vitals taken for this visit.  Physical Exam Constitutional:      Appearance: She is well-developed.  HENT:     Head: Normocephalic.     Right Ear: External ear normal.     Left Ear: External ear normal.  Eyes:     Pupils: Pupils are equal, round, and reactive to light.  Neck:     Thyroid: No thyromegaly.     Trachea: No tracheal deviation.  Cardiovascular:     Rate and Rhythm: Normal rate.  Pulmonary:     Effort: Pulmonary effort is normal.  Abdominal:     Palpations: Abdomen is soft.  Skin:    General: Skin is warm and dry.  Neurological:     Mental Status: She is alert and oriented to person, place, and time.  Psychiatric:        Behavior: Behavior normal.     Ortho Exam bilateral knee crepitus.  She comes full extension she ambulates with a cane.  She has some quad weakness bilaterally.  No pitting edema negative Homans distal pulses are 2+ sensation of foot is intact.  Specialty Comments:  No specialty comments available.  Imaging: No results found.   [place]  History: Patient Active Problem List   Diagnosis Date Noted  . BMI 33.0-33.9,adult 08/12/2018  . HLD (hyperlipidemia) 08/12/2018  . LLQ abdominal pain 08/12/2018  . Mixed stress and urge urinary incontinence 08/12/2018  . Bilateral primary osteoarthritis of knee 08/12/2018  . History of bilateral hip arthroplasty 07/09/2017  . Long-term current use of opiate analgesic 02/18/2017  . On long term drug therapy 02/06/2017  . Chronic low back pain 02/06/2017  . Chronic pain syndrome 02/06/2017  . Pain in left knee 11/22/2015  . Incisional hernia, without obstruction or gangrene 09/09/2012  .  Acute blood loss anemia 10/20/2011    Class: Acute  . COPD (chronic obstructive pulmonary disease) ([place]) 03/03/2011  . Dyspnea 12/02/2010  . Preop cardiovascular exam 10/31/2010  . Claudication ([place]) 10/18/2010  . Atrial fibrillation ([place])   . Tobacco abuse   . Hypertension    Past Medical History:  Diagnosis Date  . Arthritis   . Asthma   . Atrial fibrillation (HCC)    chronic. Was first diagnosed in her early 31s.  . Cancer ([place]) 2003ish   , cervix  . COPD (chronic obstructive pulmonary disease) ([place])   . Heart murmur   . Hypertension   . Insomnia   . Malignant neoplasm of kidney ([place])    rt removed 2003  . Mitral regurgitation    Mild to moderate. Normal EF 09/2010  . Renal insufficiency   . Sickle cell anemia (HCC)   . Tobacco abuse     Family History  Problem Relation Age of Onset  . Diabetes Mother   . Cancer Mother        ovarian    Past Surgical History:  Procedure Laterality Date  . arm fracture     rods, pins and bone graft.- to left arm from hip  . BONE GRAFT HIP ILIAC CREST     to left arm  . CERVICAL CONIZATION W/BX    . NEPHRECTOMY  2003  . TOTAL HIP ARTHROPLASTY  10/17/2011   Procedure: TOTAL HIP ARTHROPLASTY;  Surgeon: [name], MD;  Location: [place] [facility];  Service: Orthopedics;  Laterality: Left;  Left Total Hip Arthroplasty  . TOTAL HIP ARTHROPLASTY  01/02/2012   Procedure: TOTAL HIP ARTHROPLASTY;  Surgeon: [name], MD;  Location: [facility];  Service: Orthopedics;  Laterality: Right;  Right Total Hip Arthroplasty  . TUBAL LIGATION     Social History   Occupational History  . Not on file  Tobacco Use  . Smoking status: Former Smoker    Packs/day: 1.00    Years: 40.00    Pack years: 40.00    Types: Cigarettes    Start date: 10/21/1983    Quit date: 10/20/2017    Years since quitting: 1.6  . Smokeless tobacco: Never Used  . Tobacco comment: just quit since getting out of hospital 9/24 restarted smoking 9 months ago-AJ  Substance and Sexual  Activity  . Alcohol use: Yes    Alcohol/week: 0.0 standard drinks    Comment: occasions only 1 glass of wine  . Drug use: No  . Sexual activity: Not on file

## 2019-06-20 ENCOUNTER — Telehealth: Payer: Self-pay | Admitting: Cardiovascular Disease

## 2019-06-20 NOTE — Telephone Encounter (Signed)
error 

## 2019-06-23 ENCOUNTER — Other Ambulatory Visit: Payer: Self-pay

## 2019-06-23 ENCOUNTER — Ambulatory Visit (INDEPENDENT_AMBULATORY_CARE_PROVIDER_SITE_OTHER): Payer: Medicare Other

## 2019-06-23 DIAGNOSIS — I351 Nonrheumatic aortic (valve) insufficiency: Secondary | ICD-10-CM

## 2019-06-23 DIAGNOSIS — I340 Nonrheumatic mitral (valve) insufficiency: Secondary | ICD-10-CM | POA: Diagnosis not present

## 2019-06-29 ENCOUNTER — Telehealth: Payer: Self-pay | Admitting: *Deleted

## 2019-06-29 NOTE — Telephone Encounter (Signed)
Tracy Johnston, [place]  [id number] AM EDT    Notified, copy to pcp.

## 2019-06-29 NOTE — Telephone Encounter (Signed)
-----   Message from Bernita Raisin, RN sent at 06/24/2019  9:21 AM EDT -----  ----- Message ----- From: Herminio Commons, MD Sent: 06/24/2019   8:39 AM EDT To: Bernita Raisin, RN  Normal pumping function.  Moderate mitral and aortic valve leakage.  Aortic valve demonstrates slightly less leakage when compared echocardiogram in November 2020.

## 2019-07-29 ENCOUNTER — Telehealth: Payer: Self-pay | Admitting: Radiology

## 2019-07-29 NOTE — Telephone Encounter (Signed)
Received fax from [facility] requesting refill on Ibuprofen 800mg  tablet, 1 po bid prn with food #60. Last filled 10/15/2018.  OK to refill?

## 2019-08-02 ENCOUNTER — Telehealth: Payer: Self-pay

## 2019-08-02 NOTE — Telephone Encounter (Signed)
Pt aware that we would not refill 800mg  Motrin as this is non cardiac medication and to contact pcp regarding what other meds she could take for arthritis

## 2019-08-02 NOTE — Telephone Encounter (Signed)
noted 

## 2019-08-02 NOTE — Telephone Encounter (Signed)
Patient called stating that Dr. Lorin Mercy told her to stop the Motrin 800mg . Was told to contact our office in regards to having this medication refilled.

## 2019-08-02 NOTE — Telephone Encounter (Signed)
Denied, I called her, is on xarelto and she will have to talk with her PCP about tx.

## 2019-12-18 NOTE — Progress Notes (Signed)
Cardiology Office Note  Date: 12/19/2019   ID: Tracy Johnston, DOB [birth year], MRN [id number]  PCP:  Leeanne Rio, MD  Cardiologist:  Kate Sable, MD (Inactive) Electrophysiologist:  None   Chief Complaint: Permanent atrial fibrillation.   History of Present Illness: Tracy Johnston is a 69 y.o. female with a history of permanent atrial fibrillation, HTN, mitral valve insufficiency, bilateral lower extremity edema, COPD, mitral regurgitation, tobacco abuse, renal CA, sickle cell anemia.  Last encounter with Dr. Bronson Ing 11/08/2018 via telemedicine visit. Reprted she very seldom had chest pains which were not bothersome.  She denied any palpitations or leg swelling.  Her atrial fibrillation was symptomatically stable.  Rate was controlled on diltiazem and continuing Xarelto.  Blood pressure was mildly elevated.  There were no changes to medications.  Echocardiogram was ordered to further evaluate mitral regurgitation.  Bilateral leg swelling was likely due to venous insufficiency.  Advised to keep legs elevated and could use compression stockings.  She is here today for 1 year follow-up.  She denies any recent acute illnesses or hospitalizations.  She has chronic shortness of breath due to history of COPD/tobacco use.  She states she is supposed to be on 2 L nasal cannula at home but currently not using.  States she has problems sleeping at night.  She denies any anginal symptoms, significant palpitations or arrhythmias, orthostatic symptoms, CVA or TIA-like symptoms.  She admits to some orthopnea and has to sleep elevated on pillows.  Denies any PND.  Denies any bleeding issues, claudication-like symptoms, DVT or PE-like symptoms, lower extremity edema.  States she had lab work earlier in the year with PCP and everything was normal.  EKG today shows atrial fibrillation with a rate of 79.  She has a loose sounding cough today.  She states she did not take her nebulizer treatment this  morning   Past Medical History:  Diagnosis Date  . Arthritis   . Asthma   . Atrial fibrillation (HCC)    chronic. Was first diagnosed in her early 58s.  . 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 COPD (chronic obstructive pulmonary disease) ([place])   . Heart murmur   . Hypertension   . Insomnia   . Malignant neoplasm of kidney ([facility])    rt removed 2003  . Mitral regurgitation    Mild to moderate. Normal EF 09/2010  . Renal insufficiency   . Sickle cell anemia (HCC)   . Tobacco abuse     Past Surgical History:  Procedure Laterality Date  . arm fracture     rods, pins and bone graft.- to left arm from hip  . BONE GRAFT HIP ILIAC CREST     to left arm  . CERVICAL CONIZATION W/BX    . NEPHRECTOMY  2003  . TOTAL HIP ARTHROPLASTY  10/17/2011   Procedure: TOTAL HIP ARTHROPLASTY;  Surgeon: [name], MD;  Location: [facility];  Service: Orthopedics;  Laterality: Left;  Left Total Hip Arthroplasty  . TOTAL HIP ARTHROPLASTY  01/02/2012   Procedure: TOTAL HIP ARTHROPLASTY;  Surgeon: [name], MD;  Location: [facility];  Service: Orthopedics;  Laterality: Right;  Right Total Hip Arthroplasty  . TUBAL LIGATION      Current Outpatient Medications  Medication Sig Dispense Refill  . albuterol (ACCUNEB) 0.63 MG/3ML nebulizer solution Take 1 ampule by nebulization every 6 (six) hours as needed. For shortness of breath    . allopurinol (ZYLOPRIM) 300 MG tablet TAKE 1 TABLET  BY MOUTH DAILY. 30 tablet 5  . Cholecalciferol (VITAMIN D) 2000 units CAPS Take 1 capsule by mouth daily. Takes occasionally.    Marland Kitchen diltiazem (CARDIZEM CD) 240 MG 24 hr capsule Take 1 capsule (240 mg total) by mouth daily. 30 capsule 6  . Fluticasone-Umeclidin-Vilant (TRELEGY ELLIPTA) 100-62.5-25 MCG/INH AEPB Inhale 1 puff into the lungs daily.    Marland Kitchen ibuprofen (ADVIL) 800 MG tablet TAKE 1 TABLET BY MOUTH TWICE DAILY AS NEEDED WITH FOOD 60 tablet 0  . MYRBETRIQ 50 MG TB24 tablet Take 1 tablet by mouth daily.    Marland Kitchen  olmesartan-hydrochlorothiazide (BENICAR HCT) 40-12.5 MG per tablet Take 1 tablet by mouth daily.      Marland Kitchen oxymorphone (OPANA) 10 MG tablet Take 10 mg by mouth every 8 (eight) hours as needed.     . Polyethylene Glycol 3350 (MIRALAX PO) Take by mouth every other day.    . predniSONE (DELTASONE) 20 MG tablet Take 2 tablets by mouth daily as needed.  0  . PROAIR HFA 108 (90 Base) MCG/ACT inhaler as needed.     . rivaroxaban (XARELTO) 20 MG TABS tablet Take 1 tablet (20 mg total) by mouth daily. 90 tablet 3  . simvastatin (ZOCOR) 20 MG tablet Take 20 mg by mouth daily.    . vitamin C (ASCORBIC ACID) 500 MG tablet Take 500 mg by mouth daily. Takes occasionally     No current facility-administered medications for this visit.   Allergies:  Bupropion, Aspirin, and Latex   Social History: The patient  reports that she quit smoking about 2 years ago. Her smoking use included cigarettes. She started smoking about 36 years ago. She has a 40.00 pack-year smoking history. She has never used smokeless tobacco. She reports current alcohol use. She reports that she does not use drugs.   Family History: The patient's family history includes Cancer in her mother; Diabetes in her mother.   ROS:  Please see the history of present illness. Otherwise, complete review of systems is positive for none.  All other systems are reviewed and negative.   Physical Exam: VS:  BP 124/80   Pulse 91   Ht 5\' 3"  (1.6 m)   Wt 208 lb (94.3 kg)   SpO2 94%   BMI 36.85 kg/m , BMI Body mass index is 36.85 kg/m.  Wt Readings from Last 3 Encounters:  12/19/19 208 lb (94.3 kg)  06/09/19 206 lb (93.4 kg)  11/08/18 190 lb (86.2 kg)    General: Patient appears comfortable at rest. Neck: Supple, no elevated JVP or carotid bruits, no thyromegaly. Lungs: Clear to auscultation, nonlabored breathing at rest. Cardiac: Regular rate and rhythm, no S3 or mild systolic murmur heard at the left and right upper sternal borders., no  pericardial rub. Extremities: No pitting edema, distal pulses 2+. Skin: Warm and dry. Musculoskeletal: No kyphosis. Neuropsychiatric: Alert and oriented x3, affect grossly appropriate.  ECG:  An ECG dated 12/19/2019 was personally reviewed today and demonstrated:  Atrial fibrillation with a competing junctional pacemaker.  ST and T wave abnormalities, consider inferolateral ischemia, rate of 79  Recent Labwork: No results found for requested labs within last 8760 hours.  No results found for: CHOL, TRIG, HDL, CHOLHDL, VLDL, LDLCALC, LDLDIRECT  Other Studies Reviewed Today:  NST in April 2015. Perfusion imaging showed a small anteroseptal, partially reversible defect of moderate intensity. Although ischemia could not be excluded in the distribution of the mid to distal LAD, variable soft tissue attenuation may have also been  responsible. Regional wall motion was normal.   Echocardiogram 06/23/2019. 1. Left ventricular ejection fraction, by estimation, is 55 to 60%. The left ventricle has normal function. The left ventricle has no regional wall motion abnormalities. Left ventricular diastolic parameters were normal. 2. Right ventricular systolic function is normal. The right ventricular size is normal. There is normal pulmonary artery systolic pressure. 3. Left atrial size was severely dilated. 4. Right atrial size was mildly dilated. 5. The mitral valve is normal in structure. Moderate mitral valve regurgitation. No evidence of mitral stenosis. 6. The aortic valve is tricuspid. Aortic valve regurgitation is moderate. No aortic stenosis is present. 7. The inferior vena cava is normal in size with greater than 50% respiratory variability, suggesting right atrial pressure of 3 mmHg. Comparison(s): Echocardiogram done 12/08/18 showed an EF of 55-60% with moderate to severe AI.  Assessment and Plan:  1. Permanent atrial fibrillation ([facility] [facility])   2. Essential hypertension   3. Moderate aortic  insufficiency   4. Mitral valve insufficiency, unspecified etiology   5. Bilateral lower extremity edema    1. Permanent atrial fibrillation (HCC) EKG today shows atrial fibrillation with a competing junctional pacemaker rate of 79, ST and T wave abnormalities consider inferior lateral ischemia.  Continue diltiazem 240 mg daily, continue Xarelto 20 mg p.o. daily.  2. Essential hypertension Blood pressure is well controlled on current therapy at 124/80.  Continue diltiazem 240 mg p.o. daily. 3. Moderate aortic insufficiency Recent echo 06/23/2019 demonstrated moderate aortic regurgitation.  No aortic stenosis.  4. Mitral valve insufficiency, unspecified etiology Recent echocardiogram 06/23/2019 demonstrated moderate MR.  No evidence of mitral stenosis.   5. Bilateral lower extremity edema Has some mild nonpitting edema.   6.  Hyperlipidemia. Continue simvastatin 20 mg p.o. daily.  Will attempt to obtain recent lab results from PCP.  Medication Adjustments/Labs and Tests Ordered: Current medicines are reviewed at length with the patient today.  Concerns regarding medicines are outlined above.   Disposition: Follow-up with Dr. Harl Bowie or APP 1 year  Signed, Levell July, NP 12/19/2019 8:22 AM    [facility] [place], [place], [place] [address] Phone: [phone]; Fax: [phone]

## 2019-12-19 ENCOUNTER — Encounter: Payer: Self-pay | Admitting: Family Medicine

## 2019-12-19 ENCOUNTER — Ambulatory Visit (INDEPENDENT_AMBULATORY_CARE_PROVIDER_SITE_OTHER): Payer: Medicare HMO | Admitting: Family Medicine

## 2019-12-19 ENCOUNTER — Encounter: Payer: Self-pay | Admitting: *Deleted

## 2019-12-19 VITALS — BP 124/80 | HR 91 | Ht 63.0 in | Wt 208.0 lb

## 2019-12-19 DIAGNOSIS — R600 Localized edema: Secondary | ICD-10-CM

## 2019-12-19 DIAGNOSIS — I351 Nonrheumatic aortic (valve) insufficiency: Secondary | ICD-10-CM

## 2019-12-19 DIAGNOSIS — I4821 Permanent atrial fibrillation: Secondary | ICD-10-CM

## 2019-12-19 DIAGNOSIS — I10 Essential (primary) hypertension: Secondary | ICD-10-CM

## 2019-12-19 DIAGNOSIS — I340 Nonrheumatic mitral (valve) insufficiency: Secondary | ICD-10-CM

## 2019-12-19 NOTE — Addendum Note (Signed)
Addended by: Laurine Blazer on: 12/19/2019 09:24 AM   Modules accepted: Orders

## 2019-12-19 NOTE — Patient Instructions (Addendum)
Medication Instructions:  Continue all current medications.  Labwork: none  Testing/Procedures: Your physician has requested that you have an echocardiogram. Echocardiography is a painless test that uses sound waves to create images of your heart. It provides your doctor with information about the size and shape of your heart and how well your heart's chambers and valves are working. This procedure takes approximately one hour. There are no restrictions for this procedure -  DUE JUST PRIOR TO NEXT OFFICE VISIT   Follow-Up: Your physician wants you to follow up in:  1 year.  You will receive a reminder letter in the mail one-two months in advance.  If you don't receive a letter, please call our office to schedule the follow up appointment.    Any Other Special Instructions Will Be Listed Below (If Applicable).  If you need a refill on your cardiac medications before your next appointment, please call your pharmacy.

## 2019-12-19 NOTE — Addendum Note (Signed)
Addended by: Laurine Blazer on: 12/19/2019 09:38 AM   Modules accepted: Orders

## 2020-02-02 ENCOUNTER — Other Ambulatory Visit: Payer: Self-pay

## 2020-02-02 MED ORDER — RIVAROXABAN 20 MG PO TABS: 20.0000 mg | ORAL_TABLET | Freq: Every day | ORAL | 3 refills | Status: DC

## 2020-02-02 NOTE — Telephone Encounter (Signed)
Refilled medication to [facility] pharmacy as requested.  Xarelto 20 mg tablet

## 2020-02-23 ENCOUNTER — Ambulatory Visit (INDEPENDENT_AMBULATORY_CARE_PROVIDER_SITE_OTHER): Payer: Medicare HMO | Admitting: Orthopaedic Surgery

## 2020-02-23 ENCOUNTER — Ambulatory Visit (INDEPENDENT_AMBULATORY_CARE_PROVIDER_SITE_OTHER): Payer: Medicare HMO

## 2020-02-23 ENCOUNTER — Encounter: Payer: Self-pay | Admitting: Orthopaedic Surgery

## 2020-02-23 ENCOUNTER — Other Ambulatory Visit: Payer: Self-pay

## 2020-02-23 VITALS — Ht 64.0 in | Wt 202.0 lb

## 2020-02-23 DIAGNOSIS — M545 Low back pain, unspecified: Secondary | ICD-10-CM

## 2020-02-23 NOTE — Progress Notes (Signed)
Office Visit Note   Patient: Tracy Johnston           Date of Birth: 04/11/1950           MRN: [id number] Visit Date: 02/23/2020              Requested by: Leeanne Rio, MD [place],  [place] [id number] PCP: Leeanne Rio, MD   Assessment & Plan: Visit Diagnoses:  1. Acute left-sided low back pain, unspecified whether sciatica present     Plan: Patient had some foraminal narrowing on the left on previous CT scan of the abdomen done more than 5 years ago.  She likely has a disc bulge with some radicular symptoms.  We will set up for some therapy for evaluation and treatment of this.  Recheck 5 weeks.  Follow-Up Instructions: No follow-ups on file.   Orders:  Orders Placed This Encounter  Procedures  . XR Pelvis 1-2 Views  . XR Lumbar Spine 2-3 Views   No orders of the defined types were placed in this encounter.     Procedures: No procedures performed   Clinical Data: No additional findings.   Subjective: Chief Complaint  Patient presents with  . Left Leg - Pain    HPI 70 year old female is seen with back pain left buttocks pain left leg pain that radiates down to her ankle.  This is been present for almost 2 weeks she states is like a cramp she is having trouble walking.  She has tried massaging it.  She is on chronic Opana from Dr. Herma Mering.  She states she is refused all recommendations for epidurals.  She has been amatory with a cane and is having difficulty walking with a short stride gait.  Previous total hip arthroplasties by me in 2013 right and left.  Posterior approach 10 for both hips no instability problems.  She denies fever chills no bowel bladder symptoms.  Review of Systems positive chronic pain management, COPD still smoking.  Positive hypertension.  14 point systems otherwise negative.   Objective: Vital Signs: Ht 5\' 4"  (1.626 m)   Wt 202 lb (91.6 kg)   BMI 34.67 kg/m   Physical Exam Constitutional:      Appearance: She is  well-developed.  HENT:     Head: Normocephalic.     Right Ear: External ear normal.     Left Ear: External ear normal.  Eyes:     Pupils: Pupils are equal, round, and reactive to light.  Neck:     Thyroid: No thyromegaly.     Trachea: No tracheal deviation.  Cardiovascular:     Rate and Rhythm: Normal rate.  Pulmonary:     Breath sounds: Wheezing and rales present.     Comments: Patient has mild labored respirations audible wheezing some rales. Abdominal:     Palpations: Abdomen is soft.  Skin:    General: Skin is warm and dry.  Neurological:     Mental Status: She is alert and oriented to person, place, and time.  Psychiatric:        Mood and Affect: Mood and affect normal.        Behavior: Behavior normal.     Ortho Exam patient has sciatic notch tenderness worse on the left than right bilateral trochanteric tenderness worse on the left than right.  Negative logroll of the hips.  Knees reach full extension with minimal crepitus.  No knee effusion distal pulses are intact.  Specialty  Comments:  No specialty comments available.  Imaging: No results found.   PMFS History: Patient Active Problem List   Diagnosis Date Noted  . BMI 33.0-33.9,adult 08/12/2018  . HLD (hyperlipidemia) 08/12/2018  . LLQ abdominal pain 08/12/2018  . Mixed stress and urge urinary incontinence 08/12/2018  . Bilateral primary osteoarthritis of knee 08/12/2018  . History of bilateral hip arthroplasty 07/09/2017  . Long-term current use of opiate analgesic 02/18/2017  . On long term drug therapy 02/06/2017  . Chronic low back pain 02/06/2017  . Chronic pain syndrome 02/06/2017  . Pain in left knee 11/22/2015  . Incisional hernia, without obstruction or gangrene 09/09/2012  . Acute blood loss anemia 10/20/2011    Class: Acute  . COPD (chronic obstructive pulmonary disease) ([facility]) 03/03/2011  . Dyspnea 12/02/2010  . Preop cardiovascular exam 10/31/2010  . Claudication ([facility]) 10/18/2010  .  Atrial fibrillation ([facility])   . Tobacco abuse   . Hypertension    Past Medical History:  Diagnosis Date  . Arthritis   . Asthma   . Atrial fibrillation (HCC)    chronic. Was first diagnosed in her early 42s.  . Cancer ([facility]) 2003ish   , cervix  . COPD (chronic obstructive pulmonary disease) ([facility])   . Heart murmur   . Hypertension   . Insomnia   . Malignant neoplasm of kidney ([facility])    rt removed 2003  . Mitral regurgitation    Mild to moderate. Normal EF 09/2010  . Renal insufficiency   . Sickle cell anemia (HCC)   . Tobacco abuse     Family History  Problem Relation Age of Onset  . Diabetes Mother   . Cancer Mother        ovarian    Past Surgical History:  Procedure Laterality Date  . arm fracture     rods, pins and bone graft.- to left arm from hip  . BONE GRAFT HIP ILIAC CREST     to left arm  . CERVICAL CONIZATION W/BX    . NEPHRECTOMY  2003  . TOTAL HIP ARTHROPLASTY  10/17/2011   Procedure: TOTAL HIP ARTHROPLASTY;  Surgeon: [name], MD;  Location: [facility];  Service: Orthopedics;  Laterality: Left;  Left Total Hip Arthroplasty  . TOTAL HIP ARTHROPLASTY  01/02/2012   Procedure: TOTAL HIP ARTHROPLASTY;  Surgeon: [name], MD;  Location: [facility];  Service: Orthopedics;  Laterality: Right;  Right Total Hip Arthroplasty  . TUBAL LIGATION     Social History   Occupational History  . Not on file  Tobacco Use  . Smoking status: Former Smoker    Packs/day: 1.00    Years: 40.00    Pack years: 40.00    Types: Cigarettes    Start date: 10/21/1983    Quit date: 10/20/2017    Years since quitting: 2.3  . Smokeless tobacco: Never Used  . Tobacco comment: just quit since getting out of hospital 9/24 restarted smoking 9 months ago-AJ  Substance and Sexual Activity  . Alcohol use: Yes    Alcohol/week: 0.0 standard drinks    Comment: occasions only 1 glass of wine  . Drug use: No  . Sexual activity: Not on file

## 2020-04-05 ENCOUNTER — Ambulatory Visit (INDEPENDENT_AMBULATORY_CARE_PROVIDER_SITE_OTHER): Payer: Medicare HMO | Admitting: Orthopaedic Surgery

## 2020-04-05 ENCOUNTER — Ambulatory Visit: Payer: Self-pay

## 2020-04-05 ENCOUNTER — Other Ambulatory Visit: Payer: Self-pay

## 2020-04-05 VITALS — BP 133/81 | HR 71

## 2020-04-05 DIAGNOSIS — M25511 Pain in right shoulder: Secondary | ICD-10-CM

## 2020-04-05 DIAGNOSIS — M1712 Unilateral primary osteoarthritis, left knee: Secondary | ICD-10-CM | POA: Diagnosis not present

## 2020-04-05 DIAGNOSIS — M170 Bilateral primary osteoarthritis of knee: Secondary | ICD-10-CM

## 2020-04-05 MED ORDER — BUPIVACAINE HCL 0.5 % IJ SOLN
3.0000 mL | INTRAMUSCULAR | Status: AC | PRN
  Administered 2020-04-05: 3 mL via INTRA_ARTICULAR

## 2020-04-05 MED ORDER — LIDOCAINE HCL 1 % IJ SOLN
0.5000 mL | INTRAMUSCULAR | Status: AC | PRN
  Administered 2020-04-05: .5 mL

## 2020-04-05 MED ORDER — METHYLPREDNISOLONE ACETATE 40 MG/ML IJ SUSP
40.0000 mg | INTRAMUSCULAR | Status: AC | PRN
  Administered 2020-04-05: 40 mg via INTRA_ARTICULAR

## 2020-04-05 NOTE — Progress Notes (Signed)
Office Visit Note   Patient: Tracy Johnston           Date of Birth: [birth year]           MRN: [id number] Visit Date: 04/05/2020              Requested by: Leeanne Rio, MD [place],  [place] [address] PCP: Leeanne Rio, MD   Assessment & Plan: Visit Diagnoses:  1. Right shoulder pain, unspecified chronicity   2. Unilateral primary osteoarthritis, left knee   3. Bilateral primary osteoarthritis of knee     Plan: Left knee injection performed with good relief.  She can return as needed.  Follow-Up Instructions: No follow-ups on file.   Orders:  Orders Placed This Encounter  Procedures  . Large Joint Inj: L knee   No orders of the defined types were placed in this encounter.     Procedures: Large Joint Inj: L knee on 04/05/2020 10:09 AM Indications: joint swelling and pain Details: 22 G 1.5 in needle, anterolateral approach  Arthrogram: No  Medications: 0.5 mL lidocaine 1 %; 3 mL bupivacaine 0.5 %; 40 mg methylPREDNISolone acetate 40 MG/ML Outcome: tolerated well, no immediate complications Procedure, treatment alternatives, risks and benefits explained, specific risks discussed. Consent was given by the patient. Immediately prior to procedure a time out was called to verify the correct patient, procedure, equipment, support staff and site/side marked as required. Patient was prepped and draped in the usual sterile fashion.       Clinical Data: No additional findings.   Subjective: Chief Complaint  Patient presents with  . Right Shoulder - Pain  . Left Leg - Pain    HPI 70 year old female returns with increased problems with her left knee.  She has had bilateral total hip arthroplasties in 2013 doing well.  She is on Opana chronically for pain. Review of Systems   Objective: Vital Signs: BP 133/81   Pulse 71   Physical Exam Constitutional:      Appearance: She is well-developed.  HENT:     Head: Normocephalic.     Right Ear:  External ear normal.     Left Ear: External ear normal.  Eyes:     Pupils: Pupils are equal, round, and reactive to light.  Neck:     Thyroid: No thyromegaly.     Trachea: No tracheal deviation.  Cardiovascular:     Rate and Rhythm: Normal rate.  Pulmonary:     Effort: Pulmonary effort is normal.  Abdominal:     Palpations: Abdomen is soft.  Skin:    General: Skin is warm and dry.  Neurological:     Mental Status: She is alert and oriented to person, place, and time.  Psychiatric:        Behavior: Behavior normal.     Ortho Exam patient has crepitus with left knee range of motion 2+ knee effusion.  She is amatory with a cane.  Negative logroll the hips.  No rash over exposed skin no cellulitis. Specialty Comments:  No specialty comments available.  Imaging: No results found.   PMFS History: Patient Active Problem List   Diagnosis Date Noted  . BMI 33.0-33.9,adult 08/12/2018  . HLD (hyperlipidemia) 08/12/2018  . LLQ abdominal pain 08/12/2018  . Mixed stress and urge urinary incontinence 08/12/2018  . Bilateral primary osteoarthritis of knee 08/12/2018  . History of bilateral hip arthroplasty 07/09/2017  . Long-term current use of opiate analgesic 02/18/2017  .  On long term drug therapy 02/06/2017  . Chronic low back pain 02/06/2017  . Chronic pain syndrome 02/06/2017  . Pain in left knee 11/22/2015  . Incisional hernia, without obstruction or gangrene 09/09/2012  . Acute blood loss anemia 10/20/2011    Class: Acute  . COPD (chronic obstructive pulmonary disease) ([facility]) 03/03/2011  . Dyspnea 12/02/2010  . Preop cardiovascular exam 10/31/2010  . Claudication ([facility]) 10/18/2010  . Atrial fibrillation ([facility])   . Tobacco abuse   . Hypertension    Past Medical History:  Diagnosis Date  . Arthritis   . Asthma   . Atrial fibrillation (HCC)    chronic. Was first diagnosed in her early 19s.  . Cancer ([facility]) 2003ish   , cervix  . COPD (chronic obstructive pulmonary  disease) ([facility])   . Heart murmur   . Hypertension   . Insomnia   . Malignant neoplasm of kidney ([facility])    rt removed 2003  . Mitral regurgitation    Mild to moderate. Normal EF 09/2010  . Renal insufficiency   . Sickle cell anemia (HCC)   . Tobacco abuse     Family History  Problem Relation Age of Onset  . Diabetes Mother   . Cancer Mother        ovarian    Past Surgical History:  Procedure Laterality Date  . arm fracture     rods, pins and bone graft.- to left arm from hip  . BONE GRAFT HIP ILIAC CREST     to left arm  . CERVICAL CONIZATION W/BX    . NEPHRECTOMY  2003  . TOTAL HIP ARTHROPLASTY  10/17/2011   Procedure: TOTAL HIP ARTHROPLASTY;  Surgeon: [name], MD;  Location: [facility];  Service: Orthopedics;  Laterality: Left;  Left Total Hip Arthroplasty  . TOTAL HIP ARTHROPLASTY  01/02/2012   Procedure: TOTAL HIP ARTHROPLASTY;  Surgeon: [name], MD;  Location: [facility];  Service: Orthopedics;  Laterality: Right;  Right Total Hip Arthroplasty  . TUBAL LIGATION     Social History   Occupational History  . Not on file  Tobacco Use  . Smoking status: Former Smoker    Packs/day: 1.00    Years: 40.00    Pack years: 40.00    Types: Cigarettes    Start date: 10/21/1983    Quit date: 10/20/2017    Years since quitting: 2.4  . Smokeless tobacco: Never Used  . Tobacco comment: just quit since getting out of hospital 9/24 restarted smoking 9 months ago-AJ  Substance and Sexual Activity  . Alcohol use: Yes    Alcohol/week: 0.0 standard drinks    Comment: occasions only 1 glass of wine  . Drug use: No  . Sexual activity: Not on file

## 2020-04-10 ENCOUNTER — Other Ambulatory Visit: Payer: Self-pay

## 2020-04-10 DIAGNOSIS — M1712 Unilateral primary osteoarthritis, left knee: Secondary | ICD-10-CM

## 2020-04-18 DIAGNOSIS — K439 Ventral hernia without obstruction or gangrene: Secondary | ICD-10-CM | POA: Insufficient documentation

## 2020-04-26 ENCOUNTER — Ambulatory Visit (INDEPENDENT_AMBULATORY_CARE_PROVIDER_SITE_OTHER): Payer: Medicare HMO | Admitting: Orthopaedic Surgery

## 2020-04-26 ENCOUNTER — Other Ambulatory Visit: Payer: Self-pay

## 2020-04-26 DIAGNOSIS — M1712 Unilateral primary osteoarthritis, left knee: Secondary | ICD-10-CM | POA: Insufficient documentation

## 2020-04-26 NOTE — Progress Notes (Signed)
Office Visit Note   Patient: Tracy Johnston           Date of Birth: 1951-01-24           MRN: [id number] Visit Date: 04/26/2020              Requested by: Leeanne Rio, MD [place],  [place] [address] PCP: Leeanne Rio, MD   Assessment & Plan: Visit Diagnoses:  1. Unilateral primary osteoarthritis, left knee     Plan: We discussed total knee arthroplasty with areas of full-thickness cartilage loss multiple loose bodies.Anti-inflammatories and intra-articular injections have not been effective.  We discussed that knee arthroscopy would not give her any relief.  She has failed Conservative treatment and today we discussed total knee arthroplasty procedure.  Spinal anesthesia, home physical therapy for few weeks followed by having to make arrangements for outpatient physical therapy.  Importance of compliance with therapy and effort required to get a satisfactory result.  We discussed problems with pain control since she is already been on pain medication and may tolerate pain more poorly than normal.  Questions were elicited and answered.  She need cardiac clearance it would need to be off her Xarelto for the procedure.  Medical clearance for a COPD problem.  Questions were elicited and answered today.  Follow-Up Instructions: No follow-ups on file.   Orders:  No orders of the defined types were placed in this encounter.  No orders of the defined types were placed in this encounter.     Procedures: No procedures performed   Clinical Data: No additional findings.   Subjective: Chief Complaint  Patient presents with  . Other     MRI review    HPI 70 year old female returns with increased problems with left knee osteoarthritis.  She has had previous cortisone injections anti-inflammatories is walking with a cane.  Previous right and left total of arthroplasties back in 2013 did well and she was amatory without any walking aids until 1 year ago when  increasing left knee problems caused her to start to use a cane again.  Previous intra-articular injection did not help.  MRI scan has been obtained which shows areas of full-thickness cartilage loss, multiple loose bodies.  Large knee effusion, some loose bodies greater than 1.5 cm and areas of bone infarct proximal tibia distal femur.  Review of Systems patient is on Xarelto for chronic atrial fibrillation.  She has COPD past smoking history.  History of bilateral total arthroplasties which did well.  She is on Opana from Dr. Herma Mering chronically for pain management. Previous renal cell carcinoma.  Objective: Vital Signs: There were no vitals taken for this visit.  Physical Exam Constitutional:      Appearance: She is well-developed.  HENT:     Head: Normocephalic.     Right Ear: External ear normal.     Left Ear: External ear normal.  Eyes:     Pupils: Pupils are equal, round, and reactive to light.  Neck:     Thyroid: No thyromegaly.     Trachea: No tracheal deviation.  Cardiovascular:     Rate and Rhythm: Normal rate.  Pulmonary:     Effort: Pulmonary effort is normal.  Abdominal:     Palpations: Abdomen is soft.  Skin:    General: Skin is warm and dry.  Neurological:     Mental Status: She is alert and oriented to person, place, and time.  Psychiatric:  Behavior: Behavior normal.     Ortho Exam patient is amatory with a pronounced left knee limp.  Collateral ligaments are stable crepitus with knee range of motion she lacks 10 degrees reaching full extension flexes 210 degrees.  3+ knee effusion.  Ankle range of motion is normal.  Palpable distal pulse.  Specialty Comments:  No specialty comments available.  Imaging: Impression Performed by [facility] RAD 1. Large area of full-thickness chondral loss involving the  weight-bearing medial femoral condyle measuring at least 2.0 x 1.8  cm.  2. Large joint effusion with a 1.5 cm intra-articular loose body at  the posterior  joint line.  3. Extensive bone infarcts throughout the proximal tibia, distal  femur, and inferior patella.  4. Grade 1 MCL sprain.  5. Intrasubstance degeneration of the medial meniscus without  discrete tear.    Electronically Signed  By: Davina Poke D.O.  On: 04/20/2020 16:18  Narrative Performed by [facility] RAD CLINICAL DATA: Left knee pain and swelling for 2 weeks   EXAM:  MRI OF THE LOWER LEFT JOINT WITHOUT CONTRAST   TECHNIQUE:  Multiplanar, multisequence MR imaging of the knee was performed. No  intravenous contrast was administered.   COMPARISON: None.   FINDINGS:  MENISCI   Medial meniscus: Intermediate signal within the posterior horn and  body segments of the medial meniscus suggesting intrasubstance  degeneration. No discrete medial meniscal tear.   Lateral meniscus: Intact.   LIGAMENTS   Cruciates: Intact ACL and PCL.   Collaterals: Intact MCL with mild periligamentous edema. Lateral  collateral ligament complex intact.   CARTILAGE   Patellofemoral: No chondral defect.   Medial: Large area of full-thickness chondral loss involving the  weight-bearing medial femoral condyle measuring at least 2.0 x 1.8  cm.   Lateral: Mild chondral thinning of the weight-bearing lateral  compartment   Joint: Large joint effusion. Intra-articular loose body at the  posterior joint line measures 1.5x 0.5 x 1.5 cm (series 4, image  12). Fat pads within normal limits.   Popliteal Fossa: No Baker cyst. Intact popliteus tendon.   Extensor Mechanism: Intact quadriceps tendon and patellar tendon.  Mild proximal patellar tendinosis.   Bones: Extensive bone infarcts throughout the proximal tibia, distal  femur as well as a small bone infarct within the inferior patella.  No acute fracture. No dislocation. No suspicious bone lesion.   Other: None.  Procedure Note  Plundo, Lincoln Brigham, DO - 04/20/2020  Formatting of this note might be different from  the original.  CLINICAL DATA: Left knee pain and swelling for 2 weeks   EXAM:  MRI OF THE LOWER LEFT JOINT WITHOUT CONTRAST   TECHNIQUE:  Multiplanar, multisequence MR imaging of the knee was performed. No  intravenous contrast was administered.   COMPARISON: None.   FINDINGS:  MENISCI   Medial meniscus: Intermediate signal within the posterior horn and  body segments of the medial meniscus suggesting intrasubstance  degeneration. No discrete medial meniscal tear.   Lateral meniscus: Intact.   LIGAMENTS   Cruciates: Intact ACL and PCL.   Collaterals: Intact MCL with mild periligamentous edema. Lateral  collateral ligament complex intact.   CARTILAGE   Patellofemoral: No chondral defect.   Medial: Large area of full-thickness chondral loss involving the  weight-bearing medial femoral condyle measuring at least 2.0 x 1.8  cm.   Lateral: Mild chondral thinning of the weight-bearing lateral  compartment   Joint: Large joint effusion. Intra-articular loose body at the  posterior joint line measures  1.5 x 0.5 x 1.5 cm (series 4, image  12). Fat pads within normal limits.   Popliteal Fossa: No Baker cyst. Intact popliteus tendon.   Extensor Mechanism: Intact quadriceps tendon and patellar tendon.  Mild proximal patellar tendinosis.   Bones: Extensive bone infarcts throughout the proximal tibia, distal  femur as well as a small bone infarct within the inferior patella.  No acute fracture. No dislocation. No suspicious bone lesion.   Other: None.   IMPRESSION:  1. Large area of full-thickness chondral loss involving the  weight-bearing medial femoral condyle measuring at least 2.0 x 1.8  cm.  2. Large joint effusion with a 1.5 cm intra-articular loose body at  the posterior joint line.  3. Extensive bone infarcts throughout the proximal tibia, distal  femur, and inferior patella.  4. Grade 1 MCL sprain.  5. Intrasubstance degeneration of the medial meniscus  without  discrete tear.    Electronically Signed   By: Davina Poke D.O.   On: 04/20/2020 16:18    PMFS History: Patient Active Problem List   Diagnosis Date Noted  . Unilateral primary osteoarthritis, left knee 04/26/2020  . BMI 33.0-33.9,adult 08/12/2018  . HLD (hyperlipidemia) 08/12/2018  . LLQ abdominal pain 08/12/2018  . Mixed stress and urge urinary incontinence 08/12/2018  . Bilateral primary osteoarthritis of knee 08/12/2018  . History of bilateral hip arthroplasty 07/09/2017  . Long-term current use of opiate analgesic 02/18/2017  . On long term drug therapy 02/06/2017  . Chronic low back pain 02/06/2017  . Chronic pain syndrome 02/06/2017  . Pain in left knee 11/22/2015  . Incisional hernia, without obstruction or gangrene 09/09/2012  . Acute blood loss anemia 10/20/2011    Class: Acute  . COPD (chronic obstructive pulmonary disease) ([place]) 03/03/2011  . Dyspnea 12/02/2010  . Preop cardiovascular exam 10/31/2010  . Claudication ([place]) 10/18/2010  . Atrial fibrillation ([place])   . Tobacco abuse   . Hypertension    Past Medical History:  Diagnosis Date  . Arthritis   . Asthma   . Atrial fibrillation (HCC)    chronic. Was first diagnosed in her early 33s.  . 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 COPD (chronic obstructive pulmonary disease) ([place])   . Heart murmur   . Hypertension   . Insomnia   . Malignant neoplasm of kidney ([place])    rt removed 2003  . Mitral regurgitation    Mild to moderate. Normal EF 09/2010  . Renal insufficiency   . Sickle cell anemia (HCC)   . Tobacco abuse     Family History  Problem Relation Age of Onset  . Diabetes Mother   . Cancer Mother        ovarian    Past Surgical History:  Procedure Laterality Date  . arm fracture     rods, pins and bone graft.- to left arm from hip  . BONE GRAFT HIP ILIAC CREST     to left arm  . CERVICAL CONIZATION W/BX    . NEPHRECTOMY  2003  . TOTAL HIP ARTHROPLASTY  10/17/2011    Procedure: TOTAL HIP ARTHROPLASTY;  Surgeon: [name], MD;  Location: [facility];  Service: Orthopedics;  Laterality: Left;  Left Total Hip Arthroplasty  . TOTAL HIP ARTHROPLASTY  01/02/2012   Procedure: TOTAL HIP ARTHROPLASTY;  Surgeon: [name], MD;  Location: [facility];  Service: Orthopedics;  Laterality: Right;  Right Total Hip Arthroplasty  . TUBAL LIGATION     Social History  Occupational History  . Not on file  Tobacco Use  . Smoking status: Former Smoker    Packs/day: 1.00    Years: 40.00    Pack years: 40.00    Types: Cigarettes    Start date: 10/21/1983    Quit date: 10/20/2017    Years since quitting: 2.5  . Smokeless tobacco: Never Used  . Tobacco comment: just quit since getting out of hospital 9/24 restarted smoking 9 months ago-AJ  Substance and Sexual Activity  . Alcohol use: Yes    Alcohol/week: 0.0 standard drinks    Comment: occasions only 1 glass of wine  . Drug use: No  . Sexual activity: Not on file

## 2020-05-01 ENCOUNTER — Telehealth: Payer: Self-pay | Admitting: Radiology

## 2020-05-01 ENCOUNTER — Telehealth: Payer: Self-pay

## 2020-05-01 NOTE — Telephone Encounter (Signed)
Needs to avoid W/C , her leg will get weaker and will be worse for therapy after surgery.

## 2020-05-01 NOTE — Telephone Encounter (Signed)
error 

## 2020-05-01 NOTE — Telephone Encounter (Signed)
Called patient and let her know; she was very angry stating she needs something now because she can only walk on one leg and the pain is getting so much worse

## 2020-05-01 NOTE — Telephone Encounter (Signed)
Please see message from [place] office below and advise.  Patient:  Tracy Johnston  DOB:  07/23/1950  Phone #:  [phone]     She called and is asking for a wheelchair for her left leg pain.  She stated she is having surgery soon

## 2020-05-03 ENCOUNTER — Telehealth: Payer: Self-pay | Admitting: *Deleted

## 2020-05-03 DIAGNOSIS — Z01810 Encounter for preprocedural cardiovascular examination: Secondary | ICD-10-CM

## 2020-05-03 NOTE — Telephone Encounter (Signed)
Patient with diagnosis of afib on Xarelto for anticoagulation.    Procedure: Left total knee arthroplasty Date of procedure: TBD  CHA2DS2-VASc Score = 3  This indicates a 3.2% annual risk of stroke. The patient's score is based upon: CHF History: No HTN History: Yes Diabetes History: No Stroke History: No Vascular Disease History: No Age Score: 1 Gender Score: 1     CrCl 62 ml/min Platelet count 195  Per office protocol, patient can hold Xarelo for 3 days prior to procedure.

## 2020-05-03 NOTE — Telephone Encounter (Signed)
   [facility] Pre-operative Risk Assessment    Patient Name: Tracy Johnston  DOB: [birth year]  MRN: [id number]   HEARTCARE STAFF: - Please ensure there is not already an duplicate clearance open for this procedure. - Under Visit Info/Reason for Call, type in Other and utilize the format Clearance MM/DD/YY or Clearance TBD. Do not use dashes or single digits. - If request is for dental extraction, please clarify the # of teeth to be extracted.  Request for surgical clearance:  1. What type of surgery is being performed?  Left total knee arthroplasty  2. When is this surgery scheduled? TBD  3. What type of clearance is required (medical clearance vs. Pharmacy clearance to hold med vs. Both)? both  4. Are there any medications that need to be held prior to surgery and how long? xarelto-need duration  5. Practice name and name of physician performing surgery? [facility]/Dr. Rodell Perna  6. What is the office phone number? [phone]   7.   What is the office fax number? [phone]  8.   Anesthesia type (None, local, MAC, general) ? spinal   Marlou Sa 05/03/2020, 12:51 PM  _________________________________________________________________   (provider comments below)

## 2020-05-03 NOTE — Telephone Encounter (Addendum)
Dr. Harl Bowie to review.  Patient is a pleasant 70 year old female with past medical history of moderate MR, permanent atrial fibrillation, hypertension, COPD, tobacco abuse and sickle cell anemia.  Last Myoview was in April 2015.  Last echocardiogram obtained on 06/23/2019 showed EF 55 to 60%, moderate MR.  Patient was last seen in November 2021 by Levell July at which time she was doing well.  Talking with the patient, she can only walk around with a walker due to significant knee issue.  She denies any recent chest pain or worsening dyspnea on exertion.  She is unable to achieve 4 METS of activity due to knee problem.  From your perspective, do you recommend additional work-up prior to final cardiac clearance.  Please forward your response to P CV DIV PREOP

## 2020-05-03 NOTE — Telephone Encounter (Signed)
Clinical pharmacist to review Xarelto 

## 2020-05-04 ENCOUNTER — Encounter: Payer: Self-pay | Admitting: *Deleted

## 2020-05-04 NOTE — Telephone Encounter (Signed)
Would recommend a lexiscan, Alma Friendly could we order a lexiscan for preoperative risk evaluation   Zandra Abts MD

## 2020-05-04 NOTE — Telephone Encounter (Signed)
Patient informed and verbalized understanding of plan. Lexiscan instructions read to patient and made available in care everywhere and for pick up. Verbalized understanding.

## 2020-05-15 ENCOUNTER — Encounter (HOSPITAL_BASED_OUTPATIENT_CLINIC_OR_DEPARTMENT_OTHER)
Admission: RE | Admit: 2020-05-15 | Discharge: 2020-05-15 | Disposition: A | Discharge status: Home / Self Care | Payer: Medicare HMO | Source: Ambulatory Visit | Attending: Cardiology | Admitting: Cardiology

## 2020-05-15 ENCOUNTER — Encounter (HOSPITAL_COMMUNITY)
Admission: RE | Admit: 2020-05-15 | Discharge: 2020-05-15 | Disposition: A | Discharge status: Home / Self Care | Payer: Medicare HMO | Source: Ambulatory Visit | Attending: Cardiology | Admitting: Cardiology

## 2020-05-15 DIAGNOSIS — Z01810 Encounter for preprocedural cardiovascular examination: Secondary | ICD-10-CM

## 2020-05-15 LAB — NM MYOCAR MULTI W/SPECT W/WALL MOTION / EF
LV dias vol: 95 mL (ref 46–106)
LV sys vol: 41 mL
Peak HR: 82 {beats}/min
RATE: 0.41
Rest HR: 72 {beats}/min
SDS: 2
SRS: 1
SSS: 3
TID: 1.24

## 2020-05-15 MED ORDER — REGADENOSON 0.4 MG/5ML IV SOLN
INTRAVENOUS | Status: AC
  Administered 2020-05-15: 0.4 mg via INTRAVENOUS
  Filled 2020-05-15: qty 5

## 2020-05-15 MED ORDER — SODIUM CHLORIDE FLUSH 0.9 % IV SOLN
INTRAVENOUS | Status: AC
  Administered 2020-05-15: 10 mL via INTRAVENOUS
  Filled 2020-05-15: qty 10

## 2020-05-15 MED ORDER — TECHNETIUM TC 99M TETROFOSMIN IV KIT
30.0000 | PACK | Freq: Once | INTRAVENOUS | Status: AC | PRN
  Administered 2020-05-15: 30.2 via INTRAVENOUS

## 2020-05-15 MED ORDER — TECHNETIUM TC 99M TETROFOSMIN IV KIT
10.0000 | PACK | Freq: Once | INTRAVENOUS | Status: AC | PRN
  Administered 2020-05-15: 10.28 via INTRAVENOUS

## 2020-05-17 NOTE — Telephone Encounter (Signed)
    Tracy Johnston DOB:  1950/07/18  MRN:  [id number]   Primary Cardiologist: Kate Sable, MD (Inactive)  Chart reviewed as part of pre-operative protocol coverage. Given past medical history and time since last visit, based on ACC/AHA guidelines, Tracy Johnston would be at acceptable risk for the planned procedure without further cardiovascular testing.   Recent nuclear stress test was low risk.  Patient is cleared.  The patient was advised that if she develops new symptoms prior to surgery to contact our office to arrange for a follow-up visit, and she verbalized understanding.  Our clinical pharmacist recommended the patient hold Xarelto for 3 days prior to the procedure and restart as soon as possible afterward at the surgeon's discretion.  I will route this recommendation to the requesting party via Epic fax function and remove from pre-op pool.  Please call with questions.  [place], [place] 05/17/2020, 6:27 PM

## 2020-05-17 NOTE — Telephone Encounter (Signed)
Please inform the patient that her recent nuclear stress test was low risk.  She has been cleared for surgery.  She will need to hold Xarelto for 3 days prior to the procedure and restart the medication as soon as possible afterward at the surgeon's discretion.

## 2020-05-18 NOTE — Telephone Encounter (Signed)
Pt made aware that she is cleared for her surgery and that its okay for her to hold Xarelto 3 days prior to surgery,. Pt verbalized understanding.

## 2020-05-24 ENCOUNTER — Telehealth: Payer: Self-pay | Admitting: *Deleted

## 2020-05-24 NOTE — Telephone Encounter (Signed)
Pt aware.

## 2020-05-24 NOTE — Telephone Encounter (Signed)
-----   Message from Arnoldo Lenis, MD sent at 05/21/2020 12:45 PM EDT ----- Normal stress test, ok to proceed with her planned surgery   Zandra Abts MD

## 2020-05-25 ENCOUNTER — Other Ambulatory Visit: Payer: Self-pay

## 2020-06-07 ENCOUNTER — Ambulatory Visit (INDEPENDENT_AMBULATORY_CARE_PROVIDER_SITE_OTHER): Payer: Medicare HMO | Admitting: Surgery

## 2020-06-07 ENCOUNTER — Encounter: Payer: Self-pay | Admitting: Surgery

## 2020-06-07 VITALS — BP 154/83 | HR 83 | Ht 64.0 in | Wt 202.0 lb

## 2020-06-07 DIAGNOSIS — M1712 Unilateral primary osteoarthritis, left knee: Secondary | ICD-10-CM

## 2020-06-07 NOTE — Progress Notes (Signed)
70 year old white female with history of end-stage DJD left knee and pain comes in for preop evaluation.  States that left knee symptoms unchanged from previous visit.  She is wanting to proceed with left total knee replacement as scheduled.  Today history and physical performed.  Review of systems positive for productive cough, chest congestion, dyspnea.  Patient has a history of COPD.  We received preop medical clearance from Dr. Catalina Antigua and that was dated May 02, 2020.  Patient states that she has not seen her physician with these current symptoms since onset.  States that she did not use one of her medications this morning.  We have received preop cardiac clearance.  Patient was advised to stop her Xarelto which she takes for atrial fibrillation 3 days preop.  Exam Patient does have a productive cough.  She does appear to be somewhat short of breath.  She is alert and oriented.  No distress.  Lungs she does have right-sided inspiratory and expiratory wheezes with scattered rhonchi.  Left side sounds more clear.   Plan I did call patient's primary care office during patient's visit with me today.  I was able to speak with one of the clinical assistance but she was not able to take the information for what I was calling for.  Said that she would call me back.  After waiting about 20 minutes I advised patient to leave our office and go straight to her primary care office for evaluation so they can make any additions to her medications that are needed so she is able to proceed with her surgery that is scheduled for Jun 15, 2020.  Patient voices understanding of going directly to their office.

## 2020-06-12 NOTE — Pre-Procedure Instructions (Signed)
Tracy Johnston  06/12/2020      [id number] [place],  - [place] [address] [place] [address] Phone: [phone] Fax: [phone]    Your procedure is scheduled on May 20  Report to [facility] Entrance A at 5:30 A.M.  Call this number if you have problems the morning of surgery:  [phone]   Remember:  Do not eat  after midnight.  You may drink clear liquids until 4:30 AM.  Clear liquids allowed are:                    Water, Juice (non-citric and without pulp - diabetics please choose diet or no sugar options), Carbonated beverages - (diabetics please choose diet or no sugar options), Clear Tea, Black Coffee only (no creamer, milk or cream including half and half), Plain Jell-O only (diabetics please choose diet or no sugar options), Gatorade (diabetics please choose diet or no sugar options) and Plain Popsicles only    Take these medicines the morning of surgery with A SIP OF WATER :             Allopurinol (zyloprim)            Diltiazem (cardizem)            trelegy inhaler--bring to hospital            duoneb            byrbetriq            Oxymorphone (opana) if needed            Prednisone (deltasone) if needed            Pro air if needed--bring to hospital           Simvastatin (zocor)           STOP RIVAROXABAN/XARELTO 3 DAYS PRIOR TO SURGERY              7 days prior to surgery STOP taking any Aspirin (unless otherwise instructed by your surgeon), Aleve, Naproxen, Ibuprofen, Motrin, Advil, Goody's, BC's, all herbal medications, fish oil, and all vitamins.    Do not wear jewelry, make-up or nail polish.  Do not wear lotions, powders, or perfumes, or deodorant.  Do not shave 48 hours prior to surgery.  Men may shave face and neck.  Do not bring valuables to the hospital.  [facility] is not responsible for any belongings or valuables.  Contacts, dentures or bridgework may not be worn into surgery.   Leave your suitcase in the car.  After surgery it may be brought to your room.  For patients admitted to the hospital, discharge time will be determined by your treatment team.  Patients discharged the day of surgery will not be allowed to drive home.    Special instructions:  [facility]- Preparing For Surgery  Before surgery, you can play an important role. Because skin is not sterile, your skin needs to be as free of germs as possible. You can reduce the number of germs on your skin by washing with CHG (chlorahexidine gluconate) Soap before surgery.  CHG is an antiseptic cleaner which kills germs and bonds with the skin to continue killing germs even after washing.    Oral Hygiene is also important to reduce your risk of infection.  Remember - BRUSH YOUR TEETH THE MORNING OF SURGERY WITH YOUR REGULAR TOOTHPASTE  Please do not use if you have an allergy to CHG or antibacterial soaps. If your skin becomes reddened/irritated stop using the CHG.  Do not shave (including legs and underarms) for at least 48 hours prior to first CHG shower. It is OK to shave your face.  Please follow these instructions carefully.   1. Shower the NIGHT BEFORE SURGERY and the MORNING OF SURGERY with CHG.   2. If you chose to wash your hair, wash your hair first as usual with your normal shampoo.  3. After you shampoo, rinse your hair and body thoroughly to remove the shampoo.  4. Use CHG as you would any other liquid soap. You can apply CHG directly to the skin and wash gently with a scrungie or a clean washcloth.   5. Apply the CHG Soap to your body ONLY FROM THE NECK DOWN.  Do not use on open wounds or open sores. Avoid contact with your eyes, ears, mouth and genitals (private parts). Wash Face and genitals (private parts)  with your normal soap.  6. Wash thoroughly, paying special attention to the area where your surgery will be performed.  7. Thoroughly rinse your body with warm water from the neck  down.  8. DO NOT shower/wash with your normal soap after using and rinsing off the CHG Soap.  9. Pat yourself dry with a CLEAN TOWEL.  10. Wear CLEAN PAJAMAS to bed the night before surgery, wear comfortable clothes the morning of surgery  11. Place CLEAN SHEETS on your bed the night of your first shower and DO NOT SLEEP WITH PETS.    Day of Surgery:  Do not apply any deodorants/lotions.  Please wear clean clothes to the hospital/surgery center.   Remember to brush your teeth WITH YOUR REGULAR TOOTHPASTE.    Please read over the following fact sheets that you were given.

## 2020-06-13 ENCOUNTER — Encounter (HOSPITAL_COMMUNITY): Payer: Self-pay

## 2020-06-13 ENCOUNTER — Other Ambulatory Visit: Payer: Self-pay

## 2020-06-13 ENCOUNTER — Ambulatory Visit (HOSPITAL_COMMUNITY)
Admission: RE | Admit: 2020-06-13 | Discharge: 2020-06-13 | Disposition: A | Discharge status: Home / Self Care | Payer: Medicare HMO | Source: Ambulatory Visit | Attending: Surgery | Admitting: Surgery

## 2020-06-13 ENCOUNTER — Encounter (HOSPITAL_COMMUNITY)
Admission: RE | Admit: 2020-06-13 | Discharge: 2020-06-13 | Disposition: A | Discharge status: Home / Self Care | Payer: Medicare HMO | Source: Ambulatory Visit | Attending: Orthopaedic Surgery | Admitting: Orthopaedic Surgery

## 2020-06-13 DIAGNOSIS — I517 Cardiomegaly: Secondary | ICD-10-CM | POA: Insufficient documentation

## 2020-06-13 DIAGNOSIS — Z87891 Personal history of nicotine dependence: Secondary | ICD-10-CM | POA: Insufficient documentation

## 2020-06-13 DIAGNOSIS — Z01818 Encounter for other preprocedural examination: Secondary | ICD-10-CM | POA: Diagnosis not present

## 2020-06-13 DIAGNOSIS — Z20822 Contact with and (suspected) exposure to covid-19: Secondary | ICD-10-CM | POA: Diagnosis not present

## 2020-06-13 HISTORY — DX: Cardiac arrhythmia, unspecified: I49.9

## 2020-06-13 LAB — URINALYSIS, ROUTINE W REFLEX MICROSCOPIC
Bilirubin Urine: NEGATIVE
Glucose, UA: NEGATIVE mg/dL
Hgb urine dipstick: NEGATIVE
Ketones, ur: NEGATIVE mg/dL
Leukocytes,Ua: NEGATIVE
Nitrite: NEGATIVE
Protein, ur: 100 mg/dL — AB
Specific Gravity, Urine: 1.025 (ref 1.005–1.030)
pH: 6 (ref 5.0–8.0)

## 2020-06-13 LAB — COMPREHENSIVE METABOLIC PANEL
ALT: 12 U/L (ref 0–44)
AST: 13 U/L — ABNORMAL LOW (ref 15–41)
Albumin: 3.3 g/dL — ABNORMAL LOW (ref 3.5–5.0)
Alkaline Phosphatase: 84 U/L (ref 38–126)
Anion gap: 6 (ref 5–15)
BUN: 19 mg/dL (ref 8–23)
CO2: 26 mmol/L (ref 22–32)
Calcium: 9.4 mg/dL (ref 8.9–10.3)
Chloride: 109 mmol/L (ref 98–111)
Creatinine, Ser: 1.04 mg/dL — ABNORMAL HIGH (ref 0.44–1.00)
GFR, Estimated: 57 mL/min — ABNORMAL LOW (ref >60)
Glucose, Bld: 91 mg/dL (ref 70–99)
Potassium: 3.9 mmol/L (ref 3.5–5.1)
Sodium: 141 mmol/L (ref 135–145)
Total Bilirubin: 0.6 mg/dL (ref 0.3–1.2)
Total Protein: 6.7 g/dL (ref 6.5–8.1)

## 2020-06-13 LAB — CBC
HCT: 45.6 % (ref 36.0–46.0)
Hemoglobin: 16 g/dL — ABNORMAL HIGH (ref 12.0–15.0)
MCH: 31.3 pg (ref 26.0–34.0)
MCHC: 35.1 g/dL (ref 30.0–36.0)
MCV: 89.2 fL (ref 80.0–100.0)
Platelets: 226 10*3/uL (ref 150–400)
RBC: 5.11 MIL/uL (ref 3.87–5.11)
RDW: 14.4 % (ref 11.5–15.5)
WBC: 12.4 10*3/uL — ABNORMAL HIGH (ref 4.0–10.5)
nRBC: 0 % (ref 0.0–0.2)

## 2020-06-13 LAB — URINALYSIS, MICROSCOPIC (REFLEX)

## 2020-06-13 LAB — SARS CORONAVIRUS 2 (TAT 6-24 HRS): SARS Coronavirus 2: NEGATIVE

## 2020-06-13 LAB — SURGICAL PCR SCREEN
MRSA, PCR: NEGATIVE
Staphylococcus aureus: NEGATIVE

## 2020-06-13 NOTE — Progress Notes (Addendum)
PCP - Dr. Catalina Antigua Cardiologist - Dr. Arnoldo Morale at [facility] in [place]  Chest x-ray -06/13/20 EKG - 11/2019 spoke to [place], [place] new EKG not needed  Stress Test - Yes 05/15/20 ECHO - 06/23/19 Cardiac Cath - Denies  Sleep Study - Denies  DM - denies  Blood Thinner Instructions: Stopping Xarelto 3 days prior to surgery  ERAS Protcol -Yes PRE-SURGERY Ensure given   COVID TEST- 06/13/20   Anesthesia review: Yes cardiac history  Patient denies shortness of breath, fever, cough and chest pain at PAT appointment   All instructions explained to the patient, with a verbal understanding of the material. Patient agrees to go over the instructions while at home for a better understanding.  The opportunity to ask questions was provided.

## 2020-06-14 NOTE — H&P (Signed)
TOTAL KNEE ADMISSION H&P  Patient is being admitted for left total knee arthroplasty.  Subjective:  Chief Complaint:left knee pain.  HPI: Tracy Johnston, 70 y.o. female, has a history of pain and functional disability in the left knee due to arthritis and has failed non-surgical conservative treatments for greater than 12 weeks to includeNSAID's and/or analgesics, corticosteriod injections and use of assistive devices.  Onset of symptoms was gradual, starting 6 years ago with gradually worsening course since that time. The patient noted prior knee arthroscopy on the left knee(s).  Patient currently rates pain in the left knee(s) at 5 out of 10 with activity. Patient has night pain, worsening of pain with activity and weight bearing, pain that interferes with activities of daily living, crepitus and joint swelling.  Patient has evidence of subchondral cysts, subchondral sclerosis and joint space narrowing by imaging studies. This patient has had previous failed conservative treatment. . There is no active infection.  Patient Active Problem List   Diagnosis Date Noted  . Unilateral primary osteoarthritis, left knee 04/26/2020  . BMI 33.0-33.9,adult 08/12/2018  . HLD (hyperlipidemia) 08/12/2018  . LLQ abdominal pain 08/12/2018  . Mixed stress and urge urinary incontinence 08/12/2018  . Bilateral primary osteoarthritis of knee 08/12/2018  . History of bilateral hip arthroplasty 07/09/2017  . Long-term current use of opiate analgesic 02/18/2017  . On long term drug therapy 02/06/2017  . Chronic low back pain 02/06/2017  . Chronic pain syndrome 02/06/2017  . Pain in left knee 11/22/2015  . Incisional hernia, without obstruction or gangrene 09/09/2012  . Acute blood loss anemia 10/20/2011  . COPD (chronic obstructive pulmonary disease) ([place]) 03/03/2011  . Dyspnea 12/02/2010  . Preop cardiovascular exam 10/31/2010  . Claudication ([place]) 10/18/2010  . Atrial fibrillation ([place])   . Tobacco abuse    . Hypertension    Past Medical History:  Diagnosis Date  . Arthritis   . Asthma   . Atrial fibrillation (HCC)    chronic. Was first diagnosed in her early 59s.  . 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 COPD (chronic obstructive pulmonary disease) ([place])   . Dysrhythmia   . Heart murmur   . Hypertension   . Insomnia   . Malignant neoplasm of kidney ([place])    rt removed 2003  . Mitral regurgitation    Mild to moderate. Normal EF 09/2010  . Renal insufficiency   . Sickle cell anemia (HCC)   . Tobacco abuse     Past Surgical History:  Procedure Laterality Date  . ABDOMINAL HYSTERECTOMY    . arm fracture     rods, pins and bone graft.- to left arm from hip  . BONE GRAFT HIP ILIAC CREST     to left arm  . CERVICAL CONIZATION W/BX    . NEPHRECTOMY  2003  . TONSILLECTOMY    . TOTAL HIP ARTHROPLASTY  10/17/2011   Procedure: TOTAL HIP ARTHROPLASTY;  Surgeon: [name], MD;  Location: [facility];  Service: Orthopedics;  Laterality: Left;  Left Total Hip Arthroplasty  . TOTAL HIP ARTHROPLASTY  01/02/2012   Procedure: TOTAL HIP ARTHROPLASTY;  Surgeon: [name], MD;  Location: [facility];  Service: Orthopedics;  Laterality: Right;  Right Total Hip Arthroplasty  . TUBAL LIGATION      No current facility-administered medications for this encounter.   Current Outpatient Medications  Medication Sig Dispense Refill Last Dose  . allopurinol (ZYLOPRIM) 300 MG tablet TAKE 1 TABLET BY MOUTH DAILY. (Patient taking  differently: Take 300 mg by mouth daily.) 30 tablet 5   . diltiazem (CARDIZEM CD) 240 MG 24 hr capsule Take 1 capsule (240 mg total) by mouth daily. 30 capsule 6   . Fluticasone-Umeclidin-Vilant (TRELEGY ELLIPTA) 100-62.5-25 MCG/INH AEPB Inhale 1 puff into the lungs daily.     Marland Kitchen ibuprofen (ADVIL) 800 MG tablet TAKE 1 TABLET BY MOUTH TWICE DAILY AS NEEDED WITH FOOD (Patient taking differently: Take 800 mg by mouth 2 (two) times daily as needed for moderate pain.) 60 tablet 0   .  ipratropium-albuterol (DUONEB) 0.5-2.5 (3) MG/3ML SOLN Inhale 3 mLs into the lungs every 6 (six) hours as needed for shortness of breath.     . montelukast (SINGULAIR) 10 MG tablet Take 10 mg by mouth at bedtime as needed for allergies.     Marland Kitchen MYRBETRIQ 50 MG TB24 tablet Take 50 mg by mouth daily.     Marland Kitchen olmesartan-hydrochlorothiazide (BENICAR HCT) 40-12.5 MG per tablet Take 1 tablet by mouth daily.       Marland Kitchen oxymorphone (OPANA) 10 MG tablet Take 10 mg by mouth every 8 (eight) hours as needed for pain.     . polyethylene glycol (MIRALAX / GLYCOLAX) 17 g packet Take 17 g by mouth every other day.     . predniSONE (DELTASONE) 20 MG tablet Take 20 mg by mouth daily as needed (short of breath).  0   . PROAIR HFA 108 (90 Base) MCG/ACT inhaler Inhale 2 puffs into the lungs every 4 (four) hours as needed for shortness of breath or wheezing.     . rivaroxaban (XARELTO) 20 MG TABS tablet Take 1 tablet (20 mg total) by mouth daily. (Patient taking differently: Take 20 mg by mouth at bedtime.) 90 tablet 3   . simvastatin (ZOCOR) 20 MG tablet Take 20 mg by mouth daily.      Allergies  Allergen Reactions  . Bupropion Hives  . Aspirin Other (See Comments)    Bleeding at higher doses  . Latex Rash    When she wears gloves    Social History   Tobacco Use  . Smoking status: Former Smoker    Packs/day: 1.00    Years: 40.00    Pack years: 40.00    Types: Cigarettes    Start date: 10/21/1983    Quit date: 10/20/2017    Years since quitting: 2.6  . Smokeless tobacco: Never Used  . Tobacco comment: just quit since getting out of hospital 9/24 restarted smoking 9 months ago-AJ  Substance Use Topics  . Alcohol use: Yes    Alcohol/week: 0.0 standard drinks    Comment: occasions only 1 glass of wine    Family History  Problem Relation Age of Onset  . Diabetes Mother   . Cancer Mother        ovarian     Review of Systems patient is on Xarelto for chronic atrial fibrillation.  She has COPD past smoking  history.  History of bilateral total arthroplasties which did well.  She is on Opana from Dr. Herma Mering chronically for pain management. Previous renal cell carcinoma.  Objective:  Physical Exam Constitutional:      Appearance: Normal appearance.  HENT:     Head: Normocephalic.     Right Ear: External ear normal.     Left Ear: External ear normal.     Mouth/Throat:     Mouth: Mucous membranes are moist.  Eyes:     Extraocular Movements: Extraocular movements intact.  Cardiovascular:  Comments: Irregular rate Abdominal:     Palpations: Abdomen is soft.  Musculoskeletal:     Cervical back: Neck supple.  Skin:    General: Skin is warm.     Capillary Refill: Capillary refill takes less than 2 seconds.     Coloration: Skin is not jaundiced.  Neurological:     Mental Status: She is alert.  Psychiatric:        Mood and Affect: Mood normal.        Behavior: Behavior normal.     Vital signs in last 24 hours:    Labs:   Estimated body mass index is 35.27 kg/m as calculated from the following:   Height as of 06/13/20: 5\' 4"  (1.626 m).   Weight as of 06/13/20: 93.2 kg.   Imaging Review Plain radiographs demonstrate moderate degenerative joint disease of the left knee(s). The overall alignment isneutral. The bone quality appears to be good for age and reported activity level.      Assessment/Plan:  End stage arthritis, left knee   The patient history, physical examination, clinical judgment of the provider and imaging studies are consistent with end stage degenerative joint disease of the left knee(s) and total knee arthroplasty is deemed medically necessary. The treatment options including medical management, injection therapy arthroscopy and arthroplasty were discussed at length. The risks and benefits of total knee arthroplasty were presented and reviewed. The risks due to aseptic loosening, infection, stiffness, patella tracking problems, thromboembolic complications and  other imponderables were discussed. The patient acknowledged the explanation, agreed to proceed with the plan and consent was signed. Patient is being admitted for inpatient treatment for surgery, pain control, PT, OT, prophylactic antibiotics, VTE prophylaxis, progressive ambulation and ADL's and discharge planning. The patient is planning to be discharged home with home health services     Patient's anticipated LOS is less than 2 midnights, meeting these requirements: - Younger than 57 - Lives within 1 hour of care - Has a competent adult at home to recover with post-op recover - NO history of  - Chronic pain requiring opiods  - Diabetes  - Coronary Artery Disease  - Heart failure  - Heart attack  - Stroke  - DVT/VTE  - Cardiac arrhythmia  - Respiratory Failure/COPD  - Renal failure  - Anemia  - Advanced Liver disease

## 2020-06-14 NOTE — Progress Notes (Signed)
Anesthesia Chart Review:  Follows with cardiology for history of moderate MR, permanent atrial fibrillation on Xarelto, HTN.  Last echo 06/23/2019 showed EF 55 to 60%, moderate MR, moderate AR.  Nuclear stress 05/15/2020 showed no ischemia, low risk.  She was cleared for surgery per telephone encounter 05/17/2020, "Chart reviewed as part of pre-operative protocol coverage. Given past medical history and time since last visit, based on ACC/AHA guidelines, Gurbani Figge would be at acceptable risk for the planned procedure without further cardiovascular testing. Recent nuclear stress test was low risk.  Patient is cleared. The patient was advised that if she develops new symptoms prior to surgery to contact our office to arrange for a follow-up visit, and she verbalized understanding. Our clinical pharmacist recommended the patient hold Xarelto for 3 days prior to the procedure and restart as soon as possible afterward at the surgeon's discretion."  History of COPD followed by PCP Dr[facility] Huel Cote at [facility], maintained on Trelegy and as needed DuoNeb.  She was last seen 06/07/2020 and at that time was having a mild COPD exacerbation and her upcoming surgery was discussed.  Per note, "Pt left orthopedic's office today in preparation of surgery. She was medically cleared last visit in 04/2020. Pt presented in acute COPD Exacerbation. She was asked to come to the office for antibiotics today. Pt says she gets this every year at least 2x a year. Pt is well known to me and this occurs at least once a year. She has slight wheezing but says her SOB is stable. Coughing up some yellowish phlegm."  She was prescribed a 7-day course of levofloxacin and prednisone Dosepak.  Patient denied shortness of breath, fever, cough at preop appointment 06/14/2019.  Preop labs reviewed, unremarkable.  EKG 12/19/2019: Atrial fibrillation with a competing junctional pacemaker.  ST and T wave abnormalities, consider inferolateral ischemia.  Rate  79.  Nuclear stress 05/15/2020:  There was no ST segment deviation noted during stress.  The study is normal. There are no perfusion defects consistent with prior infarct or current ischemia.  This is a low risk study.  The left ventricular ejection fraction is normal (55-65%).  TTE 06/23/2019: 1. Left ventricular ejection fraction, by estimation, is 55 to 60%. The  left ventricle has normal function. The left ventricle has no regional  wall motion abnormalities. Left ventricular diastolic parameters were  normal.  2. Right ventricular systolic function is normal. The right ventricular  size is normal. There is normal pulmonary artery systolic pressure.  3. Left atrial size was severely dilated.  4. Right atrial size was mildly dilated.  5. The mitral valve is normal in structure. Moderate mitral valve  regurgitation. No evidence of mitral stenosis.  6. The aortic valve is tricuspid. Aortic valve regurgitation is moderate.  No aortic stenosis is present.  7. The inferior vena cava is normal in size with greater than 50%  respiratory variability, suggesting right atrial pressure of 3 mmHg.   Comparison(s): Echocardiogram done 12/08/18 showed an EF of 55-60% with  moderate to severe AI.    Wynonia Musty [facility] Short Stay Center/Anesthesiology Phone [phone] 06/14/2020 3:57 PM

## 2020-06-14 NOTE — Anesthesia Preprocedure Evaluation (Addendum)
Anesthesia Evaluation  Patient identified by MRN, date of birth, ID band Patient awake    Reviewed: Allergy & Precautions, NPO status , Patient's Chart, lab work & pertinent test results  Airway Mallampati: II  TM Distance: >3 FB Neck ROM: Full    Dental  (+) Edentulous Upper, Edentulous Lower   Pulmonary shortness of breath, asthma , COPD, former smoker,  On prednisone   Pulmonary exam normal breath sounds clear to auscultation       Cardiovascular hypertension, negative cardio ROS Normal cardiovascular exam+ dysrhythmias Atrial Fibrillation + pacemaker  Rhythm:Regular Rate:Normal     Neuro/Psych Chronic pain on Opana negative neurological ROS  negative psych ROS   GI/Hepatic negative GI ROS, Neg liver ROS,   Endo/Other  negative endocrine ROS  Renal/GU Renal InsufficiencyRenal disease  negative genitourinary   Musculoskeletal  (+) Arthritis , Osteoarthritis,    Abdominal   Peds negative pediatric ROS (+)  Hematology negative hematology ROS (+)   Anesthesia Other Findings   Reproductive/Obstetrics negative OB ROS                          Anesthesia Physical Anesthesia Plan  ASA: III  Anesthesia Plan: MAC and Spinal   Post-op Pain Management:  Regional for Post-op pain   Induction: Intravenous  PONV Risk Score and Plan: Propofol infusion and Treatment may vary due to age or medical condition  Airway Management Planned: Natural Airway and Simple Face Mask  Additional Equipment: None  Intra-op Plan:   Post-operative Plan:   Informed Consent: I have reviewed the patients History and Physical, chart, labs and discussed the procedure including the risks, benefits and alternatives for the proposed anesthesia with the patient or authorized representative who has indicated his/her understanding and acceptance.       Plan Discussed with: CRNA and Anesthesiologist  Anesthesia  Plan Comments: (Adductor canal block. Spinal. Last dose of Xarelto 06/11/20. TIVA propofol gtt. On Prednisone. May need stress dose steroids. Norton Blizzard, MD     See PAT note by Karoline Caldwell, PA-C:  Follows with cardiology for history of moderate MR, permanent atrial fibrillation on Xarelto, HTN.  Last echo 06/23/2019 showed EF 55 to 60%, moderate MR, moderate AR.  Nuclear stress 05/15/2020 showed no ischemia, low risk.  She was cleared for surgery per telephone encounter 05/17/2020, "Chart reviewed as part of pre-operative protocol coverage. Given past medical history and time since last visit, based on ACC/AHA guidelines, Tracy Johnston would be at acceptable risk for the planned procedure without further cardiovascular testing. Recent nuclear stress test was low risk.  Patient is cleared. The patient was advised that if she develops new symptoms prior to surgery to contact our office to arrange for a follow-up visit, and she verbalized understanding. Our clinical pharmacist recommended the patient hold Xarelto for 3 days prior to the procedure and restart as soon as possible afterward at the surgeon's discretion."  History of COPD followed by PCP Dr. Huel Cote at [facility], maintained on Trelegy and as needed DuoNeb.  She was last seen 06/07/2020 and at that time was having a mild COPD exacerbation and her upcoming surgery was discussed.  Per note, "Pt left orthopedic's office today in preparation of surgery. She was medically cleared last visit in 04/2020. Pt presented in acute COPD Exacerbation. She was asked to come to the office for antibiotics today. Pt says she gets this every year at least 2x a year. Pt is well known to me  and this occurs at least once a year. She has slight wheezing but says her SOB is stable. Coughing up some yellowish phlegm."  She was prescribed a 7-day course of levofloxacin and prednisone Dosepak.  Patient denied shortness of breath, fever, cough at preop appointment 06/14/2019.  Preop labs  reviewed, unremarkable.  EKG 12/19/2019: Atrial fibrillation with a competing junctional pacemaker.  ST and T wave abnormalities, consider inferolateral ischemia.  Rate 79.  Nuclear stress 05/15/2020: There was no ST segment deviation noted during stress. The study is normal. There are no perfusion defects consistent with prior infarct or current ischemia. This is a low risk study. The left ventricular ejection fraction is normal (55-65%).  TTE 06/23/2019: 1. Left ventricular ejection fraction, by estimation, is 55 to 60%. The  left ventricle has normal function. The left ventricle has no regional  wall motion abnormalities. Left ventricular diastolic parameters were  normal.  2. Right ventricular systolic function is normal. The right ventricular  size is normal. There is normal pulmonary artery systolic pressure.  3. Left atrial size was severely dilated.  4. Right atrial size was mildly dilated.  5. The mitral valve is normal in structure. Moderate mitral valve  regurgitation. No evidence of mitral stenosis.  6. The aortic valve is tricuspid. Aortic valve regurgitation is moderate.  No aortic stenosis is present.  7. The inferior vena cava is normal in size with greater than 50%  respiratory variability, suggesting right atrial pressure of 3 mmHg.   Comparison(s): Echocardiogram done 12/08/18 showed an EF of 55-60% with  moderate to severe AI.   )     Anesthesia Quick Evaluation

## 2020-06-15 ENCOUNTER — Inpatient Hospital Stay (HOSPITAL_COMMUNITY)
Admission: AD | Admit: 2020-06-15 | Discharge: 2020-06-28 | DRG: 470 | Disposition: A | Discharge status: Home Health | Payer: Medicare HMO | Attending: Orthopaedic Surgery | Admitting: Orthopaedic Surgery

## 2020-06-15 ENCOUNTER — Other Ambulatory Visit: Payer: Self-pay

## 2020-06-15 ENCOUNTER — Encounter (HOSPITAL_COMMUNITY): Payer: Self-pay | Admitting: Orthopaedic Surgery

## 2020-06-15 ENCOUNTER — Encounter (HOSPITAL_COMMUNITY)
Admission: AD | Disposition: A | Discharge status: Home Health | Payer: Self-pay | Source: Home / Self Care | Attending: Orthopaedic Surgery

## 2020-06-15 ENCOUNTER — Ambulatory Visit (HOSPITAL_COMMUNITY): Payer: Medicare HMO | Admitting: Physician Assistant

## 2020-06-15 ENCOUNTER — Ambulatory Visit (HOSPITAL_COMMUNITY): Payer: Medicare HMO | Admitting: Anesthesiology

## 2020-06-15 DIAGNOSIS — D72828 Other elevated white blood cell count: Secondary | ICD-10-CM | POA: Diagnosis not present

## 2020-06-15 DIAGNOSIS — K5900 Constipation, unspecified: Secondary | ICD-10-CM

## 2020-06-15 DIAGNOSIS — Z85528 Personal history of other malignant neoplasm of kidney: Secondary | ICD-10-CM

## 2020-06-15 DIAGNOSIS — Z79899 Other long term (current) drug therapy: Secondary | ICD-10-CM

## 2020-06-15 DIAGNOSIS — M109 Gout, unspecified: Secondary | ICD-10-CM | POA: Diagnosis present

## 2020-06-15 DIAGNOSIS — Z96652 Presence of left artificial knee joint: Secondary | ICD-10-CM

## 2020-06-15 DIAGNOSIS — R062 Wheezing: Secondary | ICD-10-CM

## 2020-06-15 DIAGNOSIS — D571 Sickle-cell disease without crisis: Secondary | ICD-10-CM | POA: Diagnosis present

## 2020-06-15 DIAGNOSIS — Z87891 Personal history of nicotine dependence: Secondary | ICD-10-CM

## 2020-06-15 DIAGNOSIS — Z20822 Contact with and (suspected) exposure to covid-19: Secondary | ICD-10-CM | POA: Diagnosis present

## 2020-06-15 DIAGNOSIS — Z90710 Acquired absence of both cervix and uterus: Secondary | ICD-10-CM

## 2020-06-15 DIAGNOSIS — I10 Essential (primary) hypertension: Secondary | ICD-10-CM | POA: Diagnosis present

## 2020-06-15 DIAGNOSIS — Z7901 Long term (current) use of anticoagulants: Secondary | ICD-10-CM

## 2020-06-15 DIAGNOSIS — Z6835 Body mass index (BMI) 35.0-35.9, adult: Secondary | ICD-10-CM

## 2020-06-15 DIAGNOSIS — M1712 Unilateral primary osteoarthritis, left knee: Secondary | ICD-10-CM | POA: Diagnosis not present

## 2020-06-15 DIAGNOSIS — Z905 Acquired absence of kidney: Secondary | ICD-10-CM

## 2020-06-15 DIAGNOSIS — Z96659 Presence of unspecified artificial knee joint: Secondary | ICD-10-CM

## 2020-06-15 DIAGNOSIS — E785 Hyperlipidemia, unspecified: Secondary | ICD-10-CM | POA: Diagnosis present

## 2020-06-15 DIAGNOSIS — G894 Chronic pain syndrome: Secondary | ICD-10-CM | POA: Diagnosis present

## 2020-06-15 DIAGNOSIS — Z7951 Long term (current) use of inhaled steroids: Secondary | ICD-10-CM

## 2020-06-15 DIAGNOSIS — K439 Ventral hernia without obstruction or gangrene: Secondary | ICD-10-CM | POA: Diagnosis present

## 2020-06-15 DIAGNOSIS — Z96643 Presence of artificial hip joint, bilateral: Secondary | ICD-10-CM | POA: Diagnosis present

## 2020-06-15 DIAGNOSIS — I482 Chronic atrial fibrillation, unspecified: Secondary | ICD-10-CM | POA: Diagnosis present

## 2020-06-15 DIAGNOSIS — J449 Chronic obstructive pulmonary disease, unspecified: Secondary | ICD-10-CM | POA: Diagnosis present

## 2020-06-15 DIAGNOSIS — R1011 Right upper quadrant pain: Secondary | ICD-10-CM

## 2020-06-15 HISTORY — PX: TOTAL KNEE ARTHROPLASTY: SHX125

## 2020-06-15 SURGERY — ARTHROPLASTY, KNEE, TOTAL
Anesthesia: Monitor Anesthesia Care | Site: Knee | Laterality: Left

## 2020-06-15 MED ORDER — BUPIVACAINE IN DEXTROSE 0.75-8.25 % IT SOLN
INTRATHECAL | Status: DC | PRN
  Administered 2020-06-15: 1.6 mL via INTRATHECAL

## 2020-06-15 MED ORDER — PROPOFOL 10 MG/ML IV BOLUS
INTRAVENOUS | Status: AC
  Filled 2020-06-15: qty 20

## 2020-06-15 MED ORDER — FENTANYL CITRATE (PF) 250 MCG/5ML IJ SOLN
INTRAMUSCULAR | Status: DC | PRN
  Administered 2020-06-15: 50 ug via INTRAVENOUS

## 2020-06-15 MED ORDER — ACETAMINOPHEN 500 MG PO TABS
500.0000 mg | ORAL_TABLET | Freq: Four times a day (QID) | ORAL | Status: AC
  Administered 2020-06-15 – 2020-06-16 (×4): 500 mg via ORAL
  Filled 2020-06-15 (×4): qty 1

## 2020-06-15 MED ORDER — LACTATED RINGERS IV SOLN: INTRAVENOUS | Status: DC

## 2020-06-15 MED ORDER — PHENOL 1.4 % MT LIQD: 1.0000 | OROMUCOSAL | Status: DC | PRN

## 2020-06-15 MED ORDER — SIMVASTATIN 20 MG PO TABS
20.0000 mg | ORAL_TABLET | Freq: Every day | ORAL | Status: DC
  Administered 2020-06-15 – 2020-06-28 (×14): 20 mg via ORAL
  Filled 2020-06-15 (×14): qty 1

## 2020-06-15 MED ORDER — PHENYLEPHRINE HCL-NACL 10-0.9 MG/250ML-% IV SOLN
INTRAVENOUS | Status: DC | PRN
  Administered 2020-06-15: 25 ug/min via INTRAVENOUS

## 2020-06-15 MED ORDER — FERROUS SULFATE 325 (65 FE) MG PO TABS
325.0000 mg | ORAL_TABLET | Freq: Three times a day (TID) | ORAL | Status: DC
  Administered 2020-06-16 – 2020-06-28 (×23): 325 mg via ORAL
  Filled 2020-06-15 (×25): qty 1

## 2020-06-15 MED ORDER — CEFAZOLIN SODIUM-DEXTROSE 2-4 GM/100ML-% IV SOLN
2.0000 g | INTRAVENOUS | Status: AC
  Administered 2020-06-15: 2 g via INTRAVENOUS
  Filled 2020-06-15: qty 100

## 2020-06-15 MED ORDER — MIDAZOLAM HCL 2 MG/2ML IJ SOLN
INTRAMUSCULAR | Status: DC | PRN
  Administered 2020-06-15 (×2): .5 mg via INTRAVENOUS

## 2020-06-15 MED ORDER — ALLOPURINOL 300 MG PO TABS
300.0000 mg | ORAL_TABLET | Freq: Every day | ORAL | Status: DC
  Administered 2020-06-16 – 2020-06-28 (×13): 300 mg via ORAL
  Filled 2020-06-15 (×13): qty 1

## 2020-06-15 MED ORDER — MENTHOL 3 MG MT LOZG: 1.0000 | LOZENGE | OROMUCOSAL | Status: DC | PRN

## 2020-06-15 MED ORDER — ALBUTEROL SULFATE (2.5 MG/3ML) 0.083% IN NEBU
2.5000 mg | INHALATION_SOLUTION | Freq: Once | RESPIRATORY_TRACT | Status: AC
  Administered 2020-06-15: 2.5 mg via RESPIRATORY_TRACT

## 2020-06-15 MED ORDER — MONTELUKAST SODIUM 10 MG PO TABS
10.0000 mg | ORAL_TABLET | Freq: Every day | ORAL | Status: DC
  Administered 2020-06-15 – 2020-06-27 (×13): 10 mg via ORAL
  Filled 2020-06-15 (×13): qty 1

## 2020-06-15 MED ORDER — OXYCODONE HCL 5 MG PO TABS: 5.0000 mg | ORAL_TABLET | Freq: Once | ORAL | Status: DC | PRN

## 2020-06-15 MED ORDER — HYDROCODONE-ACETAMINOPHEN 7.5-325 MG PO TABS
1.0000 | ORAL_TABLET | ORAL | Status: DC | PRN
  Administered 2020-06-15 – 2020-06-16 (×6): 2 via ORAL
  Administered 2020-06-16: 1 via ORAL
  Administered 2020-06-17 – 2020-06-18 (×5): 2 via ORAL
  Administered 2020-06-19: 1 via ORAL
  Administered 2020-06-19: 2 via ORAL
  Administered 2020-06-19: 1 via ORAL
  Administered 2020-06-19 – 2020-06-27 (×18): 2 via ORAL
  Administered 2020-06-28: 1 via ORAL
  Filled 2020-06-15 (×7): qty 2
  Filled 2020-06-15: qty 1
  Filled 2020-06-15 (×6): qty 2
  Filled 2020-06-15: qty 1
  Filled 2020-06-15 (×5): qty 2
  Filled 2020-06-15: qty 1
  Filled 2020-06-15 (×14): qty 2

## 2020-06-15 MED ORDER — MORPHINE SULFATE (PF) 2 MG/ML IV SOLN
0.5000 mg | INTRAVENOUS | Status: DC | PRN
  Administered 2020-06-16 (×2): 1 mg via INTRAVENOUS
  Filled 2020-06-15 (×2): qty 1

## 2020-06-15 MED ORDER — MIRABEGRON ER 50 MG PO TB24
50.0000 mg | ORAL_TABLET | Freq: Every day | ORAL | Status: DC
  Administered 2020-06-16 – 2020-06-28 (×13): 50 mg via ORAL
  Filled 2020-06-15 (×14): qty 1

## 2020-06-15 MED ORDER — ONDANSETRON HCL 4 MG/2ML IJ SOLN: 4.0000 mg | Freq: Four times a day (QID) | INTRAMUSCULAR | Status: DC | PRN

## 2020-06-15 MED ORDER — DIPHENHYDRAMINE HCL 12.5 MG/5ML PO ELIX: 12.5000 mg | ORAL_SOLUTION | ORAL | Status: DC | PRN

## 2020-06-15 MED ORDER — TRANEXAMIC ACID-NACL 1000-0.7 MG/100ML-% IV SOLN
INTRAVENOUS | Status: DC | PRN
  Administered 2020-06-15: 1000 mg via INTRAVENOUS

## 2020-06-15 MED ORDER — FENTANYL CITRATE (PF) 250 MCG/5ML IJ SOLN
INTRAMUSCULAR | Status: AC
  Filled 2020-06-15: qty 5

## 2020-06-15 MED ORDER — BUPIVACAINE-EPINEPHRINE (PF) 0.25% -1:200000 IJ SOLN
INTRAMUSCULAR | Status: DC | PRN
  Administered 2020-06-15: 20 mL via PERINEURAL

## 2020-06-15 MED ORDER — IRBESARTAN 300 MG PO TABS
300.0000 mg | ORAL_TABLET | Freq: Every day | ORAL | Status: DC
  Administered 2020-06-16 – 2020-06-28 (×13): 300 mg via ORAL
  Filled 2020-06-15 (×13): qty 1

## 2020-06-15 MED ORDER — DOCUSATE SODIUM 100 MG PO CAPS
100.0000 mg | ORAL_CAPSULE | Freq: Two times a day (BID) | ORAL | Status: DC
  Administered 2020-06-15 – 2020-06-28 (×25): 100 mg via ORAL
  Filled 2020-06-15 (×26): qty 1

## 2020-06-15 MED ORDER — FENTANYL CITRATE (PF) 100 MCG/2ML IJ SOLN: 25.0000 ug | INTRAMUSCULAR | Status: DC | PRN

## 2020-06-15 MED ORDER — MIDAZOLAM HCL 2 MG/2ML IJ SOLN
INTRAMUSCULAR | Status: AC
  Filled 2020-06-15: qty 2

## 2020-06-15 MED ORDER — FLUTICASONE FUROATE-VILANTEROL 100-25 MCG/INH IN AEPB
1.0000 | INHALATION_SPRAY | Freq: Every day | RESPIRATORY_TRACT | Status: DC
  Administered 2020-06-27: 1 via RESPIRATORY_TRACT
  Filled 2020-06-15: qty 28

## 2020-06-15 MED ORDER — MORPHINE SULFATE 15 MG PO TABS
30.0000 mg | ORAL_TABLET | Freq: Four times a day (QID) | ORAL | Status: DC | PRN
  Administered 2020-06-17 – 2020-06-25 (×9): 30 mg via ORAL
  Filled 2020-06-15 (×11): qty 2

## 2020-06-15 MED ORDER — SODIUM CHLORIDE 0.9 % IR SOLN
Status: DC | PRN
  Administered 2020-06-15: 3000 mL

## 2020-06-15 MED ORDER — METOCLOPRAMIDE HCL 5 MG PO TABS
5.0000 mg | ORAL_TABLET | Freq: Three times a day (TID) | ORAL | Status: DC | PRN
  Administered 2020-06-24: 10 mg via ORAL
  Filled 2020-06-15: qty 2

## 2020-06-15 MED ORDER — ALBUTEROL SULFATE (2.5 MG/3ML) 0.083% IN NEBU
INHALATION_SOLUTION | RESPIRATORY_TRACT | Status: AC
  Filled 2020-06-15: qty 3

## 2020-06-15 MED ORDER — OLMESARTAN MEDOXOMIL-HCTZ 40-12.5 MG PO TABS: 1.0000 | ORAL_TABLET | Freq: Every day | ORAL | Status: DC

## 2020-06-15 MED ORDER — AMISULPRIDE (ANTIEMETIC) 5 MG/2ML IV SOLN: 10.0000 mg | Freq: Once | INTRAVENOUS | Status: DC | PRN

## 2020-06-15 MED ORDER — POLYETHYLENE GLYCOL 3350 17 G PO PACK
17.0000 g | PACK | ORAL | Status: DC
  Administered 2020-06-15 – 2020-06-24 (×6): 17 g via ORAL
  Filled 2020-06-15 (×9): qty 1

## 2020-06-15 MED ORDER — SODIUM CHLORIDE 0.9 % IV SOLN: INTRAVENOUS | Status: DC

## 2020-06-15 MED ORDER — BUPIVACAINE HCL (PF) 0.5 % IJ SOLN
INTRAMUSCULAR | Status: DC | PRN
  Administered 2020-06-15: 30 mL via PERINEURAL

## 2020-06-15 MED ORDER — DILTIAZEM HCL ER COATED BEADS 120 MG PO CP24
240.0000 mg | ORAL_CAPSULE | Freq: Every day | ORAL | Status: DC
  Administered 2020-06-16 – 2020-06-21 (×6): 240 mg via ORAL
  Filled 2020-06-15 (×6): qty 2

## 2020-06-15 MED ORDER — RIVAROXABAN 20 MG PO TABS
20.0000 mg | ORAL_TABLET | Freq: Every day | ORAL | Status: DC
  Administered 2020-06-16 – 2020-06-18 (×3): 20 mg via ORAL
  Filled 2020-06-15 (×3): qty 1

## 2020-06-15 MED ORDER — PROPOFOL 500 MG/50ML IV EMUL
INTRAVENOUS | Status: DC | PRN
  Administered 2020-06-15: 100 ug/kg/min via INTRAVENOUS

## 2020-06-15 MED ORDER — PROMETHAZINE HCL 25 MG/ML IJ SOLN: 6.2500 mg | INTRAMUSCULAR | Status: DC | PRN

## 2020-06-15 MED ORDER — BUPIVACAINE LIPOSOME 1.3 % IJ SUSP
INTRAMUSCULAR | Status: DC | PRN
  Administered 2020-06-15: 20 mL

## 2020-06-15 MED ORDER — ORAL CARE MOUTH RINSE: 15.0000 mL | Freq: Once | OROMUCOSAL | Status: AC

## 2020-06-15 MED ORDER — BUPIVACAINE-EPINEPHRINE (PF) 0.25% -1:200000 IJ SOLN
INTRAMUSCULAR | Status: AC
  Filled 2020-06-15: qty 30

## 2020-06-15 MED ORDER — SODIUM CHLORIDE 0.9 % IV SOLN: INTRAVENOUS | Status: DC | PRN

## 2020-06-15 MED ORDER — METOCLOPRAMIDE HCL 5 MG/ML IJ SOLN: 5.0000 mg | Freq: Three times a day (TID) | INTRAMUSCULAR | Status: DC | PRN

## 2020-06-15 MED ORDER — UMECLIDINIUM BROMIDE 62.5 MCG/INH IN AEPB
1.0000 | INHALATION_SPRAY | Freq: Every day | RESPIRATORY_TRACT | Status: DC
  Administered 2020-06-27: 1 via RESPIRATORY_TRACT
  Filled 2020-06-15: qty 7

## 2020-06-15 MED ORDER — IPRATROPIUM-ALBUTEROL 0.5-2.5 (3) MG/3ML IN SOLN
3.0000 mL | Freq: Four times a day (QID) | RESPIRATORY_TRACT | Status: DC | PRN
  Administered 2020-06-16 – 2020-06-17 (×2): 3 mL via RESPIRATORY_TRACT
  Filled 2020-06-15 (×2): qty 3

## 2020-06-15 MED ORDER — PREDNISONE 20 MG PO TABS
20.0000 mg | ORAL_TABLET | Freq: Every day | ORAL | Status: DC | PRN
  Administered 2020-06-21 – 2020-06-23 (×2): 20 mg via ORAL
  Filled 2020-06-15 (×2): qty 1

## 2020-06-15 MED ORDER — OXYCODONE HCL 5 MG/5ML PO SOLN: 5.0000 mg | Freq: Once | ORAL | Status: DC | PRN

## 2020-06-15 MED ORDER — CHLORHEXIDINE GLUCONATE 0.12 % MT SOLN
15.0000 mL | Freq: Once | OROMUCOSAL | Status: AC
  Administered 2020-06-15: 15 mL via OROMUCOSAL
  Filled 2020-06-15: qty 15

## 2020-06-15 MED ORDER — 0.9 % SODIUM CHLORIDE (POUR BTL) OPTIME
TOPICAL | Status: DC | PRN
  Administered 2020-06-15: 1000 mL

## 2020-06-15 MED ORDER — ONDANSETRON HCL 4 MG PO TABS
4.0000 mg | ORAL_TABLET | Freq: Four times a day (QID) | ORAL | Status: DC | PRN
  Administered 2020-06-23 – 2020-06-24 (×3): 4 mg via ORAL
  Filled 2020-06-15 (×2): qty 1

## 2020-06-15 MED ORDER — ALBUTEROL SULFATE HFA 108 (90 BASE) MCG/ACT IN AERS
2.0000 | INHALATION_SPRAY | RESPIRATORY_TRACT | Status: DC | PRN
  Administered 2020-06-27: 2 via RESPIRATORY_TRACT
  Filled 2020-06-15: qty 6.7

## 2020-06-15 MED ORDER — HYDROCHLOROTHIAZIDE 12.5 MG PO CAPS
12.5000 mg | ORAL_CAPSULE | Freq: Every day | ORAL | Status: DC
  Administered 2020-06-16 – 2020-06-28 (×13): 12.5 mg via ORAL
  Filled 2020-06-15 (×13): qty 1

## 2020-06-15 MED ORDER — MORPHINE SULFATE 15 MG PO TABS: 30.0000 mg | ORAL_TABLET | Freq: Four times a day (QID) | ORAL | Status: DC | PRN

## 2020-06-15 MED ORDER — BUPIVACAINE LIPOSOME 1.3 % IJ SUSP
INTRAMUSCULAR | Status: AC
  Filled 2020-06-15: qty 20

## 2020-06-15 MED ORDER — ACETAMINOPHEN 325 MG PO TABS: 325.0000 mg | ORAL_TABLET | Freq: Four times a day (QID) | ORAL | Status: DC | PRN

## 2020-06-15 MED ORDER — TRANEXAMIC ACID-NACL 1000-0.7 MG/100ML-% IV SOLN
INTRAVENOUS | Status: AC
  Filled 2020-06-15: qty 100

## 2020-06-15 MED ORDER — FLUTICASONE-UMECLIDIN-VILANT 100-62.5-25 MCG/INH IN AEPB: 1.0000 | INHALATION_SPRAY | Freq: Every day | RESPIRATORY_TRACT | Status: DC

## 2020-06-15 MED ORDER — ACETAMINOPHEN 500 MG PO TABS
1000.0000 mg | ORAL_TABLET | Freq: Once | ORAL | Status: AC
  Administered 2020-06-15: 1000 mg via ORAL
  Filled 2020-06-15: qty 2

## 2020-06-15 MED ORDER — PROPOFOL 10 MG/ML IV BOLUS
INTRAVENOUS | Status: DC | PRN
  Administered 2020-06-15: 10 mg via INTRAVENOUS
  Administered 2020-06-15 (×2): 20 mg via INTRAVENOUS

## 2020-06-15 SURGICAL SUPPLY — 81 items
APL SKNCLS STERI-STRIP NONHPOA (GAUZE/BANDAGES/DRESSINGS) ×1
ATTUNE PS FEM LT SZ 5 CEM KNEE (Femur) ×1 IMPLANT
ATTUNE PSRP INSR SZ5 5 KNEE (Insert) ×1 IMPLANT
BANDAGE ESMARK 6X9 LF (GAUZE/BANDAGES/DRESSINGS) ×1 IMPLANT
BASE TIBIAL ROT PLAT SZ 5 KNEE (Knees) IMPLANT
BENZOIN TINCTURE PRP APPL 2/3 (GAUZE/BANDAGES/DRESSINGS) ×2 IMPLANT
BLADE SAGITTAL 25.0X1.19X90 (BLADE) ×2 IMPLANT
BLADE SAW SGTL 13X75X1.27 (BLADE) ×2 IMPLANT
BNDG CMPR 9X6 STRL LF SNTH (GAUZE/BANDAGES/DRESSINGS) ×1
BNDG ELASTIC 4X5.8 VLCR STR LF (GAUZE/BANDAGES/DRESSINGS) ×2 IMPLANT
BNDG ELASTIC 6X5.8 VLCR STR LF (GAUZE/BANDAGES/DRESSINGS) ×1 IMPLANT
BNDG ESMARK 6X9 LF (GAUZE/BANDAGES/DRESSINGS) ×2
BOWL SMART MIX CTS (DISPOSABLE) ×2 IMPLANT
BSPLAT TIB 5 CMNT ROT PLAT STR (Knees) ×1 IMPLANT
CEMENT HV SMART SET (Cement) ×4 IMPLANT
COVER SURGICAL LIGHT HANDLE (MISCELLANEOUS) ×2 IMPLANT
COVER WAND RF STERILE (DRAPES) ×2 IMPLANT
CUFF TOURN SGL QUICK 34 (TOURNIQUET CUFF) ×2
CUFF TOURN SGL QUICK 42 (TOURNIQUET CUFF) IMPLANT
CUFF TRNQT CYL 34X4.125X (TOURNIQUET CUFF) ×1 IMPLANT
DRAPE ORTHO SPLIT 77X108 STRL (DRAPES) ×4
DRAPE SURG ORHT 6 SPLT 77X108 (DRAPES) ×2 IMPLANT
DRAPE U-SHAPE 47X51 STRL (DRAPES) ×2 IMPLANT
DRSG PAD ABDOMINAL 8X10 ST (GAUZE/BANDAGES/DRESSINGS) ×2 IMPLANT
DRSG XEROFORM 1X8 (GAUZE/BANDAGES/DRESSINGS) ×1 IMPLANT
DURAPREP 26ML APPLICATOR (WOUND CARE) ×4 IMPLANT
ELECT REM PT RETURN 9FT ADLT (ELECTROSURGICAL) ×2
ELECTRODE REM PT RTRN 9FT ADLT (ELECTROSURGICAL) ×1 IMPLANT
EVACUATOR 1/8 PVC DRAIN (DRAIN) IMPLANT
FACESHIELD WRAPAROUND (MASK) ×4 IMPLANT
FACESHIELD WRAPAROUND OR TEAM (MASK) ×2 IMPLANT
GAUZE SPONGE 4X4 12PLY STRL (GAUZE/BANDAGES/DRESSINGS) ×2 IMPLANT
GAUZE SPONGE 4X4 16PLY XRAY LF (GAUZE/BANDAGES/DRESSINGS) ×1 IMPLANT
GAUZE XEROFORM 5X9 LF (GAUZE/BANDAGES/DRESSINGS) ×2 IMPLANT
GLOVE ORTHO TXT STRL SZ7.5 (GLOVE) ×4 IMPLANT
GLOVE SRG 8 PF TXTR STRL LF DI (GLOVE) ×2 IMPLANT
GLOVE SURG UNDER POLY LF SZ8 (GLOVE) ×4
GOWN STRL REUS W/ TWL LRG LVL3 (GOWN DISPOSABLE) ×1 IMPLANT
GOWN STRL REUS W/ TWL XL LVL3 (GOWN DISPOSABLE) ×1 IMPLANT
GOWN STRL REUS W/TWL 2XL LVL3 (GOWN DISPOSABLE) ×2 IMPLANT
GOWN STRL REUS W/TWL LRG LVL3 (GOWN DISPOSABLE) ×2
GOWN STRL REUS W/TWL XL LVL3 (GOWN DISPOSABLE) ×2
HANDPIECE INTERPULSE COAX TIP (DISPOSABLE) ×2
IMMOBILIZER KNEE 20 (SOFTGOODS) ×2
IMMOBILIZER KNEE 20 THIGH 36 (SOFTGOODS) IMPLANT
IMMOBILIZER KNEE 22 UNIV (SOFTGOODS) ×2 IMPLANT
KIT BASIN OR (CUSTOM PROCEDURE TRAY) ×2 IMPLANT
KIT TURNOVER KIT B (KITS) ×2 IMPLANT
MANIFOLD NEPTUNE II (INSTRUMENTS) ×2 IMPLANT
MARKER SKIN DUAL TIP RULER LAB (MISCELLANEOUS) ×2 IMPLANT
NDL 18GX1X1/2 (RX/OR ONLY) (NEEDLE) ×1 IMPLANT
NDL HYPO 25GX1X1/2 BEV (NEEDLE) ×1 IMPLANT
NEEDLE 18GX1X1/2 (RX/OR ONLY) (NEEDLE) ×2 IMPLANT
NEEDLE HYPO 25GX1X1/2 BEV (NEEDLE) ×2 IMPLANT
NS IRRIG 1000ML POUR BTL (IV SOLUTION) ×2 IMPLANT
PACK TOTAL JOINT (CUSTOM PROCEDURE TRAY) ×2 IMPLANT
PAD ABD 8X10 STRL (GAUZE/BANDAGES/DRESSINGS) ×1 IMPLANT
PAD ARMBOARD 7.5X6 YLW CONV (MISCELLANEOUS) ×4 IMPLANT
PAD CAST 4YDX4 CTTN HI CHSV (CAST SUPPLIES) ×1 IMPLANT
PADDING CAST COTTON 4X4 STRL (CAST SUPPLIES) ×2
PADDING CAST COTTON 6X4 STRL (CAST SUPPLIES) ×2 IMPLANT
PATELLA MEDIAL ATTUN 35MM KNEE (Knees) ×1 IMPLANT
PIN DRILL FIX HALF THREAD (BIT) ×1 IMPLANT
PIN STEINMAN FIXATION KNEE (PIN) ×1 IMPLANT
SET HNDPC FAN SPRY TIP SCT (DISPOSABLE) ×1 IMPLANT
STAPLER VISISTAT 35W (STAPLE) IMPLANT
SUCTION FRAZIER HANDLE 10FR (MISCELLANEOUS) ×2
SUCTION TUBE FRAZIER 10FR DISP (MISCELLANEOUS) ×1 IMPLANT
SUT VIC AB 0 CT1 27 (SUTURE) ×2
SUT VIC AB 0 CT1 27XBRD ANBCTR (SUTURE) ×1 IMPLANT
SUT VIC AB 1 CTX 36 (SUTURE) ×4
SUT VIC AB 1 CTX36XBRD ANBCTR (SUTURE) ×2 IMPLANT
SUT VIC AB 2-0 CT1 27 (SUTURE) ×4
SUT VIC AB 2-0 CT1 TAPERPNT 27 (SUTURE) ×2 IMPLANT
SUT VIC AB 3-0 X1 27 (SUTURE) ×2 IMPLANT
SYR 50ML LL SCALE MARK (SYRINGE) ×2 IMPLANT
SYR CONTROL 10ML LL (SYRINGE) ×2 IMPLANT
TIBIAL BASE ROT PLAT SZ 5 KNEE (Knees) ×2 IMPLANT
TOWEL GREEN STERILE (TOWEL DISPOSABLE) ×2 IMPLANT
TOWEL GREEN STERILE FF (TOWEL DISPOSABLE) ×2 IMPLANT
TRAY CATH 16FR W/PLASTIC CATH (SET/KITS/TRAYS/PACK) IMPLANT

## 2020-06-15 NOTE — Anesthesia Procedure Notes (Addendum)
Anesthesia Regional Block: Popliteal block   Pre-Anesthetic Checklist: ,, timeout performed, Correct Patient, Correct Site, Correct Laterality, Correct Procedure, Correct Position, site marked, Risks and benefits discussed,  Surgical consent,  Pre-op evaluation,  At surgeon's request and post-op pain management  Laterality: Left  Prep: chloraprep       Needles:  Injection technique: Single-shot  Needle Type: Echogenic Stimulator Needle          Additional Needles:   Procedures:,,,, ultrasound used (permanent image in chart),,,,  Narrative:  Start time: 06/15/2020 7:00 AM End time: 06/15/2020 7:05 AM Injection made incrementally with aspirations every 5 mL.  Performed by: Personally  Anesthesiologist: Merlinda Frederick, MD  Additional Notes: A functioning IV was confirmed and monitors were applied.  Sterile prep and drape, hand hygiene and sterile gloves were used.  Negative aspiration and test dose prior to incremental administration of local anesthetic. The patient tolerated the procedure well.Ultrasound  guidance: relevant anatomy identified, needle position confirmed, local anesthetic spread visualized around nerve(s), vascular puncture avoided.  Image printed for medical record.

## 2020-06-15 NOTE — TOC Progression Note (Signed)
Transition of Care [facility]) - Progression Note    Patient Details  Name: Tracy Johnston MRN: [id number] Date of Birth: September 02, 1950  Transition of Care [facility]) CM/SW Contact  Milinda Antis, [place] Phone Number: 06/15/2020, 2:29 PM  Clinical Narrative:     CSW following patient for any d/c planning needs once medically stable.  Pending PT/OT recs.    Lind Covert, MSW, LCSWA       Expected Discharge Plan and Services                                                 Social Determinants of Health (SDOH) Interventions    Readmission Risk Interventions No flowsheet data found.

## 2020-06-15 NOTE — Anesthesia Procedure Notes (Signed)
Procedure Name: MAC Date/Time: 06/15/2020 7:39 AM Performed by: Barrington Ellison, CRNA Pre-anesthesia Checklist: Patient identified, Emergency Drugs available, Suction available, Patient being monitored and Timeout performed Patient Re-evaluated:Patient Re-evaluated prior to induction Oxygen Delivery Method: Simple face mask

## 2020-06-15 NOTE — Evaluation (Signed)
Physical Therapy Evaluation Patient Details Name: Tracy Johnston MRN: [id number] DOB: Jul 03, 1950 Today's Date: 06/15/2020   History of Present Illness  Pt is a 70 y/o female s/p L TKA. PMH includes COPD, cervical cancer, a fib, HTN, tobbacco use, and bilateral THA.  Clinical Impression  Pt is s/p surgery above with deficits below. Pt very limited secondary to pain this session. Was only agreeable to rolling so that pad could be changed as it was wet. PT attempted to change bed sheet as well, however, pt adamantly refusing and stated that could be done tomorrow. Notified NT. Reviewed knee precautions with pt. Pt reports the plan is to d/c home with assist from her aide. Will continue to follow acutely.     Follow Up Recommendations Follow surgeon's recommendation for DC plan and follow-up therapies;Supervision/Assistance - 24 hour    Equipment Recommendations  Rolling walker with 5" wheels    Recommendations for Other Services       Precautions / Restrictions Precautions Precautions: Knee Precaution Booklet Issued: No Precaution Comments: Verbally reviewed knee precautions with pt. Restrictions Weight Bearing Restrictions: Yes LLE Weight Bearing: Weight bearing as tolerated      Mobility  Bed Mobility Overal bed mobility: Needs Assistance Bed Mobility: Rolling Rolling: Supervision (with use of bed rails.)         General bed mobility comments: Supervision for safety to roll from side to side to replace bed pad. Noted sheet was wet as well, but pt adamantly refusing and only wanting pad replaced. Notified NT and RN. Pt refusing further mobility.    Transfers                    Ambulation/Gait                Stairs            Wheelchair Mobility    Modified Rankin (Stroke Patients Only)       Balance                                             Pertinent Vitals/Pain Pain Assessment: Faces Faces Pain Scale: Hurts whole  lot Pain Location: L knee Pain Descriptors / Indicators: Grimacing;Guarding;Moaning Pain Intervention(s): Limited activity within patient's tolerance;Monitored during session;Repositioned    Home Living Family/patient expects to be discharged to:: Private residence Living Arrangements: Alone Available Help at Discharge: Personal care attendant;Available PRN/intermittently Type of Home: House Home Access: Level entry     Home Layout: One level Home Equipment: Cane - single point;Wheelchair - [employer];Shower seat Additional Comments: Aide come M-F for 8 hours a day    Prior Function Level of Independence: Needs assistance   Gait / Transfers Assistance Needed: Has been using WC for mobility. Is independent with transfers using cane  ADL's / Homemaking Assistance Needed: Aide assists with bathing/dressing tasks.        Hand Dominance        Extremity/Trunk Assessment   Upper Extremity Assessment Upper Extremity Assessment: Defer to OT evaluation    Lower Extremity Assessment Lower Extremity Assessment: LLE deficits/detail LLE Deficits / Details: Very limited secondary to pain. Could not tolerate any movement and would yell out in pain.       Communication   Communication: No difficulties  Cognition Arousal/Alertness: Awake/alert Behavior During Therapy: WFL for tasks assessed/performed Overall Cognitive Status: Within Functional Limits  for tasks assessed                                        General Comments      Exercises     Assessment/Plan    PT Assessment Patient needs continued PT services  PT Problem List Decreased strength;Decreased range of motion;Decreased activity tolerance;Decreased balance;Decreased mobility;Decreased knowledge of use of DME;Decreased knowledge of precautions;Pain       PT Treatment Interventions DME instruction;Gait training;Functional mobility training;Therapeutic activities;Therapeutic  exercise;Balance training;Patient/family education    PT Goals (Current goals can be found in the Care Plan section)  Acute Rehab PT Goals Patient Stated Goal: to decrease pain PT Goal Formulation: With patient Time For Goal Achievement: 06/29/20 Potential to Achieve Goals: Fair    Frequency 7X/week   Barriers to discharge        Co-evaluation               AM-PAC PT "6 Clicks" Mobility  Outcome Measure Help needed turning from your back to your side while in a flat bed without using bedrails?: A Little Help needed moving from lying on your back to sitting on the side of a flat bed without using bedrails?: A Lot Help needed moving to and from a bed to a chair (including a wheelchair)?: A Lot Help needed standing up from a chair using your arms (e.g., wheelchair or bedside chair)?: A Lot Help needed to walk in hospital room?: A Lot Help needed climbing 3-5 steps with a railing? : Total 6 Click Score: 12    End of Session   Activity Tolerance: Patient limited by pain Patient left: in bed;with call bell/phone within reach Nurse Communication: Mobility status;Patient requests pain meds PT Visit Diagnosis: Unsteadiness on feet (R26.81);Difficulty in walking, not elsewhere classified (R26.2);Pain Pain - Right/Left: Left Pain - part of body: Knee    Time: 1478-2956 PT Time Calculation (min) (ACUTE ONLY): 27 min   Charges:   PT Evaluation $PT Eval Low Complexity: 1 Low PT Treatments $Therapeutic Activity: 8-22 mins        [name], DPT  Acute Rehabilitation Services  Pager: [phone] Office: [phone]   Tracy Johnston 06/15/2020, 4:50 PM

## 2020-06-15 NOTE — Interval H&P Note (Signed)
History and Physical Interval Note:  06/15/2020 7:25 AM  Tracy Johnston  has presented today for surgery, with the diagnosis of left knee osteoarthritis.  The various methods of treatment have been discussed with the patient and family. After consideration of risks, benefits and other options for treatment, the patient has consented to  Procedure(s) with comments: LEFT TOTAL KNEE ARTHROPLASTY (Left) - Needs RNFA as a surgical intervention.  The patient's history has been reviewed, patient examined, no change in status, stable for surgery.  I have reviewed the patient's chart and labs.  Questions were answered to the patient's satisfaction.     [name]

## 2020-06-15 NOTE — Transfer of Care (Signed)
Immediate Anesthesia Transfer of Care Note  Patient: Tracy Johnston  Procedure(s) Performed: LEFT TOTAL KNEE ARTHROPLASTY (Left Knee)  Patient Location: PACU  Anesthesia Type:Regional and Spinal  Level of Consciousness: awake, alert  and oriented  Airway & Oxygen Therapy: Patient Spontanous Breathing and Patient connected to nasal cannula oxygen  Post-op Assessment: Report given to RN  Post vital signs: Reviewed and stable  Last Vitals:  Vitals Value Taken Time  BP 105/57 06/15/20 0945  Temp    Pulse 65 06/15/20 0945  Resp 22 06/15/20 0945  SpO2 98 % 06/15/20 0945  Vitals shown include unvalidated device data.  Last Pain:  Vitals:   06/15/20 0609  TempSrc:   PainSc: 3       Patients Stated Pain Goal: 1 (84/66/59 9357)  Complications: No complications documented.

## 2020-06-15 NOTE — Op Note (Addendum)
Preop diagnosis: Left knee primary osteoarthritis  Postop diagnosis: Same  Procedure: Left total knee arthroplasty  Surgeon: Lorin Mercy MD  Anesthesia preoperative block plus spinal +20 Marcaine +20 cc Exparel at tourniquet deflation.  Assistant: Wandra Arthurs, RNFA  Tourniquet: 300 x 47 minutes.  Implants:CEMENT HV SMART SET - [id number]  Inventory Item: CEMENT HV SMART SET Serial no.:  Model/Cat no.: [id number]  Implant name: CEMENT HV SMART SET - [id number] Laterality: Left Area: Knee  Manufacturer: [place] Date of Manufacture:    Action: Implanted Number Used: 2   Device Identifier:  Device Identifier Type:     TIBIAL BASE ROT PLAT SZ 5 KNEE - [id number]  Inventory Item: TIBIAL BASE ROT PLAT SZ 5 KNEE Serial no.:  Model/Cat no.: [id number]  Implant name: TIBIAL BASE ROT PLAT SZ 5 KNEE - [id number] Laterality: Left Area: Knee  Manufacturer: [employer] Date of Manufacture:    Action: Implanted Number Used: 1   Device Identifier:  Device Identifier Type:     ATTUNE PSRP INSR SZ5 5MM KNEE - [id number]  Inventory Item: ATTUNE PSRP INSR SZ5 5MM KNEE Serial no.:  Model/Cat no.: [id number]  Implant name: ATTUNE PSRP INSR SZ5 5MM KNEE - [id number] Laterality: Left Area: Knee  Manufacturer: [place] Date of Manufacture:    Action: Implanted Number Used: 1   Device Identifier:  Device Identifier Type:     ATTUNE PS FEM LT SZ 5 CEM KNEE - [id number]  Inventory Item: ATTUNE PS FEM LT SZ 5 CEM KNEE Serial no.:  Model/Cat no.: [id number]  Implant name: ATTUNE PS FEM LT SZ 5 CEM KNEE - [id number] Laterality: Left Area: Knee  Manufacturer: [employer] Date of Manufacture:    Action: Implanted Number Used: 1   Device Identifier:  Device Identifier Type:     PATELLA MEDIAL ATTUN 35MM KNEE - [id number]  Inventory Item: PATELLA MEDIAL ATTUN 35MM KNEE Serial no.:  Model/Cat no.: [id number]  Implant name: PATELLA MEDIAL ATTUN 35MM KNEE - [id number] Laterality: Left Area: Knee   Manufacturer: [employer] Date of Manufacture:    Action: Implanted Number Used: 1   Device Identifier:  Device Identifier Type:     Procedure: After standard prepping draping with spinal anesthesia proximal thigh tourniquet heel bump lateral post for the left knee DuraPrep was used from the tourniquet the tip of the toes.  Usual total knee sheets drapes impervious stockinette Coban sterile skin marker Betadine Steri-Drape were applied with split sheets and drapes in the usual fashion.  Preoperative Ancef was given.  TXA was given IV at the time of tourniquet deflation at the end of implantation.  After timeout procedure legs wrapped in Esmarch tourniquet inflated 300.  Midline incision was made medial patellar retinacular incision was made patella was cut 10 mm.  Intramedullary hole  Was drilled in the femur.  There was a geographic deep crevice in the medial femoral condyle that extended 4 to 5 mm down with exposed subchondral bone.  It was a wet regular and somewhat Y-shaped.  There was no avascular segment to it.  There was grade 3 and 4 lateral femoral condyle changes.  10 mm cut off the distal femur 10 off the proximal tibia using external alignment guide for the tibia.  Chamfer cuts were made after sizing for a #5.  Tibia was sized #5.  Additional 2 mm were taken off the tibia and a 5 mm spacer block would fit him with full extension.  Box cut was made on the femur after  keel preparation on the tibia pulse lavage resection of all menisci taking posterior spurs off three-quarter curved osteotome.  Cement was vacuum mixed tibia cemented first followed by femur placement of the permanent poly with full extension all cement excessive amounts were removed.  Patella was held with a self-retaining clamp.  Exparel Marcaine was injected while cement was setting up and 15 minutes was hard.  Tourniquet deflation.  TXA was given by CRNA hemostasis obtained in the standard layered closure #1 Vicryl in the  quad tendon split between the medial one third lateral two thirds and medial retinaculum.  2-0 Vicryl subtenons tissue skin staple closure and postop dressing with knee immobilizer.

## 2020-06-15 NOTE — Anesthesia Postprocedure Evaluation (Signed)
Anesthesia Post Note  Patient: Tracy Johnston  Procedure(s) Performed: LEFT TOTAL KNEE ARTHROPLASTY (Left Knee)     Anesthesia Type: MAC Level of consciousness: oriented and awake and alert Pain management: pain level controlled Vital Signs Assessment: post-procedure vital signs reviewed and stable Respiratory status: spontaneous breathing and respiratory function stable Cardiovascular status: blood pressure returned to baseline and stable Postop Assessment: no headache, no backache, no apparent nausea or vomiting and patient able to bend at knees Anesthetic complications: no   No complications documented.  Last Vitals:  Vitals:   06/15/20 1200 06/15/20 1221  BP: (!) 144/79 (!) 155/93  Pulse:  (!) 55  Resp: 20 16  Temp: (!) 36.2 C 36.6 C  SpO2: 98% 98%    Last Pain:  Vitals:   06/15/20 1314  TempSrc:   PainSc: 6                  Candra R Lalia Loudon

## 2020-06-16 LAB — CBC
HCT: 40.3 % (ref 36.0–46.0)
Hemoglobin: 14.3 g/dL (ref 12.0–15.0)
MCH: 31.8 pg (ref 26.0–34.0)
MCHC: 35.5 g/dL (ref 30.0–36.0)
MCV: 89.6 fL (ref 80.0–100.0)
Platelets: 193 10*3/uL (ref 150–400)
RBC: 4.5 MIL/uL (ref 3.87–5.11)
RDW: 14.6 % (ref 11.5–15.5)
WBC: 13.8 10*3/uL — ABNORMAL HIGH (ref 4.0–10.5)
nRBC: 0 % (ref 0.0–0.2)

## 2020-06-16 NOTE — Progress Notes (Signed)
Orthopedic Tech Progress Note Patient Details:  Tracy Johnston Jun 25, 1950 [id number] Patient refused due to pain. I did leave machine setup and ready in room. Told patient when she was ready to have RN call so I can apply. I did let patient know Its important to used the machine to help with discomfort.   CPM Left Knee CPM Left Knee: On Left Knee Flexion (Degrees): 0 Left Knee Extension (Degrees): 60 Additional Comments: added fresh ice  Post Interventions Patient Tolerated: Refused intervention Instructions Provided: Care of device,Other (comment)  Janit Pagan 06/16/2020, 8:38 AM

## 2020-06-16 NOTE — Care Management Obs Status (Signed)
[place] NOTIFICATION   Patient Details  Name: Estle Sabella MRN: [id number] Date of Birth: August 07, 1950   Medicare Observation Status Notification Given:  Yes    Bartholomew Crews, RN 06/16/2020, 12:02 PM

## 2020-06-16 NOTE — Progress Notes (Signed)
Physical Therapy Treatment Patient Details Name: Tracy Johnston MRN: [id number] DOB: 1950-02-13 Today's Date: 06/16/2020    History of Present Illness Pt is a 70 y/o female s/p L TKA. PMH includes COPD, cervical cancer, a fib, HTN, tobbacco use, and bilateral THA.    PT Comments    Pt making slow progress towards goals. Session limited secondary to pain. Pt required significantly increased time and effort for all mobilities. Pt attempted EOB, but unable to rest LLE on ground secondary to pain with knee flexion. Attempted several exercises using belt for assist, however pt unable to perform and declining physical assist from PTA, secondary to pain. Placed on CPM at end of session. Will check back in PM to continue mobility progression.    Follow Up Recommendations  Follow surgeon's recommendation for DC plan and follow-up therapies;Supervision/Assistance - 24 hour     Equipment Recommendations  Rolling walker with 5" wheels    Recommendations for Other Services       Precautions / Restrictions Precautions Precautions: Knee Precaution Booklet Issued: No Precaution Comments: Verbally reviewed knee precautions with pt. Restrictions Weight Bearing Restrictions: Yes LLE Weight Bearing: Weight bearing as tolerated    Mobility  Bed Mobility Overal bed mobility: Needs Assistance Bed Mobility: Rolling;Sit to Sidelying;Sit to Supine Rolling: Supervision (with use of bed rails.)     Sit to supine: Min assist;HOB elevated Sit to sidelying: Min assist;HOB elevated General bed mobility comments: Rolling for bed pad replacement with use of bed rails. Significantly increased time for all bed mobilities. Pt refusing assist secondary to pain, but unable to raise LLE onto bed with out help. Max effort on pts part to come to sitting EOB.    Transfers                    Ambulation/Gait                 Stairs             Wheelchair Mobility    Modified Rankin  (Stroke Patients Only)       Balance                                            Cognition Arousal/Alertness: Awake/alert Behavior During Therapy: WFL for tasks assessed/performed Overall Cognitive Status: Within Functional Limits for tasks assessed                                        Exercises Total Joint Exercises Ankle Circles/Pumps: Both;10 reps;Supine Quad Sets: Left;5 reps;Supine (mimimal activation) Heel Slides:  (attempted, unable with assist from belt. Refusing assist) Hip ABduction/ADduction:  (attempted, unable with assist from belt. Refusing assist) Goniometric ROM: Knee ROM very limited, not formally assessed.    General Comments        Pertinent Vitals/Pain Pain Assessment: Faces Faces Pain Scale: Hurts whole lot Pain Location: L knee Pain Descriptors / Indicators: Grimacing;Guarding;Moaning Pain Intervention(s): Monitored during session;Limited activity within patient's tolerance;Repositioned;Premedicated before session    Home Living                      Prior Function            PT Goals (current goals can now be found in the care plan  section) Acute Rehab PT Goals Patient Stated Goal: to decrease pain PT Goal Formulation: With patient Time For Goal Achievement: 06/29/20 Potential to Achieve Goals: Fair Progress towards PT goals: Progressing toward goals    Frequency    7X/week      PT Plan Current plan remains appropriate    Co-evaluation              AM-PAC PT "6 Clicks" Mobility   Outcome Measure  Help needed turning from your back to your side while in a flat bed without using bedrails?: A Little Help needed moving from lying on your back to sitting on the side of a flat bed without using bedrails?: A Lot Help needed moving to and from a bed to a chair (including a wheelchair)?: A Lot Help needed standing up from a chair using your arms (e.g., wheelchair or bedside chair)?: A  Lot Help needed to walk in hospital room?: A Lot Help needed climbing 3-5 steps with a railing? : Total 6 Click Score: 12    End of Session   Activity Tolerance: Patient limited by pain Patient left: in bed;with call bell/phone within reach Nurse Communication: Mobility status PT Visit Diagnosis: Unsteadiness on feet (R26.81);Difficulty in walking, not elsewhere classified (R26.2);Pain Pain - Right/Left: Left Pain - part of body: Knee     Time: 6759-1638 PT Time Calculation (min) (ACUTE ONLY): 38 min  Charges:  $Therapeutic Exercise: 8-22 mins $Therapeutic Activity: 23-37 mins                     Benjiman Core, [place] Pager [phone] Acute Rehab   Allena Katz 06/16/2020, 9:57 AM

## 2020-06-16 NOTE — Progress Notes (Signed)
Occupational Therapy Evaluation Patient Details Name: Tracy Johnston MRN: [id number] DOB: March 23, 1950 Today's Date: 06/16/2020    History of Present Illness Pt is a 70 y/o female s/p L TKA. PMH includes COPD, cervical cancer, a fib, HTN, tobbacco use, and bilateral THA.   Clinical Impression   Prior to admission, pt was living alone in a 1-level house with no STE. Pt received assistance from a [facility] aide M-F 8 hours per day. Recently, pt primarily utilized a wheelchair for functional mobility 2/2 knee pain. Pt reports using a SPC PRN before the knee pain began. Pt performed stand pivot transfers to/from surfaces with S/A. Pt required increased assistance with bathing/dressing/cooking/cleaning/driving from [facility] aide. Pt was able to perform grooming, feeding/eating/ toileting with mod I. Pt also has support from her son and friends.   Today, pt received sitting EOB with PTA present at bedside, pt agreeable to OT eval. Pt presents with increased LLE pain, low activity tolerance, and deconditioning. Currently, pt requires mod assist x2 for sit>stand from EOB using RW, mod assist x2 for stand pivot transfer from bed>chair using RW, max-total assist for LB self-care, and min assist-mod I for UB self-care. OT educated pt on WBAT LLE precautions, edema management (elevating), PLB, activity pacing, AE/DME (brief intro to reacher, sock aide, BSC, RW), RW safety management, and adaptive ADL strategies. Pt agreed with OT and PTA that she is not safe enough to discharge home yet and would benefit from continued skilled acute care therapy and possibly rehab. See rec below. OT will continue to follow pt acutely as able.    Follow Up Recommendations  Other (comment) (Currently recommend CIR if possible (has up to 24/7 S/A available). May progress to home with increased S/A and HHOT. Pt is not safe to discharge home at this point (2/2 deconditioning and increased pain with movement).)    Equipment Recommendations  Other  (comment) (will continue to assess)    Recommendations for Other Services Other (comment) (None)     Precautions / Restrictions Precautions Precautions: Knee Precaution Booklet Issued: No Precaution Comments: Verbally reviewed knee precautions with pt. Knee bent on arrival Restrictions Weight Bearing Restrictions: Yes LLE Weight Bearing: Weight bearing as tolerated      Mobility Bed Mobility Overal bed mobility: Needs Assistance Bed Mobility: Supine to Sit     Supine to sit: Mod assist     General bed mobility comments: Pt using belt to assist LLE. Physical assist given at hips only, as pt declines assist with LLE.    Transfers Overall transfer level: Needs assistance Equipment used: Rolling walker (2 wheeled) Transfers: Sit to/from [facility] Sit to Stand: Mod assist;+2 physical assistance Stand pivot transfers: Mod assist;+2 physical assistance       General transfer comment: Light mod A to rise from EOB x2. Pt with posterior bias requiring multimodal cues to shift weight forward. SPT to recliner chair. Pt unable to fully offload RLE to step, instead progressing by wiggling R foot.    Balance Overall balance assessment: Needs assistance Sitting-balance support: Bilateral upper extremity supported Sitting balance-Leahy Scale: Fair   Postural control: Posterior lean Standing balance support: Bilateral upper extremity supported Standing balance-Leahy Scale: Poor Standing balance comment: external support required      ADL either performed or assessed with clinical judgement   ADL Overall ADL's : Needs assistance/impaired Eating/Feeding: Independent;Sitting Eating/Feeding Details (indicate cue type and reason): for drinking water sitting EOB Grooming: Wash/dry face;Sitting Grooming Details (indicate cue type and reason): sitting EOB Upper  Body Bathing: Set up;Sitting Upper Body Bathing Details (indicate cue type and reason): simulated Lower  Body Bathing: Maximal assistance;Sitting/lateral leans;Sit to/from stand Lower Body Bathing Details (indicate cue type and reason): simulated Upper Body Dressing : Set up;Sitting Upper Body Dressing Details (indicate cue type and reason): for gown management sitting EOB Lower Body Dressing: Maximal assistance;Sitting/lateral leans;Sit to/from stand Lower Body Dressing Details (indicate cue type and reason): for underwear management sitting EOB Toilet Transfer: Moderate assistance;+2 for physical assistance;+2 for safety/equipment;Cueing for safety;Cueing for sequencing;Stand-pivot;RW (bed>chair with RW (simulated toilet transfer)) Toilet Transfer Details (indicate cue type and reason): mod assist x2 for sit>stand (2x) from EOB using RW and mod assist x2 for stand pivot transfer from bed>chair using RW; cues for safe technique and PLB Toileting- Clothing Manipulation and Hygiene: Maximal assistance;Sitting/lateral lean;Sit to/from stand Toileting - Clothing Manipulation Details (indicate cue type and reason): simulated Tub/ Banker:  (not assessed)   Functional mobility during ADLs: Moderate assistance;+2 for physical assistance;+2 for safety/equipment;Cueing for safety;Cueing for sequencing;Rolling walker General ADL Comments: pt improving functional mobility but still c/o increased LLE pain (guarding)     Vision Baseline Vision/History: Wears glasses Wears Glasses: At all times (not available at the moment (glasses at home)) Patient Visual Report: No change from baseline Vision Assessment?: No apparent visual deficits     Perception Perception Perception Tested?: No   Praxis Praxis Praxis tested?: Not tested    Pertinent Vitals/Pain Pain Assessment: 0-10 Pain Score: 8  Pain Location: L knee Pain Descriptors / Indicators: Grimacing;Guarding;Moaning Pain Intervention(s): Monitored during session;Limited activity within patient's tolerance;Repositioned;Patient requesting pain  meds-RN notified;RN gave pain meds during session     Hand Dominance Right   Extremity/Trunk Assessment Upper Extremity Assessment Upper Extremity Assessment: Overall WFL for tasks assessed   Lower Extremity Assessment Lower Extremity Assessment: LLE deficits/detail LLE Deficits / Details: increased pain with movement but improving LLE: Unable to fully assess due to pain LLE Sensation:  (increased tingling/numbness) LLE Coordination: decreased gross motor   Cervical / Trunk Assessment Cervical / Trunk Assessment: Normal   Communication Communication Communication: No difficulties   Cognition Arousal/Alertness: Awake/alert Behavior During Therapy: WFL for tasks assessed/performed Overall Cognitive Status: Within Functional Limits for tasks assessed     General Comments: Anxious and fearful of pain   General Comments  reports mild tingling/numbness to LLE, ace wrap to LLE intact, anticipate edema to LLE underneath ace wrap    Exercises Exercises: Total Joint        Home Living Family/patient expects to be discharged to:: Private residence Living Arrangements: Alone Available Help at Discharge: Personal care attendant;Available PRN/intermittently (home health aide Monday-Friday 10am-6pm) Type of Home: House Home Access: Level entry     Home Layout: One level     Bathroom Shower/Tub: Teacher, early years/pre: Standard Bathroom Accessibility: Yes How Accessible: Accessible via walker Home Equipment: [place] - single point;Wheelchair - [employer];Shower seat   Additional Comments: Aide come M-F for 8 hours a day      Prior Functioning/Environment Level of Independence: Needs assistance  Gait / Transfers Assistance Needed: Has been using WC for mobility. Is independent with transfers using cane ADL's / Homemaking Assistance Needed: Aide assists with bathing/dressing/cooking/cleaning/meds/laundry/driving Communication / Swallowing Assistance  Needed: no issues Comments: sleeps in regular flat bed        OT Problem List: Decreased strength;Decreased range of motion;Decreased activity tolerance;Impaired balance (sitting and/or standing);Decreased knowledge of precautions;Pain;Increased edema      OT Treatment/Interventions: Self-care/ADL training;Therapeutic exercise;Neuromuscular  education;DME and/or AE instruction;Therapeutic activities;Patient/family education    OT Goals(Current goals can be found in the care plan section) Acute Rehab OT Goals Patient Stated Goal: to decrease pain OT Goal Formulation: With patient Time For Goal Achievement: 06/30/20 Potential to Achieve Goals: Good  OT Frequency: Min 2X/week       Co-evaluation PT/OT/SLP Co-Evaluation/Treatment: Yes Reason for Co-Treatment: For patient/therapist safety;To address functional/ADL transfers PT goals addressed during session: Mobility/safety with mobility OT goals addressed during session: ADL's and self-care      AM-PAC OT "6 Clicks" Daily Activity     Outcome Measure Help from another person eating meals?: None Help from another person taking care of personal grooming?: A Little Help from another person toileting, which includes using toliet, bedpan, or urinal?: A Lot Help from another person bathing (including washing, rinsing, drying)?: A Lot Help from another person to put on and taking off regular upper body clothing?: A Little Help from another person to put on and taking off regular lower body clothing?: A Lot 6 Click Score: 16   End of Session Equipment Utilized During Treatment: Gait belt;Rolling walker Nurse Communication: Mobility status  Activity Tolerance: Patient tolerated treatment well;Patient limited by pain Patient left: in chair;with call bell/phone within reach;Other (comment) (nurse aware)  OT Visit Diagnosis: Unsteadiness on feet (R26.81);Muscle weakness (generalized) (M62.81);Pain Pain - Right/Left: Left Pain - part of  body: Leg                Time: 0237-0327 OT Time Calculation (min): 50 min Charges:  OT General Charges $OT Visit: 1 Visit OT Evaluation $OT Eval Moderate Complexity: 1 Mod OT Treatments $Self Care/Home Management : 8-22 mins  Michel Bickers, OTR/L [facility] [place] 06/16/2020, 5:31 PM

## 2020-06-16 NOTE — TOC Initial Note (Signed)
Transition of Care [facility]) - Initial/Assessment Note    Patient Details  Name: Tracy Johnston MRN: [id number] Date of Birth: [birth year]  Transition of [facility]) CM/SW Contact:    Bartholomew Crews, RN Phone Number: [phone] 06/16/2020, 12:11 PM  Clinical Narrative:                  Spoke with patient at the bedside. Discussed transition planning. Patient stated that she will discharge home on Monday. She stated that she has a home health aide for 8 hours a day M - F, and that her aide will pick her up Monday.   Discussed need for [facility] PT. Offered choice of home health agency. Patient stated that she wanted whoever Dr. Lorin Mercy recommended.   TOC following for transition needs.   Expected Discharge Plan: [place] Barriers to Discharge: Continued Medical Work up   Patient Goals and CMS Choice Patient states their goals for this hospitalization and ongoing recovery are:: return home CMS Medicare.gov Compare Post Acute Care list provided to:: Patient Choice offered to / list presented to : Patient  Expected Discharge Plan and Services Expected Discharge Plan: [place]   Discharge Planning Services: CM Consult Post Acute Care Choice: [place] arrangements for the past 2 months: Single Family Home                                      Prior Living Arrangements/Services Living arrangements for the past 2 months: Single Family Home Lives with:: Self Patient language and need for interpreter reviewed:: Yes        Need for Family Participation in Patient Care: Yes (Comment) Care giver support system in place?: Yes (comment) Current home services: DME,Homehealth aide Criminal Activity/Legal Involvement Pertinent to Current Situation/Hospitalization: No - Comment as needed  Activities of Daily Living      Permission Sought/Granted                  Emotional Assessment Appearance:: Appears stated  age Attitude/Demeanor/Rapport: Engaged Affect (typically observed): Accepting Orientation: : Oriented to Self,Oriented to Place,Oriented to  Time,Oriented to Situation Alcohol / Substance Use: Not Applicable Psych Involvement: No (comment)  Admission diagnosis:  Total knee replacement status [Z96.659] Patient Active Problem List   Diagnosis Date Noted  . 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 Unilateral primary osteoarthritis, left knee 04/26/2020  . BMI 33.0-33.9,adult 08/12/2018  . HLD (hyperlipidemia) 08/12/2018  . LLQ abdominal pain 08/12/2018  . Mixed stress and urge urinary incontinence 08/12/2018  . Bilateral primary osteoarthritis of knee 08/12/2018  . History of bilateral hip arthroplasty 07/09/2017  . Long-term current use of opiate analgesic 02/18/2017  . On long term drug therapy 02/06/2017  . Chronic low back pain 02/06/2017  . Chronic pain syndrome 02/06/2017  . Pain in left knee 11/22/2015  . Incisional hernia, without obstruction or gangrene 09/09/2012  . Acute blood loss anemia 10/20/2011    Class: Acute  . COPD (chronic obstructive pulmonary disease) ([place]) 03/03/2011  . Dyspnea 12/02/2010  . Preop cardiovascular exam 10/31/2010  . Claudication ([place]) 10/18/2010  . Atrial fibrillation ([place])   . Tobacco abuse   . Hypertension    PCP:  Leeanne Rio, MD Pharmacy:   [id number] - [place], [place] [address] [place] [address]  Phone: [phone] Fax: [phone]     Social Determinants of Health (SDOH) Interventions    Readmission Risk Interventions No flowsheet data found.

## 2020-06-16 NOTE — Progress Notes (Signed)
   Subjective: 1 Day Post-Op Procedure(s) (LRB): LEFT TOTAL KNEE ARTHROPLASTY (Left) Patient reports pain as moderate.    Objective: Vital signs in last 24 hours: Temp:  [97.2 F (36.2 C)-98.4 F (36.9 C)] 97.7 F (36.5 C) (05/21 0423) Pulse Rate:  [51-88] 84 (05/21 0423) Resp:  [14-25] 16 (05/21 0423) BP: (105-155)/(57-95) 139/95 (05/21 0423) SpO2:  [93 %-100 %] 100 % (05/21 0423)  Intake/Output from previous day: 05/20 0701 - 05/21 0700 In: 750 [I.V.:750] Out: 1250 [Urine:1200; Blood:50] Intake/Output this shift: No intake/output data recorded.  Recent Labs    06/13/20 1056 06/16/20 0430  HGB 16.0* 14.3   Recent Labs    06/13/20 1056 06/16/20 0430  WBC 12.4* 13.8*  RBC 5.11 4.50  HCT 45.6 40.3  PLT 226 193   Recent Labs    06/13/20 1056  NA 141  K 3.9  CL 109  CO2 26  BUN 19  CREATININE 1.04*  GLUCOSE 91  CALCIUM 9.4   No results for input(s): LABPT, INR in the last 72 hours.  Neurologically intact No results found.  Assessment/Plan: 1 Day Post-Op Procedure(s) (LRB): LEFT TOTAL KNEE ARTHROPLASTY (Left) Up with therapy, expect slow progress.   Tracy Johnston 06/16/2020, 7:23 AM

## 2020-06-16 NOTE — Progress Notes (Signed)
RT in to give scheduled inhalers. Pt states she doesn't take those and got upset at RT. RT explained that the hospital does not carry the Trelogy inhaler that she uses at home and that these are the interchanges for that. Pt continued to get upset stating she was told to bring her medicine in. Pt educated that the purpose of that is so staff no her meds/dosages. Pt then took home Trelogy and albuterol inhaler. RT to notify MD and RN aware also. RT will continue to monitor.

## 2020-06-16 NOTE — Progress Notes (Signed)
Physical Therapy Treatment Patient Details Name: Tracy Johnston MRN: [id number] DOB: 04/16/1950 Today's Date: 06/16/2020    History of Present Illness Pt is a 70 y/o female s/p L TKA. PMH includes COPD, cervical cancer, a fib, HTN, tobbacco use, and bilateral THA.    PT Comments    On arrival to room pt reporting she would like to get out of bed, however pain is an 8/10. RN notified and administered pain meds. Pt able to progress EOB with increased time and effort. At which point OT arrived and participated in session as pt needed +2 for safe transfer to recliner chair. Pt stood to don undergarments and again for SPT to recliner chair. Pt does have aide at home, however she would likely benefit from short term SNF stay prior to d/c home. Will continue to follow acutely for mobility progression.     Follow Up Recommendations  Follow surgeon's recommendation for DC plan and follow-up therapies;Supervision/Assistance - 24 hour     Equipment Recommendations  Rolling walker with 5" wheels    Recommendations for Other Services       Precautions / Restrictions Precautions Precautions: Knee Precaution Booklet Issued: No Precaution Comments: Verbally reviewed knee precautions with pt. Knee bent on arrival Restrictions Weight Bearing Restrictions: Yes LLE Weight Bearing: Weight bearing as tolerated    Mobility  Bed Mobility Overal bed mobility: Needs Assistance Bed Mobility: Supine to Sit     Supine to sit: Mod assist     General bed mobility comments: Pt using belt to assist LLE. Physical assist given at hips only, as pt declines assist with LLE.    Transfers Overall transfer level: Needs assistance Equipment used: Rolling walker (2 wheeled) Transfers: Sit to/from [facility] Sit to Stand: Mod assist;+2 physical assistance Stand pivot transfers: Mod assist;+2 physical assistance       General transfer comment: Light mod A to rise from EOB x2. Pt with  posterior bias requiring multimodal cues to shift weight forward. SPT to recliner chair. Pt unable to fully offload RLE to step, instead progressing by wiggling R foot.  Ambulation/Gait             General Gait Details: unable at this time   Stairs             Wheelchair Mobility    Modified Rankin (Stroke Patients Only)       Balance Overall balance assessment: Needs assistance Sitting-balance support: Bilateral upper extremity supported Sitting balance-Leahy Scale: Fair   Postural control: Posterior lean Standing balance support: Bilateral upper extremity supported Standing balance-Leahy Scale: Poor Standing balance comment: external support required                            Cognition Arousal/Alertness: Awake/alert Behavior During Therapy: WFL for tasks assessed/performed Overall Cognitive Status: Within Functional Limits for tasks assessed                                 General Comments: Anxious and fearful of pain      Exercises      General Comments        Pertinent Vitals/Pain Pain Assessment: 0-10 Pain Score: 8  Pain Location: L knee Pain Descriptors / Indicators: Grimacing;Guarding;Moaning Pain Intervention(s): Monitored during session;Limited activity within patient's tolerance;Repositioned;Patient requesting pain meds-RN notified;RN gave pain meds during session    Home Living  Prior Function            PT Goals (current goals can now be found in the care plan section) Acute Rehab PT Goals Patient Stated Goal: to decrease pain PT Goal Formulation: With patient Time For Goal Achievement: 06/29/20 Potential to Achieve Goals: Fair Progress towards PT goals: Progressing toward goals    Frequency    7X/week      PT Plan Current plan remains appropriate    Co-evaluation PT/OT/SLP Co-Evaluation/Treatment: Yes Reason for Co-Treatment: For patient/therapist safety;To  address functional/ADL transfers PT goals addressed during session: Mobility/safety with mobility OT goals addressed during session: ADL's and self-care      AM-PAC PT "6 Clicks" Mobility   Outcome Measure  Help needed turning from your back to your side while in a flat bed without using bedrails?: A Little Help needed moving from lying on your back to sitting on the side of a flat bed without using bedrails?: A Lot Help needed moving to and from a bed to a chair (including a wheelchair)?: A Lot Help needed standing up from a chair using your arms (e.g., wheelchair or bedside chair)?: A Lot Help needed to walk in hospital room?: A Lot Help needed climbing 3-5 steps with a railing? : Total 6 Click Score: 12    End of Session Equipment Utilized During Treatment: Gait belt Activity Tolerance: Patient limited by pain Patient left: with call bell/phone within reach;in chair Nurse Communication: Mobility status;Need for lift equipment PT Visit Diagnosis: Unsteadiness on feet (R26.81);Difficulty in walking, not elsewhere classified (R26.2);Pain Pain - Right/Left: Left Pain - part of body: Knee     Time: 8416-6063 PT Time Calculation (min) (ACUTE ONLY): 25 min  Charges:  $Therapeutic Activity: 8-22 mins                    Benjiman Core, [place] Pager [phone] Acute Rehab   Allena Katz 06/16/2020, 3:23 PM

## 2020-06-17 DIAGNOSIS — Z79899 Other long term (current) drug therapy: Secondary | ICD-10-CM | POA: Diagnosis not present

## 2020-06-17 DIAGNOSIS — Z87891 Personal history of nicotine dependence: Secondary | ICD-10-CM | POA: Diagnosis not present

## 2020-06-17 DIAGNOSIS — G894 Chronic pain syndrome: Secondary | ICD-10-CM | POA: Diagnosis present

## 2020-06-17 DIAGNOSIS — M79661 Pain in right lower leg: Secondary | ICD-10-CM | POA: Diagnosis not present

## 2020-06-17 DIAGNOSIS — I10 Essential (primary) hypertension: Secondary | ICD-10-CM | POA: Diagnosis present

## 2020-06-17 DIAGNOSIS — M109 Gout, unspecified: Secondary | ICD-10-CM | POA: Diagnosis present

## 2020-06-17 DIAGNOSIS — Z20822 Contact with and (suspected) exposure to covid-19: Secondary | ICD-10-CM | POA: Diagnosis present

## 2020-06-17 DIAGNOSIS — Z96651 Presence of right artificial knee joint: Secondary | ICD-10-CM | POA: Diagnosis not present

## 2020-06-17 DIAGNOSIS — I482 Chronic atrial fibrillation, unspecified: Secondary | ICD-10-CM | POA: Diagnosis present

## 2020-06-17 DIAGNOSIS — Z905 Acquired absence of kidney: Secondary | ICD-10-CM | POA: Diagnosis not present

## 2020-06-17 DIAGNOSIS — M1712 Unilateral primary osteoarthritis, left knee: Secondary | ICD-10-CM | POA: Diagnosis present

## 2020-06-17 DIAGNOSIS — L538 Other specified erythematous conditions: Secondary | ICD-10-CM | POA: Diagnosis not present

## 2020-06-17 DIAGNOSIS — D571 Sickle-cell disease without crisis: Secondary | ICD-10-CM | POA: Diagnosis present

## 2020-06-17 DIAGNOSIS — Z90710 Acquired absence of both cervix and uterus: Secondary | ICD-10-CM | POA: Diagnosis not present

## 2020-06-17 DIAGNOSIS — E785 Hyperlipidemia, unspecified: Secondary | ICD-10-CM | POA: Diagnosis present

## 2020-06-17 DIAGNOSIS — J189 Pneumonia, unspecified organism: Secondary | ICD-10-CM | POA: Diagnosis not present

## 2020-06-17 DIAGNOSIS — Z96652 Presence of left artificial knee joint: Secondary | ICD-10-CM | POA: Diagnosis not present

## 2020-06-17 DIAGNOSIS — Z7951 Long term (current) use of inhaled steroids: Secondary | ICD-10-CM | POA: Diagnosis not present

## 2020-06-17 DIAGNOSIS — Z85528 Personal history of other malignant neoplasm of kidney: Secondary | ICD-10-CM | POA: Diagnosis not present

## 2020-06-17 DIAGNOSIS — K5900 Constipation, unspecified: Secondary | ICD-10-CM | POA: Diagnosis not present

## 2020-06-17 DIAGNOSIS — K439 Ventral hernia without obstruction or gangrene: Secondary | ICD-10-CM | POA: Diagnosis present

## 2020-06-17 DIAGNOSIS — R062 Wheezing: Secondary | ICD-10-CM | POA: Diagnosis not present

## 2020-06-17 DIAGNOSIS — R1011 Right upper quadrant pain: Secondary | ICD-10-CM | POA: Diagnosis not present

## 2020-06-17 DIAGNOSIS — J449 Chronic obstructive pulmonary disease, unspecified: Secondary | ICD-10-CM | POA: Diagnosis present

## 2020-06-17 DIAGNOSIS — D72829 Elevated white blood cell count, unspecified: Secondary | ICD-10-CM | POA: Diagnosis not present

## 2020-06-17 DIAGNOSIS — Z6835 Body mass index (BMI) 35.0-35.9, adult: Secondary | ICD-10-CM | POA: Diagnosis not present

## 2020-06-17 DIAGNOSIS — Z96643 Presence of artificial hip joint, bilateral: Secondary | ICD-10-CM | POA: Diagnosis present

## 2020-06-17 DIAGNOSIS — Z7901 Long term (current) use of anticoagulants: Secondary | ICD-10-CM | POA: Diagnosis not present

## 2020-06-17 DIAGNOSIS — D72828 Other elevated white blood cell count: Secondary | ICD-10-CM | POA: Diagnosis not present

## 2020-06-17 LAB — CBC
HCT: 40.3 % (ref 36.0–46.0)
Hemoglobin: 14 g/dL (ref 12.0–15.0)
MCH: 31 pg (ref 26.0–34.0)
MCHC: 34.7 g/dL (ref 30.0–36.0)
MCV: 89.4 fL (ref 80.0–100.0)
Platelets: 176 10*3/uL (ref 150–400)
RBC: 4.51 MIL/uL (ref 3.87–5.11)
RDW: 14.6 % (ref 11.5–15.5)
WBC: 16.3 10*3/uL — ABNORMAL HIGH (ref 4.0–10.5)
nRBC: 0 % (ref 0.0–0.2)

## 2020-06-17 MED ORDER — IPRATROPIUM-ALBUTEROL 0.5-2.5 (3) MG/3ML IN SOLN
3.0000 mL | Freq: Three times a day (TID) | RESPIRATORY_TRACT | Status: DC
  Administered 2020-06-18: 3 mL via RESPIRATORY_TRACT
  Filled 2020-06-17: qty 3

## 2020-06-17 NOTE — Progress Notes (Signed)
Physical Therapy Treatment Patient Details Name: Tracy Johnston MRN: [id number] DOB: Feb 09, 1950 Today's Date: 06/17/2020    History of Present Illness Pt is a 70 y/o female s/p L TKA. PMH includes COPD, cervical cancer, a fib, HTN, tobbacco use, and bilateral THA.    PT Comments    Pt required +2 mod assist to power up to stand from recliner and mod assist +2 safety/chair follow ambulation 4' with RW. Significantly increased time required to complete all mobility tasks. Pt is very anxious/fearful of pain. Poor quad activation noted LLE. Knee immobilizer utilized for mobility and pt again instructed in importance of quad sets. Pt very apprehensive to any touch, attempts at [facility] LLE. Pt declining return to bed at this time. Repositioned in recliner with feet elevated.   Follow Up Recommendations  Follow surgeon's recommendation for DC plan and follow-up therapies;Supervision/Assistance - 24 hour     Equipment Recommendations  Rolling walker with 5" wheels    Recommendations for Other Services       Precautions / Restrictions Precautions Precautions: Fall;Knee Restrictions LLE Weight Bearing: Weight bearing as tolerated    Mobility  Bed Mobility Overal bed mobility: Needs Assistance Bed Mobility: Supine to Sit     Supine to sit: Mod assist;HOB elevated     General bed mobility comments: Pt received in recliner and returned to recliner.    Transfers Overall transfer level: Needs assistance Equipment used: Rolling walker (2 wheeled) Transfers: Sit to/from Stand Sit to Stand: +2 physical assistance;Mod assist Stand pivot transfers: Mod assist       General transfer comment: cues for hand placement, increased time, assist to power up and stabilize balance, +2 assist to power up from recliner.  Ambulation/Gait Ambulation/Gait assistance: +2 safety/equipment;Mod assist Gait Distance (Feet): 4 Feet Assistive device: Rolling walker (2 wheeled) Gait Pattern/deviations:  Step-to pattern;Decreased stride length;Antalgic;Decreased stance time - left;Decreased weight shift to left Gait velocity: very, very slow Gait velocity interpretation: <1.8 ft/sec, indicate of risk for recurrent falls General Gait Details: cues for sequencing and posture; assist to maintain balance, progress LLE through swing phase, and to weight shift L to step with RLE. Significant time required to mobilize 4' with continuous verbal cues/encouragement. Distance limited by pain and fatigue. +2 for close chair follow.   Stairs             Wheelchair Mobility    Modified Rankin (Stroke Patients Only)       Balance Overall balance assessment: Needs assistance Sitting-balance support: Bilateral upper extremity supported;Feet supported Sitting balance-Leahy Scale: Fair     Standing balance support: Bilateral upper extremity supported;During functional activity Standing balance-Leahy Scale: Poor Standing balance comment: external support required                            Cognition Arousal/Alertness: Awake/alert Behavior During Therapy: Anxious;WFL for tasks assessed/performed Overall Cognitive Status: Within Functional Limits for tasks assessed                                 General Comments: Anxious and fearful of pain      Exercises Total Joint Exercises Ankle Circles/Pumps: Both;10 reps;Supine Quad Sets: Left;5 reps;Supine (minimal activation, pt unwilling to receive tactile feedback at quads/patella)    General Comments General comments (skin integrity, edema, etc.): L knee immobilizer utilized for mobility due to poor quad strength.      Pertinent Vitals/Pain  Pain Assessment: Faces Faces Pain Scale: Hurts whole lot Pain Location: L knee Pain Descriptors / Indicators: Grimacing;Guarding;Moaning Pain Intervention(s): Limited activity within patient's tolerance;Monitored during session;Repositioned    Home Living                       Prior Function            PT Goals (current goals can now be found in the care plan section) Acute Rehab PT Goals Patient Stated Goal: home Progress towards PT goals: Progressing toward goals (slowly)    Frequency    7X/week      PT Plan Current plan remains appropriate    Co-evaluation              AM-PAC PT "6 Clicks" Mobility   Outcome Measure  Help needed turning from your back to your side while in a flat bed without using bedrails?: A Little Help needed moving from lying on your back to sitting on the side of a flat bed without using bedrails?: A Lot Help needed moving to and from a bed to a chair (including a wheelchair)?: A Lot Help needed standing up from a chair using your arms (e.g., wheelchair or bedside chair)?: A Lot Help needed to walk in hospital room?: A Lot Help needed climbing 3-5 steps with a railing? : Total 6 Click Score: 12    End of Session Equipment Utilized During Treatment: Gait belt;Left knee immobilizer Activity Tolerance: Patient limited by pain;Patient limited by fatigue Patient left: with call bell/phone within reach;in chair Nurse Communication: Mobility status PT Visit Diagnosis: Unsteadiness on feet (R26.81);Difficulty in walking, not elsewhere classified (R26.2);Pain;Muscle weakness (generalized) (M62.81) Pain - Right/Left: Left Pain - part of body: Knee     Time: 6720-9470 PT Time Calculation (min) (ACUTE ONLY): 15 min  Charges:  $Gait Training: 8-22 mins $Therapeutic Activity: 8-22 mins                     Lorrin Goodell, PT  Office # [phone] Pager [phone]    Lorriane Shire 06/17/2020, 11:43 AM

## 2020-06-17 NOTE — Progress Notes (Signed)
Physical Therapy Treatment Patient Details Name: Tracy Johnston MRN: [id number] DOB: November 26, 1950 Today's Date: 06/17/2020    History of Present Illness Pt is a 70 y/o female s/p L TKA. PMH includes COPD, cervical cancer, a fib, HTN, tobbacco use, and bilateral THA.    PT Comments    Pt received in bed, stating "I've got to get out of this bed." She required mod assist bed mobility, mod assist sit to stand with RW from elevated surface, and mod assist SPT with RW. Pt required increased time to complete all mobility skills. Continuous verbal cues needed for sequencing and encouragement. L knee immobilizer in place for mobility due to poor quad strength. Minimal activation noted with quad sets LLE. Upon positioning in recliner, pt with c/o nausea and declining further exercises. Pt in recliner with feet elevated at end of session. Participation significantly limited by pain.  Pt would benefit from ST SNF for further therapy prior to d/c home.   Follow Up Recommendations  Follow surgeon's recommendation for DC plan and follow-up therapies;Supervision/Assistance - 24 hour     Equipment Recommendations  Rolling walker with 5" wheels    Recommendations for Other Services       Precautions / Restrictions Precautions Precautions: Knee Restrictions LLE Weight Bearing: Weight bearing as tolerated    Mobility  Bed Mobility Overal bed mobility: Needs Assistance Bed Mobility: Supine to Sit     Supine to sit: Mod assist;HOB elevated     General bed mobility comments: +rail, increased time (moving very slow, anxious, poor pain tolerance), cues for sequencing, assist with LLE and to scoot hips to EOB using bed pad    Transfers Overall transfer level: Needs assistance Equipment used: Rolling walker (2 wheeled) Transfers: Sit to/from [facility] Sit to Stand: Mod assist;From elevated surface Stand pivot transfers: Mod assist       General transfer comment: increased time  to power up from elevated surface, increased time to stabilize balance, continuous cues throughout transfer for sequencing and posture. Assist with progressing LLE and RW. Pivot steps with RW bed to recliner.  Ambulation/Gait             General Gait Details: unable at this time   Stairs             Wheelchair Mobility    Modified Rankin (Stroke Patients Only)       Balance Overall balance assessment: Needs assistance Sitting-balance support: Bilateral upper extremity supported;Feet supported Sitting balance-Leahy Scale: Fair     Standing balance support: Bilateral upper extremity supported;During functional activity Standing balance-Leahy Scale: Poor Standing balance comment: external support required                            Cognition Arousal/Alertness: Awake/alert Behavior During Therapy: Anxious;WFL for tasks assessed/performed Overall Cognitive Status: Within Functional Limits for tasks assessed                                 General Comments: Anxious and fearful of pain      Exercises Total Joint Exercises Ankle Circles/Pumps: Both;10 reps;Supine Quad Sets: Left;5 reps;Supine (minimal activation, pt unwilling to receive tactile feedback at quads/patella)    General Comments General comments (skin integrity, edema, etc.): L knee immobilizer utilized for transfer due to poor quad strength      Pertinent Vitals/Pain Pain Assessment: Faces Faces Pain Scale: Hurts whole lot  Pain Location: L knee Pain Descriptors / Indicators: Grimacing;Guarding;Moaning Pain Intervention(s): Limited activity within patient's tolerance;Monitored during session;Repositioned;Ice applied    Home Living                      Prior Function            PT Goals (current goals can now be found in the care plan section) Acute Rehab PT Goals Patient Stated Goal: home Progress towards PT goals: Progressing toward goals     Frequency    7X/week      PT Plan Current plan remains appropriate    Co-evaluation              AM-PAC PT "6 Clicks" Mobility   Outcome Measure  Help needed turning from your back to your side while in a flat bed without using bedrails?: A Little Help needed moving from lying on your back to sitting on the side of a flat bed without using bedrails?: A Lot Help needed moving to and from a bed to a chair (including a wheelchair)?: A Lot Help needed standing up from a chair using your arms (e.g., wheelchair or bedside chair)?: A Lot Help needed to walk in hospital room?: A Lot Help needed climbing 3-5 steps with a railing? : Total 6 Click Score: 12    End of Session Equipment Utilized During Treatment: Gait belt Activity Tolerance: Patient limited by pain Patient left: with call bell/phone within reach;in chair Nurse Communication: Mobility status PT Visit Diagnosis: Unsteadiness on feet (R26.81);Difficulty in walking, not elsewhere classified (R26.2);Pain;Muscle weakness (generalized) (M62.81) Pain - Right/Left: Left Pain - part of body: Knee     Time: 0803-0827 PT Time Calculation (min) (ACUTE ONLY): 24 min  Charges:  $Gait Training: 8-22 mins $Therapeutic Activity: 8-22 mins                     Lorrin Goodell, PT  Office # [phone] Pager [phone]    Tracy Johnston 06/17/2020, 8:38 AM

## 2020-06-17 NOTE — TOC Progression Note (Addendum)
Transition of Care [facility]) - Progression Note    Patient Details  Name: Tracy Johnston MRN: [id number] Date of Birth: [birth year]  [facility]) CM/SW Contact  Bartholomew Crews, RN Phone Number: [phone] 06/17/2020, 9:04 AM  Clinical Narrative:     Referral accepted by [facility]. Referral to [employer] to deliver RW to room. TOC following.   Expected Discharge Plan: [place] Barriers to Discharge: Continued Medical Work up  Expected Discharge Plan and Services Expected Discharge Plan: [place]   Discharge Planning Services: CM Consult Post Acute Care Choice: [place] arrangements for the past 2 months: Single Family Home                                       Social Determinants of Health (SDOH) Interventions    Readmission Risk Interventions No flowsheet data found.

## 2020-06-17 NOTE — Progress Notes (Signed)
   Subjective: 2 Days Post-Op Procedure(s) (LRB): LEFT TOTAL KNEE ARTHROPLASTY (Left) Patient reports pain as moderate and severe.  Poor pain tolerance, chronic pain management.   Objective: Vital signs in last 24 hours: Temp:  [97.9 F (36.6 C)-98.3 F (36.8 C)] 98 F (36.7 C) (05/22 0500) Pulse Rate:  [68-82] 68 (05/22 0500) Resp:  [17-19] 17 (05/22 0500) BP: (160-166)/(84-89) 160/89 (05/22 0500) SpO2:  [94 %-100 %] 97 % (05/22 0500)  Intake/Output from previous day: 05/21 0701 - 05/22 0700 In: 1325 [P.O.:320; I.V.:1005] Out: 2600 [Urine:2600] Intake/Output this shift: No intake/output data recorded.  Recent Labs    06/16/20 0430 06/17/20 0233  HGB 14.3 14.0   Recent Labs    06/16/20 0430 06/17/20 0233  WBC 13.8* 16.3*  RBC 4.50 4.51  HCT 40.3 40.3  PLT 193 176   No results for input(s): NA, K, CL, CO2, BUN, CREATININE, GLUCOSE, CALCIUM in the last 72 hours. No results for input(s): LABPT, INR in the last 72 hours.  Neurologically intact No results found.  Assessment/Plan: 2 Days Post-Op Procedure(s) (LRB): LEFT TOTAL KNEE ARTHROPLASTY (Left) Up with therapy, SNF. Can barely contract quad. Stops when she feels pain. Went over isometric quad work that she can do multiple times a day.   Tracy Johnston 06/17/2020, 7:54 AM

## 2020-06-18 LAB — CBC
HCT: 38.5 % (ref 36.0–46.0)
Hemoglobin: 13.7 g/dL (ref 12.0–15.0)
MCH: 31.5 pg (ref 26.0–34.0)
MCHC: 35.6 g/dL (ref 30.0–36.0)
MCV: 88.5 fL (ref 80.0–100.0)
Platelets: 169 10*3/uL (ref 150–400)
RBC: 4.35 MIL/uL (ref 3.87–5.11)
RDW: 14.3 % (ref 11.5–15.5)
WBC: 16 10*3/uL — ABNORMAL HIGH (ref 4.0–10.5)
nRBC: 0 % (ref 0.0–0.2)

## 2020-06-18 MED ORDER — IPRATROPIUM-ALBUTEROL 0.5-2.5 (3) MG/3ML IN SOLN
3.0000 mL | Freq: Four times a day (QID) | RESPIRATORY_TRACT | Status: DC | PRN
  Administered 2020-06-21 – 2020-06-25 (×6): 3 mL via RESPIRATORY_TRACT
  Filled 2020-06-18 (×6): qty 3

## 2020-06-18 NOTE — Plan of Care (Signed)
  Problem: Education: Goal: Knowledge of General Education information will improve Description Including pain rating scale, medication(s)/side effects and non-pharmacologic comfort measures Outcome: Progressing   Problem: Health Behavior/Discharge Planning: Goal: Ability to manage health-related needs will improve Outcome: Progressing   

## 2020-06-18 NOTE — Progress Notes (Signed)
Physical Therapy Treatment Patient Details Name: Tracy Johnston MRN: [id number] DOB: 15-Sep-1950 Today's Date: 06/18/2020    History of Present Illness Pt is a 70 y/o female s/p L TKA. PMH includes COPD, cervical cancer, a fib, HTN, tobbacco use, and bilateral THA.    PT Comments    Pt was seen for exercises on LLE as she is up in chair and does not feel up to standing right now.  Had reviewe of ROM with assist to both legs but is not tolerating her L knee to be flexed at all.  Pt is fairly stiff on L knee in extension and is likely the length of time she is in brace when OOB.  Follow along to encourage her to move LLE and work with nursing on use of analgesic meds.   Follow Up Recommendations  Follow surgeon's recommendation for DC plan and follow-up therapies;Supervision/Assistance - 24 hour     Equipment Recommendations  Rolling walker with 5" wheels    Recommendations for Other Services       Precautions / Restrictions Precautions Precautions: Fall;Knee Precaution Booklet Issued: No Precaution Comments: Verbally reviewed knee precautions with pt. Restrictions Weight Bearing Restrictions: No LLE Weight Bearing: Weight bearing as tolerated    Mobility  Bed Mobility Overal bed mobility: Needs Assistance             General bed mobility comments: up in chair when PT arrived    Transfers Overall transfer level: Needs assistance Equipment used: Rolling walker (2 wheeled) Transfers: Sit to/from Stand;Lateral/Scoot Transfers Sit to Stand: Max assist;From elevated surface        Lateral/Scoot Transfers: Mod assist General transfer comment: continues to need two person help to stadn  Ambulation/Gait             General Gait Details: unable to step   Stairs             Wheelchair Mobility    Modified Rankin (Stroke Patients Only)       Balance Overall balance assessment: Needs assistance Sitting-balance support: Feet supported;Bilateral upper  extremity supported Sitting balance-Leahy Scale: Fair     Standing balance support: Bilateral upper extremity supported;During functional activity Standing balance-Leahy Scale: Poor                              Cognition Arousal/Alertness: Awake/alert Behavior During Therapy: Anxious;WFL for tasks assessed/performed Overall Cognitive Status: Within Functional Limits for tasks assessed                                 General Comments: pt is limited for movemetn on L knee with strong pain reaction to even move brace and try to work on ROM      Exercises Total Joint Exercises Ankle Circles/Pumps: AROM;AAROM;5 reps Quad Sets: AROM;10 reps;Both Heel Slides: AAROM;5 reps;Left Hip ABduction/ADduction: AAROM;10 reps;Both    General Comments General comments (skin integrity, edema, etc.): pt has seeping drainage in immobilizer today, bandage is off in the brace      Pertinent Vitals/Pain Pain Assessment: 0-10 Pain Score: 9  Pain Location: L knee Pain Descriptors / Indicators: Grimacing;Guarding Pain Intervention(s): Limited activity within patient's tolerance;Monitored during session;Premedicated before session;Repositioned    Home Living                      Prior Function  PT Goals (current goals can now be found in the care plan section) Acute Rehab PT Goals Patient Stated Goal: home Progress towards PT goals: Progressing toward goals    Frequency    7X/week      PT Plan Current plan remains appropriate    Co-evaluation              AM-PAC PT "6 Clicks" Mobility   Outcome Measure  Help needed turning from your back to your side while in a flat bed without using bedrails?: A Little Help needed moving from lying on your back to sitting on the side of a flat bed without using bedrails?: A Lot Help needed moving to and from a bed to a chair (including a wheelchair)?: A Lot Help needed standing up from a chair  using your arms (e.g., wheelchair or bedside chair)?: Total Help needed to walk in hospital room?: Total Help needed climbing 3-5 steps with a railing? : Total 6 Click Score: 10    End of Session Equipment Utilized During Treatment: Left knee immobilizer Activity Tolerance: Patient limited by fatigue;Patient limited by pain Patient left: in chair;with call bell/phone within reach;with chair alarm set Nurse Communication: Mobility status PT Visit Diagnosis: Unsteadiness on feet (R26.81);Difficulty in walking, not elsewhere classified (R26.2);Pain;Muscle weakness (generalized) (M62.81) Pain - Right/Left: Left Pain - part of body: Knee     Time: 8295-6213 PT Time Calculation (min) (ACUTE ONLY): 12 min  Charges:  $Therapeutic Exercise: 8-22 mins $Therapeutic Activity: 23-37 mins               Ramond Dial 06/18/2020, 4:21 PM Mee Hives, PT MS Acute Rehab Dept. Number: [place] and [place]

## 2020-06-18 NOTE — Progress Notes (Signed)
   Subjective: 3 Days Post-Op Procedure(s) (LRB): LEFT TOTAL KNEE ARTHROPLASTY (Left) Patient reports pain as moderate.    Objective: Vital signs in last 24 hours: Temp:  [97.9 F (36.6 C)-98.6 F (37 C)] 98.2 F (36.8 C) (05/23 0745) Pulse Rate:  [70-108] 79 (05/23 0745) Resp:  [16-20] 17 (05/23 0745) BP: (113-157)/(76-80) 123/76 (05/23 0745) SpO2:  [94 %-98 %] 96 % (05/23 0854)  Intake/Output from previous day: 05/22 0701 - 05/23 0700 In: 720 [P.O.:720] Out: 400 [Urine:400] Intake/Output this shift: Total I/O In: -  Out: 900 [Urine:900]  Recent Labs    06/16/20 0430 06/17/20 0233 06/18/20 0254  HGB 14.3 14.0 13.7   Recent Labs    06/17/20 0233 06/18/20 0254  WBC 16.3* 16.0*  RBC 4.51 4.35  HCT 40.3 38.5  PLT 176 169   No results for input(s): NA, K, CL, CO2, BUN, CREATININE, GLUCOSE, CALCIUM in the last 72 hours. No results for input(s): LABPT, INR in the last 72 hours.  Neurologically intact No results found.  Assessment/Plan: 3 Days Post-Op Procedure(s) (LRB): LEFT TOTAL KNEE ARTHROPLASTY (Left) Up with therapy Discharge to SNF  Tracy Johnston 06/18/2020, 12:11 PM

## 2020-06-18 NOTE — Plan of Care (Signed)

## 2020-06-18 NOTE — Progress Notes (Signed)
Physical Therapy Treatment Patient Details Name: Tracy Johnston MRN: [id number] DOB: [birth year] Today's Date: 06/18/2020    History of Present Illness Pt is a 70 y/o female s/p L TKA. PMH includes COPD, cervical cancer, a fib, HTN, tobbacco use, and bilateral THA.    PT Comments    Pt was seen for L TKA rehab, worked on transfers with immobilizer in place and struggled with controlling LLE to sit down in the brace.  Talked with pt about the need to continue to support the L knee, but also about working on strengthening.  Pt was quite tired after working on lifting hips from chair and shifting them.  Follow along to restore gait and transfers as needed.  Educate about safety and her therapy goals.  Follow Up Recommendations  Follow surgeon's recommendation for DC plan and follow-up therapies;Supervision/Assistance - 24 hour     Equipment Recommendations  Rolling walker with 5" wheels    Recommendations for Other Services       Precautions / Restrictions Precautions Precautions: Fall;Knee Precaution Booklet Issued: No Precaution Comments: Verbally reviewed knee precautions with pt. Restrictions Weight Bearing Restrictions: No LLE Weight Bearing: Weight bearing as tolerated    Mobility  Bed Mobility Overal bed mobility: Needs Assistance             General bed mobility comments: up in chair when PT arrived    Transfers Overall transfer level: Needs assistance Equipment used: Rolling walker (2 wheeled) Transfers: Sit to/from Stand;Lateral/Scoot Transfers Sit to Stand: Max assist;From elevated surface        Lateral/Scoot Transfers: Mod assist General transfer comment: pt requires two person help to fully stand and even after getting off chair declines to stand up and wb through LLE  Ambulation/Gait             General Gait Details: unable to step   Stairs             Wheelchair Mobility    Modified Rankin (Stroke Patients Only)       Balance  Overall balance assessment: Needs assistance Sitting-balance support: Feet supported;Bilateral upper extremity supported Sitting balance-Leahy Scale: Fair     Standing balance support: Bilateral upper extremity supported;During functional activity Standing balance-Leahy Scale: Poor                              Cognition Arousal/Alertness: Awake/alert Behavior During Therapy: Anxious;WFL for tasks assessed/performed Overall Cognitive Status: Within Functional Limits for tasks assessed                                 General Comments: Anxious and fearful of pain      Exercises      General Comments General comments (skin integrity, edema, etc.): pt was assisted to scoot and was able to laterally shift and scoot backward, reluctant to let PT assist with LLE in brace      Pertinent Vitals/Pain Pain Assessment: 0-10 Pain Score: 9  Pain Descriptors / Indicators: Grimacing;Guarding;Moaning Pain Intervention(s): Limited activity within patient's tolerance;Monitored during session;Premedicated before session;Repositioned    Home Living                      Prior Function            PT Goals (current goals can now be found in the care plan section) Acute Rehab PT Goals  Patient Stated Goal: home Progress towards PT goals: Not progressing toward goals - comment    Frequency    7X/week      PT Plan Current plan remains appropriate    Co-evaluation              AM-PAC PT "6 Clicks" Mobility   Outcome Measure  Help needed turning from your back to your side while in a flat bed without using bedrails?: A Little Help needed moving from lying on your back to sitting on the side of a flat bed without using bedrails?: A Lot Help needed moving to and from a bed to a chair (including a wheelchair)?: A Lot Help needed standing up from a chair using your arms (e.g., wheelchair or bedside chair)?: Total Help needed to walk in hospital  room?: Total Help needed climbing 3-5 steps with a railing? : Total 6 Click Score: 10    End of Session Equipment Utilized During Treatment: Gait belt;Left knee immobilizer Activity Tolerance: No increased pain;Patient limited by fatigue Patient left: in chair;with call bell/phone within reach;with chair alarm set Nurse Communication: Mobility status PT Visit Diagnosis: Unsteadiness on feet (R26.81);Difficulty in walking, not elsewhere classified (R26.2);Pain;Muscle weakness (generalized) (M62.81) Pain - Right/Left: Left Pain - part of body: Knee     Time: 3536-1443 PT Time Calculation (min) (ACUTE ONLY): 24 min  Charges:  $Therapeutic Activity: 23-37 mins            Ramond Dial 06/18/2020, 4:10 PM Mee Hives, PT MS Acute Rehab Dept. Number: [place] and [place]

## 2020-06-18 NOTE — Progress Notes (Signed)
Bedside shift report complete. Received patient awake,alert.orientedx4 and able to verbalize needs. NAD noted; respirations even on room air. Dressing to LLE c/d/i; knee immobilizer in place. Ted hose to RLE in place. Movement/sensation to all extremities noted. Whiteboard updated. All safety measures in place and personal belongings within reach.

## 2020-06-19 ENCOUNTER — Encounter (HOSPITAL_COMMUNITY): Payer: Self-pay | Admitting: Orthopaedic Surgery

## 2020-06-19 MED ORDER — ASPIRIN EC 81 MG PO TBEC
81.0000 mg | DELAYED_RELEASE_TABLET | Freq: Every day | ORAL | Status: DC
  Administered 2020-06-19 – 2020-06-20 (×2): 81 mg via ORAL
  Filled 2020-06-19 (×3): qty 1

## 2020-06-19 NOTE — Progress Notes (Addendum)
Physical Therapy Treatment Patient Details Name: Tracy Johnston MRN: [id number] DOB: Oct 07, 1950 Today's Date: 06/19/2020    History of Present Illness Pt is a 70 y/o female s/p L TKA. PMH includes COPD, cervical cancer, a fib, HTN, tobbacco use, and bilateral THA.    PT Comments    Pt supine in bed on arrival.  Pt soiled in urine as she reports bladder incontinence at baseline.  Pt performed exercises with no knee flexion ROM per MD.  Pt continues to move and progress slowly.  Performed stand pivot x 2 from surface to surface.  Continue to recommend snf placement.      Follow Up Recommendations  Follow surgeon's recommendation for DC plan and follow-up therapies;Supervision/Assistance - 24 hour     Equipment Recommendations  Rolling walker with 5" wheels    Recommendations for Other Services       Precautions / Restrictions Precautions Precautions: Fall;Knee Precaution Booklet Issued: No Precaution Comments: Verbally reviewed knee precautions with pt. Required Braces or Orthoses: Knee Immobilizer - Left Knee Immobilizer - Left: On when out of bed or walking Restrictions Weight Bearing Restrictions: Yes LLE Weight Bearing: Weight bearing as tolerated Other Position/Activity Restrictions: NO knee ROM, KI to be worn during ambulation.    Mobility  Bed Mobility Overal bed mobility: Needs Assistance Bed Mobility: Supine to Sit;Sit to Supine     Supine to sit: Supervision     General bed mobility comments: Cues for sequencing and hand placement, use of belt to advance LLEs to edge of bed.  Max VCs to move to this position with heavy guarding of LLE.    Transfers Overall transfer level: Needs assistance Equipment used: Rolling walker (2 wheeled) Transfers: Sit to/from Stand Sit to Stand: Min assist;From elevated surface         General transfer comment: Cues for sequencing and hand placement this session.  Pt required cues for L foot position as she cannot flex at this  time due to precautions set by MD.  Ambulation/Gait Ambulation/Gait assistance: Mod assist Gait Distance (Feet): 4 Feet (+ 6 ft) Assistive device: Rolling walker (2 wheeled) Gait Pattern/deviations: Step-to pattern;Shuffle;Antalgic;Trunk flexed Gait velocity: very, very slow   General Gait Details: Steps from bed to commode and from commode to recliner.  Poor foot clearance noted with cues for UE use to improve foot clearance.   Stairs             Wheelchair Mobility    Modified Rankin (Stroke Patients Only)       Balance Overall balance assessment: Needs assistance Sitting-balance support: Feet supported;Bilateral upper extremity supported Sitting balance-Leahy Scale: Fair       Standing balance-Leahy Scale: Poor Standing balance comment: external support required                            Cognition Arousal/Alertness: Awake/alert Behavior During Therapy: Anxious;WFL for tasks assessed/performed Overall Cognitive Status: Within Functional Limits for tasks assessed                                 General Comments: remains self limiting due to pain.      Exercises Total Joint Exercises Ankle Circles/Pumps: AROM;Both;10 reps;Supine Quad Sets: AROM;Left;10 reps;Supine Heel Slides:  (did not performed as MD reports to hold knee flexion.) Hip ABduction/ADduction: AAROM;10 reps;Left;Supine Straight Leg Raises: AAROM;Left;10 reps;Supine Goniometric ROM: unable to assess ROM at this time  due to restrictions set by MD.     General Comments      Pertinent Vitals/Pain Pain Assessment: 0-10 Pain Score: 8  Pain Location: L knee Pain Descriptors / Indicators: Grimacing;Guarding Pain Intervention(s): Monitored during session;Repositioned;Ice applied    Home Living                      Prior Function            PT Goals (current goals can now be found in the care plan section) Acute Rehab PT Goals Patient Stated Goal:  home Potential to Achieve Goals: Fair Progress towards PT goals: Progressing toward goals    Frequency    7X/week      PT Plan Current plan remains appropriate    Co-evaluation              AM-PAC PT "6 Clicks" Mobility   Outcome Measure  Help needed turning from your back to your side while in a flat bed without using bedrails?: A Little Help needed moving from lying on your back to sitting on the side of a flat bed without using bedrails?: A Little Help needed moving to and from a bed to a chair (including a wheelchair)?: A Little Help needed standing up from a chair using your arms (e.g., wheelchair or bedside chair)?: A Little Help needed to walk in hospital room?: A Lot Help needed climbing 3-5 steps with a railing? : A Lot 6 Click Score: 16    End of Session Equipment Utilized During Treatment: Left knee immobilizer Activity Tolerance: Patient limited by fatigue;Patient limited by pain Patient left: in chair;with call bell/phone within reach (RN reports no alarm is okay) Nurse Communication: Mobility status PT Visit Diagnosis: Unsteadiness on feet (R26.81);Difficulty in walking, not elsewhere classified (R26.2);Pain;Muscle weakness (generalized) (M62.81) Pain - Right/Left: Left Pain - part of body: Knee     Time: 1109-1150 PT Time Calculation (min) (ACUTE ONLY): 41 min  Charges:  $Gait Training: 8-22 mins $Therapeutic Exercise: 8-22 mins $Therapeutic Activity: 8-22 mins                     [name] , PTA Acute Rehabilitation Services Pager [phone] Office [phone]     Tracy Johnston 06/19/2020, 3:05 PM

## 2020-06-19 NOTE — Plan of Care (Signed)

## 2020-06-19 NOTE — Progress Notes (Signed)
Occupational Therapy Treatment Patient Details Name: Tracy Johnston MRN: [id number] DOB: 1950/11/18 Today's Date: 06/19/2020    History of present illness Pt is a 70 y/o female s/p L TKA. PMH includes COPD, cervical cancer, a fib, HTN, tobbacco use, and bilateral THA.   OT comments  Scottlynn is incrementally progressing towards her goals, she is limited by LLE pain with any movement. Pt reported 8/10 pain at the start of the session, RN aware, pt grimacing and yelling with any slight movement or physical touch of the L leg. Pt agreeable to re-position L knee, supported in extension. Anett may benefit from a leg lifter to increase indep in mobility. Pt completed BUE exercises while sitting supported in chair, see exercises below. Pt continues to benefit from acute OT to progress standing ADLs and mobility. D/c recommendation remains appropriate.    Follow Up Recommendations  SNF;Supervision/Assistance - 24 hour    Equipment Recommendations  Other (comment) (defer to next venue)       Precautions / Restrictions Precautions Precautions: Fall;Knee Precaution Booklet Issued: No Precaution Comments: Verbally reviewed knee precautions with pt. Required Braces or Orthoses: Knee Immobilizer - Left Knee Immobilizer - Left: On when out of bed or walking Restrictions Weight Bearing Restrictions: Yes LLE Weight Bearing: Weight bearing as tolerated Other Position/Activity Restrictions: NO knee ROM, KI to be worn during ambulation.       Mobility Bed Mobility Overal bed mobility: Needs Assistance Bed Mobility: Supine to Sit;Sit to Supine     Supine to sit: Supervision     General bed mobility comments: Cues for sequencing and hand placement, use of belt to advance LLEs to edge of bed.  Max VCs to move to this position with heavy guarding of LLE.    Transfers Overall transfer level: Needs assistance Equipment used: Rolling walker (2 wheeled) Transfers: Sit to/from Stand Sit to Stand: Min  assist;From elevated surface         General transfer comment: Cues for sequencing and hand placement this session.  Pt required cues for L foot position as she cannot flex at this time due to precautions set by MD.    Balance Overall balance assessment: Needs assistance Sitting-balance support: Feet supported;Bilateral upper extremity supported Sitting balance-Leahy Scale: Fair       Standing balance-Leahy Scale: Poor Standing balance comment: external support required          ADL either performed or assessed with clinical judgement   ADL Overall ADL's : Needs assistance/impaired      Functional mobility during ADLs: Moderate assistance;+2 for physical assistance;+2 for safety/equipment;Cueing for safety;Cueing for sequencing;Rolling walker General ADL Comments: pt declined need for any ADLs, and functional transer practice due to 8/10 pain in LLE. session focused on BUE HEP wtih level 1 theraband               Cognition Arousal/Alertness: Awake/alert Behavior During Therapy: Anxious;WFL for tasks assessed/performed Overall Cognitive Status: Within Functional Limits for tasks assessed        General Comments: remains self limiting due to pain.        Exercises Exercises: General Upper Extremity Total Joint Exercises Ankle Circles/Pumps: AROM;Both;10 reps;Supine Quad Sets: AROM;Left;10 reps;Supine Heel Slides:  (did not performed as MD reports to hold knee flexion.) Hip ABduction/ADduction: AAROM;10 reps;Left;Supine Straight Leg Raises: AAROM;Left;10 reps;Supine Goniometric ROM: unable to assess ROM at this time due to restrictions set by MD. General Exercises - Upper Extremity Shoulder ABduction: AROM;10 reps;Seated;Theraband;Both Theraband Level (Shoulder Abduction): Level 1 (Yellow) Elbow  Flexion: AROM;Both;10 reps;Seated;Theraband Theraband Level (Elbow Flexion): Level 1 (Yellow) Elbow Extension: AROM;Both;10 reps;Seated;Theraband Theraband Level (Elbow  Extension): Level 1 (Yellow)      General Comments Pt in recliner with slight flexion at L knee upon arrival, grimacing and yelling with sligh motion or physical touch of LLE; pt agreeable to better position L knee in extension with pillows and work on BUE HEP    Pertinent Vitals/ Pain       Pain Assessment: 0-10 Pain Score: 8  Pain Location: L knee Pain Descriptors / Indicators: Grimacing;Guarding Pain Intervention(s): Monitored during session;Repositioned;Ice applied         Frequency  Min 2X/week        Progress Toward Goals  OT Goals(current goals can now be found in the care plan section)  Progress towards OT goals: Progressing toward goals  Acute Rehab OT Goals Patient Stated Goal: home OT Goal Formulation: With patient Time For Goal Achievement: 06/30/20 Potential to Achieve Goals: Good ADL Goals Pt Will Perform Grooming: with min assist;standing Pt Will Perform Lower Body Bathing: with min assist;sitting/lateral leans;sit to/from stand Pt Will Perform Lower Body Dressing: with min assist;sitting/lateral leans;sit to/from stand Pt Will Transfer to Toilet: with min assist;ambulating;regular height toilet;grab bars Pt Will Perform Toileting - Clothing Manipulation and hygiene: with min assist;sitting/lateral leans;sit to/from stand Pt Will Perform Tub/Shower Transfer: Tub transfer;Shower transfer;with min assist;ambulating;shower seat;grab bars;rolling walker Pt/caregiver will Perform Home Exercise Program: Increased strength;Both right and left upper extremity;With Supervision Additional ADL Goal #1: Pt will verbalize/demonstrate 3 fall prevention strategies to incorporate into ADLs/ADL mobility with min assist overall. Additional ADL Goal #2: Pt will verbalize/demonstrate WBAT LLE precautions and safe functional mobility transfers in order to participate in OOB ADLs with min assist overall.  Plan Discharge plan remains appropriate       AM-PAC OT "6 Clicks" Daily  Activity     Outcome Measure   Help from another person eating meals?: None Help from another person taking care of personal grooming?: A Little Help from another person toileting, which includes using toliet, bedpan, or urinal?: A Lot Help from another person bathing (including washing, rinsing, drying)?: A Lot Help from another person to put on and taking off regular upper body clothing?: A Little Help from another person to put on and taking off regular lower body clothing?: A Lot 6 Click Score: 16    End of Session Equipment Utilized During Treatment: Gait belt  OT Visit Diagnosis: Unsteadiness on feet (R26.81);Muscle weakness (generalized) (M62.81);Pain Pain - Right/Left: Left Pain - part of body: Leg   Activity Tolerance Patient limited by pain   Patient Left in chair;with chair alarm set;with call bell/phone within reach   Nurse Communication Mobility status        Time: 1357-1410 OT Time Calculation (min): 13 min  Charges: OT General Charges $OT Visit: 1 Visit OT Treatments $Therapeutic Exercise: 8-22 mins    [place] Coupland A Satish Hammers 06/19/2020, 3:16 PM

## 2020-06-19 NOTE — Progress Notes (Signed)
Bedside shift report complete. Received patient awake,alert.orientedx4 and able to verbalize needs. NAD noted; respirations even on room air. Dressing to LLE c/d/i; some redness noted. Movement/sensation to all extremities noted. Whiteboard updated. All safety measures in place and personal belongings within reach.

## 2020-06-19 NOTE — Progress Notes (Addendum)
   Subjective: 4 Days Post-Op Procedure(s) (LRB): LEFT TOTAL KNEE ARTHROPLASTY (Left) Patient reports pain as moderate and severe.    Objective: Vital signs in last 24 hours: Temp:  [98.2 F (36.8 C)-98.6 F (37 C)] 98.3 F (36.8 C) (05/24 0805) Pulse Rate:  [76-88] 77 (05/24 0805) Resp:  [17-20] 17 (05/24 0805) BP: (94-132)/(61-74) 117/67 (05/24 0805) SpO2:  [92 %-97 %] 93 % (05/24 0805)  Intake/Output from previous day: 05/23 0701 - 05/24 0700 In: -  Out: 900 [Urine:900] Intake/Output this shift: No intake/output data recorded.  Recent Labs    06/17/20 0233 06/18/20 0254  HGB 14.0 13.7   Recent Labs    06/17/20 0233 06/18/20 0254  WBC 16.3* 16.0*  RBC 4.51 4.35  HCT 40.3 38.5  PLT 176 169   No results for input(s): NA, K, CL, CO2, BUN, CREATININE, GLUCOSE, CALCIUM in the last 72 hours. No results for input(s): LABPT, INR in the last 72 hours.  incision red, had ice directly on skin, no mepilex on incision. sub Q blood and clotted blood removed adherent to staples.  No results found.  Assessment/Plan: 4 Days Post-Op Procedure(s) (LRB): LEFT TOTAL KNEE ARTHROPLASTY (Left) Plan:  New mepilex placed, once to go home but not ambulatory and not safe. SNF. Will hold xarelto times 3 days due to wound sub Q hematoma. Risks of afib and PE vs wound dehiscence and infection discussed.   Tracy Johnston 06/19/2020, 8:52 AM

## 2020-06-19 NOTE — Progress Notes (Signed)
Physical Therapy Treatment Patient Details Name: Tracy Johnston MRN: [id number] DOB: 05-Jan-1951 Today's Date: 06/19/2020    History of Present Illness Pt is a 70 y/o female s/p L TKA. PMH includes COPD, cervical cancer, a fib, HTN, tobbacco use, and bilateral THA.    PT Comments    Pt reclined in recliner on arrival.  Assisted patient into standing and for steps back to bed.  Pain remains to limit function but she is moving with decreased assistance.  Continue to follow surgeons recommendations for d/c.     Follow Up Recommendations  Follow surgeon's recommendation for DC plan and follow-up therapies;Supervision/Assistance - 24 hour     Equipment Recommendations  Rolling walker with 5" wheels    Recommendations for Other Services       Precautions / Restrictions Precautions Precautions: Fall;Knee Precaution Booklet Issued: No Precaution Comments: Verbally reviewed knee precautions with pt. Required Braces or Orthoses: Knee Immobilizer - Left Knee Immobilizer - Left: On when out of bed or walking Restrictions Weight Bearing Restrictions: Yes LLE Weight Bearing: Weight bearing as tolerated Other Position/Activity Restrictions: NO knee ROM, KI to be worn during ambulation.    Mobility  Bed Mobility Overal bed mobility: Needs Assistance Bed Mobility: Sit to Supine      Sit to supine: Mod assist   General bed mobility comments: assistance to lift B LEs into bed against gravity to position to supine.  Pt very guarded due to pain.    Transfers Overall transfer level: Needs assistance Equipment used: Rolling walker (2 wheeled) Transfers: Sit to/from Stand Sit to Stand: Mod assist Stand pivot transfers: Min assist       General transfer comment: Mod assistance to move into standing from recliner. Cues for hand and foot placement and weight shift forward.  Ambulation/Gait Ambulation/Gait assistance: Mod assist Gait Distance (Feet): 4 Feet (to move from recliner back to  bed.) Assistive device: Rolling walker (2 wheeled) Gait Pattern/deviations: Step-to pattern;Shuffle;Antalgic;Trunk flexed Gait velocity: very, very slow   General Gait Details: Pt performed shuffling steps from recliner back to bed. Decreased weight shift to L noted.   Stairs             Wheelchair Mobility    Modified Rankin (Stroke Patients Only)       Balance Overall balance assessment: Needs assistance Sitting-balance support: Feet supported;Bilateral upper extremity supported Sitting balance-Leahy Scale: Fair       Standing balance-Leahy Scale: Poor Standing balance comment: external support required                            Cognition Arousal/Alertness: Awake/alert Behavior During Therapy: Anxious;WFL for tasks assessed/performed Overall Cognitive Status: Within Functional Limits for tasks assessed                                 General Comments: remains self limiting due to pain.      Exercises    General Comments      Pertinent Vitals/Pain Pain Assessment: 0-10 Pain Score: 8  Pain Location: L knee Pain Descriptors / Indicators: Grimacing;Guarding Pain Intervention(s): Monitored during session;Repositioned    Home Living                      Prior Function            PT Goals (current goals can now be found in the care plan  section) Acute Rehab PT Goals Patient Stated Goal: home Potential to Achieve Goals: Fair Progress towards PT goals: Progressing toward goals    Frequency    7X/week      PT Plan Current plan remains appropriate    Co-evaluation              AM-PAC PT "6 Clicks" Mobility   Outcome Measure  Help needed turning from your back to your side while in a flat bed without using bedrails?: A Little Help needed moving from lying on your back to sitting on the side of a flat bed without using bedrails?: A Little Help needed moving to and from a bed to a chair (including a  wheelchair)?: A Little Help needed standing up from a chair using your arms (e.g., wheelchair or bedside chair)?: A Little Help needed to walk in hospital room?: A Lot Help needed climbing 3-5 steps with a railing? : A Lot 6 Click Score: 16    End of Session Equipment Utilized During Treatment: Left knee immobilizer Activity Tolerance: Patient limited by fatigue;Patient limited by pain Patient left: in bed;with call bell/phone within reach;with bed alarm set Nurse Communication: Mobility status PT Visit Diagnosis: Unsteadiness on feet (R26.81);Difficulty in walking, not elsewhere classified (R26.2);Pain;Muscle weakness (generalized) (M62.81) Pain - Right/Left: Left Pain - part of body: Knee     Time: 1287-8676 PT Time Calculation (min) (ACUTE ONLY): 14 min  Charges:  $Therapeutic Activity: 8-22 mins                     [name] , PTA Acute Rehabilitation Services Pager [phone] Office [phone]     Yama Nielson [name] 06/19/2020, 5:19 PM

## 2020-06-19 NOTE — TOC Progression Note (Signed)
Transition of Care [facility]) - Progression Note    Patient Details  Name: Tracy Johnston MRN: [id number] Date of Birth: [birth year]  Transition of Care [facility]) CM/SW Contact  Milinda Antis, [place] Phone Number: 06/19/2020, 3:24 PM  Clinical Narrative:    CSW received notification that the plan has changed from home with [facility] to SNF.  CSW met with the patient and the patient agreed to go to a SNF for rehab and only wants to go to [facility].  CSW explained the insurance process and asked if the patient would like for CSW to send information to any other agencies and he patient declined.   Expected Discharge Plan: Skilled Nursing Facility Barriers to Discharge: Insurance Authorization,SNF Pending bed offer  Expected Discharge Plan and Services Expected Discharge Plan: [place]   Discharge Planning Services: CM Consult Post Acute Care Choice: [place] arrangements for the past 2 months: Single Family Home                                       Social Determinants of Health (SDOH) Interventions    Readmission Risk Interventions No flowsheet data found.

## 2020-06-19 NOTE — NC FL2 (Signed)
[place] LEVEL OF CARE SCREENING TOOL     IDENTIFICATION  Patient Name: Tracy Johnston Birthdate: 1950-09-27 Sex: female Admission Date (Current Location): 06/15/2020  [facility] and [place] Number:  Herbalist and Address:         Provider Number:    Attending Physician Name and Address:  [name], MD  Relative Name and Phone Number:  Toney Reil (Daughter)   [phone]    Current Level of Care: Hospital Recommended Level of Care: [place] Prior Approval Number:    Date Approved/Denied: 06/19/20 PASRR Number: [id number]  Discharge Plan: SNF    Current Diagnoses: Patient Active Problem List   Diagnosis Date Noted  . 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 Unilateral primary osteoarthritis, left knee 04/26/2020  . BMI 33.0-33.9,adult 08/12/2018  . HLD (hyperlipidemia) 08/12/2018  . LLQ abdominal pain 08/12/2018  . Mixed stress and urge urinary incontinence 08/12/2018  . Bilateral primary osteoarthritis of knee 08/12/2018  . History of bilateral hip arthroplasty 07/09/2017  . Long-term current use of opiate analgesic 02/18/2017  . On long term drug therapy 02/06/2017  . Chronic low back pain 02/06/2017  . Chronic pain syndrome 02/06/2017  . Pain in left knee 11/22/2015  . Incisional hernia, without obstruction or gangrene 09/09/2012  . Acute blood loss anemia 10/20/2011  . COPD (chronic obstructive pulmonary disease) ([place]) 03/03/2011  . Dyspnea 12/02/2010  . Preop cardiovascular exam 10/31/2010  . Claudication ([place]) 10/18/2010  . Atrial fibrillation ([place])   . Tobacco abuse   . Hypertension     Orientation RESPIRATION BLADDER Height & Weight     Place,Situation,Time,Self  Normal Continent Weight: 205 lb (93 kg) Height:  5\' 4"  (162.6 cm)  BEHAVIORAL SYMPTOMS/MOOD NEUROLOGICAL BOWEL NUTRITION STATUS      Continent Diet (see d/c summary)  AMBULATORY STATUS COMMUNICATION OF NEEDS Skin   Limited Assist  Verbally Surgical wounds                       Personal Care Assistance Level of Assistance  Bathing,Feeding,Dressing Bathing Assistance: Limited assistance Feeding assistance: Independent Dressing Assistance: Limited assistance     Functional Limitations Info  Sight,Hearing,Speech Sight Info: Impaired Hearing Info: Adequate Speech Info: Adequate    SPECIAL CARE FACTORS FREQUENCY  PT (By licensed PT),OT (By licensed OT)     PT Frequency: 5x/ week OT Frequency: 5x/week            Contractures Contractures Info: Present    Additional Factors Info  Code Status,Allergies Code Status Info: Full Allergies Info: Bupropion, Aspirin, Latex           Current Medications (06/19/2020):  This is the current hospital active medication list Current Facility-Administered Medications  Medication Dose Route Frequency Provider Last Rate Last Admin  . 0.9 %  sodium chloride infusion   Intravenous Continuous [name], MD   Stopped at 06/16/20 1200  . acetaminophen (TYLENOL) tablet 325-650 mg  325-650 mg Oral Q6H PRN [name], MD      . albuterol (VENTOLIN HFA) 108 (90 Base) MCG/ACT inhaler 2 puff  2 puff Inhalation Q4H PRN [name], MD      . allopurinol (ZYLOPRIM) tablet 300 mg  300 mg Oral Daily [name], MD   300 mg at 06/19/20 1011  . aspirin EC tablet 81 mg  81 mg Oral Daily [name], MD   81 mg at 06/19/20 1011  . diltiazem (CARDIZEM  CD) 24 hr capsule 240 mg  240 mg Oral Daily [name], MD   240 mg at 06/19/20 1011  . diphenhydrAMINE (BENADRYL) 12.5 MG/5ML elixir 12.5-25 mg  12.5-25 mg Oral Q4H PRN [name], MD      . docusate sodium (COLACE) capsule 100 mg  100 mg Oral BID [name], MD   100 mg at 06/19/20 1011  . ferrous sulfate tablet 325 mg  325 mg Oral TID PC [name], MD   325 mg at 06/19/20 1242  . fluticasone furoate-vilanterol (BREO ELLIPTA) 100-25 MCG/INH 1 puff  1 puff Inhalation Daily [name], MD      .  hydrochlorothiazide (MICROZIDE) capsule 12.5 mg  12.5 mg Oral Daily [name], MD   12.5 mg at 06/19/20 1011  . HYDROcodone-acetaminophen (NORCO) 7.5-325 MG per tablet 1-2 tablet  1-2 tablet Oral Q4H PRN [name], MD   2 tablet at 06/19/20 1424  . ipratropium-albuterol (DUONEB) 0.5-2.5 (3) MG/3ML nebulizer solution 3 mL  3 mL Inhalation Q6H PRN [name], MD      . irbesartan (AVAPRO) tablet 300 mg  300 mg Oral Daily [name], MD   300 mg at 06/19/20 1011  . menthol-cetylpyridinium (CEPACOL) lozenge 3 mg  1 lozenge Oral PRN [name], MD       Or  . phenol (CHLORASEPTIC) mouth spray 1 spray  1 spray Mouth/Throat PRN [name], MD      . metoCLOPramide (REGLAN) tablet 5-10 mg  5-10 mg Oral Q8H PRN [name], MD       Or  . metoCLOPramide (REGLAN) injection 5-10 mg  5-10 mg Intravenous Q8H PRN [name], MD      . mirabegron ER [facility]) tablet 50 mg  50 mg Oral Daily [name], MD   50 mg at 06/19/20 1011  . montelukast (SINGULAIR) tablet 10 mg  10 mg Oral QHS [name], MD   10 mg at 06/18/20 2136  . morphine (MSIR) tablet 30 mg  30 mg Oral Q6H PRN Hammons, Kimberly B, RPH   30 mg at 06/18/20 1301  . morphine 2 MG/ML injection 0.5-1 mg  0.5-1 mg Intravenous Q2H PRN [name], MD   1 mg at 06/16/20 1709  . ondansetron (ZOFRAN) tablet 4 mg  4 mg Oral Q6H PRN [name], MD       Or  . ondansetron [facility]) injection 4 mg  4 mg Intravenous Q6H PRN [name], MD      . polyethylene glycol (MIRALAX / GLYCOLAX) packet 17 g  17 g Oral Magdalen Spatz, MD   17 g at 06/19/20 1010  . predniSONE (DELTASONE) tablet 20 mg  20 mg Oral Daily PRN [name], MD      . simvastatin (ZOCOR) tablet 20 mg  20 mg Oral Daily [name], MD   20 mg at 06/19/20 1011  . umeclidinium bromide (INCRUSE ELLIPTA) 62.5 MCG/INH 1 puff  1 puff Inhalation Daily [name], MD         Discharge Medications: Please see discharge summary for a list of discharge  medications.  Relevant Imaging Results:  Relevant Lab Results:   Additional Information Moderna COVID-19 Vaccine 04/01/2019 , 03/04/2019 ; SSN: [id number]  Omario Ander F Nitza Schmid, LCSWA

## 2020-06-20 ENCOUNTER — Inpatient Hospital Stay (HOSPITAL_COMMUNITY): Payer: Medicare HMO

## 2020-06-20 DIAGNOSIS — M79661 Pain in right lower leg: Secondary | ICD-10-CM

## 2020-06-20 DIAGNOSIS — L538 Other specified erythematous conditions: Secondary | ICD-10-CM

## 2020-06-20 LAB — SARS CORONAVIRUS 2 (TAT 6-24 HRS): SARS Coronavirus 2: NEGATIVE

## 2020-06-20 MED ORDER — RIVAROXABAN 20 MG PO TABS: 20.0000 mg | ORAL_TABLET | Freq: Every day | ORAL | 0 refills | Status: DC

## 2020-06-20 MED ORDER — OXYMORPHONE HCL 10 MG PO TABS: 10.0000 mg | ORAL_TABLET | Freq: Three times a day (TID) | ORAL | 0 refills | Status: DC | PRN

## 2020-06-20 NOTE — Progress Notes (Signed)
Bedside shift report complete. Received patient awake,alert.orientedx4 and able to verbalize needs. NAD noted; respirations even on room air. Dressing to LLE c/d/i; some redness noted. Movement/sensation to all extremities noted. Whiteboard updated. All safety measures in place and personal belongings within reach.

## 2020-06-20 NOTE — Progress Notes (Signed)
Physical Therapy Treatment Patient Details Name: Tracy Johnston MRN: [id number] DOB: 1950/03/06 Today's Date: 06/20/2020    History of Present Illness Pt is a 70 y/o female s/p L TKA. PMH includes COPD, cervical cancer, a fib, HTN, tobbacco use, and bilateral THA.    PT Comments    Pt seated in recliner.  Performed increased gt distance with max cues.  Continues to require min assistance overall.  Plan to follow surgeons d/c recs.   Follow Up Recommendations  Follow surgeon's recommendation for DC plan and follow-up therapies;Supervision/Assistance - 24 hour     Equipment Recommendations  Rolling walker with 5" wheels    Recommendations for Other Services       Precautions / Restrictions Precautions Precautions: Fall;Knee Precaution Booklet Issued: No Precaution Comments: Verbally reviewed knee precautions with pt. Required Braces or Orthoses: Knee Immobilizer - Left Knee Immobilizer - Right: On when out of bed or walking Knee Immobilizer - Left: On when out of bed or walking Restrictions Weight Bearing Restrictions: Yes LLE Weight Bearing: Weight bearing as tolerated Other Position/Activity Restrictions: NO knee ROM, KI to be worn during ambulation.    Mobility  Bed Mobility Overal bed mobility: Needs Assistance             General bed mobility comments: min assistance to lower leg from recliner.    Transfers Overall transfer level: Needs assistance Equipment used: Rolling walker (2 wheeled) Transfers: Sit to/from Stand Sit to Stand: Supervision;Min guard         General transfer comment: Min guard to rise from recliner and commode.  Cues for hand and foot placement.  Transfers continue to improve.  Ambulation/Gait Ambulation/Gait assistance: Min guard Gait Distance (Feet): 40 Feet Assistive device: Rolling walker (2 wheeled) Gait Pattern/deviations: Step-to pattern;Antalgic;Trunk flexed;Decreased stride length;Decreased stance time - left     General  Gait Details: Cues for forward gaze, upper trunk control and RW safety.  Pt flexed over RW when fatigued.  Max cues for encouragement to improve gt distance.   Stairs             Wheelchair Mobility    Modified Rankin (Stroke Patients Only)       Balance Overall balance assessment: Needs assistance Sitting-balance support: Feet supported;Bilateral upper extremity supported Sitting balance-Leahy Scale: Fair       Standing balance-Leahy Scale: Poor                              Cognition Arousal/Alertness: Awake/alert Behavior During Therapy: Anxious;WFL for tasks assessed/performed Overall Cognitive Status: Within Functional Limits for tasks assessed                                 General Comments: remains self limiting due to pain.      Exercises      General Comments        Pertinent Vitals/Pain Pain Score: 6  Pain Location: L knee Pain Descriptors / Indicators: Grimacing;Guarding Pain Intervention(s): Monitored during session;Repositioned    Home Living                      Prior Function            PT Goals (current goals can now be found in the care plan section) Acute Rehab PT Goals Patient Stated Goal: home Potential to Achieve Goals: Fair Progress towards PT goals:  Progressing toward goals    Frequency    7X/week      PT Plan Current plan remains appropriate    Co-evaluation              AM-PAC PT "6 Clicks" Mobility   Outcome Measure  Help needed turning from your back to your side while in a flat bed without using bedrails?: A Little Help needed moving from lying on your back to sitting on the side of a flat bed without using bedrails?: A Little Help needed moving to and from a bed to a chair (including a wheelchair)?: A Little Help needed standing up from a chair using your arms (e.g., wheelchair or bedside chair)?: A Little Help needed to walk in hospital room?: A Little Help needed  climbing 3-5 steps with a railing? : A Little 6 Click Score: 18    End of Session Equipment Utilized During Treatment: Left knee immobilizer;Gait belt Activity Tolerance: Patient limited by fatigue;Patient limited by pain Patient left: with call bell/phone within reach;in chair Nurse Communication: Mobility status PT Visit Diagnosis: Unsteadiness on feet (R26.81);Difficulty in walking, not elsewhere classified (R26.2);Pain;Muscle weakness (generalized) (M62.81) Pain - Right/Left: Left Pain - part of body: Knee     Time: 1432-1500 PT Time Calculation (min) (ACUTE ONLY): 28 min  Charges:  $Gait Training: 8-22 mins $Therapeutic Activity: 8-22 mins                     [name] , PTA Acute Rehabilitation Services Pager [phone] Office [phone]     [name] 06/20/2020, 4:36 PM

## 2020-06-20 NOTE — Discharge Instructions (Signed)
INSTRUCTIONS AFTER JOINT REPLACEMENT   o Remove items at home which could result in a fall. This includes throw rugs or furniture in walking pathways o ICE to the affected joint every three hours while awake for 30 minutes at a time, for at least the first 3-5 days, and then as needed for pain and swelling.  Continue to use ice for pain and swelling. You may notice swelling that will progress down to the foot and ankle.  This is normal after surgery.  Elevate your leg when you are not up walking on it.   o Continue to use the breathing machine you got in the hospital (incentive spirometer) which will help keep your temperature down.  It is common for your temperature to cycle up and down following surgery, especially at night when you are not up moving around and exerting yourself.  The breathing machine keeps your lungs expanded and your temperature down.   DIET:  As you were doing prior to hospitalization, we recommend a well-balanced diet.  DRESSING / WOUND CARE / SHOWERING  You may change your dressing 3-5 days after surgery.  Then change the dressing every day with sterile gauze.  Please use good hand washing techniques before changing the dressing.  Do not use any lotions or creams on the incision until instructed by your surgeon.  ACTIVITY  o Increase activity slowly as tolerated, but follow the weight bearing instructions below.   o No driving for 6 weeks or until further direction given by your physician.  You cannot drive while taking narcotics.  o No lifting or carrying greater than 10 lbs. until further directed by your surgeon. o Avoid periods of inactivity such as sitting longer than an hour when not asleep. This helps prevent blood clots.  o You may return to work once you are authorized by your doctor.     WEIGHT BEARING   Weight bearing as tolerated with assist device (walker, cane, etc) as directed, use it as long as suggested by your surgeon or therapist, typically at  least 4-6 weeks.   EXERCISES  Results after joint replacement surgery are often greatly improved when you follow the exercise, range of motion and muscle strengthening exercises prescribed by your doctor. Safety measures are also important to protect the joint from further injury. Any time any of these exercises cause you to have increased pain or swelling, decrease what you are doing until you are comfortable again and then slowly increase them. If you have problems or questions, call your caregiver or physical therapist for advice.   Rehabilitation is important following a joint replacement. After just a few days of immobilization, the muscles of the leg can become weakened and shrink (atrophy).  These exercises are designed to build up the tone and strength of the thigh and leg muscles and to improve motion. Often times heat used for twenty to thirty minutes before working out will loosen up your tissues and help with improving the range of motion but do not use heat for the first two weeks following surgery (sometimes heat can increase post-operative swelling).   These exercises can be done on a training (exercise) mat, on the floor, on a table or on a bed. Use whatever works the best and is most comfortable for you.    Use music or television while you are exercising so that the exercises are a pleasant break in your day. This will make your life better with the exercises acting as a break   in your routine that you can look forward to.   Perform all exercises about fifteen times, three times per day or as directed.  You should exercise both the operative leg and the other leg as well.  Exercises include:   . Quad Sets - Tighten up the muscle on the front of the thigh (Quad) and hold for 5-10 seconds.   . Straight Leg Raises - With your knee straight (if you were given a brace, keep it on), lift the leg to 60 degrees, hold for 3 seconds, and slowly lower the leg.  Perform this exercise against  resistance later as your leg gets stronger.  . Leg Slides: Lying on your back, slowly slide your foot toward your buttocks, bending your knee up off the floor (only go as far as is comfortable). Then slowly slide your foot back down until your leg is flat on the floor again.  . Angel Wings: Lying on your back spread your legs to the side as far apart as you can without causing discomfort.  . Hamstring Strength:  Lying on your back, push your heel against the floor with your leg straight by tightening up the muscles of your buttocks.  Repeat, but this time bend your knee to a comfortable angle, and push your heel against the floor.  You may put a pillow under the heel to make it more comfortable if necessary.   A rehabilitation program following joint replacement surgery can speed recovery and prevent re-injury in the future due to weakened muscles. Contact your doctor or a physical therapist for more information on knee rehabilitation.    CONSTIPATION  Constipation is defined medically as fewer than three stools per week and severe constipation as less than one stool per week.  Even if you have a regular bowel pattern at home, your normal regimen is likely to be disrupted due to multiple reasons following surgery.  Combination of anesthesia, postoperative narcotics, change in appetite and fluid intake all can affect your bowels.   YOU MUST use at least one of the following options; they are listed in order of increasing strength to get the job done.  They are all available over the counter, and you may need to use some, POSSIBLY even all of these options:    Drink plenty of fluids (prune juice may be helpful) and high fiber foods Colace 100 mg by mouth twice a day  Senokot for constipation as directed and as needed Dulcolax (bisacodyl), take with full glass of water  Miralax (polyethylene glycol) once or twice a day as needed.  If you have tried all these things and are unable to have a bowel  movement in the first 3-4 days after surgery call either your surgeon or your primary doctor.    If you experience loose stools or diarrhea, hold the medications until you stool forms back up.  If your symptoms do not get better within 1 week or if they get worse, check with your doctor.  If you experience "the worst abdominal pain ever" or develop nausea or vomiting, please contact the office immediately for further recommendations for treatment.   ITCHING:  If you experience itching with your medications, try taking only a single pain pill, or even half a pain pill at a time.  You can also use Benadryl over the counter for itching or also to help with sleep.   TED HOSE STOCKINGS:  Use stockings on both legs until for at least 2 weeks or as   directed by physician office. They may be removed at night for sleeping.  MEDICATIONS:  See your medication summary on the "After Visit Summary" that nursing will review with you.  You may have some home medications which will be placed on hold until you complete the course of blood thinner medication.  It is important for you to complete the blood thinner medication as prescribed.  PRECAUTIONS:  If you experience chest pain or shortness of breath - call 911 immediately for transfer to the hospital emergency department.   If you develop a fever greater that 101 F, purulent drainage from wound, increased redness or drainage from wound, foul odor from the wound/dressing, or calf pain - CONTACT YOUR SURGEON.                                                   FOLLOW-UP APPOINTMENTS:  If you do not already have a post-op appointment, please call the office for an appointment to be seen by your surgeon.  Guidelines for how soon to be seen are listed in your "After Visit Summary", but are typically between 1-4 weeks after surgery.  OTHER INSTRUCTIONS:   Knee Replacement:  Do not place pillow under knee, focus on keeping the knee straight while resting. CPM  instructions: 0-90 degrees, 2 hours in the morning, 2 hours in the afternoon, and 2 hours in the evening. Place foam block, curve side up under heel at all times except when in CPM or when walking.  DO NOT modify, tear, cut, or change the foam block in any way.  POST-OPERATIVE OPIOID TAPER INSTRUCTIONS: . It is important to wean off of your opioid medication as soon as possible. If you do not need pain medication after your surgery it is ok to stop day one. Mar[place] Kitchen Opioids include: o Codeine, Hydrocodone(Norco, Vicodin), Oxycodone(Percocet, oxycontin) and hydromorphone amongst others.  . Long term and even short term use of opiods can cause: o Increased pain response o Dependence o Constipation o Depression o Respiratory depression o And more.  . Withdrawal symptoms can include o Flu like symptoms o Nausea, vomiting o And more . Techniques to manage these symptoms o Hydrate well o Eat regular healthy meals o Stay active o Use relaxation techniques(deep breathing, meditating, yoga) . Do Not substitute Alcohol to help with tapering . If you have been on opioids for less than two weeks and do not have pain than it is ok to stop all together.  . Plan to wean off of opioids o This plan should start within one week post op of your joint replacement. o Maintain the same interval or time between taking each dose and first decrease the dose.  o Cut the total daily intake of opioids by one tablet each day o Next start to increase the time between doses. o The last dose that should be eliminated is the evening dose.     MAKE SURE YOU:  . Understand these instructions.  . Get help right away if you are not doing well or get worse.    Thank you for letting us be a part of your medical care team.  It is a privilege we respect greatly.  We hope these instructions will help you stay on track for a fast and full recovery!    Dental Antibiotics:  In most cases prophylactic antibiotics  for Dental  procdeures after total joint surgery are not necessary.  Exceptions are as follows:  1. History of prior total joint infection  2. Severely immunocompromised (Organ Transplant, cancer chemotherapy, Rheumatoid biologic meds such as [place])  3. Poorly controlled diabetes (A1C &gt; 8.0, blood glucose over 200)  If you have one of these conditions, contact your surgeon for an antibiotic prescription, prior to your dental procedure.

## 2020-06-20 NOTE — Progress Notes (Signed)
Subjective: Doing well.  C/o left calf and knee pain.  No c/o cp, sob.    Objective: Vital signs in last 24 hours: Temp:  [97.9 F (36.6 C)-98.5 F (36.9 C)] 97.9 F (36.6 C) (05/25 1338) Pulse Rate:  [86-88] 86 (05/25 1338) Resp:  [18] 18 (05/25 1338) BP: (98-122)/(61-72) 98/61 (05/25 1338) SpO2:  [94 %] 94 % (05/25 1338)  Intake/Output from previous day: 05/24 0701 - 05/25 0700 In: -  Out: 900 [Urine:900] Intake/Output this shift: Total I/O In: 480 [P.O.:480] Out: 500 [Urine:500]  Recent Labs    06/18/20 0254  HGB 13.7   Recent Labs    06/18/20 0254  WBC 16.0*  RBC 4.35  HCT 38.5  PLT 169   No results for input(s): NA, K, CL, CO2, BUN, CREATININE, GLUCOSE, CALCIUM in the last 72 hours. No results for input(s): LABPT, INR in the last 72 hours.  Exam: Pleasant female, alert and oriented. NAD.  Left knee incision looks good. Some anterior knee redness.  No signs of infection.  Left knee swollen with diffuse tenderness.  Left calf swelling with marked tenderness.  Patient seen and examined with Dr Lorin Mercy.     Assessment/Plan: -Will order stat venous doppler left LE r/o DVT.   -Possible transfer to SNF tomorrow.   -Per Dr Lorin Mercy we will restart patient's xarelto for A-fib tomorrow. -Patient was started on aspirin 81mg  for dvt prophylaxis due to risk of postop hemarthrosis.      Benjiman Core 06/20/2020, 3:48 PM

## 2020-06-20 NOTE — Progress Notes (Signed)
Lower extremity venous LT study completed.  Preliminary results relayed to [place], RN.   See CV Proc for preliminary results report.   Darlin Coco, RDMS, RVT

## 2020-06-20 NOTE — Progress Notes (Signed)
Physical Therapy Treatment Patient Details Name: Tracy Johnston MRN: [id number] DOB: 21-May-1950 Today's Date: 06/20/2020    History of Present Illness Pt is a 70 y/o female s/p L TKA. PMH includes COPD, cervical cancer, a fib, HTN, tobbacco use, and bilateral THA.    PT Comments    Pt making progress this session and able to advance to 25 ft of gt training this morning.  Pt remains slow and guarded due to pain.  Knee immobilizer used for OOB activity as she is not to flex her knee per MD orders.  Continue to follow surgeons recommendations for d/c.     Follow Up Recommendations  Follow surgeon's recommendation for DC plan and follow-up therapies;Supervision/Assistance - 24 hour     Equipment Recommendations  Rolling walker with 5" wheels    Recommendations for Other Services       Precautions / Restrictions Precautions Precautions: Fall;Knee Precaution Booklet Issued: No Precaution Comments: Verbally reviewed knee precautions with pt. Required Braces or Orthoses: Knee Immobilizer - Left Knee Immobilizer - Right: On when out of bed or walking Knee Immobilizer - Left: On when out of bed or walking Restrictions Weight Bearing Restrictions: Yes LLE Weight Bearing: Weight bearing as tolerated Other Position/Activity Restrictions: NO knee ROM, KI to be worn during ambulation.    Mobility  Bed Mobility               General bed mobility comments: Pt seated in recliner on arrival this session.    Transfers Overall transfer level: Needs assistance Equipment used: Rolling walker (2 wheeled) Transfers: Sit to/from Stand Sit to Stand: Min guard         General transfer comment: Cues for hand placement and foot placement to move into standing.  min guard to achieve standing required min assistance to manage LE when scooting to edge of recliner.  Ambulation/Gait Ambulation/Gait assistance: Min assist Gait Distance (Feet): 25 Feet Assistive device: Rolling walker (2  wheeled) Gait Pattern/deviations: Step-to pattern;Antalgic;Trunk flexed;Decreased stride length;Decreased stance time - left Gait velocity: very, very slow   General Gait Details: Cues for forward gaze, upper trunk control and RW safety.  Pt flexed over RW when fatigued.   Stairs             Wheelchair Mobility    Modified Rankin (Stroke Patients Only)       Balance Overall balance assessment: Needs assistance Sitting-balance support: Feet supported;Bilateral upper extremity supported Sitting balance-Leahy Scale: Fair       Standing balance-Leahy Scale: Poor                              Cognition Arousal/Alertness: Awake/alert Behavior During Therapy: Anxious;WFL for tasks assessed/performed Overall Cognitive Status: Within Functional Limits for tasks assessed                                 General Comments: remains self limiting due to pain.      Exercises Total Joint Exercises Ankle Circles/Pumps: AROM;Both;10 reps;Supine Quad Sets: AROM;Left;10 reps;Supine Hip ABduction/ADduction: AAROM;10 reps;Left;Supine Straight Leg Raises: AAROM;Left;10 reps;Supine Goniometric ROM: NO ROM due to restrictions set by MD.    General Comments        Pertinent Vitals/Pain Pain Assessment: 0-10 Pain Score: 8  Pain Location: L knee Pain Descriptors / Indicators: Grimacing;Guarding Pain Intervention(s): Monitored during session;Repositioned    Home Living  Prior Function            PT Goals (current goals can now be found in the care plan section) Acute Rehab PT Goals Patient Stated Goal: home Potential to Achieve Goals: Fair Progress towards PT goals: Progressing toward goals    Frequency    7X/week      PT Plan Current plan remains appropriate    Co-evaluation              AM-PAC PT "6 Clicks" Mobility   Outcome Measure  Help needed turning from your back to your side while in a flat  bed without using bedrails?: A Little Help needed moving from lying on your back to sitting on the side of a flat bed without using bedrails?: A Little Help needed moving to and from a bed to a chair (including a wheelchair)?: A Little Help needed standing up from a chair using your arms (e.g., wheelchair or bedside chair)?: A Little Help needed to walk in hospital room?: A Lot Help needed climbing 3-5 steps with a railing? : A Lot 6 Click Score: 16    End of Session Equipment Utilized During Treatment: Left knee immobilizer Activity Tolerance: Patient limited by fatigue;Patient limited by pain Patient left: with call bell/phone within reach;in chair Nurse Communication: Mobility status PT Visit Diagnosis: Unsteadiness on feet (R26.81);Difficulty in walking, not elsewhere classified (R26.2);Pain;Muscle weakness (generalized) (M62.81) Pain - Right/Left: Left Pain - part of body: Knee     Time: 3007-6226 PT Time Calculation (min) (ACUTE ONLY): 32 min  Charges:  $Gait Training: 8-22 mins $Therapeutic Exercise: 8-22 mins                     [name] , PTA Acute Rehabilitation Services Pager [phone] Office [place] 06/20/2020, 12:14 PM

## 2020-06-20 NOTE — Discharge Summary (Addendum)
Patient ID: Tracy Johnston MRN: [id number] DOB/AGE: 08-20-1950 70 y.o.  Admit date: 06/15/2020 Discharge date: 06/28/2020 Admission Diagnoses:  Active Problems:   Unilateral primary osteoarthritis, left knee   Total knee replacement status   Wheezing   RUQ pain   Discharge Diagnoses:  Active Problems:   Unilateral primary osteoarthritis, left knee   Total knee replacement status   Wheezing   RUQ pain  status post Procedure(s): LEFT TOTAL KNEE ARTHROPLASTY  Past Medical History:  Diagnosis Date  . Arthritis   . Asthma   . Atrial fibrillation (HCC)    chronic. Was first diagnosed in her early 57s.  . Cancer ([place]) 2003ish   , cervix  . COPD (chronic obstructive pulmonary disease) ([facility] [facility])   . Dysrhythmia   . Heart murmur   . Hypertension   . Insomnia   . Malignant neoplasm of kidney ([place])    rt removed 2003  . Mitral regurgitation    Mild to moderate. Normal EF 09/2010  . Renal insufficiency   . Sickle cell anemia (HCC)   . Tobacco abuse     Surgeries: Procedure(s): LEFT TOTAL KNEE ARTHROPLASTY on 06/15/2020   Consultants:   Discharged Condition: Improved  Hospital Course: Tracy Johnston is an 70 y.o. female who was admitted 06/15/2020 for operative treatment of left knee djd. Patient failed conservative treatments (please see the history and physical for the specifics) and had severe unremitting pain that affects sleep, daily activities and work/hobbies. After pre-op clearance, the patient was taken to the operating room on 06/15/2020 and underwent  Procedure(s): LEFT TOTAL KNEE ARTHROPLASTY.    Patient was given perioperative antibiotics:  Anti-infectives (From admission, onward)   Start     Dose/Rate Route Frequency Ordered Stop   06/28/20 0000  amoxicillin-clavulanate (AUGMENTIN) 875-125 MG tablet        1 tablet Oral 2 times daily 06/27/20 1243 07/01/20 2359   06/26/20 1200  cefTRIAXone (ROCEPHIN) 2 g in sodium chloride 0.9 % 100 mL IVPB  Status:  Discontinued         2 g 200 mL/hr over 30 Minutes Intravenous Every 24 hours 06/26/20 1101 06/28/20 1855   06/21/20 2000  cefTRIAXone (ROCEPHIN) 2 g in sodium chloride 0.9 % 100 mL IVPB        2 g 200 mL/hr over 30 Minutes Intravenous Every 24 hours 06/21/20 1857 06/25/20 2049   06/21/20 2000  azithromycin (ZITHROMAX) tablet 500 mg        500 mg Oral Daily 06/21/20 1857 06/23/20 0939   06/15/20 0600  ceFAZolin (ANCEF) IVPB 2g/100 mL premix        2 g 200 mL/hr over 30 Minutes Intravenous On call to O.R. 06/15/20 4098 06/15/20 0746       Patient was given sequential compression devices and early ambulation to prevent DVT.   Patient benefited maximally from hospital stay and there were no complications. At the time of discharge, the patient was urinating/moving their bowels without difficulty, tolerating a regular diet, pain is controlled with oral pain medications and they have been cleared by PT/OT.   Recent vital signs:  No data found.   Recent laboratory studies:  No results for input(s): WBC, HGB, HCT, PLT, NA, K, CL, CO2, BUN, CREATININE, GLUCOSE, INR, CALCIUM in the last 72 hours.  Invalid input(s): PT, 2   Discharge Medications:   Allergies as of 06/28/2020      Reactions   Bupropion Hives   Aspirin Other (See Comments)   Bleeding  at higher doses   Latex Rash   When she wears gloves      Medication List    STOP taking these medications   ibuprofen 800 MG tablet Commonly known as: ADVIL     TAKE these medications   allopurinol 300 MG tablet Commonly known as: ZYLOPRIM TAKE 1 TABLET BY MOUTH DAILY.   diltiazem 240 MG 24 hr capsule Commonly known as: CARDIZEM CD Take 1 capsule (240 mg total) by mouth daily.   ipratropium-albuterol 0.5-2.5 (3) MG/3ML Soln Commonly known as: DUONEB Inhale 3 mLs into the lungs every 6 (six) hours as needed for shortness of breath.   montelukast 10 MG tablet Commonly known as: SINGULAIR Take 10 mg by mouth at bedtime as needed for allergies.    Myrbetriq 50 MG Tb24 tablet Generic drug: mirabegron ER Take 50 mg by mouth daily.   olmesartan-hydrochlorothiazide 40-12.5 MG tablet Commonly known as: BENICAR HCT Take 1 tablet by mouth daily.   oxymorphone 10 MG tablet Commonly known as: OPANA Take 1 tablet (10 mg total) by mouth every 8 (eight) hours as needed for pain.   polyethylene glycol 17 g packet Commonly known as: MIRALAX / GLYCOLAX Take 17 g by mouth daily. What changed: when to take this   predniSONE 20 MG tablet Commonly known as: DELTASONE Take 20 mg by mouth daily as needed (short of breath).   ProAir HFA 108 (90 Base) MCG/ACT inhaler Generic drug: albuterol Inhale 2 puffs into the lungs every 4 (four) hours as needed for shortness of breath or wheezing.   rivaroxaban 20 MG Tabs tablet Commonly known as: Xarelto Take 1 tablet (20 mg total) by mouth at bedtime.   simvastatin 20 MG tablet Commonly known as: ZOCOR Take 20 mg by mouth daily.   Trelegy Ellipta 100-62.5-25 MCG/INH Aepb Generic drug: Fluticasone-Umeclidin-Vilant Inhale 1 puff into the lungs daily.     ASK your doctor about these medications   amoxicillin-clavulanate 875-125 MG tablet Commonly known as: Augmentin Take 1 tablet by mouth 2 (two) times daily for 3 days. Ask about: Should I take this medication?       Diagnostic Studies: CT ABDOMEN PELVIS WO CONTRAST  Result Date: 06/22/2020 CLINICAL DATA:  70 year old female with abdominal pain. Concern for hernia. EXAM: CT ABDOMEN AND PELVIS WITHOUT CONTRAST TECHNIQUE: Multidetector CT imaging of the abdomen and pelvis was performed following the standard protocol without IV contrast. COMPARISON:  Abdominal radiograph dated 06/21/2020. CT abdomen pelvis dated 09/13/2012. FINDINGS: Evaluation of this exam is limited in the absence of intravenous contrast as well as due to respiratory motion artifact. Lower chest: Patchy area of ground-glass and streaky density at the right lung base concerning  for pneumonia. Clinical correlation is recommended. There is mild cardiomegaly. No intra-abdominal free air or free fluid. Hepatobiliary: The liver is unremarkable. No intrahepatic biliary ductal dilatation. No calcified gallstone or pericholecystic fluid. Pancreas: Unremarkable. No pancreatic ductal dilatation or surrounding inflammatory changes. Spleen: Normal in size without focal abnormality. Adrenals/Urinary Tract: The adrenal glands are unremarkable. Status post prior right nephrectomy. There is no hydronephrosis or nephrolithiasis on the left. The left ureter and urinary bladder appear unremarkable. Stomach/Bowel: There is sigmoid diverticulosis and diffuse colonic diverticula without active inflammatory changes. There is no bowel obstruction or active inflammation. The appendix is normal. Vascular/Lymphatic: Advanced aortoiliac atherosclerotic disease. The IVC is unremarkable. No portal venous gas. There is no adenopathy. Reproductive: The uterus is anteverted. There is a 2 cm calcified fibroid. No adnexal masses. Other: There is  a broad-based right lateral hernia containing segment of the colon. Musculoskeletal: Osteopenia with degenerative changes of the spine. The disc a shin and vacuum phenomena at L4-L5. No acute osseous pathology. Bilateral hip arthroplasties. IMPRESSION: 1. No acute intra-abdominal or pelvic pathology. 2. Colonic diverticulosis. No bowel obstruction. Normal appendix. 3. Broad-based right lateral hernia containing a segment of the colon. 4. Status post prior right nephrectomy. 5. Aortic Atherosclerosis (ICD10-I70.0). Electronically Signed   By: Anner Crete M.D.   On: 06/22/2020 23:34   DG Chest 2 View  Result Date: 06/21/2020 CLINICAL DATA:  Wheezing. EXAM: CHEST - 2 VIEW COMPARISON:  Jun 13, 2020. FINDINGS: Stable cardiomegaly. No pneumothorax is noted. Stable bibasilar interstitial opacities are noted concerning for scarring, although acute superimposed edema or inflammation  cannot be excluded. Bony thorax is unremarkable. IMPRESSION: Stable bibasilar interstitial opacities are noted concerning for scarring, although superimposed acute edema or inflammation cannot be excluded. Electronically Signed   By: Marijo Conception M.D.   On: 06/21/2020 15:18   DG Chest 2 View  Result Date: 06/15/2020 CLINICAL DATA:  70 year old female preoperative exam. Left knee surgery. Former smoker. EXAM: CHEST - 2 VIEW COMPARISON:  CT Abdomen and Pelvis 09/13/2012. Chest radiographs 12/29/2011. FINDINGS: Moderate to severe cardiomegaly appears progressed since 2014. Other mediastinal contours are within normal limits. Visualized tracheal air column is within normal limits. Chronically large lung volumes. Chronic increased interstitial opacity in both lungs has only mildly progressed since 2013. no pneumothorax, pleural effusion or confluent pulmonary opacity. Previous left humerus ORIF. No acute osseous abnormality identified. Stable cholecystectomy clips. Negative visible bowel gas pattern. IMPRESSION: 1. Moderate to severe cardiomegaly has progressed since 2014. 2. Probable smoking related chronic pulmonary hyperinflation and increased interstitial markings. 3. No superimposed acute cardiopulmonary abnormality. Electronically Signed   By: Genevie Ann M.D.   On: 06/15/2020 06:53   DG Abd 1 View  Result Date: 06/21/2020 CLINICAL DATA:  Constipation EXAM: ABDOMEN - 1 VIEW COMPARISON:  02/08/2018 FINDINGS: Streaky left lung base opacity. Surgical clips in the right upper quadrant. Nonobstructed bowel-gas pattern with mild stool. Calcified fibroid in the pelvis IMPRESSION: Negative. Electronically Signed   By: Donavan Foil M.D.   On: 06/21/2020 18:40   NM Pulmonary Perf and Vent  Result Date: 06/22/2020 CLINICAL DATA:  Chest pain, shortness of breath EXAM: NUCLEAR MEDICINE PERFUSION LUNG SCAN TECHNIQUE: Perfusion images were obtained in multiple projections after intravenous injection of  radiopharmaceutical. Ventilation scans intentionally deferred if perfusion scan and chest x-ray adequate for interpretation during COVID 19 epidemic. RADIOPHARMACEUTICALS:  4.2 mCi Tc-69m MAA IV COMPARISON:  Chest x-ray 06/21/2020 FINDINGS: Slightly heterogeneous accumulation of radiotracer throughout the bilateral lung fields. No segmental perfusion defects. Prominent area of central photopenia compatible with cardiomegaly. IMPRESSION: Low probability of pulmonary embolism. Electronically Signed   By: Davina Poke D.O.   On: 06/22/2020 15:07   VAS [place] LOWER EXTREMITY VENOUS (DVT)  Result Date: 06/20/2020  Lower Venous DVT Study Patient Name:  Tracy Johnston  Date of Exam:   06/20/2020 Medical Rec #: [id number]     Accession #:    [id number] Date of Birth: 01-03-1951     Patient Gender: F Patient Age:   69Y Exam Location:  [facility] Procedure:      VAS [place] LOWER EXTREMITY VENOUS (DVT) Referring Phys: [id number] Alyson Locket Tylan Kinn --------------------------------------------------------------------------------  Indications: Erythema and calf swelling s/p LT arthroplasty 06/15/20.  Limitations: Poor ultrasound/tissue interface and patient pain tolerance. Comparison Study: No prior studies. Performing Technologist:  Darlin Coco RDMS,RVT  Examination Guidelines: A complete evaluation includes B-mode imaging, spectral Doppler, color Doppler, and power Doppler as needed of all accessible portions of each vessel. Bilateral testing is considered an integral part of a complete examination. Limited examinations for reoccurring indications may be performed as noted. The reflux portion of the exam is performed with the patient in reverse Trendelenburg.  +-----+---------------+---------+-----------+----------+--------------+ RIGHTCompressibilityPhasicitySpontaneityPropertiesThrombus Aging +-----+---------------+---------+-----------+----------+--------------+ CFV  Full           Yes      Yes                                  +-----+---------------+---------+-----------+----------+--------------+   +---------+---------------+---------+-----------+----------+--------------+ LEFT     CompressibilityPhasicitySpontaneityPropertiesThrombus Aging +---------+---------------+---------+-----------+----------+--------------+ CFV      Full           Yes      Yes                                 +---------+---------------+---------+-----------+----------+--------------+ SFJ      Full                                                        +---------+---------------+---------+-----------+----------+--------------+ FV Prox  Full                                                        +---------+---------------+---------+-----------+----------+--------------+ FV Mid   Full                                                        +---------+---------------+---------+-----------+----------+--------------+ FV Distal               Yes      Yes                                 +---------+---------------+---------+-----------+----------+--------------+ PFV      Full                                                        +---------+---------------+---------+-----------+----------+--------------+ POP      Full           Yes      Yes                                 +---------+---------------+---------+-----------+----------+--------------+ PTV      Full                                                        +---------+---------------+---------+-----------+----------+--------------+  PERO     Full                                                        +---------+---------------+---------+-----------+----------+--------------+     Summary: RIGHT: - No evidence of common femoral vein obstruction.  LEFT: - There is no evidence of deep vein thrombosis in the lower extremity. However, portions of this examination were limited- see technologist comments above.  - No cystic structure found in the  popliteal fossa.  *See table(s) above for measurements and observations. Electronically signed by Harold Barban MD on 06/20/2020 at 9:21:39 PM.    Final    US Abdomen Limited RUQ (LIVER/GB)  Result Date: 06/24/2020 CLINICAL DATA:  RIGHT upper quadrant pain EXAM: ULTRASOUND ABDOMEN LIMITED RIGHT UPPER QUADRANT COMPARISON:  06/21/2020 FINDINGS: Gallbladder: There is biliary sludge. Gallbladder wall thickness is normal. No pericholecystic fluid. No sonographic Murphy sign noted by sonographer. Common bile duct: Diameter: 6 mm, normal Liver: No focal lesion identified. Within normal limits in parenchymal echogenicity. Portal vein is patent on color Doppler imaging with normal direction of blood flow towards the liver. Other: Status post RIGHT nephrectomy. IMPRESSION: No sonographic etiology for RIGHT upper quadrant pain identified. There is biliary sludge without evidence of acute cholecystitis. Electronically Signed   By: Valentino Saxon MD   On: 06/24/2020 17:48       Contact information for follow-up providers    [facility] [place] Follow up.   Specialty: [place] Why: the office to call to schedule home health physical therapy visits Contact information: [place] [address] [phone]        [name], MD Follow up.   Specialty: Orthopedic Surgery Contact information: [place] [address] [place], [place] [facility] Follow up.   Why: [employer] providing DME. Follow up with the agency with any questions or concerns about your medical equipment. Contact information: [address] [place] [address] [phone]            Contact information for after-discharge care    Destination    [facility] .   Service: Skilled Nursing Contact information: [address] [place] [phone]                   Discharge Plan:  discharge to SNF  Disposition:     Signed: Benjiman Core for Rodell Perna MD 07/02/2020, 2:02 PM

## 2020-06-20 NOTE — TOC Progression Note (Addendum)
Transition of Care [facility]) - Progression Note    Patient Details  Name: Feliza Diven MRN: [id number] Date of Birth: Oct 19, 1950  [facility]) CM/SW Contact  Milinda Antis, [place] Phone Number: 06/20/2020, 12:34 PM  Clinical Narrative:     CSW spoke with Mardene Celeste at [facility].  The patient has been accepted and can go to the facility tomorrow pending insurance auth. and a negative COVID screening.    13:14:  CSW received a call from [place] with [facility] stated that the patient informed the facility that she would rather go home because she would have another doctor following her in the facility.  CSW met with the patient and discussed the process.  The patient stated that she is ambulating better with PT and hopes to go home.  CSW informed the patient that the facility is still willing to accept and can take her tomorrow.  The patient stated that she would think about home with hh v/s SNF tonight and speak with her son.   Expected Discharge Plan: Skilled Nursing Facility Barriers to Discharge: Insurance Authorization,SNF Pending bed offer  Expected Discharge Plan and Services Expected Discharge Plan: [place]   Discharge Planning Services: CM Consult Post Acute Care Choice: [place] arrangements for the past 2 months: Single Family Home                                       Social Determinants of Health (SDOH) Interventions    Readmission Risk Interventions No flowsheet data found.

## 2020-06-20 NOTE — Plan of Care (Signed)

## 2020-06-20 NOTE — Progress Notes (Signed)
SNF auth initiated in [place] portal.

## 2020-06-20 NOTE — TOC Progression Note (Signed)
Transition of Care [facility]) - Progression Note    Patient Details  Name: Ferrin Liebig MRN: [id number] Date of Birth: 01-22-1951  Transition of Care [facility]) CM/SW [place], [place] Phone Number: 06/20/2020, 9:42 AM  Clinical Narrative:     CSW called Mardene Celeste at [facility]; she will review pt. CSW provided CSW Reandra's contact info for follow up.   Expected Discharge Plan: Skilled Nursing Facility Barriers to Discharge: Insurance Authorization,SNF Pending bed offer  Expected Discharge Plan and Services Expected Discharge Plan: [place]   Discharge Planning Services: CM Consult Post Acute Care Choice: [place] arrangements for the past 2 months: Single Family Home                                       Social Determinants of Health (SDOH) Interventions    Readmission Risk Interventions No flowsheet data found.

## 2020-06-20 NOTE — Plan of Care (Signed)

## 2020-06-21 ENCOUNTER — Inpatient Hospital Stay (HOSPITAL_COMMUNITY): Payer: Medicare HMO

## 2020-06-21 DIAGNOSIS — J189 Pneumonia, unspecified organism: Secondary | ICD-10-CM

## 2020-06-21 LAB — RETICULOCYTES
Immature Retic Fract: 15.8 % (ref 2.3–15.9)
RBC.: 4.27 MIL/uL (ref 3.87–5.11)
Retic Count, Absolute: 76 10*3/uL (ref 19.0–186.0)
Retic Ct Pct: 1.8 % (ref 0.4–3.1)

## 2020-06-21 LAB — CBC WITH DIFFERENTIAL/PLATELET
Abs Immature Granulocytes: 0.12 10*3/uL — ABNORMAL HIGH (ref 0.00–0.07)
Basophils Absolute: 0 10*3/uL (ref 0.0–0.1)
Basophils Relative: 0 %
Eosinophils Absolute: 0 10*3/uL (ref 0.0–0.5)
Eosinophils Relative: 0 %
HCT: 36.1 % (ref 36.0–46.0)
Hemoglobin: 12.7 g/dL (ref 12.0–15.0)
Immature Granulocytes: 1 %
Lymphocytes Relative: 3 %
Lymphs Abs: 0.5 10*3/uL — ABNORMAL LOW (ref 0.7–4.0)
MCH: 31.1 pg (ref 26.0–34.0)
MCHC: 35.2 g/dL (ref 30.0–36.0)
MCV: 88.5 fL (ref 80.0–100.0)
Monocytes Absolute: 1 10*3/uL (ref 0.1–1.0)
Monocytes Relative: 5 %
Neutro Abs: 16.3 10*3/uL — ABNORMAL HIGH (ref 1.7–7.7)
Neutrophils Relative %: 91 %
Platelets: 257 10*3/uL (ref 150–400)
RBC: 4.08 MIL/uL (ref 3.87–5.11)
RDW: 13.6 % (ref 11.5–15.5)
WBC: 17.9 10*3/uL — ABNORMAL HIGH (ref 4.0–10.5)
nRBC: 0 % (ref 0.0–0.2)

## 2020-06-21 LAB — TROPONIN I (HIGH SENSITIVITY)
Troponin I (High Sensitivity): 5 ng/L (ref <18)
Troponin I (High Sensitivity): 6 ng/L (ref <18)

## 2020-06-21 LAB — PROCALCITONIN: Procalcitonin: <0.1 ng/mL

## 2020-06-21 LAB — D-DIMER, QUANTITATIVE: D-Dimer, Quant: 3.02 ug/mL-FEU — ABNORMAL HIGH (ref 0.00–0.50)

## 2020-06-21 IMAGING — DX DG ABDOMEN 1V
1 series · 1 of 1 positions shown · non-contrast
Comparison: [DATE]

CLINICAL DATA: Constipation

EXAM:
ABDOMEN - 1 VIEW

[abdomen supine]
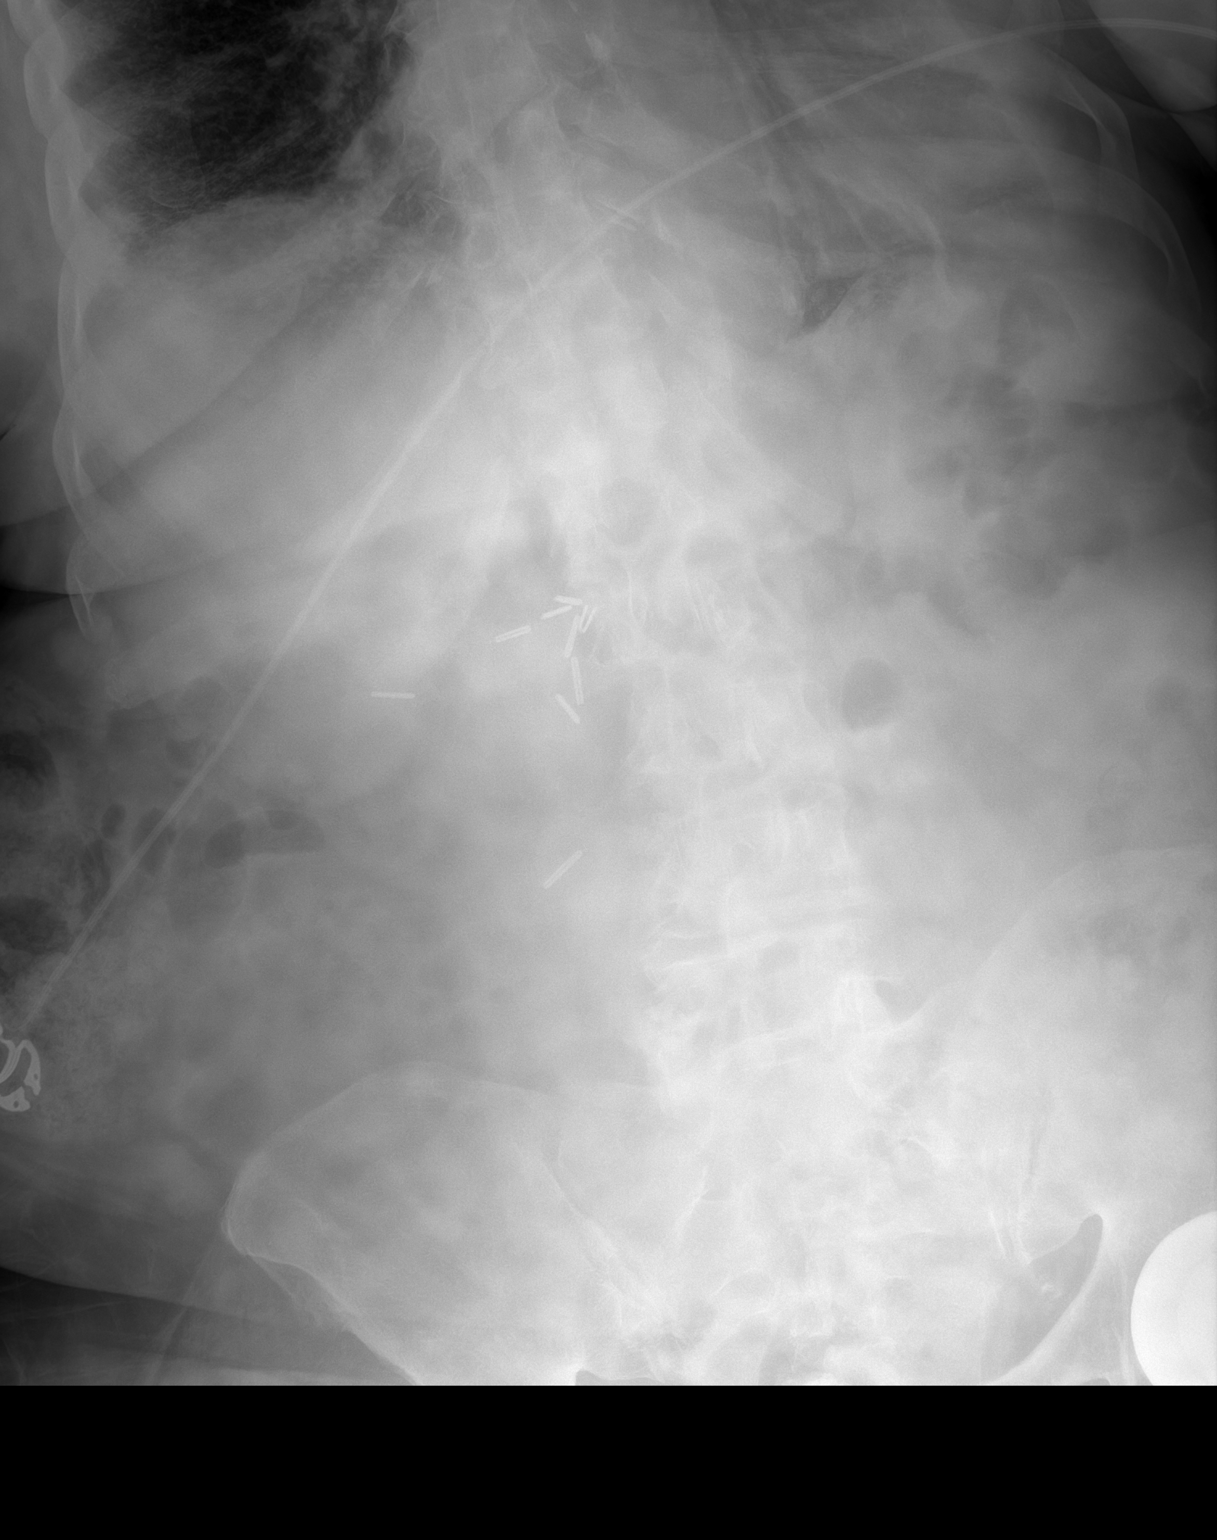

[1 of 1 positions shown; findings below may reference images not displayed]

FINDINGS: Streaky left lung base opacity. Surgical clips in the right upper
quadrant. Nonobstructed bowel-gas pattern with mild stool. Calcified
fibroid in the pelvis
IMPRESSION: Negative.

## 2020-06-21 MED ORDER — RIVAROXABAN 20 MG PO TABS
20.0000 mg | ORAL_TABLET | Freq: Every day | ORAL | Status: DC
  Administered 2020-06-21 – 2020-06-27 (×7): 20 mg via ORAL
  Filled 2020-06-21 (×7): qty 1

## 2020-06-21 MED ORDER — SODIUM CHLORIDE 0.9 % IV SOLN
2.0000 g | INTRAVENOUS | Status: AC
  Administered 2020-06-21 – 2020-06-25 (×5): 2 g via INTRAVENOUS
  Filled 2020-06-21 (×5): qty 20

## 2020-06-21 MED ORDER — LORAZEPAM 2 MG/ML IJ SOLN
INTRAMUSCULAR | Status: AC
  Filled 2020-06-21: qty 1

## 2020-06-21 MED ORDER — LORAZEPAM 1 MG PO TABS
1.0000 mg | ORAL_TABLET | Freq: Four times a day (QID) | ORAL | Status: DC | PRN
  Administered 2020-06-24: 1 mg via ORAL
  Filled 2020-06-21: qty 1

## 2020-06-21 MED ORDER — LORAZEPAM 2 MG/ML IJ SOLN
0.5000 mg | Freq: Once | INTRAMUSCULAR | Status: AC
  Administered 2020-06-21: 0.5 mg via INTRAVENOUS

## 2020-06-21 MED ORDER — METOPROLOL TARTRATE 5 MG/5ML IV SOLN: 2.5000 mg | Freq: Four times a day (QID) | INTRAVENOUS | Status: DC | PRN

## 2020-06-21 MED ORDER — IOHEXOL 9 MG/ML PO SOLN
ORAL | Status: AC
  Administered 2020-06-21: 1000 mL
  Filled 2020-06-21: qty 1000

## 2020-06-21 MED ORDER — AZITHROMYCIN 500 MG PO TABS
500.0000 mg | ORAL_TABLET | Freq: Every day | ORAL | Status: AC
  Administered 2020-06-21 – 2020-06-23 (×3): 500 mg via ORAL
  Filled 2020-06-21 (×3): qty 1

## 2020-06-21 MED ORDER — DILTIAZEM HCL ER COATED BEADS 180 MG PO CP24
300.0000 mg | ORAL_CAPSULE | Freq: Every day | ORAL | Status: DC
  Administered 2020-06-22 – 2020-06-28 (×7): 300 mg via ORAL
  Filled 2020-06-21 (×7): qty 1

## 2020-06-21 NOTE — Progress Notes (Signed)
[name]: [phone].  Approved: 06/20/20 - 06/22/20, with next review date on 06/22/20.

## 2020-06-21 NOTE — Plan of Care (Signed)
  Problem: Clinical Measurements: Goal: Will remain free from infection Outcome: Progressing   Problem: Activity: Goal: Risk for activity intolerance will decrease Outcome: Progressing   

## 2020-06-21 NOTE — Progress Notes (Signed)
Pt alert/oriented x4 c/o wheezing and SOB, prn meds given with good effect, and was noted holding to her right upper chest of pain ,prn meds given, v/s checked,O2 sats decreased on room air,PA has been informed via secured chat. Still awaiting a response.Marland Kitchen

## 2020-06-21 NOTE — Progress Notes (Signed)
Subjective: Patient doing well this morning.  Left knee pain controlled.  She is ready for discharge to skilled nurse facility.  Venous Doppler study left lower extremity performed yesterday was negative for DVT.   Objective: Vital signs in last 24 hours: Temp:  [97.6 F (36.4 C)-97.9 F (36.6 C)] 97.6 F (36.4 C) (05/26 0803) Pulse Rate:  [73-86] 73 (05/26 0803) Resp:  [17-18] 17 (05/26 0803) BP: (98-122)/(61-67) 118/64 (05/26 0803) SpO2:  [90 %-97 %] 90 % (05/26 0803)  Intake/Output from previous day: 05/25 0701 - 05/26 0700 In: 480 [P.O.:480] Out: 500 [Urine:500] Intake/Output this shift: No intake/output data recorded.  No results for input(s): HGB in the last 72 hours. No results for input(s): WBC, RBC, HCT, PLT in the last 72 hours. No results for input(s): NA, K, CL, CO2, BUN, CREATININE, GLUCOSE, CALCIUM in the last 72 hours. No results for input(s): LABPT, INR in the last 72 hours.   Exam Pleasant female alert and oriented in no acute distress.  Left knee will looks good.  No drainage or signs of infection.    Assessment/Plan: Transfer to skilled nursing facility when bed is available. All paperwork completed.  Will need return office visit with Dr. Lorin Mercy 2 weeks postop.  Scripts on chart.  Benjiman Core 06/21/2020, 9:55 AM

## 2020-06-21 NOTE — Progress Notes (Signed)
Physical Therapy Treatment Patient Details Name: Tracy Johnston MRN: [id number] DOB: 1950-12-04 Today's Date: 06/21/2020    History of Present Illness Pt is a 70 y/o female s/p L TKA. PMH includes COPD, cervical cancer, a fib, HTN, tobbacco use, and bilateral THA.    PT Comments    Pt seated in recliner clutching R side of chest, called charge RN to room to assess patient.  She reports the pain started this am and she presents with [facility].  HR elevated and SPO2 in low 90s.  Pt belching frequently and RN gave consent to attempt gt in efforts to alleviate possible gas pain.  Pain remained and d/c treatment after 8 ft of gt training. Will f/u per POC.     Follow Up Recommendations  Follow surgeon's recommendation for DC plan and follow-up therapies;Supervision/Assistance - 24 hour     Equipment Recommendations  Rolling walker with 5" wheels    Recommendations for Other Services       Precautions / Restrictions Precautions Precautions: Fall;Knee Precaution Booklet Issued: No Precaution Comments: Verbally reviewed knee precautions with pt. Required Braces or Orthoses: Knee Immobilizer - Left Knee Immobilizer - Right: On when out of bed or walking Knee Immobilizer - Left: On when out of bed or walking Restrictions Weight Bearing Restrictions: Yes LLE Weight Bearing: Weight bearing as tolerated Other Position/Activity Restrictions: NO knee ROM, KI to be worn during ambulation.    Mobility  Bed Mobility               General bed mobility comments: Pt seated in recliner with complaints of R sided chest pain and difficulty breathing.  Called charge nurse to room to assess patient.  After assessment decided to proceed with short bout of gt training to see if pressure was alleviated by movement ( in case it was gas pain ).    Transfers Overall transfer level: Needs assistance Equipment used: Rolling walker (2 wheeled) Transfers: Sit to/from Stand Sit to Stand: Min guard          General transfer comment: Cues for hand placement to and from seated surface.  Ambulation/Gait Ambulation/Gait assistance: Min guard;+2 safety/equipment Gait Distance (Feet): 8 Feet Assistive device: Rolling walker (2 wheeled) Gait Pattern/deviations: Step-to pattern;Antalgic;Trunk flexed;Decreased stride length;Decreased stance time - left Gait velocity: very, very slow   General Gait Details: Cues for forward gaze, upper trunk control and RW safety.  Pt unable to progress gt distance due to pain  in R side of chest and DOE.  SPO2 89%-94%, HR 94-139 bpm. After 8 ft required sitting in recliner.   Stairs             Wheelchair Mobility    Modified Rankin (Stroke Patients Only)       Balance Overall balance assessment: Needs assistance Sitting-balance support: Feet supported;Bilateral upper extremity supported Sitting balance-Leahy Scale: Fair       Standing balance-Leahy Scale: Poor                              Cognition Arousal/Alertness: Awake/alert Behavior During Therapy: Anxious;WFL for tasks assessed/performed Overall Cognitive Status: Within Functional Limits for tasks assessed                                 General Comments: remains self limiting due to pain.      Exercises      General  Comments        Pertinent Vitals/Pain Pain Assessment: 0-10 Pain Score: 6  Pain Location: L knee and R side of chest under R breast ( pulling and tight) Pain Descriptors / Indicators: Grimacing;Guarding;Tightness Pain Intervention(s): Monitored during session;Repositioned    Home Living                      Prior Function            PT Goals (current goals can now be found in the care plan section) Acute Rehab PT Goals Potential to Achieve Goals: Fair Progress towards PT goals: Progressing toward goals    Frequency    7X/week      PT Plan Current plan remains appropriate    Co-evaluation               AM-PAC PT "6 Clicks" Mobility   Outcome Measure  Help needed turning from your back to your side while in a flat bed without using bedrails?: A Little Help needed moving from lying on your back to sitting on the side of a flat bed without using bedrails?: A Little Help needed moving to and from a bed to a chair (including a wheelchair)?: A Little Help needed standing up from a chair using your arms (e.g., wheelchair or bedside chair)?: A Little Help needed to walk in hospital room?: A Little Help needed climbing 3-5 steps with a railing? : A Little 6 Click Score: 18    End of Session Equipment Utilized During Treatment: Gait belt;Left knee immobilizer Activity Tolerance: Patient limited by fatigue;Patient limited by pain Patient left: with call bell/phone within reach;in chair Nurse Communication: Mobility status PT Visit Diagnosis: Unsteadiness on feet (R26.81);Difficulty in walking, not elsewhere classified (R26.2);Pain;Muscle weakness (generalized) (M62.81) Pain - Right/Left: Left Pain - part of body: Knee     Time: 1132-1201 PT Time Calculation (min) (ACUTE ONLY): 29 min  Charges:  $Gait Training: 8-22 mins $Therapeutic Activity: 8-22 mins                     [name] , PTA Acute Rehabilitation Services Pager [phone] Office [phone]     [place]ill [name] 06/21/2020, 3:06 PM

## 2020-06-21 NOTE — Progress Notes (Signed)
PT Cancellation Note  Patient Details Name: Tracy Johnston MRN: [id number] DOB: Jan 26, 1951   Cancelled Treatment:    Reason Eval/Treat Not Completed: (P) Medical issues which prohibited therapy (Pt receiving breathing tx, will return when patient is available.)   [name] 06/21/2020, 10:51 AM  [name] , PTA Acute Rehabilitation Services Pager [phone] Office [phone]

## 2020-06-21 NOTE — TOC Progression Note (Signed)
Transition of Care [facility]) - Progression Note    Patient Details  Name: Tracy Johnston MRN: [id number] Date of Birth: 03-May-1950  Transition of Care [facility]) CM/SW Contact  Milinda Antis, Story Phone Number: 06/21/2020, 11:56 AM  Clinical Narrative:    CSW met with patient and patient is agreeable to SNF.  The intake process with the SNF has been completed.  The patient has a negative COVID screening, insurance [name] has been approved, and the facility can accept the patient today.  Pending D/C order and summary.   Expected Discharge Plan: Skilled Nursing Facility Barriers to Discharge: Insurance Authorization,SNF Pending bed offer  Expected Discharge Plan and Services Expected Discharge Plan: [place]   Discharge Planning Services: CM Consult Post Acute Care Choice: [place] arrangements for the past 2 months: Single Family Home                                       Social Determinants of Health (SDOH) Interventions    Readmission Risk Interventions No flowsheet data found.

## 2020-06-21 NOTE — Care Management Important Message (Signed)
Important Message  Patient Details  Name: Tracy Johnston MRN: [id number] Date of Birth: October 10, 1950   Medicare Important Message Given:  Yes     Barb Merino Corrissa Martello 06/21/2020, 11:55 AM

## 2020-06-21 NOTE — Significant Event (Signed)
Rapid Response Event Note   Initial Focused Assessment:  Pt AO. Skin is warm, diaphoresis noted to pt's forehead. Heart rate is irregular with a rate of 100-105 bpm. Lung sounds are clear, bases are diminished. Abdomen is round, distended. Bowel sounds are faint. Pt states her last BM was on Saturday (5/21). Pt endorses pain under her right breast.   VS: T 98, BP 134/84, HR 98, RR 22, SpO2 96% on 2LNC  Interventions:  No intervention from [place] of Care:  -New orders for 2V CXR and EKG received from Madelyn Brunner, PA -Recommend continuous telemetry monitoring and KUB -Increase frequency of vital signs  Event Summary:  MD Notified: Madelyn Brunner, PA Call Time: [place] Time: 0102 End Time: [place], RN

## 2020-06-21 NOTE — Consult Note (Signed)
[facility] Medical Consultation  Tracy Johnston WUJ:[id number] DOB: March 19, 1950 DOA: 06/15/2020 PCP: Leeanne [place], MD   Requesting physician: Benjiman Core Date of consultation: 06/21/2020 Reason for consultation: Chest pain, shortness of breath  Impression: Atrial fibrillation with rapid ventricular response Shortness of breath likely secondary to community-acquired pneumonia, rule out pulmonary embolism Right-sided chest pain likely secondary to anxiety versus abdominal hernia   Recommendations: Rapid response was called earlier during the day and the patient was noted to be in an irregular rhythm with a rate of 100-105 beats per minute.  EKG was obtained and showed atrial fibrillation with RVR, rate 107.  The patient is on Cardizem 240 mg daily.  We will increase this to 300 mg daily.  I have ordered Lopressor IV 2.5 mg every 6 hours as needed for heart rate greater than 130.  She also endorses some shortness of breath.  Not hypoxic.  Review of her plain film imaging shows possible pneumonia.  We will start antibiotics with ceftriaxone and azithromycin.  D-dimer will be obtained.  VQ scan will be obtained.  Unable to obtain a CT PE study due to contrast shortage currently.  Low suspicion for PE.  The patient is on chronic anticoagulation with Xarelto for her A. fib and for stroke prophylaxis.  Duplex ultrasound of bilateral lower extremities were obtained and was negative for DVT.  She endorses right chest pain but when she points to the area she points to the right lateral abdominal area.  She has a large hernia there.  It is reducible upon my exam.  We will obtain abdomen pelvis without contrast to further evaluate this.  She has a rising white count which could be attributed to the pneumonia.  We will obtain a procalcitonin.  Furthermore we will obtain a urinalysis.  Troponins have been obtained and they are noted to be 5 and 6.  KUB was obtained and showed no significant constipation.   No bowel obstruction was noted.  I have ordered Ativan as needed for any anxiety. Further plan of care on expanding database.   The hospitalist team will followup again tomorrow. Please contact me if I can be of assistance in the meanwhile. Thank you for this consultation.  Chief Complaint: Shortness of breath, chest pain  HPI:  70 year old African-American female with a past medical history of hypertension, COPD not on home oxygen supplementation, atrial fibrillation, asthma, amongst other chronic comorbidities.  The patient was hospitalized after undergoing a left total knee replacement.  She was going to be discharged to skilled nursing facility for rehab but then started developing right-sided chest pain and shortness of breath prior to discharge.  Discharge was put on hold.  Rapid response was called.  She was noted to be in A. fib with mild RVR with a heart rate of 107.  Noted to be short of breath.  Not hypoxic.  Upon my evaluation with nursing staff at bedside she points to her right lateral abdominal wall where she has a hernia.  She states that she feels very anxious.  When I discussed doing further work-up she got very upset and started crying.  States that she wanted to go home.  She states that she has been here for so many days and she cannot wait to get out of the hospital.  She denies any cough.  Denies any fevers or chills.  She stated that she wanted to be left alone.  Review of Systems:  10 point review of systems is negative,  except for what is mentioned above in the HPI.  Past Medical History:  Diagnosis Date  . Arthritis   . Asthma   . Atrial fibrillation (HCC)    chronic. Was first diagnosed in her early 17s.  . Cancer ([place]) 2003ish   , cervix  . COPD (chronic obstructive pulmonary disease) ([place])   . Dysrhythmia   . Heart murmur   . Hypertension   . Insomnia   . Malignant neoplasm of kidney ([place])    rt removed 2003  . Mitral regurgitation    Mild to moderate.  Normal EF 09/2010  . Renal insufficiency   . Sickle cell anemia (HCC)   . Tobacco abuse    Past Surgical History:  Procedure Laterality Date  . ABDOMINAL HYSTERECTOMY    . arm fracture     rods, pins and bone graft.- to left arm from hip  . BONE GRAFT HIP ILIAC CREST     to left arm  . CERVICAL CONIZATION W/BX    . NEPHRECTOMY  2003  . TONSILLECTOMY    . TOTAL HIP ARTHROPLASTY  10/17/2011   Procedure: TOTAL HIP ARTHROPLASTY;  Surgeon: [name], MD;  Location: [place];  Service: Orthopedics;  Laterality: Left;  Left Total Hip Arthroplasty  . TOTAL HIP ARTHROPLASTY  01/02/2012   Procedure: TOTAL HIP ARTHROPLASTY;  Surgeon: [name], MD;  Location: [place];  Service: Orthopedics;  Laterality: Right;  Right Total Hip Arthroplasty  . TOTAL KNEE ARTHROPLASTY Left 06/15/2020   Procedure: LEFT TOTAL KNEE ARTHROPLASTY;  Surgeon: [name], MD;  Location: [place];  Service: Orthopedics;  Laterality: Left;  Needs RNFA  . TUBAL LIGATION     Social History:  reports that she quit smoking about 2 years ago. Her smoking use included cigarettes. She started smoking about 36 years ago. She has a 40.00 pack-year smoking history. She has never used smokeless tobacco. She reports current alcohol use. She reports that she does not use drugs.  Allergies  Allergen Reactions  . Bupropion Hives  . Aspirin Other (See Comments)    Bleeding at higher doses  . Latex Rash    When she wears gloves   Family History  Problem Relation Age of Onset  . Diabetes Mother   . Cancer Mother        ovarian    Prior to Admission medications   Medication Sig Start Date End Date Taking? Authorizing Provider  allopurinol (ZYLOPRIM) 300 MG tablet TAKE 1 TABLET BY MOUTH DAILY. Patient taking differently: Take 300 mg by mouth daily. 09/10/16  Yes [name], MD  diltiazem (CARDIZEM CD) 240 MG 24 hr capsule Take 1 capsule (240 mg total) by mouth daily. 11/25/11  Yes Serpe, Burna Forts, PA-C   Fluticasone-Umeclidin-Vilant (TRELEGY ELLIPTA) 100-62.5-25 MCG/INH AEPB Inhale 1 puff into the lungs daily.   Yes [provider]  ibuprofen (ADVIL) 800 MG tablet TAKE 1 TABLET BY MOUTH TWICE DAILY AS NEEDED WITH FOOD Patient taking differently: Take 800 mg by mouth 2 (two) times daily as needed for moderate pain. 10/15/18  Yes [name], MD  montelukast (SINGULAIR) 10 MG tablet Take 10 mg by mouth at bedtime as needed for allergies. 03/14/20  Yes [provider]  MYRBETRIQ 50 MG TB24 tablet Take 50 mg by mouth daily. 09/22/14  Yes [provider]  olmesartan-hydrochlorothiazide (BENICAR HCT) 40-12.5 MG per tablet Take 1 tablet by mouth daily.     Yes [provider]  polyethylene  glycol (MIRALAX / GLYCOLAX) 17 g packet Take 17 g by mouth every other day.   Yes [provider]  predniSONE (DELTASONE) 20 MG tablet Take 20 mg by mouth daily as needed (short of breath). 10/07/16  Yes [provider]  simvastatin (ZOCOR) 20 MG tablet Take 20 mg by mouth daily.   Yes [provider]  ipratropium-albuterol (DUONEB) 0.5-2.5 (3) MG/3ML SOLN Inhale 3 mLs into the lungs every 6 (six) hours as needed for shortness of breath. 02/15/20   [provider]  oxymorphone (OPANA) 10 MG tablet Take 1 tablet (10 mg total) by mouth every 8 (eight) hours as needed for pain. 06/20/20   Lanae Crumbly, PA-C  PROAIR HFA 108 [phone] Base) MCG/ACT inhaler Inhale 2 puffs into the lungs every 4 (four) hours as needed for shortness of breath or wheezing. 11/19/15   [provider]  rivaroxaban (XARELTO) 20 MG TABS tablet Take 1 tablet (20 mg total) by mouth at bedtime. 06/20/20   Lanae Crumbly, PA-C   Physical Exam: Blood pressure 136/74, pulse 81, temperature 97.6 F (36.4 C), temperature source Oral, resp. rate (!) 22, height 5\' 4"  (1.626 m), weight 93 kg, SpO2 95 %. Vitals:   06/21/20 1445 06/21/20 1542  BP: 134/84 136/74  Pulse:  81  Resp: (!) 22    Temp: 98 F (36.7 C) 97.6 F (36.4 C)  SpO2: 96% 95%    . General:  Appears calm and comfortable and is in NAD . Cardiovascular:  RRR, no m/r/g.  . Respiratory:   CTA bilaterally with no wheezes/rales/rhonchi.  Normal respiratory effort. . Abdomen:  soft, NT, ND, NABS . Skin:  no rash or induration seen on limited exam . Musculoskeletal:  grossly normal tone BUE/BLE, good ROM, no bony abnormality . Lower extremity:  No LE edema.  Limited foot exam with no ulcerations.  2+ distal pulses. Marland Kitchen Psychiatric:  grossly normal mood and affect, speech fluent and appropriate, AOx3 . Neurologic:  CN 2-12 grossly intact, moves all extremities in coordinated fashion, sensation intact  Labs on Admission:  Basic Metabolic Panel: No results for input(s): NA, K, CL, CO2, GLUCOSE, BUN, CREATININE, CALCIUM, MG, PHOS in the last 168 hours. Liver Function Tests: No results for input(s): AST, ALT, ALKPHOS, BILITOT, PROT, ALBUMIN in the last 168 hours. No results for input(s): LIPASE, AMYLASE in the last 168 hours. No results for input(s): AMMONIA in the last 168 hours. CBC: Recent Labs  Lab 06/16/20 0430 06/17/20 0233 06/18/20 0254 06/21/20 1629  WBC 13.8* 16.3* 16.0* 17.9*  NEUTROABS  --   --   --  16.3*  HGB 14.3 14.0 13.7 12.7  HCT 40.3 40.3 38.5 36.1  MCV 89.6 89.4 88.5 88.5  PLT 193 176 169 257   Cardiac Enzymes: No results for input(s): CKTOTAL, CKMB, CKMBINDEX, TROPONINI in the last 168 hours. BNP: Invalid input(s): POCBNP CBG: No results for input(s): GLUCAP in the last 168 hours.  Radiological Exams on Admission: DG Chest 2 View  Result Date: 06/21/2020 CLINICAL DATA:  Wheezing. EXAM: CHEST - 2 VIEW COMPARISON:  Jun 13, 2020. FINDINGS: Stable cardiomegaly. No pneumothorax is noted. Stable bibasilar interstitial opacities are noted concerning for scarring, although acute superimposed edema or inflammation cannot be excluded. Bony thorax is unremarkable. IMPRESSION: Stable bibasilar  interstitial opacities are noted concerning for scarring, although superimposed acute edema or inflammation cannot be excluded. Electronically Signed   By: Marijo Conception M.D.   On: 06/21/2020 15:18   DG  Abd 1 View  Result Date: 06/21/2020 CLINICAL DATA:  Constipation EXAM: ABDOMEN - 1 VIEW COMPARISON:  02/08/2018 FINDINGS: Streaky left lung base opacity. Surgical clips in the right upper quadrant. Nonobstructed bowel-gas pattern with mild stool. Calcified fibroid in the pelvis IMPRESSION: Negative. Electronically Signed   By: Donavan Foil M.D.   On: 06/21/2020 18:40   VAS [place] LOWER EXTREMITY VENOUS (DVT)  Result Date: 06/20/2020  Lower Venous DVT Study Patient Name:  Tracy Johnston  Date of Exam:   06/20/2020 Medical Rec #: [id number]     Accession #:    [id number] Date of Birth: 07/08/1950     Patient Gender: F Patient Age:   69Y Exam Location:  [facility] Procedure:      VAS [place] LOWER EXTREMITY VENOUS (DVT) Referring Phys: [id number] Alyson Locket OWENS --------------------------------------------------------------------------------  Indications: Erythema and calf swelling s/p LT arthroplasty 06/15/20.  Limitations: Poor ultrasound/tissue interface and patient pain tolerance. Comparison Study: No prior studies. Performing Technologist: Darlin Coco RDMS,RVT  Examination Guidelines: A complete evaluation includes B-mode imaging, spectral Doppler, color Doppler, and power Doppler as needed of all accessible portions of each vessel. Bilateral testing is considered an integral part of a complete examination. Limited examinations for reoccurring indications may be performed as noted. The reflux portion of the exam is performed with the patient in reverse Trendelenburg.  +-----+---------------+---------+-----------+----------+--------------+ RIGHTCompressibilityPhasicitySpontaneityPropertiesThrombus Aging +-----+---------------+---------+-----------+----------+--------------+ CFV  Full           Yes      Yes                                  +-----+---------------+---------+-----------+----------+--------------+   +---------+---------------+---------+-----------+----------+--------------+ LEFT     CompressibilityPhasicitySpontaneityPropertiesThrombus Aging +---------+---------------+---------+-----------+----------+--------------+ CFV      Full           Yes      Yes                                 +---------+---------------+---------+-----------+----------+--------------+ SFJ      Full                                                        +---------+---------------+---------+-----------+----------+--------------+ FV Prox  Full                                                        +---------+---------------+---------+-----------+----------+--------------+ FV Mid   Full                                                        +---------+---------------+---------+-----------+----------+--------------+ FV Distal               Yes      Yes                                 +---------+---------------+---------+-----------+----------+--------------+ PFV  Full                                                        +---------+---------------+---------+-----------+----------+--------------+ POP      Full           Yes      Yes                                 +---------+---------------+---------+-----------+----------+--------------+ PTV      Full                                                        +---------+---------------+---------+-----------+----------+--------------+ PERO     Full                                                        +---------+---------------+---------+-----------+----------+--------------+     Summary: RIGHT: - No evidence of common femoral vein obstruction.  LEFT: - There is no evidence of deep vein thrombosis in the lower extremity. However, portions of this examination were limited- see technologist comments above.  - No  cystic structure found in the popliteal fossa.  *See table(s) above for measurements and observations. Electronically signed by Harold Barban MD on 06/20/2020 at 9:21:39 PM.    Final     EKG: Independently reviewed.  Atrial fibrillation with RVR, rate 107.  Time spent: 35 minutes  [facility]  If 7PM-7AM, please contact night-coverage [email or web address] Password [facility] 06/21/2020, 6:59 PM

## 2020-06-21 NOTE — Progress Notes (Signed)
Patient ID: Tracy Johnston, female   DOB: 1950-12-06, 70 y.o.   MRN: [id number]   Patient was set for transfer to skilled nursing facility this afternoon.  I reviewed nurse's note in chart stating that patient recently complaining of shortness of breath, right-sided chest pain and having some wheezing.  Decreased O2 sats on room air.  I will hold discharge for now.  I ordered stat EKG and chest x-ray.  CSW advised and message sent to RN.

## 2020-06-21 NOTE — Progress Notes (Signed)
Report given to Juanita,LPN at [facility] in [place], all questions and concerns were fully addressed.

## 2020-06-21 NOTE — Progress Notes (Signed)
PT Cancellation Note  Patient Details Name: Tracy Johnston MRN: [id number] DOB: 1950/11/10   Cancelled Treatment:    Reason Eval/Treat Not Completed: (P) Medical issues which prohibited therapy (Pt remains to present with [facility] and R sided chest pain, will defer PM session.)   [name] [name] 06/21/2020, 3:00 PM  [name] , PTA Acute Rehabilitation Services Pager [phone] Office [phone]

## 2020-06-21 NOTE — TOC Transition Note (Addendum)
Transition of Care [facility]) - CM/SW Discharge Note   Patient Details  Name: Tracy Johnston MRN: [id number] Date of Birth: May 26, 1950  Transition [facility]) CM/SW Contact:  Milinda Antis, [place] Phone Number: 06/21/2020, 1:42 PM   Clinical Narrative:    Patient will DC to:  [facility] Anticipated DC date: 06/21/2020 Transport by: Corey Harold   Per MD patient ready for DC to SNF . RN to call report prior to discharge [phone]. RN, patient, and facility notified of DC. Discharge Summary and FL2 sent to facility. DC packet on chart. Ambulance transport requested for patient.   CSW will sign off for now as social work intervention is no longer needed. Please consult [place] again if new needs arise.  - CSW notified that the d/c will be held due to the patient being short of breath.  Facility notified.         Final next level of care: Skilled Nursing Facility Barriers to Discharge: Barriers Resolved   Patient Goals and CMS Choice Patient states their goals for this hospitalization and ongoing recovery are:: return home CMS Medicare.gov Compare Post Acute Care list provided to:: Patient Choice offered to / list presented to : Patient  Discharge Placement              Patient chooses bed at: Other - please specify in the comment section below: [facility]) Patient to be transferred to facility by: [place] Name of family member notified: Patient alert and oriented Patient and family notified of of transfer: 06/21/20  Discharge Plan and Services   Discharge Planning Services: CM Consult Post Acute Care Choice: Home Health                               Social Determinants of Health (SDOH) Interventions     Readmission Risk Interventions No flowsheet data found.

## 2020-06-21 NOTE — Progress Notes (Signed)
Patient ID: Tracy Johnston, female   DOB: [birth year], 70 y.o.   MRN: [id number]  Reviewed EKG and this revealed: response ST & T wave abnormality, consider anterolateral ischemia Abnormal ECG No significant change since last tracing Confirmed by Hope ([id number]) on 06/21/2020 3:17:42 PM (I compared this to previous EKG in chart November 2021 and this is unchanged.)  Chest x-ray:  CLINICAL DATA:  Wheezing.  EXAM: CHEST - 2 VIEW  COMPARISON:  Jun 13, 2020.  FINDINGS: Stable cardiomegaly. No pneumothorax is noted. Stable bibasilar interstitial opacities are noted concerning for scarring, although acute superimposed edema or inflammation cannot be excluded. Bony thorax is unremarkable.  IMPRESSION: Stable bibasilar interstitial opacities are noted concerning for scarring, although superimposed acute edema or inflammation cannot be excluded.   Electronically Signed   By: Marijo Conception M.D.   On: 06/21/2020 15:18  There may be some changes when compared to preop x-ray done Jun 13, 2020. Telemetry was ordered.  I did speak with hospitalist and he will do a consult.  Again patient's discharge to skilled facility was held today.

## 2020-06-22 ENCOUNTER — Inpatient Hospital Stay (HOSPITAL_COMMUNITY): Payer: Medicare HMO

## 2020-06-22 DIAGNOSIS — Z96652 Presence of left artificial knee joint: Secondary | ICD-10-CM | POA: Diagnosis not present

## 2020-06-22 LAB — CBC
HCT: 34.1 % — ABNORMAL LOW (ref 36.0–46.0)
Hemoglobin: 11.7 g/dL — ABNORMAL LOW (ref 12.0–15.0)
MCH: 30.8 pg (ref 26.0–34.0)
MCHC: 34.3 g/dL (ref 30.0–36.0)
MCV: 89.7 fL (ref 80.0–100.0)
Platelets: 278 10*3/uL (ref 150–400)
RBC: 3.8 MIL/uL — ABNORMAL LOW (ref 3.87–5.11)
RDW: 13.8 % (ref 11.5–15.5)
WBC: 20 10*3/uL — ABNORMAL HIGH (ref 4.0–10.5)
nRBC: 0 % (ref 0.0–0.2)

## 2020-06-22 LAB — COMPREHENSIVE METABOLIC PANEL
ALT: 13 U/L (ref 0–44)
AST: 19 U/L (ref 15–41)
Albumin: 2.2 g/dL — ABNORMAL LOW (ref 3.5–5.0)
Alkaline Phosphatase: 72 U/L (ref 38–126)
Anion gap: 8 (ref 5–15)
BUN: 18 mg/dL (ref 8–23)
CO2: 29 mmol/L (ref 22–32)
Calcium: 9 mg/dL (ref 8.9–10.3)
Chloride: 102 mmol/L (ref 98–111)
Creatinine, Ser: 1.15 mg/dL — ABNORMAL HIGH (ref 0.44–1.00)
GFR, Estimated: 51 mL/min — ABNORMAL LOW (ref >60)
Glucose, Bld: 112 mg/dL — ABNORMAL HIGH (ref 70–99)
Potassium: 3.3 mmol/L — ABNORMAL LOW (ref 3.5–5.1)
Sodium: 139 mmol/L (ref 135–145)
Total Bilirubin: 1.1 mg/dL (ref 0.3–1.2)
Total Protein: 6.2 g/dL — ABNORMAL LOW (ref 6.5–8.1)

## 2020-06-22 LAB — AMYLASE: Amylase: 58 U/L (ref 28–100)

## 2020-06-22 LAB — LIPASE, BLOOD: Lipase: 22 U/L (ref 11–51)

## 2020-06-22 MED ORDER — POTASSIUM CHLORIDE CRYS ER 20 MEQ PO TBCR
40.0000 meq | EXTENDED_RELEASE_TABLET | Freq: Once | ORAL | Status: AC
  Administered 2020-06-22: 40 meq via ORAL
  Filled 2020-06-22: qty 2

## 2020-06-22 MED ORDER — TECHNETIUM TO 99M ALBUMIN AGGREGATED
4.2000 | Freq: Once | INTRAVENOUS | Status: AC | PRN
  Administered 2020-06-22: 4.2 via INTRAVENOUS

## 2020-06-22 NOTE — Plan of Care (Signed)
  Problem: Pain Managment: Goal: General experience of comfort will improve Outcome: Progressing   

## 2020-06-22 NOTE — Progress Notes (Addendum)
PT Cancellation Note  Patient Details Name: Tracy Johnston MRN: [id number] DOB: Feb 20, 1950   Cancelled Treatment:    Reason Eval/Treat Not Completed: Medical issues which prohibited therapy Pt currently awaiting further testing for PE. Will hold until pt medically cleared and follow up as schedule allows.   1418: checked on pt again and pt still awaiting imaging to check for PE. Will follow up as schedule allows.    [name], DPT  Acute Rehabilitation Services  Pager: [phone] Office: [phone]    [name] 06/22/2020, 8:50 AM

## 2020-06-22 NOTE — Progress Notes (Signed)
OT Cancellation Note  Patient Details Name: Constanza Mincy MRN: [id number] DOB: February 06, 1950   Cancelled Treatment:    Reason Eval/Treat Not Completed: Medical issues which prohibited therapy;Other (comment) (Pt experiencing chest pain and SOB, awaiting reults of PE testing. Will wait to proceed with therapy until medically cleared.)  Gurneet Matarese A Thara Searing 06/22/2020, 1:45 PM

## 2020-06-22 NOTE — TOC Progression Note (Signed)
Transition of Care [facility]) - Progression Note    Patient Details  Name: Tracy Johnston MRN: [id number] Date of Birth: [birth year]  Transition of Care [facility]) CM/SW Contact  Milinda Antis, [place] Phone Number: 06/22/2020, 2:51 PM  Clinical Narrative:    CSW caled [facility] and requested that the insurance authorization approval be extended.  The extension was granted.  The authorization period is from 06/21/2020- 06/26/2020.  Insurance [name] number is [id number].    [facility] is holding a bed open for the patient.  The patient can be admitted on Saturday, but the d/c summary needs to be sent to the facility before 10am.  Call report to [phone] and ask what number to fax the d/c summary to.  The facility does not accept patients on Sunday's.    The patient can be admitted on Monday (memorial day), 5/29,  [facility] @ [phone] and ask to speak with Threasa Beards at ex. [phone]   Expected Discharge Plan: [place] Barriers to Discharge: Barriers Resolved  Expected Discharge Plan and Services Expected Discharge Plan: [place]   Discharge Planning Services: CM Consult Post Acute Care Choice: [place] arrangements for the past 2 months: Single Family Home                                       Social Determinants of Health (SDOH) Interventions    Readmission Risk Interventions No flowsheet data found.

## 2020-06-22 NOTE — Progress Notes (Signed)
PROGRESS NOTE    Tracy Johnston  GQQ:[id number] DOB: 1950/07/31 DOA: 06/15/2020 PCP: Leeanne Rio, MD   Brief Narrative: 70 year old female with history of COPD, hypertension, A. fib, asthma and gout and other comorbidities who was admitted under orthopedic surgery underwent right total left knee replacement and was waiting for discharge to skilled nursing facility once he started to have pain on the right side of the chest and shortness of breath and medicine team was consulted reparations was called.  Patient was evaluated by hospitalist team and underwent extensive work-up including troponin and serial there were negative procalcitonin negative had leukocytosis, D-dimers were elevated in 3, VQ scan was done and CT abdomen pelvis also ordered due to concern for hernia and pain on the right side  Subjective: Seen and examined this morning She reports she has some pain in the right lower chest on the side/right lateral upper abdomen with a mass bulging out but reducible She is doing well on room air.  No fever.  Assessment & Plan:  Left knee osteoarthritis status post left knee replacement continue plan of care DVT prophylaxis PT OT pain control as per primary orthopedic surgery team.  Right chest pain/right lateral abdominal pain/Rapid Response at time of discharge 5/26:evaluated by hospitalist team and underwent extensive work-up including troponin and serial there were negative procalcitonin negative had leukocytosis, D-dimers were elevated in 3, VQ scan was done and CT abdomen pelvis also ordered due to concern for hernia and pain on the right side.  Low suspicion of PE, patient already anticoagulated.  She is afebrile but has leukocytosis has been empirically placed on antibiotics but unclear infection at this time.  Duplex of the liver negative for DVT.  I will order lipase, amylase and CMP thsi am. follow-up CT abdomen pelvis, there is concern for hernia at the site of previous right  kidney surgery, follow-up VQ scan, symptomatic management.  COPD no wheezing not in exacerbation.  Continue her Incruse Ellipta/Trelegy, as needed bronchodilators, Singulair.  Leukocytosis unclear etiology, has been running on higher side with slight worsening yesterday. In the setting of postop status, Pro-Cal negative reassuring repeat lab empirically placed on ceftriaxone azithromycin yesterday. Recent Labs  Lab 06/16/20 0430 06/17/20 0233 06/18/20 0254 06/21/20 1629 06/21/20 1743  WBC 13.8* 16.3* 16.0* 17.9*  --   PROCALCITON  --   --   --   --  <0.10   Hypertension blood pressure controlled on Benicar HCTZ at home and being replaced here.  Monitor  HLD on simvastatin.  A. fib on anticoagulation with Xarelto, rate controlled on Cardizem.  Gout: On allopurinol  Morbid obesity BMI 35 will benefit with weight loss.  Outpatient GI follow-up and healthy lifestyle and diet.  Diet Order            Diet regular Room service appropriate? Yes with Assist; Fluid consistency: Thin  Diet effective now               Patient's Body mass index is 35.19 kg/m.  DVT prophylaxis: SCDs Start: 06/15/20 1222 Xarelto Code Status:   Code Status: Full Code  Family Communication: plan of care discussed with patient at bedside.  Status is: Inpatient Remains inpatient appropriate because:Inpatient level of care appropriate due to severity of illness  Dispo: The patient is from: Home              Anticipated d/c is to: SNF              Patient currently  is not medically stable to d/c.   Difficult to place patient No Unresulted Labs (From admission, onward)          Start     Ordered   06/21/20 1853  Urinalysis, Routine w reflex microscopic Urine, Unspecified Source  Once,   R        06/21/20 1857   Signed and Held  Surgical pcr screen  Once,   R        Signed and Held          Medications reviewed:  Scheduled Meds: . allopurinol  300 mg Oral Daily  . azithromycin  500 mg Oral Daily   . diltiazem  300 mg Oral Daily  . docusate sodium  100 mg Oral BID  . ferrous sulfate  325 mg Oral TID PC  . fluticasone furoate-vilanterol  1 puff Inhalation Daily  . hydrochlorothiazide  12.5 mg Oral Daily  . irbesartan  300 mg Oral Daily  . mirabegron ER  50 mg Oral Daily  . montelukast  10 mg Oral QHS  . polyethylene glycol  17 g Oral QODAY  . rivaroxaban  20 mg Oral Q supper  . simvastatin  20 mg Oral Daily  . umeclidinium bromide  1 puff Inhalation Daily   Continuous Infusions: . sodium chloride Stopped (06/16/20 1200)  . cefTRIAXone (ROCEPHIN)  IV 2 g (06/21/20 2133)    Consultants:see note  Procedures:see note  Antimicrobials: Anti-infectives (From admission, onward)   Start     Dose/Rate Route Frequency Ordered Stop   06/21/20 2000  cefTRIAXone (ROCEPHIN) 2 g in sodium chloride 0.9 % 100 mL IVPB        2 g 200 mL/hr over 30 Minutes Intravenous Every 24 hours 06/21/20 1857 06/26/20 1959   06/21/20 2000  azithromycin (ZITHROMAX) tablet 500 mg        500 mg Oral Daily 06/21/20 1857 06/24/20 0959   06/15/20 0600  ceFAZolin (ANCEF) IVPB 2g/100 mL premix        2 g 200 mL/hr over 30 Minutes Intravenous On call to O.R. 06/15/20 0549 06/15/20 0746     Culture/Microbiology No results found for: SDES, SPECREQUEST, CULT, REPTSTATUS  Other culture-see note  Objective: Vitals: Today's Vitals   06/22/20 0030 06/22/20 0429 06/22/20 0514 06/22/20 0613  BP:    118/78  Pulse:    84  Resp:    16  Temp:    98.9 F (37.2 C)  TempSrc:    Oral  SpO2:    (!) 89%  Weight:      Height:      PainSc: 0-No pain 7  6      Intake/Output Summary (Last 24 hours) at 06/22/2020 1103 Last data filed at 06/22/2020 1054 Gross per 24 hour  Intake 240 ml  Output 500 ml  Net -260 ml   Filed Weights   06/15/20 0559  Weight: 93 kg   Weight change:   Intake/Output from previous day: No intake/output data recorded. Intake/Output this shift: Total I/O In: 240 [P.O.:240] Out: 500  [Urine:500] Filed Weights   06/15/20 0559  Weight: 93 kg    Examination: General exam: AAO x3 , older than stated age, weak appearing. HEENT:Oral mucosa moist, Ear/Nose WNL grossly,dentition normal. Respiratory system: bilaterally diminished,no use of accessory muscle, non tender.  Tender right lower chest wall Cardiovascular system: S1 & S2 +,o JVD. Gastrointestinal system: Abdomen soft, swollen reducible mass in the right abdomen tender, bowel sound present  Nervous System:Alert, awake,  moving extremities Extremities: no edema, distal peripheral pulses palpable.  Skin: No rashes,no icterus. MSK: Normal muscle bulk,tone, power.  Left knee with dressing and support brace in place  Data Reviewed: I have personally reviewed following labs and imaging studies CBC: Recent Labs  Lab 06/16/20 0430 06/17/20 0233 06/18/20 0254 06/21/20 1629  WBC 13.8* 16.3* 16.0* 17.9*  NEUTROABS  --   --   --  16.3*  HGB 14.3 14.0 13.7 12.7  HCT 40.3 40.3 38.5 36.1  MCV 89.6 89.4 88.5 88.5  PLT 193 176 169 222   Basic Metabolic Panel: No results for input(s): NA, K, CL, CO2, GLUCOSE, BUN, CREATININE, CALCIUM, MG, PHOS in the last 168 hours. GFR: Estimated Creatinine Clearance: 54.8 mL/min (A) (by C-G formula based on SCr of 1.04 mg/dL (H)). Liver Function Tests: No results for input(s): AST, ALT, ALKPHOS, BILITOT, PROT, ALBUMIN in the last 168 hours. No results for input(s): LIPASE, AMYLASE in the last 168 hours. No results for input(s): AMMONIA in the last 168 hours. Coagulation Profile: No results for input(s): INR, PROTIME in the last 168 hours. Cardiac Enzymes: No results for input(s): CKTOTAL, CKMB, CKMBINDEX, TROPONINI in the last 168 hours. BNP (last 3 results) No results for input(s): PROBNP in the last 8760 hours. HbA1C: No results for input(s): HGBA1C in the last 72 hours. CBG: No results for input(s): GLUCAP in the last 168 hours. Lipid Profile: No results for input(s): CHOL,  HDL, LDLCALC, TRIG, CHOLHDL, LDLDIRECT in the last 72 hours. Thyroid Function Tests: No results for input(s): TSH, T4TOTAL, FREET4, T3FREE, THYROIDAB in the last 72 hours. Anemia Panel: Recent Labs    06/21/20 1743  RETICCTPCT 1.8   Sepsis Labs: Recent Labs  Lab 06/21/20 1743  PROCALCITON <0.10    Recent Results (from the past 240 hour(s))  Surgical pcr screen     Status: None   Collection Time: 06/13/20 10:56 AM   Specimen: Nasal Mucosa; Nasal Swab  Result Value Ref Range Status   MRSA, PCR NEGATIVE NEGATIVE Final   Staphylococcus aureus NEGATIVE NEGATIVE Final    Comment: (NOTE) The Xpert SA Assay (FDA approved for NASAL specimens in patients 65 years of age and older), is one component of a comprehensive surveillance program. It is not intended to diagnose infection nor to guide or monitor treatment. Performed at [facility], [place] [address], [place], [place] [address]   SARS CORONAVIRUS 2 (TAT 6-24 HRS) Nasopharyngeal Nasopharyngeal Swab     Status: None   Collection Time: 06/13/20 10:58 AM   Specimen: Nasopharyngeal Swab  Result Value Ref Range Status   SARS Coronavirus 2 NEGATIVE NEGATIVE Final    Comment: (NOTE) SARS-CoV-2 target nucleic acids are NOT DETECTED.  The SARS-CoV-2 RNA is generally detectable in upper and lower respiratory specimens during the acute phase of infection. Negative results do not preclude SARS-CoV-2 infection, do not rule out co-infections with other pathogens, and should not be used as the sole basis for treatment or other patient management decisions. Negative results must be combined with clinical observations, patient history, and epidemiological information. The expected result is Negative.  Fact Sheet for Patients: SugarRoll.be  Fact Sheet for Healthcare Providers: https://www.woods-mathews.com/  This test is not yet approved or cleared by the [place] FDA and  has been  authorized for detection and/or diagnosis of SARS-CoV-2 by FDA under an Emergency Use Authorization (EUA). This EUA will remain  in effect (meaning this test can be used) for the duration of the COVID-19 declaration  under Se ction 564(b)(1) of the Act, 21 U.S.C. section 360bbb-3(b)(1), unless the authorization is terminated or revoked sooner.  Performed at [facility], [place] [address], [place], [place] [address]   SARS CORONAVIRUS 2 (TAT 6-24 HRS) Nasopharyngeal Nasopharyngeal Swab     Status: None   Collection Time: 06/20/20  1:34 PM   Specimen: Nasopharyngeal Swab  Result Value Ref Range Status   SARS Coronavirus 2 NEGATIVE NEGATIVE Final    Comment: (NOTE) SARS-CoV-2 target nucleic acids are NOT DETECTED.  The SARS-CoV-2 RNA is generally detectable in upper and lower respiratory specimens during the acute phase of infection. Negative results do not preclude SARS-CoV-2 infection, do not rule out co-infections with other pathogens, and should not be used as the sole basis for treatment or other patient management decisions. Negative results must be combined with clinical observations, patient history, and epidemiological information. The expected result is Negative.  Fact Sheet for Patients: SugarRoll.be  Fact Sheet for Healthcare Providers: https://www.woods-mathews.com/  This test is not yet approved or cleared by the [place] FDA and  has been authorized for detection and/or diagnosis of SARS-CoV-2 by FDA under an Emergency Use Authorization (EUA). This EUA will remain  in effect (meaning this test can be used) for the duration of the COVID-19 declaration under Se ction 564(b)(1) of the Act, 21 U.S.C. section 360bbb-3(b)(1), unless the authorization is terminated or revoked sooner.  Performed at [facility], [place] [address] [place], [place] [address]      Radiology Studies: DG Chest 2 View  Result Date:  06/21/2020 CLINICAL DATA:  Wheezing. EXAM: CHEST - 2 VIEW COMPARISON:  Jun 13, 2020. FINDINGS: Stable cardiomegaly. No pneumothorax is noted. Stable bibasilar interstitial opacities are noted concerning for scarring, although acute superimposed edema or inflammation cannot be excluded. Bony thorax is unremarkable. IMPRESSION: Stable bibasilar interstitial opacities are noted concerning for scarring, although superimposed acute edema or inflammation cannot be excluded. Electronically Signed   By: Marijo Conception M.D.   On: 06/21/2020 15:18   DG Abd 1 View  Result Date: 06/21/2020 CLINICAL DATA:  Constipation EXAM: ABDOMEN - 1 VIEW COMPARISON:  02/08/2018 FINDINGS: Streaky left lung base opacity. Surgical clips in the right upper quadrant. Nonobstructed bowel-gas pattern with mild stool. Calcified fibroid in the pelvis IMPRESSION: Negative. Electronically Signed   By: Donavan Foil M.D.   On: 06/21/2020 18:40   VAS [place] LOWER EXTREMITY VENOUS (DVT)  Result Date: 06/20/2020  Lower Venous DVT Study Patient Name:  SALIYAH GILLIN  Date of Exam:   06/20/2020 Medical Rec #: [id number]     Accession #:    [id number] Date of Birth: [birth year]     Patient Gender: F Patient Age:   69Y Exam Location:  [facility] Procedure:      VAS [place] LOWER EXTREMITY VENOUS (DVT) Referring Phys: [id number] Alyson Locket OWENS --------------------------------------------------------------------------------  Indications: Erythema and calf swelling s/p LT arthroplasty 06/15/20.  Limitations: Poor ultrasound/tissue interface and patient pain tolerance. Comparison Study: No prior studies. Performing Technologist: Darlin Coco RDMS,RVT  Examination Guidelines: A complete evaluation includes B-mode imaging, spectral Doppler, color Doppler, and power Doppler as needed of all accessible portions of each vessel. Bilateral testing is considered an integral part of a complete examination. Limited examinations for reoccurring indications may be performed as  noted. The reflux portion of the exam is performed with the patient in reverse Trendelenburg.  +-----+---------------+---------+-----------+----------+--------------+ RIGHTCompressibilityPhasicitySpontaneityPropertiesThrombus Aging +-----+---------------+---------+-----------+----------+--------------+ CFV  Full  Yes      Yes                                 +-----+---------------+---------+-----------+----------+--------------+   +---------+---------------+---------+-----------+----------+--------------+ LEFT     CompressibilityPhasicitySpontaneityPropertiesThrombus Aging +---------+---------------+---------+-----------+----------+--------------+ CFV      Full           Yes      Yes                                 +---------+---------------+---------+-----------+----------+--------------+ SFJ      Full                                                        +---------+---------------+---------+-----------+----------+--------------+ FV Prox  Full                                                        +---------+---------------+---------+-----------+----------+--------------+ FV Mid   Full                                                        +---------+---------------+---------+-----------+----------+--------------+ FV Distal               Yes      Yes                                 +---------+---------------+---------+-----------+----------+--------------+ PFV      Full                                                        +---------+---------------+---------+-----------+----------+--------------+ POP      Full           Yes      Yes                                 +---------+---------------+---------+-----------+----------+--------------+ PTV      Full                                                        +---------+---------------+---------+-----------+----------+--------------+ PERO     Full                                                         +---------+---------------+---------+-----------+----------+--------------+     Summary: RIGHT: -  No evidence of common femoral vein obstruction.  LEFT: - There is no evidence of deep vein thrombosis in the lower extremity. However, portions of this examination were limited- see technologist comments above.  - No cystic structure found in the popliteal fossa.  *See table(s) above for measurements and observations. Electronically signed by Harold Barban MD on 06/20/2020 at 9:21:39 PM.    Final      LOS: 5 days   Antonieta Pert, MD [facility]  06/22/2020, 11:03 AM

## 2020-06-22 NOTE — Progress Notes (Signed)
Subjective: Left knee doing ok.  C/o right lower chest/upper quad pain still.  Awaiting results of studies ordered by hospitalist.     Objective: Vital signs in last 24 hours: Temp:  [97.5 F (36.4 C)-98.9 F (37.2 C)] 97.5 F (36.4 C) (05/27 1231) Pulse Rate:  [77-84] 77 (05/27 1231) Resp:  [15-22] 15 (05/27 1231) BP: (102-139)/(63-84) 102/63 (05/27 1231) SpO2:  [89 %-96 %] 90 % (05/27 1231)  Intake/Output from previous day: No intake/output data recorded. Intake/Output this shift: Total I/O In: 240 [P.O.:240] Out: 500 [Urine:500]  Recent Labs    06/21/20 1629  HGB 12.7   Recent Labs    06/21/20 1629 06/21/20 1743  WBC 17.9*  --   RBC 4.08 4.27  HCT 36.1  --   PLT 257  --    No results for input(s): NA, K, CL, CO2, BUN, CREATININE, GLUCOSE, CALCIUM in the last 72 hours. No results for input(s): LABPT, INR in the last 72 hours.  Exam: Left knee wound looks good.  Staples intact.  No drainage or signs of infection.  NVI.     Assessment/Plan: -no discharge to SNF until cleared medically.   -continue present care for left knee.    (GREATLY APPRECIATE MEDICINE TEAM HELP)      Benjiman Core 06/22/2020, 12:39 PM

## 2020-06-23 DIAGNOSIS — M1712 Unilateral primary osteoarthritis, left knee: Secondary | ICD-10-CM | POA: Diagnosis not present

## 2020-06-23 DIAGNOSIS — R062 Wheezing: Principal | ICD-10-CM

## 2020-06-23 DIAGNOSIS — Z96652 Presence of left artificial knee joint: Secondary | ICD-10-CM | POA: Diagnosis not present

## 2020-06-23 LAB — CBC
HCT: 33.9 % — ABNORMAL LOW (ref 36.0–46.0)
Hemoglobin: 11.9 g/dL — ABNORMAL LOW (ref 12.0–15.0)
MCH: 31.3 pg (ref 26.0–34.0)
MCHC: 35.1 g/dL (ref 30.0–36.0)
MCV: 89.2 fL (ref 80.0–100.0)
Platelets: 288 K/uL (ref 150–400)
RBC: 3.8 MIL/uL — ABNORMAL LOW (ref 3.87–5.11)
RDW: 13.8 % (ref 11.5–15.5)
WBC: 19.4 K/uL — ABNORMAL HIGH (ref 4.0–10.5)
nRBC: 0 % (ref 0.0–0.2)

## 2020-06-23 LAB — COMPREHENSIVE METABOLIC PANEL
ALT: 15 U/L (ref 0–44)
AST: 16 U/L (ref 15–41)
Albumin: 2.2 g/dL — ABNORMAL LOW (ref 3.5–5.0)
Alkaline Phosphatase: 75 U/L (ref 38–126)
Anion gap: 12 (ref 5–15)
BUN: 17 mg/dL (ref 8–23)
CO2: 24 mmol/L (ref 22–32)
Calcium: 9.2 mg/dL (ref 8.9–10.3)
Chloride: 105 mmol/L (ref 98–111)
Creatinine, Ser: 1.1 mg/dL — ABNORMAL HIGH (ref 0.44–1.00)
GFR, Estimated: 54 mL/min — ABNORMAL LOW (ref >60)
Glucose, Bld: 97 mg/dL (ref 70–99)
Potassium: 4 mmol/L (ref 3.5–5.1)
Sodium: 141 mmol/L (ref 135–145)
Total Bilirubin: 0.8 mg/dL (ref 0.3–1.2)
Total Protein: 6.1 g/dL — ABNORMAL LOW (ref 6.5–8.1)

## 2020-06-23 MED ORDER — PREDNISONE 20 MG PO TABS
40.0000 mg | ORAL_TABLET | Freq: Every day | ORAL | Status: DC
  Administered 2020-06-24 – 2020-06-25 (×2): 40 mg via ORAL
  Filled 2020-06-23 (×2): qty 2

## 2020-06-23 MED ORDER — LIDOCAINE 5 % EX PTCH
1.0000 | MEDICATED_PATCH | CUTANEOUS | Status: DC
  Administered 2020-06-23 – 2020-06-27 (×5): 1 via TRANSDERMAL
  Filled 2020-06-23 (×5): qty 1

## 2020-06-23 MED ORDER — FLEET ENEMA 7-19 GM/118ML RE ENEM
1.0000 | ENEMA | Freq: Once | RECTAL | Status: AC
  Administered 2020-06-23: 1 via RECTAL
  Filled 2020-06-23: qty 1

## 2020-06-23 NOTE — Progress Notes (Signed)
PROGRESS NOTE    Tracy Johnston  ERD:[id number] DOB: 08-17-1950 DOA: 06/15/2020 PCP: Leeanne Rio, MD   Brief Narrative: 70 year old female with history of COPD, hypertension, A. fib, asthma and gout and other comorbidities who was admitted under orthopedic surgery underwent right total left knee replacement and was waiting for discharge to skilled nursing facility once he started to have pain on the right side of the chest and shortness of breath and medicine team was consulted reparations was called.  Patient was evaluated by hospitalist team and underwent extensive work-up including troponin and serial there were negative procalcitonin negative had leukocytosis, D-dimers were elevated in 3, VQ scan was done and CT abdomen pelvis also ordered due to concern for hernia and pain on the right side.  06/23/2020: Patient seen.  Patient continues to report right lateral chest wall pain.  Patient was also noted to be on prednisone as needed.  Will change to prednisone 40 Mg p.o. once daily.  We will continue nebulizer treatment.  We will continue treatment for possible pneumonia.  Will place lidocaine patch to right lateral chest wall area.  Subjective: Patient reports right lateral chest wall pain.  Assessment & Plan:  Left knee osteoarthritis status post left knee replacement continue plan of care DVT prophylaxis PT OT pain control as per primary orthopedic surgery team.  Right chest pain/right lateral abdominal pain/Rapid Response at time of discharge 5/26:evaluated by hospitalist team and underwent extensive work-up including troponin and serial there were negative procalcitonin negative had leukocytosis, D-dimers were elevated in 3, VQ scan was done and CT abdomen pelvis also ordered due to concern for hernia and pain on the right side.  Low suspicion of PE, patient already anticoagulated.  She is afebrile but has leukocytosis has been empirically placed on antibiotics but unclear infection at  this time.  Duplex of the liver negative for DVT.  I will order lipase, amylase and CMP thsi am. follow-up CT abdomen pelvis, there is concern for hernia at the site of previous right kidney surgery, follow-up VQ scan, symptomatic management. 06/23/2020: CT scan of the abdomen and pelvis done on 06/22/2020 revealed:  1. No acute intra-abdominal or pelvic pathology. 2. Colonic diverticulosis. No bowel obstruction. Normal appendix. 3. Broad-based right lateral hernia containing a segment of the colon. 4. Status post prior right nephrectomy. 5. Aortic Atherosclerosis (ICD10-I70.0).  Will apply lidocaine patch to the right lateral chest area.  COPD: -Continue nebs treatment. -Start prednisone 40 Mg p.o. once daily.   Leukocytosis unclear etiology, has been running on higher side with slight worsening yesterday. In the setting of postop status, Pro-Cal negative reassuring repeat lab empirically placed on ceftriaxone azithromycin yesterday. 06/23/2020: Continue to monitor WBC. Recent Labs  Lab 06/17/20 0233 06/18/20 0254 06/21/20 1629 06/21/20 1743 06/22/20 1330 06/23/20 0115  WBC 16.3* 16.0* 17.9*  --  20.0* 19.4*  PROCALCITON  --   --   --  <0.10  --   --    Hypertension: -Continue to monitor closely.    HLD on simvastatin.  A. fib on anticoagulation with Xarelto, rate controlled on Cardizem.  Gout: On allopurinol  Morbid obesity BMI 35 will benefit with weight loss.  Outpatient GI follow-up and healthy lifestyle and diet.  Diet Order            Diet regular Room service appropriate? Yes with Assist; Fluid consistency: Thin  Diet effective now               Patient's Body mass  index is 35.19 kg/m.  DVT prophylaxis: SCDs Start: 06/15/20 1222 Xarelto Code Status:   Code Status: Full Code  Family Communication: plan of care discussed with patient at bedside.  Status is: Inpatient Remains inpatient appropriate because:Inpatient level of care appropriate due to severity of  illness  Dispo: The patient is from: Home              Anticipated d/c is to: SNF              Patient currently is not medically stable to d/c.   Difficult to place patient No Unresulted Labs (From admission, onward)          Start     Ordered   06/21/20 1853  Urinalysis, Routine w reflex microscopic Urine, Unspecified Source  Once,   R        06/21/20 1857   Signed and Held  Surgical pcr screen  Once,   R        Signed and Held          Medications reviewed:  Scheduled Meds: . allopurinol  300 mg Oral Daily  . diltiazem  300 mg Oral Daily  . docusate sodium  100 mg Oral BID  . ferrous sulfate  325 mg Oral TID PC  . fluticasone furoate-vilanterol  1 puff Inhalation Daily  . hydrochlorothiazide  12.5 mg Oral Daily  . irbesartan  300 mg Oral Daily  . lidocaine  1 patch Transdermal Q24H  . mirabegron ER  50 mg Oral Daily  . montelukast  10 mg Oral QHS  . polyethylene glycol  17 g Oral QODAY  . [START ON 06/24/2020] predniSONE  40 mg Oral Q breakfast  . rivaroxaban  20 mg Oral Q supper  . simvastatin  20 mg Oral Daily  . umeclidinium bromide  1 puff Inhalation Daily   Continuous Infusions: . sodium chloride Stopped (06/16/20 1200)  . cefTRIAXone (ROCEPHIN)  IV Stopped (06/23/20 5631)    Consultants:see note  Procedures:see note  Antimicrobials: Anti-infectives (From admission, onward)   Start     Dose/Rate Route Frequency Ordered Stop   06/21/20 2000  cefTRIAXone (ROCEPHIN) 2 g in sodium chloride 0.9 % 100 mL IVPB        2 g 200 mL/hr over 30 Minutes Intravenous Every 24 hours 06/21/20 1857 06/26/20 1959   06/21/20 2000  azithromycin (ZITHROMAX) tablet 500 mg        500 mg Oral Daily 06/21/20 1857 06/23/20 0939   06/15/20 0600  ceFAZolin (ANCEF) IVPB 2g/100 mL premix        2 g 200 mL/hr over 30 Minutes Intravenous On call to O.R. 06/15/20 0549 06/15/20 0746     Culture/Microbiology No results found for: SDES, SPECREQUEST, CULT, REPTSTATUS  Other culture-see  note  Objective: Vitals: Today's Vitals   06/23/20 0803 06/23/20 0900 06/23/20 1025 06/23/20 1515  BP: 136/72   116/67  Pulse: 82   80  Resp: 17 20  18   Temp: 97.8 F (36.6 C)   98 F (36.7 C)  TempSrc: Oral   Oral  SpO2: 92%   93%  Weight:      Height:      PainSc:  9  5      Intake/Output Summary (Last 24 hours) at 06/23/2020 1552 Last data filed at 06/23/2020 1500 Gross per 24 hour  Intake 100 ml  Output 750 ml  Net -650 ml   Filed Weights   06/15/20 0559  Weight: 93  kg   Weight change:   Intake/Output from previous day: 05/27 0701 - 05/28 0700 In: 240 [P.O.:240] Out: 1250 [Urine:1250] Intake/Output this shift: Total I/O In: 100 [IV Piggyback:100] Out: -  Filed Weights   06/15/20 0559  Weight: 93 kg    Examination: General exam: AAO x3 , older than stated age. HEENT:Oral mucosa moist, Ear/Nose WNL grossly,dentition normal. Respiratory system: bilaterally diminished Cardiovascular system: S1 & S2  Gastrointestinal system: Abdomen soft, swollen reducible mass in the right abdomen tender, bowel sound present  Nervous System:Alert, awake, moving extremities Extremities: Left lower extremity edema.    Data Reviewed: I have personally reviewed following labs and imaging studies CBC: Recent Labs  Lab 06/17/20 0233 06/18/20 0254 06/21/20 1629 06/22/20 1330 06/23/20 0115  WBC 16.3* 16.0* 17.9* 20.0* 19.4*  NEUTROABS  --   --  16.3*  --   --   HGB 14.0 13.7 12.7 11.7* 11.9*  HCT 40.3 38.5 36.1 34.1* 33.9*  MCV 89.4 88.5 88.5 89.7 89.2  PLT 176 169 257 278 470   Basic Metabolic Panel: Recent Labs  Lab 06/22/20 1330 06/23/20 0115  NA 139 141  K 3.3* 4.0  CL 102 105  CO2 29 24  GLUCOSE 112* 97  BUN 18 17  CREATININE 1.15* 1.10*  CALCIUM 9.0 9.2   GFR: Estimated Creatinine Clearance: 51.8 mL/min (A) (by C-G formula based on SCr of 1.1 mg/dL (H)). Liver Function Tests: Recent Labs  Lab 06/22/20 1330 06/23/20 0115  AST 19 16  ALT 13 15   ALKPHOS 72 75  BILITOT 1.1 0.8  PROT 6.2* 6.1*  ALBUMIN 2.2* 2.2*   Recent Labs  Lab 06/22/20 1330  LIPASE 22  AMYLASE 58   No results for input(s): AMMONIA in the last 168 hours. Coagulation Profile: No results for input(s): INR, PROTIME in the last 168 hours. Cardiac Enzymes: No results for input(s): CKTOTAL, CKMB, CKMBINDEX, TROPONINI in the last 168 hours. BNP (last 3 results) No results for input(s): PROBNP in the last 8760 hours. HbA1C: No results for input(s): HGBA1C in the last 72 hours. CBG: No results for input(s): GLUCAP in the last 168 hours. Lipid Profile: No results for input(s): CHOL, HDL, LDLCALC, TRIG, CHOLHDL, LDLDIRECT in the last 72 hours. Thyroid Function Tests: No results for input(s): TSH, T4TOTAL, FREET4, T3FREE, THYROIDAB in the last 72 hours. Anemia Panel: Recent Labs    06/21/20 1743  RETICCTPCT 1.8   Sepsis Labs: Recent Labs  Lab 06/21/20 1743  PROCALCITON <0.10    Recent Results (from the past 240 hour(s))  SARS CORONAVIRUS 2 (TAT 6-24 HRS) Nasopharyngeal Nasopharyngeal Swab     Status: None   Collection Time: 06/20/20  1:34 PM   Specimen: Nasopharyngeal Swab  Result Value Ref Range Status   SARS Coronavirus 2 NEGATIVE NEGATIVE Final    Comment: (NOTE) SARS-CoV-2 target nucleic acids are NOT DETECTED.  The SARS-CoV-2 RNA is generally detectable in upper and lower respiratory specimens during the acute phase of infection. Negative results do not preclude SARS-CoV-2 infection, do not rule out co-infections with other pathogens, and should not be used as the sole basis for treatment or other patient management decisions. Negative results must be combined with clinical observations, patient history, and epidemiological information. The expected result is Negative.  Fact Sheet for Patients: SugarRoll.be  Fact Sheet for Healthcare Providers: https://www.woods-mathews.com/  This test is not  yet approved or cleared by the [place] FDA and  has been authorized for detection and/or diagnosis of SARS-CoV-2  by FDA under an Emergency Use Authorization (EUA). This EUA will remain  in effect (meaning this test can be used) for the duration of the COVID-19 declaration under Se ction 564(b)(1) of the Act, 21 U.S.C. section 360bbb-3(b)(1), unless the authorization is terminated or revoked sooner.  Performed at [facility], [place] [address] [place], [place] [address]      Radiology Studies: CT ABDOMEN PELVIS WO CONTRAST  Result Date: 06/22/2020 CLINICAL DATA:  70 year old female with abdominal pain. Concern for hernia. EXAM: CT ABDOMEN AND PELVIS WITHOUT CONTRAST TECHNIQUE: Multidetector CT imaging of the abdomen and pelvis was performed following the standard protocol without IV contrast. COMPARISON:  Abdominal radiograph dated 06/21/2020. CT abdomen pelvis dated 09/13/2012. FINDINGS: Evaluation of this exam is limited in the absence of intravenous contrast as well as due to respiratory motion artifact. Lower chest: Patchy area of ground-glass and streaky density at the right lung base concerning for pneumonia. Clinical correlation is recommended. There is mild cardiomegaly. No intra-abdominal free air or free fluid. Hepatobiliary: The liver is unremarkable. No intrahepatic biliary ductal dilatation. No calcified gallstone or pericholecystic fluid. Pancreas: Unremarkable. No pancreatic ductal dilatation or surrounding inflammatory changes. Spleen: Normal in size without focal abnormality. Adrenals/Urinary Tract: The adrenal glands are unremarkable. Status post prior right nephrectomy. There is no hydronephrosis or nephrolithiasis on the left. The left ureter and urinary bladder appear unremarkable. Stomach/Bowel: There is sigmoid diverticulosis and diffuse colonic diverticula without active inflammatory changes. There is no bowel obstruction or active inflammation. The appendix is  normal. Vascular/Lymphatic: Advanced aortoiliac atherosclerotic disease. The IVC is unremarkable. No portal venous gas. There is no adenopathy. Reproductive: The uterus is anteverted. There is a 2 cm calcified fibroid. No adnexal masses. Other: There is a broad-based right lateral hernia containing segment of the colon. Musculoskeletal: Osteopenia with degenerative changes of the spine. The disc a shin and vacuum phenomena at L4-L5. No acute osseous pathology. Bilateral hip arthroplasties. IMPRESSION: 1. No acute intra-abdominal or pelvic pathology. 2. Colonic diverticulosis. No bowel obstruction. Normal appendix. 3. Broad-based right lateral hernia containing a segment of the colon. 4. Status post prior right nephrectomy. 5. Aortic Atherosclerosis (ICD10-I70.0). Electronically Signed   By: Anner Crete M.D.   On: 06/22/2020 23:34   DG Abd 1 View  Result Date: 06/21/2020 CLINICAL DATA:  Constipation EXAM: ABDOMEN - 1 VIEW COMPARISON:  02/08/2018 FINDINGS: Streaky left lung base opacity. Surgical clips in the right upper quadrant. Nonobstructed bowel-gas pattern with mild stool. Calcified fibroid in the pelvis IMPRESSION: Negative. Electronically Signed   By: Donavan Foil M.D.   On: 06/21/2020 18:40   NM Pulmonary Perf and Vent  Result Date: 06/22/2020 CLINICAL DATA:  Chest pain, shortness of breath EXAM: NUCLEAR MEDICINE PERFUSION LUNG SCAN TECHNIQUE: Perfusion images were obtained in multiple projections after intravenous injection of radiopharmaceutical. Ventilation scans intentionally deferred if perfusion scan and chest x-ray adequate for interpretation during COVID 19 epidemic. RADIOPHARMACEUTICALS:  4.2 mCi Tc-13m MAA IV COMPARISON:  Chest x-ray 06/21/2020 FINDINGS: Slightly heterogeneous accumulation of radiotracer throughout the bilateral lung fields. No segmental perfusion defects. Prominent area of central photopenia compatible with cardiomegaly. IMPRESSION: Low probability of pulmonary  embolism. Electronically Signed   By: Davina Poke D.O.   On: 06/22/2020 15:07     LOS: 6 days   Bonnell Public, MD [facility]  06/23/2020, 3:52 PM

## 2020-06-23 NOTE — Progress Notes (Signed)
Physical Therapy Treatment Patient Details Name: Tracy Johnston MRN: [id number] DOB: 06-22-1950 Today's Date: 06/23/2020    History of Present Illness Pt is a 70 y/o female s/p L TKA. She also has a historyu of a hernia which the MD's belive is causing some of her gflank pain.  PMH includes COPD, cervical cancer, a fib, HTN, tobbacco use, and bilateral THA.    PT Comments    Patient was able to get up and move out of bed but she was limited by Sao2 and HR. Her HR went up to 130 and her sao2 dropped to 84% while ambulating to the chair. Therapy reviewed exercises to perform in the chair. Therapy will continue to progress as tolerated. She ambulated 3' to the chair with min guard. She required cuing to turn.   Follow Up Recommendations  Follow surgeon's recommendation for DC plan and follow-up therapies;Supervision/Assistance - 24 hour     Equipment Recommendations  Rolling walker with 5" wheels    Recommendations for Other Services       Precautions / Restrictions Precautions Precautions: Fall;Knee Precaution Booklet Issued: No Precaution Comments: Verbally reviewed knee precautions with pt. Required Braces or Orthoses: Knee Immobilizer - Left Knee Immobilizer - Right: On when out of bed or walking Knee Immobilizer - Left: On when out of bed or walking Restrictions Weight Bearing Restrictions: Yes LLE Weight Bearing: Weight bearing as tolerated Other Position/Activity Restrictions: NO knee ROM, KI to be worn during ambulation.    Mobility  Bed Mobility Overal bed mobility: Needs Assistance Bed Mobility: Sit to Supine Rolling: Supervision   Supine to sit: Min assist     General bed mobility comments: min as to sit up and to scoot legs to the edof the bed.    Transfers Overall transfer level: Needs assistance Equipment used: Rolling walker (2 wheeled) Transfers: Sit to/from Stand Sit to Stand: Min assist         General transfer comment: min a for stability asnd  strength to stand. Increased pain in her right flank. Sao2 remained at 90 but HR increased to 118  Ambulation/Gait Ambulation/Gait assistance: Min guard Gait Distance (Feet): 3 Feet Assistive device: Rolling walker (2 wheeled) Gait Pattern/deviations: Step-to pattern;Antalgic;Trunk flexed;Decreased stride length;Decreased stance time - left Gait velocity: decreased   General Gait Details: slow step to pattern over to the chair. Cuing to turn towards the chair. Reported flank pain with gait. No significant knee pain.   Stairs             Wheelchair Mobility    Modified Rankin (Stroke Patients Only)       Balance Overall balance assessment: Needs assistance Sitting-balance support: Feet supported;Bilateral upper extremity supported Sitting balance-Leahy Scale: Fair     Standing balance support: Bilateral upper extremity supported;During functional activity Standing balance-Leahy Scale: Poor Standing balance comment: external support required                            Cognition Arousal/Alertness: Awake/alert Behavior During Therapy: Anxious;WFL for tasks assessed/performed Overall Cognitive Status: Within Functional Limits for tasks assessed                                 General Comments: remains self limiting due to pain.      Exercises Total Joint Exercises Ankle Circles/Pumps: AROM;Both;10 reps;Supine Quad Sets: AROM;Left;10 reps;Supine Gluteal Sets: 15 reps    General  Comments General comments (skin integrity, edema, etc.): Patient on 3L of NCo2. After ambualtion HR to 130 and sao2 to 84%      Pertinent Vitals/Pain Pain Assessment: 0-10 Faces Pain Scale: Hurts whole lot Pain Location: R side of chest under R breast ( pulling and tight)    Home Living                      Prior Function            PT Goals (current goals can now be found in the care plan section) Acute Rehab PT Goals Patient Stated Goal:  home PT Goal Formulation: With patient Time For Goal Achievement: 06/29/20 Potential to Achieve Goals: Fair Progress towards PT goals: Progressing toward goals    Frequency    7X/week      PT Plan Current plan remains appropriate    Co-evaluation              AM-PAC PT "6 Clicks" Mobility   Outcome Measure  Help needed turning from your back to your side while in a flat bed without using bedrails?: A Little Help needed moving from lying on your back to sitting on the side of a flat bed without using bedrails?: A Little Help needed moving to and from a bed to a chair (including a wheelchair)?: A Lot Help needed standing up from a chair using your arms (e.g., wheelchair or bedside chair)?: A Lot Help needed to walk in hospital room?: A Lot Help needed climbing 3-5 steps with a railing? : A Lot 6 Click Score: 14    End of Session Equipment Utilized During Treatment: Gait belt;Left knee immobilizer Activity Tolerance: Patient limited by fatigue;Patient limited by pain Patient left: with call bell/phone within reach;in chair Nurse Communication: Mobility status PT Visit Diagnosis: Unsteadiness on feet (R26.81);Difficulty in walking, not elsewhere classified (R26.2);Pain;Muscle weakness (generalized) (M62.81) Pain - Right/Left: Left Pain - part of body: Knee     Time: 5993-5701 PT Time Calculation (min) (ACUTE ONLY): 21 min  Charges:  $Therapeutic Activity: 8-22 mins                        Carney Living PT DPT  06/23/2020, 11:36 AM

## 2020-06-23 NOTE — Progress Notes (Signed)
Fleet enema given, tolerated well. Minimally successful with a small mushy BM.

## 2020-06-23 NOTE — Progress Notes (Addendum)
     Subjective: 8 Days Post-Op Procedure(s) (LRB): LEFT TOTAL KNEE ARTHROPLASTY (Left) Awake, alert and complaining of pain. She is experiencing pain into the right side chest. Her Left leg is NV intact. CT of abdomen with right lower lobe lung infiltrate concerning for pneumonia. Patient reports pain as moderate.    Objective:   VITALS:  Temp:  [97.5 F (36.4 C)-98.3 F (36.8 C)] 97.8 F (36.6 C) (05/28 0803) Pulse Rate:  [77-96] 82 (05/28 0803) Resp:  [14-17] 17 (05/28 0803) BP: (102-136)/(62-78) 136/72 (05/28 0803) SpO2:  [83 %-95 %] 92 % (05/28 0803)  Neurologically intact ABD soft Neurovascular intact Sensation intact distally Intact pulses distally Dorsiflexion/Plantar flexion intact Incision: dressing C/D/I and no drainage   LABS Recent Labs    06/21/20 1629 06/22/20 1330 06/23/20 0115  HGB 12.7 11.7* 11.9*  WBC 17.9* 20.0* 19.4*  PLT 257 278 288   Recent Labs    06/22/20 1330 06/23/20 0115  NA 139 141  K 3.3* 4.0  CL 102 105  CO2 29 24  BUN 18 17  CREATININE 1.15* 1.10*  GLUCOSE 112* 97   No results for input(s): LABPT, INR in the last 72 hours.   Assessment/Plan: 8 Days Post-Op Procedure(s) (LRB): LEFT TOTAL KNEE ARTHROPLASTY (Left) Right chest pain?   Advance diet Up with therapy  Not ready for Discharge, she is painful and experiencing right chest discomfort.  Tracy Johnston 06/23/2020, 9:19 AMPatient ID: Tracy Johnston, female   DOB: 12-18-1950, 70 y.o.   MRN: [id number]

## 2020-06-24 ENCOUNTER — Inpatient Hospital Stay (HOSPITAL_COMMUNITY): Payer: Medicare HMO

## 2020-06-24 DIAGNOSIS — Z96652 Presence of left artificial knee joint: Secondary | ICD-10-CM | POA: Diagnosis not present

## 2020-06-24 DIAGNOSIS — D72829 Elevated white blood cell count, unspecified: Secondary | ICD-10-CM

## 2020-06-24 DIAGNOSIS — R1011 Right upper quadrant pain: Secondary | ICD-10-CM

## 2020-06-24 LAB — CBC WITH DIFFERENTIAL/PLATELET
Abs Immature Granulocytes: 0.31 10*3/uL — ABNORMAL HIGH (ref 0.00–0.07)
Basophils Absolute: 0.1 10*3/uL (ref 0.0–0.1)
Basophils Relative: 0 %
Eosinophils Absolute: 0 10*3/uL (ref 0.0–0.5)
Eosinophils Relative: 0 %
HCT: 37.7 % (ref 36.0–46.0)
Hemoglobin: 13.1 g/dL (ref 12.0–15.0)
Immature Granulocytes: 1 %
Lymphocytes Relative: 3 %
Lymphs Abs: 0.7 10*3/uL (ref 0.7–4.0)
MCH: 31.3 pg (ref 26.0–34.0)
MCHC: 34.7 g/dL (ref 30.0–36.0)
MCV: 90 fL (ref 80.0–100.0)
Monocytes Absolute: 0.7 10*3/uL (ref 0.1–1.0)
Monocytes Relative: 3 %
Neutro Abs: 24.3 10*3/uL — ABNORMAL HIGH (ref 1.7–7.7)
Neutrophils Relative %: 93 %
Platelets: 407 10*3/uL — ABNORMAL HIGH (ref 150–400)
RBC: 4.19 MIL/uL (ref 3.87–5.11)
RDW: 13.8 % (ref 11.5–15.5)
WBC: 26.1 10*3/uL — ABNORMAL HIGH (ref 4.0–10.5)
nRBC: 0.2 % (ref 0.0–0.2)

## 2020-06-24 LAB — MAGNESIUM: Magnesium: 2.2 mg/dL (ref 1.7–2.4)

## 2020-06-24 LAB — COMPREHENSIVE METABOLIC PANEL
ALT: 20 U/L (ref 0–44)
AST: 25 U/L (ref 15–41)
Albumin: 2.4 g/dL — ABNORMAL LOW (ref 3.5–5.0)
Alkaline Phosphatase: 104 U/L (ref 38–126)
Anion gap: 13 (ref 5–15)
BUN: 20 mg/dL (ref 8–23)
CO2: 28 mmol/L (ref 22–32)
Calcium: 9.7 mg/dL (ref 8.9–10.3)
Chloride: 98 mmol/L (ref 98–111)
Creatinine, Ser: 1.36 mg/dL — ABNORMAL HIGH (ref 0.44–1.00)
GFR, Estimated: 42 mL/min — ABNORMAL LOW (ref >60)
Glucose, Bld: 112 mg/dL — ABNORMAL HIGH (ref 70–99)
Potassium: 3.8 mmol/L (ref 3.5–5.1)
Sodium: 139 mmol/L (ref 135–145)
Total Bilirubin: 1.1 mg/dL (ref 0.3–1.2)
Total Protein: 7.5 g/dL (ref 6.5–8.1)

## 2020-06-24 LAB — LACTIC ACID, PLASMA
Lactic Acid, Venous: 2.8 mmol/L (ref 0.5–1.9)
Lactic Acid, Venous: 1.6 mmol/L (ref 0.5–1.9)

## 2020-06-24 LAB — IRON AND TIBC
Iron: 35 ug/dL (ref 28–170)
Saturation Ratios: 14 % (ref 10.4–31.8)
TIBC: 252 ug/dL (ref 250–450)
UIBC: 217 ug/dL

## 2020-06-24 LAB — PROCALCITONIN: Procalcitonin: 0.16 ng/mL

## 2020-06-24 MED ORDER — POLYETHYLENE GLYCOL 3350 17 G PO PACK
17.0000 g | PACK | Freq: Every day | ORAL | Status: DC
  Administered 2020-06-25 – 2020-06-28 (×2): 17 g via ORAL
  Filled 2020-06-24 (×4): qty 1

## 2020-06-24 NOTE — Progress Notes (Signed)
PROGRESS NOTE    Tracy Johnston  BMW:[id number] DOB: 12-01-1950 DOA: 06/15/2020 PCP: Leeanne Rio, MD   Brief Narrative: 70 year old female with history of COPD, hypertension, A. fib, asthma and gout and other comorbidities who was admitted under orthopedic surgery underwent right total left knee replacement and was waiting for discharge to skilled nursing facility once he started to have pain on the right side of the chest and shortness of breath and medicine team was consulted reparations was called.  Patient was evaluated by hospitalist team and underwent extensive work-up including troponin and serial there were negative procalcitonin negative had leukocytosis, D-dimers were elevated in 3, VQ scan was done and CT abdomen pelvis also ordered due to concern for hernia and pain on the right side.  06/23/2020: Patient seen.  Patient continues to report right lateral chest wall pain.  Patient was also noted to be on prednisone as needed.  Will change to prednisone 40 Mg p.o. once daily.  We will continue nebulizer treatment.  We will continue treatment for possible pneumonia.  Will place lidocaine patch to right lateral chest wall area.  06/24/2020: Patient seen.  Discussed with the orthopedic surgeon, Dr. Louanne Skye, extensively.  Orthopedic surgery team was concerned about possible gallbladder pathology.  Will order right upper quadrant ultrasound.  We will repeat CBC, CMP, procalcitonin and lactic acid level.  Discussed case with the surgery team, Dr. Alvino Blood.  Right-sided abdominal/right lateral chest wall pain persists.  Subjective: Patient reports right lateral chest wall pain.  Assessment & Plan:  Left knee osteoarthritis status post left knee replacement continue plan of care DVT prophylaxis PT OT pain control as per primary orthopedic surgery team.  Right chest pain/right lateral abdominal pain/Rapid Response at time of discharge 5/26:evaluated by hospitalist team and underwent extensive  work-up including troponin and serial there were negative procalcitonin negative had leukocytosis, D-dimers were elevated in 3, VQ scan was done and CT abdomen pelvis also ordered due to concern for hernia and pain on the right side.  Low suspicion of PE, patient already anticoagulated.  She is afebrile but has leukocytosis has been empirically placed on antibiotics but unclear infection at this time.  Duplex of the liver negative for DVT.  I will order lipase, amylase and CMP thsi am. follow-up CT abdomen pelvis, there is concern for hernia at the site of previous right kidney surgery, follow-up VQ scan, symptomatic management. 06/23/2020: CT scan of the abdomen and pelvis done on 06/22/2020 revealed:  1. No acute intra-abdominal or pelvic pathology. 2. Colonic diverticulosis. No bowel obstruction. Normal appendix. 3. Broad-based right lateral hernia containing a segment of the colon. 4. Status post prior right nephrectomy. 5. Aortic Atherosclerosis (ICD10-I70.0).  Will apply lidocaine patch to the right lateral chest area.  06/24/2020: See above documentation.  For right upper quadrant ultrasound.  We will also check lactic acid, CBC, CMP and procalcitonin.  Continue antibiotics.  Surgery team has been consulted.  COPD: -Continue nebs treatment. -Start prednisone 40 Mg p.o. once daily.   Leukocytosis unclear etiology, has been running on higher side with slight worsening yesterday. In the setting of postop status, Pro-Cal negative reassuring repeat lab empirically placed on ceftriaxone azithromycin yesterday. 06/23/2020: Continue to monitor WBC. Recent Labs  Lab 06/18/20 0254 06/21/20 1629 06/21/20 1743 06/22/20 1330 06/23/20 0115 06/24/20 1358  WBC 16.0* 17.9*  --  20.0* 19.4* 26.1*  LATICACIDVEN  --   --   --   --   --  2.8*  PROCALCITON  --   --  <  0.10  --   --  0.16   Hypertension: -Continue to monitor closely.    HLD on simvastatin.  A. fib on anticoagulation with Xarelto, rate  controlled on Cardizem.  Gout: On allopurinol  Morbid obesity BMI 35 will benefit with weight loss.  Outpatient GI follow-up and healthy lifestyle and diet.  Diet Order            Diet regular Room service appropriate? Yes with Assist; Fluid consistency: Thin  Diet effective now               Patient's Body mass index is 35.19 kg/m.  DVT prophylaxis: SCDs Start: 06/15/20 1222 Xarelto Code Status:   Code Status: Full Code  Family Communication: plan of care discussed with patient at bedside.  Status is: Inpatient Remains inpatient appropriate because:Inpatient level of care appropriate due to severity of illness  Dispo: The patient is from: Home              Anticipated d/c is to: SNF              Patient currently is not medically stable to d/c.   Difficult to place patient No Unresulted Labs (From admission, onward)          Start     Ordered   06/25/20 0500  CBC with Differential/Platelet  Tomorrow morning,   R       Question:  Specimen collection method  Answer:  Lab=Lab collect   06/24/20 1031   06/25/20 0500  Renal function panel  Tomorrow morning,   R       Question:  Specimen collection method  Answer:  Lab=Lab collect   06/24/20 1031   06/24/20 1330  Lactic acid, plasma  STAT Now then every 3 hours,   R (with STAT occurrences)     Question:  Specimen collection method  Answer:  Lab=Lab collect   06/24/20 1329   06/21/20 1853  Urinalysis, Routine w reflex microscopic Urine, Unspecified Source  Once,   R        06/21/20 1857   Signed and Held  Surgical pcr screen  Once,   R        Signed and Held          Medications reviewed:  Scheduled Meds: . allopurinol  300 mg Oral Daily  . diltiazem  300 mg Oral Daily  . docusate sodium  100 mg Oral BID  . ferrous sulfate  325 mg Oral TID PC  . fluticasone furoate-vilanterol  1 puff Inhalation Daily  . hydrochlorothiazide  12.5 mg Oral Daily  . irbesartan  300 mg Oral Daily  . lidocaine  1 patch Transdermal Q24H   . mirabegron ER  50 mg Oral Daily  . montelukast  10 mg Oral QHS  . polyethylene glycol  17 g Oral QODAY  . predniSONE  40 mg Oral Q breakfast  . rivaroxaban  20 mg Oral Q supper  . simvastatin  20 mg Oral Daily  . umeclidinium bromide  1 puff Inhalation Daily   Continuous Infusions: . sodium chloride Stopped (06/16/20 1200)  . cefTRIAXone (ROCEPHIN)  IV Stopped (06/24/20 0724)    Consultants:see note  Procedures:see note  Antimicrobials: Anti-infectives (From admission, onward)   Start     Dose/Rate Route Frequency Ordered Stop   06/21/20 2000  cefTRIAXone (ROCEPHIN) 2 g in sodium chloride 0.9 % 100 mL IVPB        2 g 200 mL/hr over 30 Minutes  Intravenous Every 24 hours 06/21/20 1857 06/26/20 1959   06/21/20 2000  azithromycin (ZITHROMAX) tablet 500 mg        500 mg Oral Daily 06/21/20 1857 06/23/20 0939   06/15/20 0600  ceFAZolin (ANCEF) IVPB 2g/100 mL premix        2 g 200 mL/hr over 30 Minutes Intravenous On call to O.R. 06/15/20 0549 06/15/20 0746     Culture/Microbiology No results found for: SDES, SPECREQUEST, CULT, REPTSTATUS  Other culture-see note  Objective: Vitals: Today's Vitals   06/24/20 0800 06/24/20 0934 06/24/20 0939 06/24/20 1547  BP:   (!) 123/94 135/82  Pulse:   91 (!) 103  Resp: 20  18   Temp:   97.9 F (36.6 C) 98.4 F (36.9 C)  TempSrc:    Oral  SpO2:   90% 90%  Weight:      Height:      PainSc: 9  6       Intake/Output Summary (Last 24 hours) at 06/24/2020 1732 Last data filed at 06/24/2020 0200 Gross per 24 hour  Intake 460 ml  Output 230 ml  Net 230 ml   Filed Weights   06/15/20 0559  Weight: 93 kg   Weight change:   Intake/Output from previous day: 05/28 0701 - 05/29 0700 In: 560 [P.O.:360; IV Piggyback:200] Out: 230 [Urine:230] Intake/Output this shift: No intake/output data recorded. Filed Weights   06/15/20 0559  Weight: 93 kg    Examination: General exam: AAO x3 , older than stated age. HEENT:Oral mucosa moist,  Ear/Nose WNL grossly,dentition normal. Respiratory system: bilaterally diminished Cardiovascular system: S1 & S2  Gastrointestinal system: Abdomen soft, swollen reducible mass in the right abdomen tender, bowel sound present  Nervous System:Alert, awake, moving extremities Extremities: Left lower extremity edema is improving.    Data Reviewed: I have personally reviewed following labs and imaging studies CBC: Recent Labs  Lab 06/18/20 0254 06/21/20 1629 06/22/20 1330 06/23/20 0115 06/24/20 1358  WBC 16.0* 17.9* 20.0* 19.4* 26.1*  NEUTROABS  --  16.3*  --   --  24.3*  HGB 13.7 12.7 11.7* 11.9* 13.1  HCT 38.5 36.1 34.1* 33.9* 37.7  MCV 88.5 88.5 89.7 89.2 90.0  PLT 169 257 278 288 062*   Basic Metabolic Panel: Recent Labs  Lab 06/22/20 1330 06/23/20 0115 06/24/20 1358  NA 139 141 139  K 3.3* 4.0 3.8  CL 102 105 98  CO2 29 24 28   GLUCOSE 112* 97 112*  BUN 18 17 20   CREATININE 1.15* 1.10* 1.36*  CALCIUM 9.0 9.2 9.7  MG  --   --  2.2   GFR: Estimated Creatinine Clearance: 41.9 mL/min (A) (by C-G formula based on SCr of 1.36 mg/dL (H)). Liver Function Tests: Recent Labs  Lab 06/22/20 1330 06/23/20 0115 06/24/20 1358  AST 19 16 25   ALT 13 15 20   ALKPHOS 72 75 104  BILITOT 1.1 0.8 1.1  PROT 6.2* 6.1* 7.5  ALBUMIN 2.2* 2.2* 2.4*   Recent Labs  Lab 06/22/20 1330  LIPASE 22  AMYLASE 58   No results for input(s): AMMONIA in the last 168 hours. Coagulation Profile: No results for input(s): INR, PROTIME in the last 168 hours. Cardiac Enzymes: No results for input(s): CKTOTAL, CKMB, CKMBINDEX, TROPONINI in the last 168 hours. BNP (last 3 results) No results for input(s): PROBNP in the last 8760 hours. HbA1C: No results for input(s): HGBA1C in the last 72 hours. CBG: No results for input(s): GLUCAP in the last 168 hours.  Lipid Profile: No results for input(s): CHOL, HDL, LDLCALC, TRIG, CHOLHDL, LDLDIRECT in the last 72 hours. Thyroid Function Tests: No  results for input(s): TSH, T4TOTAL, FREET4, T3FREE, THYROIDAB in the last 72 hours. Anemia Panel: Recent Labs    06/21/20 1743 06/24/20 1358  TIBC  --  252  IRON  --  35  RETICCTPCT 1.8  --    Sepsis Labs: Recent Labs  Lab 06/21/20 1743 06/24/20 1358  PROCALCITON <0.10 0.16  LATICACIDVEN  --  2.8*    Recent Results (from the past 240 hour(s))  SARS CORONAVIRUS 2 (TAT 6-24 HRS) Nasopharyngeal Nasopharyngeal Swab     Status: None   Collection Time: 06/20/20  1:34 PM   Specimen: Nasopharyngeal Swab  Result Value Ref Range Status   SARS Coronavirus 2 NEGATIVE NEGATIVE Final    Comment: (NOTE) SARS-CoV-2 target nucleic acids are NOT DETECTED.  The SARS-CoV-2 RNA is generally detectable in upper and lower respiratory specimens during the acute phase of infection. Negative results do not preclude SARS-CoV-2 infection, do not rule out co-infections with other pathogens, and should not be used as the sole basis for treatment or other patient management decisions. Negative results must be combined with clinical observations, patient history, and epidemiological information. The expected result is Negative.  Fact Sheet for Patients: SugarRoll.be  Fact Sheet for Healthcare Providers: https://www.woods-mathews.com/  This test is not yet approved or cleared by the [place] FDA and  has been authorized for detection and/or diagnosis of SARS-CoV-2 by FDA under an Emergency Use Authorization (EUA). This EUA will remain  in effect (meaning this test can be used) for the duration of the COVID-19 declaration under Se ction 564(b)(1) of the Act, 21 U.S.C. section 360bbb-3(b)(1), unless the authorization is terminated or revoked sooner.  Performed at [facility], [place] [address] [place], [place] [address]      Radiology Studies: CT ABDOMEN PELVIS WO CONTRAST  Result Date: 06/22/2020 CLINICAL DATA:  70 year old female with  abdominal pain. Concern for hernia. EXAM: CT ABDOMEN AND PELVIS WITHOUT CONTRAST TECHNIQUE: Multidetector CT imaging of the abdomen and pelvis was performed following the standard protocol without IV contrast. COMPARISON:  Abdominal radiograph dated 06/21/2020. CT abdomen pelvis dated 09/13/2012. FINDINGS: Evaluation of this exam is limited in the absence of intravenous contrast as well as due to respiratory motion artifact. Lower chest: Patchy area of ground-glass and streaky density at the right lung base concerning for pneumonia. Clinical correlation is recommended. There is mild cardiomegaly. No intra-abdominal free air or free fluid. Hepatobiliary: The liver is unremarkable. No intrahepatic biliary ductal dilatation. No calcified gallstone or pericholecystic fluid. Pancreas: Unremarkable. No pancreatic ductal dilatation or surrounding inflammatory changes. Spleen: Normal in size without focal abnormality. Adrenals/Urinary Tract: The adrenal glands are unremarkable. Status post prior right nephrectomy. There is no hydronephrosis or nephrolithiasis on the left. The left ureter and urinary bladder appear unremarkable. Stomach/Bowel: There is sigmoid diverticulosis and diffuse colonic diverticula without active inflammatory changes. There is no bowel obstruction or active inflammation. The appendix is normal. Vascular/Lymphatic: Advanced aortoiliac atherosclerotic disease. The IVC is unremarkable. No portal venous gas. There is no adenopathy. Reproductive: The uterus is anteverted. There is a 2 cm calcified fibroid. No adnexal masses. Other: There is a broad-based right lateral hernia containing segment of the colon. Musculoskeletal: Osteopenia with degenerative changes of the spine. The disc a shin and vacuum phenomena at L4-L5. No acute osseous pathology. Bilateral hip arthroplasties. IMPRESSION: 1. No acute intra-abdominal or pelvic pathology. 2.  Colonic diverticulosis. No bowel obstruction. Normal appendix. 3.  Broad-based right lateral hernia containing a segment of the colon. 4. Status post prior right nephrectomy. 5. Aortic Atherosclerosis (ICD10-I70.0). Electronically Signed   By: Anner Crete M.D.   On: 06/22/2020 23:34     LOS: 7 days   Bonnell Public, MD [facility]  06/24/2020, 5:32 PM

## 2020-06-24 NOTE — Progress Notes (Addendum)
PT Cancellation Note  Patient Details Name: Tracy Johnston MRN: [id number] DOB: May 06, 1950   Cancelled Treatment:    Reason Eval/Treat Not Completed: Pain limiting ability to participate.  Pt reports she is in too much pain to participate in PT.  HR 110 and O2 Sats 86 while in room.  Encouraged patient to take a few deep breaths to increase o2.  Attempted x 2 (10 am and 1:30 pm)   Shanna Cisco 06/24/2020, 10:24 AM

## 2020-06-24 NOTE — Consult Note (Signed)
Reason for Consult: hernia Referring Physician: Dimples Probus is an 70 y.o. female.  HPI: 70 yo female who was admitted after knee replacement. She has been having right sided chest pain for the past few days. She is being treated for pneumonia but continues to have a rising white count. CT scan 5/27 showed dilated gallbladder and large hernia. She complains of pain at the superior portion of the hernia. It is sharp. Pain is worse with movement. It does not radiate. Pain medication helps some. She has been able to eat without pain but does note poor appetite. She had a bowel movement yesterday.  Past Medical History:  Diagnosis Date  . Arthritis   . Asthma   . Atrial fibrillation (HCC)    chronic. Was first diagnosed in her early 60s.  . Cancer ([facility]) 2003ish   , cervix  . COPD (chronic obstructive pulmonary disease) ([facility])   . Dysrhythmia   . Heart murmur   . Hypertension   . Insomnia   . Malignant neoplasm of kidney ([place])    rt removed 2003  . Mitral regurgitation    Mild to moderate. Normal EF 09/2010  . Renal insufficiency   . Sickle cell anemia (HCC)   . Tobacco abuse     Past Surgical History:  Procedure Laterality Date  . ABDOMINAL HYSTERECTOMY    . arm fracture     rods, pins and bone graft.- to left arm from hip  . BONE GRAFT HIP ILIAC CREST     to left arm  . CERVICAL CONIZATION W/BX    . NEPHRECTOMY  2003  . TONSILLECTOMY    . TOTAL HIP ARTHROPLASTY  10/17/2011   Procedure: TOTAL HIP ARTHROPLASTY;  Surgeon: [name], MD;  Location: [facility];  Service: Orthopedics;  Laterality: Left;  Left Total Hip Arthroplasty  . TOTAL HIP ARTHROPLASTY  01/02/2012   Procedure: TOTAL HIP ARTHROPLASTY;  Surgeon: [name], MD;  Location: [facility];  Service: Orthopedics;  Laterality: Right;  Right Total Hip Arthroplasty  . TOTAL KNEE ARTHROPLASTY Left 06/15/2020   Procedure: LEFT TOTAL KNEE ARTHROPLASTY;  Surgeon: [name], MD;  Location: [facility];  Service:  Orthopedics;  Laterality: Left;  Needs RNFA  . TUBAL LIGATION      Family History  Problem Relation Age of Onset  . Diabetes Mother   . Cancer Mother        ovarian    Social History:  reports that she quit smoking about 2 years ago. Her smoking use included cigarettes. She started smoking about 36 years ago. She has a 40.00 pack-year smoking history. She has never used smokeless tobacco. She reports current alcohol use. She reports that she does not use drugs.  Allergies:  Allergies  Allergen Reactions  . Bupropion Hives  . Aspirin Other (See Comments)    Bleeding at higher doses  . Latex Rash    When she wears gloves    Medications: I have reviewed the patient's current medications.  Results for orders placed or performed during the hospital encounter of 06/15/20 (from the past 48 hour(s))  Comprehensive metabolic panel     Status: Abnormal   Collection Time: 06/23/20  1:15 AM  Result Value Ref Range   Sodium 141 135 - 145 mmol/L   Potassium 4.0 3.5 - 5.1 mmol/L    Comment: DELTA CHECK NOTED NO VISIBLE HEMOLYSIS    Chloride 105 98 - 111 mmol/L   CO2 24 22 -  32 mmol/L   Glucose, Bld 97 70 - 99 mg/dL    Comment: Glucose reference range applies only to samples taken after fasting for at least 8 hours.   BUN 17 8 - 23 mg/dL   Creatinine, Ser 1.10 (H) 0.44 - 1.00 mg/dL   Calcium 9.2 8.9 - 10.3 mg/dL   Total Protein 6.1 (L) 6.5 - 8.1 g/dL   Albumin 2.2 (L) 3.5 - 5.0 g/dL   AST 16 15 - 41 U/L   ALT 15 0 - 44 U/L   Alkaline Phosphatase 75 38 - 126 U/L   Total Bilirubin 0.8 0.3 - 1.2 mg/dL   GFR, Estimated 54 (L) >60 mL/min    Comment: (NOTE) Calculated using the CKD-EPI Creatinine Equation (2021)    Anion gap 12 5 - 15    Comment: Performed at [facility], [place] [address], [place], [place] [address]  CBC     Status: Abnormal   Collection Time: 06/23/20  1:15 AM  Result Value Ref Range   WBC 19.4 (H) 4.0 - 10.5 K/uL   RBC 3.80 (L) 3.87 - 5.11 MIL/uL    Hemoglobin 11.9 (L) 12.0 - 15.0 g/dL   HCT 33.9 (L) 36.0 - 46.0 %   MCV 89.2 80.0 - 100.0 fL   MCH 31.3 26.0 - 34.0 pg   MCHC 35.1 30.0 - 36.0 g/dL   RDW 13.8 11.5 - 15.5 %   Platelets 288 150 - 400 K/uL   nRBC 0.0 0.0 - 0.2 %    Comment: Performed at [facility], [place] [address] [place], [place] [address]  Comprehensive metabolic panel     Status: Abnormal   Collection Time: 06/24/20  1:58 PM  Result Value Ref Range   Sodium 139 135 - 145 mmol/L   Potassium 3.8 3.5 - 5.1 mmol/L   Chloride 98 98 - 111 mmol/L   CO2 28 22 - 32 mmol/L   Glucose, Bld 112 (H) 70 - 99 mg/dL    Comment: Glucose reference range applies only to samples taken after fasting for at least 8 hours.   BUN 20 8 - 23 mg/dL   Creatinine, Ser 1.36 (H) 0.44 - 1.00 mg/dL   Calcium 9.7 8.9 - 10.3 mg/dL   Total Protein 7.5 6.5 - 8.1 g/dL   Albumin 2.4 (L) 3.5 - 5.0 g/dL   AST 25 15 - 41 U/L   ALT 20 0 - 44 U/L   Alkaline Phosphatase 104 38 - 126 U/L   Total Bilirubin 1.1 0.3 - 1.2 mg/dL   GFR, Estimated 42 (L) >60 mL/min    Comment: (NOTE) Calculated using the CKD-EPI Creatinine Equation (2021)    Anion gap 13 5 - 15    Comment: Performed at [facility] [address] [place], [place] [address]  Magnesium     Status: None   Collection Time: 06/24/20  1:58 PM  Result Value Ref Range   Magnesium 2.2 1.7 - 2.4 mg/dL    Comment: Performed at [facility] [address], [place],  [address]  CBC with Differential/Platelet     Status: Abnormal   Collection Time: 06/24/20  1:58 PM  Result Value Ref Range   WBC 26.1 (H) 4.0 - 10.5 K/uL   RBC 4.19 3.87 - 5.11 MIL/uL   Hemoglobin 13.1 12.0 - 15.0 g/dL   HCT 37.7 36.0 - 46.0 %   MCV 90.0 80.0 - 100.0 fL   MCH 31.3  26.0 - 34.0 pg   MCHC 34.7 30.0 - 36.0 g/dL   RDW 13.8 11.5 - 15.5 %   Platelets 407 (H) 150 - 400 K/uL   nRBC 0.2 0.0 - 0.2 %   Neutrophils Relative % 93 %   Neutro Abs 24.3 (H) 1.7 - 7.7 K/uL   Lymphocytes Relative 3 %    Lymphs Abs 0.7 0.7 - 4.0 K/uL   Monocytes Relative 3 %   Monocytes Absolute 0.7 0.1 - 1.0 K/uL   Eosinophils Relative 0 %   Eosinophils Absolute 0.0 0.0 - 0.5 K/uL   Basophils Relative 0 %   Basophils Absolute 0.1 0.0 - 0.1 K/uL   Immature Granulocytes 1 %   Abs Immature Granulocytes 0.31 (H) 0.00 - 0.07 K/uL    Comment: Performed at [facility] [address] [place],  [address]  Procalcitonin - Baseline     Status: None   Collection Time: 06/24/20  1:58 PM  Result Value Ref Range   Procalcitonin 0.16 ng/mL    Comment:        Interpretation: PCT (Procalcitonin) <= 0.5 ng/mL: Systemic infection (sepsis) is not likely. Local bacterial infection is possible. (NOTE)       Sepsis PCT Algorithm           Lower Respiratory Tract                                      Infection PCT Algorithm    ----------------------------     ----------------------------         PCT < 0.25 ng/mL                PCT < 0.10 ng/mL          Strongly encourage             Strongly discourage   discontinuation of antibiotics    initiation of antibiotics    ----------------------------     -----------------------------       PCT 0.25 - 0.50 ng/mL            PCT 0.10 - 0.25 ng/mL               OR       >80% decrease in PCT            Discourage initiation of                                            antibiotics      Encourage discontinuation           of antibiotics    ----------------------------     -----------------------------         PCT >= 0.50 ng/mL              PCT 0.26 - 0.50 ng/mL               AND        <80% decrease in PCT             Encourage initiation of  antibiotics       Encourage continuation           of antibiotics    ----------------------------     -----------------------------        PCT >= 0.50 ng/mL                  PCT > 0.50 ng/mL               AND         increase in PCT                  Strongly encourage                                       initiation of antibiotics    Strongly encourage escalation           of antibiotics                                     -----------------------------                                           PCT <= 0.25 ng/mL                                                 OR                                        > 80% decrease in PCT                                      Discontinue / Do not initiate                                             antibiotics  Performed at [facility], [address] [place], [place] [address]   Lactic acid, plasma     Status: Abnormal   Collection Time: 06/24/20  1:58 PM  Result Value Ref Range   Lactic Acid, Venous 2.8 (HH) 0.5 - 1.9 mmol/L    Comment: CRITICAL RESULT CALLED TO, READ BACK BY AND VERIFIED WITH: LIZ LARSON RN.@[email or web address]  ON 5.29.22 BY TCALDWELL MT. Performed at [facility], [place] [address], [place], [place] [address]   Iron and TIBC     Status: None   Collection Time: 06/24/20  1:58 PM  Result Value Ref Range   Iron 35 28 - 170 ug/dL   TIBC 252 250 - 450 ug/dL   Saturation Ratios 14 10.4 - 31.8 %   UIBC 217 ug/dL    Comment: Performed at [facility], [place] [address] [place], [place] [address]  Lactic acid, plasma     Status: None   Collection Time: 06/24/20  4:43 PM  Result Value Ref Range   Lactic Acid, Venous 1.6 0.5 - 1.9 mmol/L    Comment: Performed at [facility] [address], [place],  [address]    CT ABDOMEN PELVIS WO CONTRAST  Result Date: 06/22/2020 CLINICAL DATA:  70 year old female with abdominal pain. Concern for hernia. EXAM: CT ABDOMEN AND PELVIS WITHOUT CONTRAST TECHNIQUE: Multidetector CT imaging of the abdomen and pelvis was performed following the standard protocol without IV contrast. COMPARISON:  Abdominal radiograph dated 06/21/2020. CT abdomen pelvis dated 09/13/2012. FINDINGS: Evaluation of this exam is limited in the absence of intravenous contrast as well as due to  respiratory motion artifact. Lower chest: Patchy area of ground-glass and streaky density at the right lung base concerning for pneumonia. Clinical correlation is recommended. There is mild cardiomegaly. No intra-abdominal free air or free fluid. Hepatobiliary: The liver is unremarkable. No intrahepatic biliary ductal dilatation. No calcified gallstone or pericholecystic fluid. Pancreas: Unremarkable. No pancreatic ductal dilatation or surrounding inflammatory changes. Spleen: Normal in size without focal abnormality. Adrenals/Urinary Tract: The adrenal glands are unremarkable. Status post prior right nephrectomy. There is no hydronephrosis or nephrolithiasis on the left. The left ureter and urinary bladder appear unremarkable. Stomach/Bowel: There is sigmoid diverticulosis and diffuse colonic diverticula without active inflammatory changes. There is no bowel obstruction or active inflammation. The appendix is normal. Vascular/Lymphatic: Advanced aortoiliac atherosclerotic disease. The IVC is unremarkable. No portal venous gas. There is no adenopathy. Reproductive: The uterus is anteverted. There is a 2 cm calcified fibroid. No adnexal masses. Other: There is a broad-based right lateral hernia containing segment of the colon. Musculoskeletal: Osteopenia with degenerative changes of the spine. The disc a shin and vacuum phenomena at L4-L5. No acute osseous pathology. Bilateral hip arthroplasties. IMPRESSION: 1. No acute intra-abdominal or pelvic pathology. 2. Colonic diverticulosis. No bowel obstruction. Normal appendix. 3. Broad-based right lateral hernia containing a segment of the colon. 4. Status post prior right nephrectomy. 5. Aortic Atherosclerosis (ICD10-I70.0). Electronically Signed   By: Anner Crete M.D.   On: 06/22/2020 23:34   US Abdomen Limited RUQ (LIVER/GB)  Result Date: 06/24/2020 CLINICAL DATA:  RIGHT upper quadrant pain EXAM: ULTRASOUND ABDOMEN LIMITED RIGHT UPPER QUADRANT COMPARISON:   06/21/2020 FINDINGS: Gallbladder: There is biliary sludge. Gallbladder wall thickness is normal. No pericholecystic fluid. No sonographic Murphy sign noted by sonographer. Common bile duct: Diameter: 6 mm, normal Liver: No focal lesion identified. Within normal limits in parenchymal echogenicity. Portal vein is patent on color Doppler imaging with normal direction of blood flow towards the liver. Other: Status post RIGHT nephrectomy. IMPRESSION: No sonographic etiology for RIGHT upper quadrant pain identified. There is biliary sludge without evidence of acute cholecystitis. Electronically Signed   By: Valentino Saxon MD   On: 06/24/2020 17:48    Review of Systems  Cardiovascular: Positive for chest pain.  Gastrointestinal: Positive for abdominal pain.  Musculoskeletal: Positive for joint pain.  All other systems reviewed and are negative.   PE Blood pressure 135/82, pulse (!) 103, temperature 98.4 F (36.9 C), temperature source Oral, resp. rate 18, height 5\' 4"  (1.626 m), weight 93 kg, SpO2 90 %. Constitutional: NAD; conversant; no deformities Eyes: Moist conjunctiva; no lid lag; anicteric; PERRL Neck: Trachea midline; no thyromegaly Lungs: Normal respiratory effort; no tactile fremitus CV: RRR; no palpable thrills; no pitting edema GI: Abd large right lateral hernia, soft and easily reducible, pain at the lateral right subcostal margin; no palpable hepatosplenomegaly MSK: unable to assess gait; no clubbing/cyanosis  Psychiatric: Appropriate affect; alert and oriented x3 Lymphatic: No palpable cervical or axillary lymphadenopathy Skin: No major subcutaneous nodules. Warm and dry   Assessment/Plan: 70 yo female s/p knee replacement. She has a large hernia from right nephrectomy. It is not incarcerated. [place] reviewed showing no signs of acute cholecystitis. -recommend binder to help mobilize with hernia -increase miralax to daily -would not recommend gallbladder removal during  hospitalization -can follow up with [place] as outpatient to discuss hernia repair  Arta Bruce Kenady Doxtater 06/24/2020, 8:12 PM

## 2020-06-24 NOTE — Progress Notes (Signed)
     Subjective: 9 Days Post-Op Procedure(s) (LRB): LEFT TOTAL KNEE ARTHROPLASTY (Left) Right chest discomfort. Pain is worsening despite antibiotic therapy and continues to be unable to progress due to right sided chest pain. CT scan is reviewed and there is a large right abdomenal wall hernia with  Bowel somewhat distended. The right lower lobe lung with infiltrate. VQ scan was unremarkable. But perstent elevated WBC is concerning.   Patient reports pain as moderate.    Objective:   VITALS:  Temp:  [97.9 F (36.6 C)-98.3 F (36.8 C)] 97.9 F (36.6 C) (05/29 0939) Pulse Rate:  [78-91] 91 (05/29 0939) Resp:  [17-18] 18 (05/29 0939) BP: (116-158)/(63-94) 123/94 (05/29 0939) SpO2:  [90 %-95 %] 90 % (05/29 0939)  Neurologically intact ABD soft Neurovascular intact Sensation intact distally Intact pulses distally Dorsiflexion/Plantar flexion intact  CT of abdomen is reviewed and there is both a large right upper abdomenal hernia with bowel outside of the abdomenal wall posteriorly, also the gallbladder is about 4 x the size of that seen on previous study.  LABS Recent Labs    06/21/20 1629 06/22/20 1330 06/23/20 0115  HGB 12.7 11.7* 11.9*  WBC 17.9* 20.0* 19.4*  PLT 257 278 288   Recent Labs    06/22/20 1330 06/23/20 0115  NA 139 141  K 3.3* 4.0  CL 102 105  CO2 29 24  BUN 18 17  CREATININE 1.15* 1.10*  GLUCOSE 112* 97   No results for input(s): LABPT, INR in the last 72 hours.   Assessment/Plan: 9 Days Post-Op Procedure(s) (LRB): LEFT TOTAL KNEE ARTHROPLASTY (Left) Right upper quadrant hernia, pain right chest out of proportion to that  Expected with pneumonia, unable to participate in PT.  Advance diet Up with therapy  I discussed the results of the Abdomenal CT with Dr. Marcie Bal and  I think consideration of a surgical consult to be sure that the abdomenal hernia or the gall bladder are not part of the cause of her current incapacity to progress due to  right chest pain, her WBC appears to be remaining elevated despite antibiotic treatment.    [name] 06/24/2020, 1:02 PMPatient ID: Tracy Johnston, female   DOB: 1950/08/10, 70 y.o.   MRN: [id number]

## 2020-06-25 DIAGNOSIS — R1011 Right upper quadrant pain: Secondary | ICD-10-CM

## 2020-06-25 DIAGNOSIS — Z96652 Presence of left artificial knee joint: Secondary | ICD-10-CM | POA: Diagnosis not present

## 2020-06-25 DIAGNOSIS — R062 Wheezing: Secondary | ICD-10-CM | POA: Diagnosis not present

## 2020-06-25 LAB — RENAL FUNCTION PANEL
Albumin: 2 g/dL — ABNORMAL LOW (ref 3.5–5.0)
Anion gap: 10 (ref 5–15)
BUN: 25 mg/dL — ABNORMAL HIGH (ref 8–23)
CO2: 27 mmol/L (ref 22–32)
Calcium: 9.3 mg/dL (ref 8.9–10.3)
Chloride: 101 mmol/L (ref 98–111)
Creatinine, Ser: 1.24 mg/dL — ABNORMAL HIGH (ref 0.44–1.00)
GFR, Estimated: 47 mL/min — ABNORMAL LOW (ref >60)
Glucose, Bld: 151 mg/dL — ABNORMAL HIGH (ref 70–99)
Phosphorus: 3.2 mg/dL (ref 2.5–4.6)
Potassium: 3.9 mmol/L (ref 3.5–5.1)
Sodium: 138 mmol/L (ref 135–145)

## 2020-06-25 LAB — CBC WITH DIFFERENTIAL/PLATELET
Abs Immature Granulocytes: 0.29 10*3/uL — ABNORMAL HIGH (ref 0.00–0.07)
Basophils Absolute: 0 10*3/uL (ref 0.0–0.1)
Basophils Relative: 0 %
Eosinophils Absolute: 0 10*3/uL (ref 0.0–0.5)
Eosinophils Relative: 0 %
HCT: 32.2 % — ABNORMAL LOW (ref 36.0–46.0)
Hemoglobin: 11.3 g/dL — ABNORMAL LOW (ref 12.0–15.0)
Immature Granulocytes: 1 %
Lymphocytes Relative: 5 %
Lymphs Abs: 1.1 10*3/uL (ref 0.7–4.0)
MCH: 31.1 pg (ref 26.0–34.0)
MCHC: 35.1 g/dL (ref 30.0–36.0)
MCV: 88.7 fL (ref 80.0–100.0)
Monocytes Absolute: 1.5 10*3/uL — ABNORMAL HIGH (ref 0.1–1.0)
Monocytes Relative: 7 %
Neutro Abs: 20.1 10*3/uL — ABNORMAL HIGH (ref 1.7–7.7)
Neutrophils Relative %: 87 %
Platelets: 373 10*3/uL (ref 150–400)
RBC: 3.63 MIL/uL — ABNORMAL LOW (ref 3.87–5.11)
RDW: 13.6 % (ref 11.5–15.5)
WBC: 23 10*3/uL — ABNORMAL HIGH (ref 4.0–10.5)
nRBC: 0.3 % — ABNORMAL HIGH (ref 0.0–0.2)

## 2020-06-25 NOTE — Progress Notes (Signed)
10 Days Post-Op   Subjective/Chief Complaint: Ongoing pain. This is along the lower chest wall above the costal margin. Exacerbated by movement. No nausea or other GI symptoms   Objective: Vital signs in last 24 hours: Temp:  [97.4 F (36.3 C)-98.4 F (36.9 C)] 97.4 F (36.3 C) (05/30 0746) Pulse Rate:  [71-103] 71 (05/30 0746) Resp:  [17-19] 17 (05/30 0746) BP: (113-135)/(70-94) 120/70 (05/30 0746) SpO2:  [90 %-99 %] 97 % (05/30 0746) Last BM Date: 06/23/20  Intake/Output from previous day: No intake/output data recorded. Intake/Output this shift: No intake/output data recorded.  General appearance: alert, cooperative and chronically ill appearing Chest wall: subjective tenderness without palpable abnormality to right anterolateral lower rib cage Cardio: regular rate and rhythm GI: soft, obese, no RUQ or epigastric tenderness. lg hernia soft, nontender, reducible  Lab Results:  Recent Labs    06/24/20 1358 06/25/20 0110  WBC 26.1* 23.0*  HGB 13.1 11.3*  HCT 37.7 32.2*  PLT 407* 373   BMET Recent Labs    06/24/20 1358 06/25/20 0110  NA 139 138  K 3.8 3.9  CL 98 101  CO2 28 27  GLUCOSE 112* 151*  BUN 20 25*  CREATININE 1.36* 1.24*  CALCIUM 9.7 9.3   PT/INR No results for input(s): LABPROT, INR in the last 72 hours. ABG No results for input(s): PHART, HCO3 in the last 72 hours.  Invalid input(s): PCO2, PO2  Studies/Results: US Abdomen Limited RUQ (LIVER/GB)  Result Date: 06/24/2020 CLINICAL DATA:  RIGHT upper quadrant pain EXAM: ULTRASOUND ABDOMEN LIMITED RIGHT UPPER QUADRANT COMPARISON:  06/21/2020 FINDINGS: Gallbladder: There is biliary sludge. Gallbladder wall thickness is normal. No pericholecystic fluid. No sonographic Murphy sign noted by sonographer. Common bile duct: Diameter: 6 mm, normal Liver: No focal lesion identified. Within normal limits in parenchymal echogenicity. Portal vein is patent on color Doppler imaging with normal direction of blood  flow towards the liver. Other: Status post RIGHT nephrectomy. IMPRESSION: No sonographic etiology for RIGHT upper quadrant pain identified. There is biliary sludge without evidence of acute cholecystitis. Electronically Signed   By: Valentino Saxon MD   On: 06/24/2020 17:48    Anti-infectives: Anti-infectives (From admission, onward)   Start     Dose/Rate Route Frequency Ordered Stop   06/21/20 2000  cefTRIAXone (ROCEPHIN) 2 g in sodium chloride 0.9 % 100 mL IVPB        2 g 200 mL/hr over 30 Minutes Intravenous Every 24 hours 06/21/20 1857 06/26/20 1959   06/21/20 2000  azithromycin (ZITHROMAX) tablet 500 mg        500 mg Oral Daily 06/21/20 1857 06/23/20 0939   06/15/20 0600  ceFAZolin (ANCEF) IVPB 2g/100 mL premix        2 g 200 mL/hr over 30 Minutes Intravenous On call to O.R. 06/15/20 2751 06/15/20 0746      Assessment/Plan: 70 yo female s/p knee replacement. She has a large hernia from right nephrectomy. It is not incarcerated. [place] reviewed showing no signs of acute cholecystitis. Clinically not consistent w GB etiology. Symptoms are more consistent with msk etiology but could also be 2/2 pna No indication for acute surgical intervention. Will sign off.     LOS: 8 days    Clovis Riley 06/25/2020

## 2020-06-25 NOTE — Plan of Care (Signed)

## 2020-06-25 NOTE — Progress Notes (Signed)
Physical Therapy Treatment Patient Details Name: Tracy Johnston MRN: [id number] DOB: 02-22-1950 Today's Date: 06/25/2020    History of Present Illness Pt is a 70 y/o female s/p L TKA. She also has a historyu of a hernia which the MD's belive is causing some of her gflank pain.  PMH includes COPD, cervical cancer, a fib, HTN, tobbacco use, and bilateral THA.    PT Comments    Pt eager to participate in PT today and get up OOB and walk. Remained conservative regarding L knee flexion due to previous PT notes referencing NO L KNEE Flexion/ROM per Dr. Lorin Mercy. Will need to follow up tomorrow as MD not here today due to holiday. Pt did ambulated 12' today with RW and max encouragement with step by step instruction. Acute PT to cont to follow.   Follow Up Recommendations  SNF     Equipment Recommendations  Rolling walker with 5" wheels    Recommendations for Other Services       Precautions / Restrictions Precautions Precautions: Fall;Knee Required Braces or Orthoses: Knee Immobilizer - Left Knee Immobilizer - Right: On when out of bed or walking Restrictions Weight Bearing Restrictions: Yes LLE Weight Bearing: Weight bearing as tolerated Other Position/Activity Restrictions: NO knee ROM, KI to be worn during ambulation per previous PT notes, however there are no MD orders for this    Mobility  Bed Mobility Overal bed mobility: Needs Assistance Bed Mobility: Supine to Sit     Supine to sit: Min assist     General bed mobility comments: minA for hip management to EOB, pt did initiate L LE mangement off EOB without use of gait belt    Transfers Overall transfer level: Needs assistance Equipment used: Rolling walker (2 wheeled) Transfers: Sit to/from Stand Sit to Stand: Mod assist;+2 physical assistance         General transfer comment: modA x2 to power up from elevated surface, modA to steady during transition of hands as well  Ambulation/Gait Ambulation/Gait assistance:  Mod assist;+2 safety/equipment Gait Distance (Feet): 12 Feet Assistive device: Rolling walker (2 wheeled) Gait Pattern/deviations: Step-to pattern;Antalgic;Trunk flexed;Decreased stride length;Decreased stance time - left Gait velocity: decreased Gait velocity interpretation: <1.31 ft/sec, indicative of household ambulator General Gait Details: slow step to pattern over to the chair. Cuing to turn towards the chair. Reported flank pain with gait. No significant knee pain.   Stairs             Wheelchair Mobility    Modified Rankin (Stroke Patients Only)       Balance Overall balance assessment: Needs assistance Sitting-balance support: Feet supported;Bilateral upper extremity supported Sitting balance-Leahy Scale: Fair     Standing balance support: Bilateral upper extremity supported;During functional activity Standing balance-Leahy Scale: Poor Standing balance comment: external support required                            Cognition Arousal/Alertness: Awake/alert Behavior During Therapy: WFL for tasks assessed/performed Overall Cognitive Status: Within Functional Limits for tasks assessed                                 General Comments: pt eager to get up and work with therapy today, pt in good spirits      Exercises Total Joint Exercises Ankle Circles/Pumps: AROM;Both;10 reps;Supine Quad Sets: AROM;Left;10 reps;Supine Goniometric ROM: NO ROM due to previous note from PT per  Dr. Lorin Mercy no knee flexion    General Comments General comments (skin integrity, edema, etc.): pt reports "that binder doesn't do me any good" pt on 2LO2 via [place]      Pertinent Vitals/Pain Pain Assessment: 0-10 Pain Score: 6  Pain Location: L knee Pain Descriptors / Indicators: Grimacing;Guarding;Tightness Pain Intervention(s): Limited activity within patient's tolerance    Home Living                      Prior Function            PT Goals  (current goals can now be found in the care plan section) Progress towards PT goals: Progressing toward goals    Frequency    7X/week      PT Plan Current plan remains appropriate    Co-evaluation              AM-PAC PT "6 Clicks" Mobility   Outcome Measure  Help needed turning from your back to your side while in a flat bed without using bedrails?: A Little Help needed moving from lying on your back to sitting on the side of a flat bed without using bedrails?: A Little Help needed moving to and from a bed to a chair (including a wheelchair)?: A Lot Help needed standing up from a chair using your arms (e.g., wheelchair or bedside chair)?: A Lot Help needed to walk in hospital room?: A Lot Help needed climbing 3-5 steps with a railing? : A Lot 6 Click Score: 14    End of Session Equipment Utilized During Treatment: Gait belt Activity Tolerance: Patient limited by pain;Patient limited by fatigue Patient left: in chair;with call bell/phone within reach;with chair alarm set Nurse Communication: Mobility status PT Visit Diagnosis: Unsteadiness on feet (R26.81);Difficulty in walking, not elsewhere classified (R26.2);Pain;Muscle weakness (generalized) (M62.81) Pain - Right/Left: Left Pain - part of body: Knee     Time: 5009-3818 PT Time Calculation (min) (ACUTE ONLY): 22 min  Charges:  $Gait Training: 8-22 mins                     Tracy Johnston, PT, DPT Acute Rehabilitation Services Pager #: [phone] Office #: [phone]    Berline Lopes 06/25/2020, 12:19 PM

## 2020-06-25 NOTE — Progress Notes (Signed)
PROGRESS NOTE    Tracy Johnston  WLN:[id number] DOB: 22-Jan-1951 DOA: 06/15/2020 PCP: Leeanne Rio, MD   Brief Narrative: 70 year old female with history of COPD, hypertension, A. fib, asthma and gout and other comorbidities who was admitted under orthopedic surgery underwent right total left knee replacement and was waiting for discharge to skilled nursing facility once he started to have pain on the right side of the chest and shortness of breath and medicine team was consulted reparations was called.  Patient was evaluated by hospitalist team and underwent extensive work-up including troponin and serial there were negative procalcitonin negative had leukocytosis, D-dimers were elevated in 3, VQ scan was done and CT abdomen pelvis also ordered due to concern for hernia and pain on the right side.  06/23/2020: Patient seen.  Patient continues to report right lateral chest wall pain.  Patient was also noted to be on prednisone as needed.  Will change to prednisone 40 Mg p.o. once daily.  We will continue nebulizer treatment.  We will continue treatment for possible pneumonia.  Will place lidocaine patch to right lateral chest wall area.  06/24/2020: Patient seen.  Discussed with the orthopedic surgeon, Dr. Louanne Skye, extensively.  Orthopedic surgery team was concerned about possible gallbladder pathology.  Will order right upper quadrant ultrasound.  We will repeat CBC, CMP, procalcitonin and lactic acid level.  Discussed case with the surgery team, Dr. Alvino Blood.  Right-sided abdominal/right lateral chest wall pain persists.  06/25/2020: Patient seen.  Surgical input is appreciated.  General surgery team does not plan any further surgical treatment.  Right upper quadrant ultrasound revealed normal gallbladder.  Vascular surgery team is now worried about incarceration of the colon no ischemic bowel.  Slight improvement in leukocytosis.  WBC is down to 23,000.  We will discontinue oral prednisone.  Patient's  pain seems better controlled today.  Subjective: Patient continues to report right lateral chest wall pain.  Assessment & Plan:  Left knee osteoarthritis status post left knee replacement continue plan of care DVT prophylaxis PT OT pain control as per primary orthopedic surgery team.  Right chest pain/right lateral abdominal pain/Rapid Response at time of discharge 5/26:evaluated by hospitalist team and underwent extensive work-up including troponin and serial there were negative procalcitonin negative had leukocytosis, D-dimers were elevated in 3, VQ scan was done and CT abdomen pelvis also ordered due to concern for hernia and pain on the right side.  Low suspicion of PE, patient already anticoagulated.  She is afebrile but has leukocytosis has been empirically placed on antibiotics but unclear infection at this time.  Duplex of the liver negative for DVT.  I will order lipase, amylase and CMP thsi am. follow-up CT abdomen pelvis, there is concern for hernia at the site of previous right kidney surgery, follow-up VQ scan, symptomatic management. 06/23/2020: CT scan of the abdomen and pelvis done on 06/22/2020 revealed:  1. No acute intra-abdominal or pelvic pathology. 2. Colonic diverticulosis. No bowel obstruction. Normal appendix. 3. Broad-based right lateral hernia containing a segment of the colon. 4. Status post prior right nephrectomy. 5. Aortic Atherosclerosis (ICD10-I70.0).  Will apply lidocaine patch to the right lateral chest area.  06/24/2020: See above documentation.  For right upper quadrant ultrasound.  We will also check lactic acid, CBC, CMP and procalcitonin.  Continue antibiotics.  Surgery team has been consulted. 06/25/2020: Right upper quadrant ultrasound was negative.  COPD: -Continue nebs treatment. -Discontinue oral prednisone.  Leukocytosis unclear etiology, has been running on higher side with slight worsening  yesterday. In the setting of postop status, Pro-Cal  negative reassuring repeat lab empirically placed on ceftriaxone azithromycin yesterday. 06/23/2020: Continue to monitor WBC. 06/25/2020: Slowly improving.  Continue antibiotics for now. Recent Labs  Lab 06/21/20 1629 06/21/20 1743 06/22/20 1330 06/23/20 0115 06/24/20 1358 06/24/20 1643 06/25/20 0110  WBC 17.9*  --  20.0* 19.4* 26.1*  --  23.0*  LATICACIDVEN  --   --   --   --  2.8* 1.6  --   PROCALCITON  --  <0.10  --   --  0.16  --   --    Hypertension: -Continue to monitor closely.    HLD on simvastatin.  A. fib on anticoagulation with Xarelto, rate controlled on Cardizem.  Gout: On allopurinol  Morbid obesity BMI 35 will benefit with weight loss.  Outpatient GI follow-up and healthy lifestyle and diet.  Diet Order            Diet regular Room service appropriate? Yes with Assist; Fluid consistency: Thin  Diet effective now               Patient's Body mass index is 35.19 kg/m.  DVT prophylaxis: SCDs Start: 06/15/20 1222 Xarelto Code Status:   Code Status: Full Code  Family Communication: plan of care discussed with patient at bedside.  Status is: Inpatient Remains inpatient appropriate because:Inpatient level of care appropriate due to severity of illness  Dispo: The patient is from: Home              Anticipated d/c is to: SNF              Patient currently is not medically stable to d/c.   Difficult to place patient No Unresulted Labs (From admission, onward)          Start     Ordered   06/21/20 1853  Urinalysis, Routine w reflex microscopic Urine, Unspecified Source  Once,   R        06/21/20 1857   Signed and Held  Surgical pcr screen  Once,   R        Signed and Held          Medications reviewed:  Scheduled Meds: . allopurinol  300 mg Oral Daily  . diltiazem  300 mg Oral Daily  . docusate sodium  100 mg Oral BID  . ferrous sulfate  325 mg Oral TID PC  . fluticasone furoate-vilanterol  1 puff Inhalation Daily  . hydrochlorothiazide  12.5 mg  Oral Daily  . irbesartan  300 mg Oral Daily  . lidocaine  1 patch Transdermal Q24H  . mirabegron ER  50 mg Oral Daily  . montelukast  10 mg Oral QHS  . polyethylene glycol  17 g Oral Daily  . rivaroxaban  20 mg Oral Q supper  . simvastatin  20 mg Oral Daily  . umeclidinium bromide  1 puff Inhalation Daily   Continuous Infusions: . sodium chloride Stopped (06/16/20 1200)  . cefTRIAXone (ROCEPHIN)  IV 2 g (06/24/20 2127)    Consultants:see note  Procedures:see note  Antimicrobials: Anti-infectives (From admission, onward)   Start     Dose/Rate Route Frequency Ordered Stop   06/21/20 2000  cefTRIAXone (ROCEPHIN) 2 g in sodium chloride 0.9 % 100 mL IVPB        2 g 200 mL/hr over 30 Minutes Intravenous Every 24 hours 06/21/20 1857 06/26/20 1959   06/21/20 2000  azithromycin (ZITHROMAX) tablet 500 mg  500 mg Oral Daily 06/21/20 1857 06/23/20 0939   06/15/20 0600  ceFAZolin (ANCEF) IVPB 2g/100 mL premix        2 g 200 mL/hr over 30 Minutes Intravenous On call to O.R. 06/15/20 0549 06/15/20 0746     Culture/Microbiology No results found for: SDES, SPECREQUEST, CULT, REPTSTATUS  Other culture-see note  Objective: Vitals: Today's Vitals   06/25/20 0746 06/25/20 0903 06/25/20 1003 06/25/20 1426  BP: 120/70   99/62  Pulse: 71   87  Resp: 17   17  Temp: (!) 97.4 F (36.3 C)   97.6 F (36.4 C)  TempSrc: Oral   Oral  SpO2: 97%   94%  Weight:      Height:      PainSc:  7  Asleep     Intake/Output Summary (Last 24 hours) at 06/25/2020 1717 Last data filed at 06/25/2020 1633 Gross per 24 hour  Intake --  Output 200 ml  Net -200 ml   Filed Weights   06/15/20 0559  Weight: 93 kg   Weight change:   Intake/Output from previous day: No intake/output data recorded. Intake/Output this shift: Total I/O In: -  Out: 200 [Urine:200] Filed Weights   06/15/20 0559  Weight: 93 kg    Examination: General exam: AAO x3 , older than stated age. HEENT:Oral mucosa moist,  Ear/Nose WNL grossly,dentition normal. Respiratory system: bilaterally diminished Cardiovascular system: S1 & S2  Gastrointestinal system: Abdomen soft, swollen reducible mass in the right abdomen tender, bowel sound present  Nervous System:Alert, awake, moving extremities Extremities: Left lower extremity edema is improving.    Data Reviewed: I have personally reviewed following labs and imaging studies CBC: Recent Labs  Lab 06/21/20 1629 06/22/20 1330 06/23/20 0115 06/24/20 1358 06/25/20 0110  WBC 17.9* 20.0* 19.4* 26.1* 23.0*  NEUTROABS 16.3*  --   --  24.3* 20.1*  HGB 12.7 11.7* 11.9* 13.1 11.3*  HCT 36.1 34.1* 33.9* 37.7 32.2*  MCV 88.5 89.7 89.2 90.0 88.7  PLT 257 278 288 407* 277   Basic Metabolic Panel: Recent Labs  Lab 06/22/20 1330 06/23/20 0115 06/24/20 1358 06/25/20 0110  NA 139 141 139 138  K 3.3* 4.0 3.8 3.9  CL 102 105 98 101  CO2 29 24 28 27   GLUCOSE 112* 97 112* 151*  BUN 18 17 20  25*  CREATININE 1.15* 1.10* 1.36* 1.24*  CALCIUM 9.0 9.2 9.7 9.3  MG  --   --  2.2  --   PHOS  --   --   --  3.2   GFR: Estimated Creatinine Clearance: 46 mL/min (A) (by C-G formula based on SCr of 1.24 mg/dL (H)). Liver Function Tests: Recent Labs  Lab 06/22/20 1330 06/23/20 0115 06/24/20 1358 06/25/20 0110  AST 19 16 25   --   ALT 13 15 20   --   ALKPHOS 72 75 104  --   BILITOT 1.1 0.8 1.1  --   PROT 6.2* 6.1* 7.5  --   ALBUMIN 2.2* 2.2* 2.4* 2.0*   Recent Labs  Lab 06/22/20 1330  LIPASE 22  AMYLASE 58   No results for input(s): AMMONIA in the last 168 hours. Coagulation Profile: No results for input(s): INR, PROTIME in the last 168 hours. Cardiac Enzymes: No results for input(s): CKTOTAL, CKMB, CKMBINDEX, TROPONINI in the last 168 hours. BNP (last 3 results) No results for input(s): PROBNP in the last 8760 hours. HbA1C: No results for input(s): HGBA1C in the last 72 hours. CBG: No results  for input(s): GLUCAP in the last 168 hours. Lipid  Profile: No results for input(s): CHOL, HDL, LDLCALC, TRIG, CHOLHDL, LDLDIRECT in the last 72 hours. Thyroid Function Tests: No results for input(s): TSH, T4TOTAL, FREET4, T3FREE, THYROIDAB in the last 72 hours. Anemia Panel: Recent Labs    06/24/20 1358  TIBC 252  IRON 35   Sepsis Labs: Recent Labs  Lab 06/21/20 1743 06/24/20 1358 06/24/20 1643  PROCALCITON <0.10 0.16  --   LATICACIDVEN  --  2.8* 1.6    Recent Results (from the past 240 hour(s))  SARS CORONAVIRUS 2 (TAT 6-24 HRS) Nasopharyngeal Nasopharyngeal Swab     Status: None   Collection Time: 06/20/20  1:34 PM   Specimen: Nasopharyngeal Swab  Result Value Ref Range Status   SARS Coronavirus 2 NEGATIVE NEGATIVE Final    Comment: (NOTE) SARS-CoV-2 target nucleic acids are NOT DETECTED.  The SARS-CoV-2 RNA is generally detectable in upper and lower respiratory specimens during the acute phase of infection. Negative results do not preclude SARS-CoV-2 infection, do not rule out co-infections with other pathogens, and should not be used as the sole basis for treatment or other patient management decisions. Negative results must be combined with clinical observations, patient history, and epidemiological information. The expected result is Negative.  Fact Sheet for Patients: SugarRoll.be  Fact Sheet for Healthcare Providers: https://www.woods-mathews.com/  This test is not yet approved or cleared by the [place] FDA and  has been authorized for detection and/or diagnosis of SARS-CoV-2 by FDA under an Emergency Use Authorization (EUA). This EUA will remain  in effect (meaning this test can be used) for the duration of the COVID-19 declaration under Se ction 564(b)(1) of the Act, 21 U.S.C. section 360bbb-3(b)(1), unless the authorization is terminated or revoked sooner.  Performed at [facility], [place] [address], [place], [place] [address]      Radiology  Studies: US Abdomen Limited RUQ (LIVER/GB)  Result Date: 06/24/2020 CLINICAL DATA:  RIGHT upper quadrant pain EXAM: ULTRASOUND ABDOMEN LIMITED RIGHT UPPER QUADRANT COMPARISON:  06/21/2020 FINDINGS: Gallbladder: There is biliary sludge. Gallbladder wall thickness is normal. No pericholecystic fluid. No sonographic Murphy sign noted by sonographer. Common bile duct: Diameter: 6 mm, normal Liver: No focal lesion identified. Within normal limits in parenchymal echogenicity. Portal vein is patent on color Doppler imaging with normal direction of blood flow towards the liver. Other: Status post RIGHT nephrectomy. IMPRESSION: No sonographic etiology for RIGHT upper quadrant pain identified. There is biliary sludge without evidence of acute cholecystitis. Electronically Signed   By: Valentino Saxon MD   On: 06/24/2020 17:48     LOS: 8 days   Bonnell Public, MD [facility]  06/25/2020, 5:17 PM

## 2020-06-26 DIAGNOSIS — Z96652 Presence of left artificial knee joint: Secondary | ICD-10-CM | POA: Diagnosis not present

## 2020-06-26 DIAGNOSIS — D72829 Elevated white blood cell count, unspecified: Secondary | ICD-10-CM | POA: Diagnosis not present

## 2020-06-26 DIAGNOSIS — Y95 Nosocomial condition: Secondary | ICD-10-CM

## 2020-06-26 DIAGNOSIS — J189 Pneumonia, unspecified organism: Secondary | ICD-10-CM | POA: Diagnosis not present

## 2020-06-26 DIAGNOSIS — R1011 Right upper quadrant pain: Secondary | ICD-10-CM | POA: Diagnosis not present

## 2020-06-26 LAB — CBC WITH DIFFERENTIAL/PLATELET
Abs Immature Granulocytes: 0.25 10*3/uL — ABNORMAL HIGH (ref 0.00–0.07)
Basophils Absolute: 0 10*3/uL (ref 0.0–0.1)
Basophils Relative: 0 %
Eosinophils Absolute: 0 10*3/uL (ref 0.0–0.5)
Eosinophils Relative: 0 %
HCT: 33.3 % — ABNORMAL LOW (ref 36.0–46.0)
Hemoglobin: 11.7 g/dL — ABNORMAL LOW (ref 12.0–15.0)
Immature Granulocytes: 1 %
Lymphocytes Relative: 9 %
Lymphs Abs: 1.6 10*3/uL (ref 0.7–4.0)
MCH: 31.1 pg (ref 26.0–34.0)
MCHC: 35.1 g/dL (ref 30.0–36.0)
MCV: 88.6 fL (ref 80.0–100.0)
Monocytes Absolute: 1.1 10*3/uL — ABNORMAL HIGH (ref 0.1–1.0)
Monocytes Relative: 6 %
Neutro Abs: 14.3 10*3/uL — ABNORMAL HIGH (ref 1.7–7.7)
Neutrophils Relative %: 84 %
Platelets: 435 10*3/uL — ABNORMAL HIGH (ref 150–400)
RBC: 3.76 MIL/uL — ABNORMAL LOW (ref 3.87–5.11)
RDW: 13.4 % (ref 11.5–15.5)
WBC: 17.3 10*3/uL — ABNORMAL HIGH (ref 4.0–10.5)
nRBC: 0.1 % (ref 0.0–0.2)

## 2020-06-26 MED ORDER — SODIUM CHLORIDE 0.9 % IV SOLN
2.0000 g | INTRAVENOUS | Status: DC
  Administered 2020-06-26 – 2020-06-27 (×2): 2 g via INTRAVENOUS
  Filled 2020-06-26 (×3): qty 20

## 2020-06-26 NOTE — Progress Notes (Signed)
PROGRESS NOTE    Tracy Johnston  WLN:[id number] DOB: 03-Jun-1950 DOA: 06/15/2020 PCP: Leeanne Rio, MD   Brief Narrative: Patient is a 70 year old can American female female with past medical history significant for COPD, hypertension, A. fib, asthma and gout amongst other medical problems.  Patient was admitted under orthopedic team and underwent right total left knee replacement and was waiting for discharge to skilled nursing facility when patient developed pain on the right side of the chest with associated shortness of breath.  Hospitalist team was consulted to assist with patient's management.  Patient has been worked up extensively.  Patient has significant leukocytosis that is now on slowly trending downwards.  There was concern for possible right-sided pneumonia.  Patient has been on IV Rocephin 2 g every 24 hours.  Patient has right lateral abdominal wall hernia following nephrectomy.  Surgical team was consulted to rule out incarceration of the bowel and no further surgery was advised by the general surgery team.  Orthopedic team was initially concerned for possible gallbladder pathology.  However, right upper quadrant ultrasound came back normal.  Patient has had Rocephin for about 5 days.  We will continue IV Rocephin to complete 7 to 10-day course.  Continue to monitor WBC.  Patient is slowly improving.    Subjective: Right lateral chest wall pain is improving.  Assessment & Plan:  Left knee osteoarthritis status post left knee replacement continue plan of care DVT prophylaxis PT OT pain control as per primary orthopedic surgery team.  Right chest pain/right lateral abdominal pain/Rapid Response at time of discharge 5/26: -Patient has been worked up extensively.   -History of right lateral abdominal wall hernia following query right nephrectomy. -Cardiac enzymes came back negative.   -Elevated D-dimers, however, patient has recent surgery. -VQ scan was suggestive of low  probability for pulmonary embolism.  -Significant leukocytosis. -CT scan of the abdomen and pelvis without contrast did not reveal any significant findings. -Right upper quadrant ultrasound was negative for gallbladder pathology. -Concerns for possible right lower lobe pneumonia. -Patient has been on IV Rocephin. -Leukocytosis is now starting to improve.  06/23/2020: CT scan of the abdomen and pelvis done on 06/22/2020 revealed:  1. No acute intra-abdominal or pelvic pathology. 2. Colonic diverticulosis. No bowel obstruction. Normal appendix. 3. Broad-based right lateral hernia containing a segment of the colon. 4. Status post prior right nephrectomy. 5. Aortic Atherosclerosis (ICD10-I70.0).  COPD: -Continue nebs treatment. -Discontinuedoral prednisone.  Leukocytosis: -Unclear etiology -Worked up extensively (see above documentation). -Concern for possible pneumonia. -Patient has been on IV Rocephin. -WBC is not improving.  WBC is down to 17,000 today  Recent Labs  Lab 06/21/20 1743 06/22/20 1330 06/23/20 0115 06/24/20 1358 06/24/20 1643 06/25/20 0110 06/26/20 0709  WBC  --  20.0* 19.4* 26.1*  --  23.0* 17.3*  LATICACIDVEN  --   --   --  2.8* 1.6  --   --   PROCALCITON <0.10  --   --  0.16  --   --   --    Hypertension: -Continue to monitor closely.    HLD on simvastatin.  A. fib on anticoagulation with Xarelto, rate controlled on Cardizem.  Gout: On allopurinol  Morbid obesity BMI 35 will benefit with weight loss.  Outpatient GI follow-up and healthy lifestyle and diet.  Diet Order            Diet regular Room service appropriate? Yes with Assist; Fluid consistency: Thin  Diet effective now  Patient's Body mass index is 35.19 kg/m.  DVT prophylaxis: SCDs Start: 06/15/20 1222 Xarelto Code Status:   Code Status: Full Code  Family Communication: plan of care discussed with patient at bedside.  Status is: Inpatient Remains inpatient  appropriate because:Inpatient level of care appropriate due to severity of illness  Dispo: The patient is from: Home              Anticipated d/c is to: SNF              Patient currently is not medically stable to d/c.   Difficult to place patient No Unresulted Labs (From admission, onward)          Start     Ordered   06/21/20 1853  Urinalysis, Routine w reflex microscopic Urine, Unspecified Source  Once,   R        06/21/20 1857   Signed and Held  Surgical pcr screen  Once,   R        Signed and Held          Medications reviewed:  Scheduled Meds: . allopurinol  300 mg Oral Daily  . diltiazem  300 mg Oral Daily  . docusate sodium  100 mg Oral BID  . ferrous sulfate  325 mg Oral TID PC  . fluticasone furoate-vilanterol  1 puff Inhalation Daily  . hydrochlorothiazide  12.5 mg Oral Daily  . irbesartan  300 mg Oral Daily  . lidocaine  1 patch Transdermal Q24H  . mirabegron ER  50 mg Oral Daily  . montelukast  10 mg Oral QHS  . polyethylene glycol  17 g Oral Daily  . rivaroxaban  20 mg Oral Q supper  . simvastatin  20 mg Oral Daily  . umeclidinium bromide  1 puff Inhalation Daily   Continuous Infusions: . sodium chloride Stopped (06/16/20 1200)  . cefTRIAXone (ROCEPHIN)  IV 2 g (06/26/20 1210)    Consultants:see note  Procedures:see note  Antimicrobials: Anti-infectives (From admission, onward)   Start     Dose/Rate Route Frequency Ordered Stop   06/26/20 1200  cefTRIAXone (ROCEPHIN) 2 g in sodium chloride 0.9 % 100 mL IVPB        2 g 200 mL/hr over 30 Minutes Intravenous Every 24 hours 06/26/20 1101 07/01/20 1159   06/21/20 2000  cefTRIAXone (ROCEPHIN) 2 g in sodium chloride 0.9 % 100 mL IVPB        2 g 200 mL/hr over 30 Minutes Intravenous Every 24 hours 06/21/20 1857 06/25/20 2049   06/21/20 2000  azithromycin (ZITHROMAX) tablet 500 mg        500 mg Oral Daily 06/21/20 1857 06/23/20 0939   06/15/20 0600  ceFAZolin (ANCEF) IVPB 2g/100 mL premix        2 g 200  mL/hr over 30 Minutes Intravenous On call to O.R. 06/15/20 0549 06/15/20 0746     Culture/Microbiology No results found for: SDES, SPECREQUEST, CULT, REPTSTATUS  Other culture-see note  Objective: Vitals: Today's Vitals   06/26/20 0440 06/26/20 0626 06/26/20 0711 06/26/20 1258  BP: 120/71   111/69  Pulse: 76   71  Resp: 17   18  Temp: (!) 97.4 F (36.3 C)   97.7 F (36.5 C)  TempSrc: Oral   Oral  SpO2: 98%   92%  Weight:      Height:      PainSc:  8  4     No intake or output data in the 24 hours ending  06/26/20 1839 Filed Weights   06/15/20 0559  Weight: 93 kg   Weight change:   Intake/Output from previous day: 05/30 0701 - 05/31 0700 In: -  Out: 200 [Urine:200] Intake/Output this shift: No intake/output data recorded. Filed Weights   06/15/20 0559  Weight: 93 kg    Examination: General exam: AAO x3 , older than stated age. HEENT:Oral mucosa moist, Ear/Nose WNL grossly,dentition normal. Respiratory system: bilaterally diminished Cardiovascular system: S1 & S2  Gastrointestinal system: Abdomen soft, swollen reducible mass in the right abdomen tender, bowel sound present  Nervous System:Alert, awake, moving extremities Extremities: Left lower extremity edema is improving.    Data Reviewed: I have personally reviewed following labs and imaging studies CBC: Recent Labs  Lab 06/21/20 1629 06/22/20 1330 06/23/20 0115 06/24/20 1358 06/25/20 0110 06/26/20 0709  WBC 17.9* 20.0* 19.4* 26.1* 23.0* 17.3*  NEUTROABS 16.3*  --   --  24.3* 20.1* 14.3*  HGB 12.7 11.7* 11.9* 13.1 11.3* 11.7*  HCT 36.1 34.1* 33.9* 37.7 32.2* 33.3*  MCV 88.5 89.7 89.2 90.0 88.7 88.6  PLT 257 278 288 407* 373 741*   Basic Metabolic Panel: Recent Labs  Lab 06/22/20 1330 06/23/20 0115 06/24/20 1358 06/25/20 0110  NA 139 141 139 138  K 3.3* 4.0 3.8 3.9  CL 102 105 98 101  CO2 29 24 28 27   GLUCOSE 112* 97 112* 151*  BUN 18 17 20  25*  CREATININE 1.15* 1.10* 1.36* 1.24*  CALCIUM  9.0 9.2 9.7 9.3  MG  --   --  2.2  --   PHOS  --   --   --  3.2   GFR: Estimated Creatinine Clearance: 46 mL/min (A) (by C-G formula based on SCr of 1.24 mg/dL (H)). Liver Function Tests: Recent Labs  Lab 06/22/20 1330 06/23/20 0115 06/24/20 1358 06/25/20 0110  AST 19 16 25   --   ALT 13 15 20   --   ALKPHOS 72 75 104  --   BILITOT 1.1 0.8 1.1  --   PROT 6.2* 6.1* 7.5  --   ALBUMIN 2.2* 2.2* 2.4* 2.0*   Recent Labs  Lab 06/22/20 1330  LIPASE 22  AMYLASE 58   No results for input(s): AMMONIA in the last 168 hours. Coagulation Profile: No results for input(s): INR, PROTIME in the last 168 hours. Cardiac Enzymes: No results for input(s): CKTOTAL, CKMB, CKMBINDEX, TROPONINI in the last 168 hours. BNP (last 3 results) No results for input(s): PROBNP in the last 8760 hours. HbA1C: No results for input(s): HGBA1C in the last 72 hours. CBG: No results for input(s): GLUCAP in the last 168 hours. Lipid Profile: No results for input(s): CHOL, HDL, LDLCALC, TRIG, CHOLHDL, LDLDIRECT in the last 72 hours. Thyroid Function Tests: No results for input(s): TSH, T4TOTAL, FREET4, T3FREE, THYROIDAB in the last 72 hours. Anemia Panel: Recent Labs    06/24/20 1358  TIBC 252  IRON 35   Sepsis Labs: Recent Labs  Lab 06/21/20 1743 06/24/20 1358 06/24/20 1643  PROCALCITON <0.10 0.16  --   LATICACIDVEN  --  2.8* 1.6    Recent Results (from the past 240 hour(s))  SARS CORONAVIRUS 2 (TAT 6-24 HRS) Nasopharyngeal Nasopharyngeal Swab     Status: None   Collection Time: 06/20/20  1:34 PM   Specimen: Nasopharyngeal Swab  Result Value Ref Range Status   SARS Coronavirus 2 NEGATIVE NEGATIVE Final    Comment: (NOTE) SARS-CoV-2 target nucleic acids are NOT DETECTED.  The SARS-CoV-2 RNA is generally detectable  in upper and lower respiratory specimens during the acute phase of infection. Negative results do not preclude SARS-CoV-2 infection, do not rule out co-infections with other  pathogens, and should not be used as the sole basis for treatment or other patient management decisions. Negative results must be combined with clinical observations, patient history, and epidemiological information. The expected result is Negative.  Fact Sheet for Patients: SugarRoll.be  Fact Sheet for Healthcare Providers: https://www.woods-mathews.com/  This test is not yet approved or cleared by the [place] FDA and  has been authorized for detection and/or diagnosis of SARS-CoV-2 by FDA under an Emergency Use Authorization (EUA). This EUA will remain  in effect (meaning this test can be used) for the duration of the COVID-19 declaration under Se ction 564(b)(1) of the Act, 21 U.S.C. section 360bbb-3(b)(1), unless the authorization is terminated or revoked sooner.  Performed at [facility], [place] [address], [place], [place] [address]      Radiology Studies: No results found.   LOS: 9 days   Bonnell Public, MD [facility]  06/26/2020, 6:39 PM

## 2020-06-26 NOTE — TOC Progression Note (Signed)
Transition of Care [facility]) - Progression Note    Patient Details  Name: Tracy Johnston MRN: [id number] Date of Birth: 06-Nov-1950  Transition of Care [place]) CM/SW Contact  Milinda Antis, [place] Phone Number: 06/26/2020, 1:24 PM  Clinical Narrative:    CSW received a call from the attending. Patient is not medically ready to d/c.  Mardene Celeste at [facility] notified.  Mardene Celeste asked that CSW call the agency once the patient is closer to d/c to check for bed availability.    Insurance authorization will have to be submitted again when the patient is closer to discharge.     Expected Discharge Plan: Skilled Nursing Facility Barriers to Discharge: Barriers Resolved  Expected Discharge Plan and Services Expected Discharge Plan: [place]   Discharge Planning Services: CM Consult Post Acute Care Choice: [place] arrangements for the past 2 months: Single Family Home                                       Social Determinants of Health (SDOH) Interventions    Readmission Risk Interventions No flowsheet data found.

## 2020-06-26 NOTE — Progress Notes (Signed)
Subjective: Left knee doing well.  Pain controlled.  Has been up and ambulating with PT.     Objective: Vital signs in last 24 hours: Temp:  [97.3 F (36.3 C)-97.7 F (36.5 C)] 97.7 F (36.5 C) (05/31 1258) Pulse Rate:  [71-89] 71 (05/31 1258) Resp:  [17-18] 18 (05/31 1258) BP: (99-135)/(62-77) 111/69 (05/31 1258) SpO2:  [90 %-98 %] 92 % (05/31 1258)  Intake/Output from previous day: 05/30 0701 - 05/31 0700 In: -  Out: 200 [Urine:200] Intake/Output this shift: No intake/output data recorded.  Recent Labs    06/24/20 1358 06/25/20 0110 06/26/20 0709  HGB 13.1 11.3* 11.7*   Recent Labs    06/25/20 0110 06/26/20 0709  WBC 23.0* 17.3*  RBC 3.63* 3.76*  HCT 32.2* 33.3*  PLT 373 435*   Recent Labs    06/24/20 1358 06/25/20 0110  NA 139 138  K 3.8 3.9  CL 98 101  CO2 28 27  BUN 20 25*  CREATININE 1.36* 1.24*  GLUCOSE 112* 151*  CALCIUM 9.7 9.3   No results for input(s): LABPT, INR in the last 72 hours.  Exam Pleasant female, alert and oriented, NAD. Left knee wound looks good.  Staples intact.  No drainage or signs of infection.  Calf nontender.      Assessment/Plan: -stable from orthopedic standpoint.  Continue present care.  -Appreciate medicine and general surgery assistance.   -discharge to SNF when cleared by medicine service.       Benjiman Core 06/26/2020, 2:13 PM

## 2020-06-26 NOTE — Progress Notes (Signed)
Occupational Therapy Treatment Patient Details Name: Tracy Johnston MRN: [id number] DOB: 11/23/1950 Today's Date: 06/26/2020    History of present illness Pt is a 70 y/o female s/p L TKA. She also has a historyu of a hernia which the MD's belive is causing some of her gflank pain.  PMH includes COPD, cervical cancer, a fib, HTN, tobbacco use, and bilateral THA.   OT comments  [name] is progressing well today, she was up with PT upon arrival. She was close min guard for functional mobility with rw, pt required frequent vc for safe hand placement on the rw as she was attempting to furniture cruise once in the room. Pt also completed bed mobility with close min guard given increased time for pain management. Pt given BUE exercises to do while supported upright in bed, she demonstrated good understanding and tolerated the level 1 theraband. With with reports of donning her underwear indep today, as well as completing stand pivot transfer to [facility] with rw indep, RN aware. Pt continues to benefit from acute OT to progress functional mobility and ADL performance. D/c plan remains appropriate.    Follow Up Recommendations  SNF;Supervision/Assistance - 24 hour    Equipment Recommendations  Other (comment)    Recommendations for Other Services Other (comment)    Precautions / Restrictions Precautions Precautions: Fall;Knee Precaution Booklet Issued: No Precaution Comments: Verbally reviewed knee precautions with pt. Restrictions Weight Bearing Restrictions: Yes LLE Weight Bearing: Weight bearing as tolerated Other Position/Activity Restrictions: 06/26/20 PT spoke with MD and patient able to perform full ROM to L knee.  NO Knee immobilizer needed for ambulation.       Mobility Bed Mobility Overal bed mobility: Needs Assistance Bed Mobility: Sit to Supine       Sit to supine: Min guard (incrased time)   General bed mobility comments: pt requested to not have help to get back into bed; she was  close min guard for supine>sit with incrased time for pain management and the use of her BUE to manage her LLE back into the bed    Transfers Overall transfer level: Needs assistance Equipment used: Rolling walker (2 wheeled) Transfers: Sit to/from Stand Sit to Stand: Min guard         General transfer comment: Pt required cues for safety; she was observed attempting to "furniture cruise" throughout the room and required incrased cues to have BUE supported on RW during ambulation    Balance Overall balance assessment: Needs assistance Sitting-balance support: Feet supported Sitting balance-Leahy Scale: Good     Standing balance support: Bilateral upper extremity supported;During functional activity Standing balance-Leahy Scale: Poor        ADL either performed or assessed with clinical judgement   ADL Overall ADL's : Needs assistance/impaired       Functional mobility during ADLs: Min guard;Rolling walker;Cueing for safety;Cueing for sequencing General ADL Comments: Session focused on functional mobility tolerance with rw               Cognition Arousal/Alertness: Awake/alert Behavior During Therapy: WFL for tasks assessed/performed Overall Cognitive Status: Within Functional Limits for tasks assessed       General Comments: pt eager to get up and work with therapy today, pt in good spirits        Exercises Exercises: General Upper Extremity Total Joint Exercises Ankle Circles/Pumps: AROM;Both;20 reps;Supine Quad Sets: AROM;Left;10 reps;Supine Heel Slides: AAROM;Left;10 reps;Supine Hip ABduction/ADduction: AAROM;Left;10 reps;Supine Straight Leg Raises: AAROM;Left;10 reps;Supine Goniometric ROM: 0-60 grossly. General Exercises - Upper Extremity  Shoulder ABduction: AROM;Strengthening;Both;10 reps;Supine Theraband Level (Shoulder Abduction): Level 1 (Yellow) Elbow Flexion: AROM;Strengthening;Both;10 reps;Supine Theraband Level (Elbow Flexion): Level 1  (Yellow) Elbow Extension: AROM;Strengthening;Both;10 reps;Supine      General Comments no new impairments noted    Pertinent Vitals/ Pain       Pain Assessment: Faces Pain Score: 6  Faces Pain Scale: Hurts little more Pain Location: L knee Pain Descriptors / Indicators: Grimacing;Guarding;Tightness Pain Intervention(s): Limited activity within patient's tolerance;Monitored during session         Frequency  Min 2X/week        Progress Toward Goals  OT Goals(current goals can now be found in the care plan section)  Progress towards OT goals: Progressing toward goals  Acute Rehab OT Goals Patient Stated Goal: home OT Goal Formulation: With patient Time For Goal Achievement: 06/30/20 Potential to Achieve Goals: Good ADL Goals Pt Will Perform Grooming: with min assist;standing Pt Will Perform Lower Body Bathing: with min assist;sitting/lateral leans;sit to/from stand Pt Will Perform Lower Body Dressing: with min assist;sitting/lateral leans;sit to/from stand Pt Will Transfer to Toilet: with min assist;ambulating;regular height toilet;grab bars Pt Will Perform Toileting - Clothing Manipulation and hygiene: with min assist;sitting/lateral leans;sit to/from stand Pt Will Perform Tub/Shower Transfer: Tub transfer;Shower transfer;with min assist;ambulating;shower seat;grab bars;rolling walker Pt/caregiver will Perform Home Exercise Program: Increased strength;Both right and left upper extremity;With Supervision Additional ADL Goal #1: Pt will verbalize/demonstrate 3 fall prevention strategies to incorporate into ADLs/ADL mobility with min assist overall. Additional ADL Goal #2: Pt will verbalize/demonstrate WBAT LLE precautions and safe functional mobility transfers in order to participate in OOB ADLs with min assist overall.  Plan Discharge plan remains appropriate       AM-PAC OT "6 Clicks" Daily Activity     Outcome Measure   Help from another person eating meals?:  None Help from another person taking care of personal grooming?: A Little Help from another person toileting, which includes using toliet, bedpan, or urinal?: A Little Help from another person bathing (including washing, rinsing, drying)?: A Lot Help from another person to put on and taking off regular upper body clothing?: A Little Help from another person to put on and taking off regular lower body clothing?: A Little 6 Click Score: 18    End of Session Equipment Utilized During Treatment: Rolling walker  OT Visit Diagnosis: Unsteadiness on feet (R26.81);Muscle weakness (generalized) (M62.81);Pain Pain - Right/Left: Left Pain - part of body: Leg   Activity Tolerance Patient tolerated treatment well   Patient Left in bed;with call bell/phone within reach;with bed alarm set   Nurse Communication Mobility status        Time: 4492-0100 OT Time Calculation (min): 17 min  Charges: OT General Charges $OT Visit: 1 Visit OT Treatments $Therapeutic Activity: 8-22 mins   Jaala Bohle A Godwin Tedesco 06/26/2020, 3:45 PM

## 2020-06-26 NOTE — Progress Notes (Signed)
Physical Therapy Treatment Patient Details Name: Tracy Johnston MRN: [id number] DOB: 19-Mar-1950 Today's Date: 06/26/2020    History of Present Illness Pt is a 70 y/o female s/p L TKA. She also has a historyu of a hernia which the MD's belive is causing some of her gflank pain.  PMH includes COPD, cervical cancer, a fib, HTN, tobbacco use, and bilateral THA.    PT Comments    Pt seated in recliner.  Pt motivated to improve.  Good tolerance to re-introduction of L knee flexion.  Will continue to following during acute stay.     Follow Up Recommendations  SNF     Equipment Recommendations  Rolling walker with 5" wheels    Recommendations for Other Services       Precautions / Restrictions Precautions Precautions: Fall;Knee Precaution Booklet Issued: No Precaution Comments: Verbally reviewed knee precautions with pt. Restrictions Weight Bearing Restrictions: Yes LLE Weight Bearing: Weight bearing as tolerated Other Position/Activity Restrictions: 06/26/20 spoke with MD and patient able to perform full ROM to L knee.  NO Knee immobilizer needed for ambulation.    Mobility  Bed Mobility               General bed mobility comments: Pt seated in recliner on arrival this session.    Transfers Overall transfer level: Needs assistance Equipment used: Rolling walker (2 wheeled) Transfers: Sit to/from Stand Sit to Stand: Min guard         General transfer comment: Cues for hand placement and assistance to rise into standing.  Ambulation/Gait Ambulation/Gait assistance: Min guard;Min assist (increased assistance need as patient fatigues.) Gait Distance (Feet): 60 Feet Assistive device: Rolling walker (2 wheeled) Gait Pattern/deviations: Step-to pattern;Antalgic;Trunk flexed;Decreased stride length;Decreased stance time - left Gait velocity: decreased   General Gait Details: Cues for sequencing and upper trunk control.  Increased WOB breathing noted as gt distance  increased.   Stairs             Wheelchair Mobility    Modified Rankin (Stroke Patients Only)       Balance Overall balance assessment: Needs assistance Sitting-balance support: Feet supported;Bilateral upper extremity supported Sitting balance-Leahy Scale: Good       Standing balance-Leahy Scale: Fair                              Cognition Arousal/Alertness: Awake/alert Behavior During Therapy: WFL for tasks assessed/performed Overall Cognitive Status: Within Functional Limits for tasks assessed                                 General Comments: pt eager to get up and work with therapy today, pt in good spirits      Exercises Total Joint Exercises Ankle Circles/Pumps: AROM;Both;20 reps;Supine Quad Sets: AROM;Left;10 reps;Supine Heel Slides: AAROM;Left;10 reps;Supine Hip ABduction/ADduction: AAROM;Left;10 reps;Supine Straight Leg Raises: AAROM;Left;10 reps;Supine Goniometric ROM: 0-60 grossly. L knee.     General Comments        Pertinent Vitals/Pain Pain Assessment: 0-10 Pain Score: 6  Faces Pain Scale: Hurts whole lot Pain Location: L knee Pain Descriptors / Indicators: Grimacing;Guarding;Tightness Pain Intervention(s): Monitored during session;Repositioned    Home Living                      Prior Function            PT Goals (current goals can  now be found in the care plan section) Acute Rehab PT Goals Patient Stated Goal: home Potential to Achieve Goals: Fair Progress towards PT goals: Progressing toward goals    Frequency    7X/week      PT Plan Current plan remains appropriate    Co-evaluation              AM-PAC PT "6 Clicks" Mobility   Outcome Measure  Help needed turning from your back to your side while in a flat bed without using bedrails?: A Little Help needed moving from lying on your back to sitting on the side of a flat bed without using bedrails?: A Little Help needed  moving to and from a bed to a chair (including a wheelchair)?: A Little Help needed standing up from a chair using your arms (e.g., wheelchair or bedside chair)?: A Little Help needed to walk in hospital room?: A Little Help needed climbing 3-5 steps with a railing? : A Lot 6 Click Score: 17    End of Session Equipment Utilized During Treatment: Gait belt Activity Tolerance: Patient limited by pain;Patient limited by fatigue Patient left:  (passed to OT in standing during gt to make her way back to bed.) Nurse Communication: Mobility status PT Visit Diagnosis: Unsteadiness on feet (R26.81);Difficulty in walking, not elsewhere classified (R26.2);Pain;Muscle weakness (generalized) (M62.81) Pain - Right/Left: Left Pain - part of body: Knee     Time: 7517-0017 PT Time Calculation (min) (ACUTE ONLY): 15 min  Charges:  $Gait Training: 8-22 mins                     [name] , PTA Acute Rehabilitation Services Pager [phone] Office [place] 06/26/2020, 3:21 PM

## 2020-06-27 DIAGNOSIS — M1712 Unilateral primary osteoarthritis, left knee: Secondary | ICD-10-CM | POA: Diagnosis not present

## 2020-06-27 DIAGNOSIS — K5900 Constipation, unspecified: Secondary | ICD-10-CM

## 2020-06-27 DIAGNOSIS — R1011 Right upper quadrant pain: Secondary | ICD-10-CM | POA: Diagnosis not present

## 2020-06-27 DIAGNOSIS — Z96651 Presence of right artificial knee joint: Secondary | ICD-10-CM

## 2020-06-27 MED ORDER — POLYETHYLENE GLYCOL 3350 17 G PO PACK: 17.0000 g | PACK | Freq: Every day | ORAL | 0 refills | Status: AC

## 2020-06-27 MED ORDER — OXYMORPHONE HCL 10 MG PO TABS: 10.0000 mg | ORAL_TABLET | Freq: Three times a day (TID) | ORAL | 0 refills | Status: DC | PRN

## 2020-06-27 MED ORDER — RIVAROXABAN 20 MG PO TABS: 20.0000 mg | ORAL_TABLET | Freq: Every day | ORAL | 0 refills | Status: DC

## 2020-06-27 MED ORDER — AMOXICILLIN-POT CLAVULANATE 875-125 MG PO TABS: 1.0000 | ORAL_TABLET | Freq: Two times a day (BID) | ORAL | 0 refills | Status: AC

## 2020-06-27 NOTE — TOC Progression Note (Addendum)
Transition of Care [facility]) - Progression Note    Patient Details  Name: Tracy Johnston MRN: [id number] Date of Birth: [birth year]  [facility] CM/SW Contact  Milinda Antis, [place] Phone Number: 06/27/2020, 12:07 PM  Clinical Narrative:    CSW spoke with patient in reference to d/c needs.  The patient reports wanting to go home and not to a SNF.   CSW inquired about the patient's living situation and any assistance she received at home.  The patient reported having someone staying at night, patient's son can come by the home whenever needed, and having a [place] aide who comes to the home.    CSW called [place] agency at [phone] and spoke with Olivia Mackie.  CSW was informed that the patient has an aide who goes to her home 8 hours a day Monday- Friday.  This information was relayed to the care team.    14:30:  CSW met with patient at bedside.  Patient now reports that she will not have assistance overnight at home.  Patient now agreeable to SNF.   Insurance authorization re- submitted, pending approval.  Spoke with Mardene Celeste at [facility] [phone] ex. [phone].  The agency is willing to accept the patient once insurance approves.      Expected Discharge Plan: Skilled Nursing Facility Barriers to Discharge: Barriers Resolved  Expected Discharge Plan and Services Expected Discharge Plan: [place]   Discharge Planning Services: CM Consult Post Acute Care Choice: [place] arrangements for the past 2 months: Single Family Home                                       Social Determinants of Health (SDOH) Interventions    Readmission Risk Interventions No flowsheet data found.

## 2020-06-27 NOTE — Progress Notes (Addendum)
Physical Therapy Treatment Patient Details Name: Tracy Johnston MRN: [id number] DOB: [birth year] Today's Date: 06/27/2020    History of Present Illness Pt is a 70 y/o female s/p L TKA. She also has a historyu of a hernia which the MD's belive is causing some of her gflank pain.  PMH includes COPD, cervical cancer, a fib, HTN, tobbacco use, and bilateral THA.    PT Comments    Pt progressing well this session.  Overall requiring supervision.  Spoke with PA who is concerned about her returning home without 24 hr assistance, will defer d/c planning recommendations to surgeons recommendations at this time.  Will inform supervising PT of need for change in recommendations at this time.       Follow Up Recommendations  Follow surgeon's recommendation for DC plan and follow-up therapies     Equipment Recommendations  Rolling walker with 5" wheels    Recommendations for Other Services       Precautions / Restrictions Precautions Precautions: Fall;Knee Precaution Booklet Issued: No Precaution Comments: Verbally reviewed knee precautions with pt. Restrictions Weight Bearing Restrictions: Yes LLE Weight Bearing: Weight bearing as tolerated Other Position/Activity Restrictions: 06/26/20 PT spoke with MD and patient able to perform full ROM to L knee.  NO Knee immobilizer needed for ambulation.    Mobility  Bed Mobility               General bed mobility comments: seated in recliner.    Transfers Overall transfer level: Needs assistance Equipment used: Rolling walker (2 wheeled) Transfers: Sit to/from Stand Sit to Stand: Supervision         General transfer comment: Supervision for safety  Ambulation/Gait Ambulation/Gait assistance: Supervision Gait Distance (Feet): 50 Feet Assistive device: Rolling walker (2 wheeled) Gait Pattern/deviations: Step-to pattern;Antalgic;Trunk flexed;Decreased stride length;Decreased stance time - left Gait velocity: decreased   General Gait  Details: Cues for sequencing and upper trunk control.  Increased WOB breathing noted as gt distance increased.  Edcuated to continue to walk 3x daily at home to improve endurance.   Stairs Stairs: Yes Stairs assistance: Min guard Stair Management: No rails;Forwards;With walker Number of Stairs: 1 General stair comments: Cues for sequencing and RW placement.   Wheelchair Mobility    Modified Rankin (Stroke Patients Only)       Balance Overall balance assessment: Needs assistance Sitting-balance support: Feet supported Sitting balance-Leahy Scale: Good       Standing balance-Leahy Scale: Poor                              Cognition Arousal/Alertness: Awake/alert Behavior During Therapy: WFL for tasks assessed/performed Overall Cognitive Status: Within Functional Limits for tasks assessed                                 General Comments: pt eager to get up and work with therapy today, pt in good spirits      Exercises Total Joint Exercises Ankle Circles/Pumps: AROM;Both;20 reps;Supine Quad Sets: AROM;Left;10 reps;Supine Heel Slides: AAROM;Left;10 reps;Supine Hip ABduction/ADduction: AAROM;Left;10 reps;Supine Straight Leg Raises: AAROM;Left;10 reps;Supine Goniometric ROM: 6-69 L knee.    General Comments        Pertinent Vitals/Pain Pain Assessment: Faces Faces Pain Scale: Hurts little more Pain Location: L knee Pain Descriptors / Indicators: Grimacing;Guarding;Tightness Pain Intervention(s): Monitored during session;Repositioned    Home Living  Prior Function            PT Goals (current goals can now be found in the care plan section) Acute Rehab PT Goals Potential to Achieve Goals: Fair Progress towards PT goals: Progressing toward goals    Frequency    7X/week      PT Plan Discharge plan needs to be updated    Co-evaluation              AM-PAC PT "6 Clicks" Mobility   Outcome  Measure  Help needed turning from your back to your side while in a flat bed without using bedrails?: A Little Help needed moving from lying on your back to sitting on the side of a flat bed without using bedrails?: A Little Help needed moving to and from a bed to a chair (including a wheelchair)?: A Little Help needed standing up from a chair using your arms (e.g., wheelchair or bedside chair)?: A Little Help needed to walk in hospital room?: A Little Help needed climbing 3-5 steps with a railing? : A Little 6 Click Score: 18    End of Session Equipment Utilized During Treatment: Gait belt Activity Tolerance: Patient limited by pain;Patient limited by fatigue Patient left: in chair;with call bell/phone within reach Nurse Communication: Mobility status PT Visit Diagnosis: Unsteadiness on feet (R26.81);Difficulty in walking, not elsewhere classified (R26.2);Pain;Muscle weakness (generalized) (M62.81) Pain - Right/Left: Left Pain - part of body: Knee     Time: 6440-3474 PT Time Calculation (min) (ACUTE ONLY): 29 min  Charges:  $Gait Training: 8-22 mins $Therapeutic Exercise: 8-22 mins                     [name] , PTA Acute Rehabilitation Services Pager [phone] Office [phone]     Tracy Johnston 06/27/2020, 3:21 PM

## 2020-06-27 NOTE — Care Management Important Message (Signed)
Important Message  Patient Details  Name: Cleaster Shiffer MRN: [id number] Date of Birth: [birth year]   Medicare Important Message Given:  Yes     Barb Merino [place] 06/27/2020, 12:57 PM

## 2020-06-27 NOTE — Progress Notes (Addendum)
Subjective: I spoke to the patient, Education officer, museum and physical therapist regarding discharge arrangements.  The plan has been for patient to discharge to a skilled nursing facility and these were the recommendations by OT and PT yesterday afternoon.  Hospitalist as stated patient is stable from their standpoint to leave on p.o. antibiotics.  Patient states that she has a home health aide that comes out 8 hours/day.  After speaking with her she will be alone at home for the remaining 16 hours.  Patient stated that her son cannot guarantee that he will be around and patient and her son agree that it is in her best interest to discharge to SNF.     Objective: Vital signs in last 24 hours: Temp:  [97.6 F (36.4 C)-99.4 F (37.4 C)] 98.7 F (37.1 C) (06/01 1412) Pulse Rate:  [77-102] 77 (06/01 1412) Resp:  [16-20] 20 (06/01 1412) BP: (106-117)/(60-72) 106/63 (06/01 1412) SpO2:  [90 %-94 %] 94 % (06/01 1412)  Intake/Output from previous day: No intake/output data recorded. Intake/Output this shift: Total I/O In: 600 [P.O.:600] Out: -   Recent Labs    06/25/20 0110 06/26/20 0709  HGB 11.3* 11.7*   Recent Labs    06/25/20 0110 06/26/20 0709  WBC 23.0* 17.3*  RBC 3.63* 3.76*  HCT 32.2* 33.3*  PLT 373 435*   Recent Labs    06/25/20 0110  NA 138  K 3.9  CL 101  CO2 27  BUN 25*  CREATININE 1.24*  GLUCOSE 151*  CALCIUM 9.3   No results for input(s): LABPT, INR in the last 72 hours.  Exam Very pleasant black female alert and oriented in no acute distress.  Left knee wound looks good.  Staples intact.  No drainage or signs of infection.  Calf nontender.    Assessment/Plan: There was some discussion about patient discharging home today.  Long discussion with patient and she states that she will not have assistance for a good portion of the day.  Patient also states that her son agrees that it would be in his mom's best interest to discharge to the skilled facility.  Has a home  health aide only 8 hours/day.  Patient has been followed by hospitalist and treated for pneumonia.  I do think it would be in patient's best interest to proceed with discharge to skilled facility.  I discussed this with Education officer, museum and my supervising physician Dr. Rodell Perna.   Benjiman Core 06/27/2020, 2:55 PM

## 2020-06-27 NOTE — Progress Notes (Addendum)
[facility] consult progress note                                                                               Patient Demographics  Tracy Johnston, is a 70 y.o. female, DOB - September 08, 1950, XVQ:[id number]  Admit date - 06/15/2020   Admitting Physician [name], MD  Outpatient Primary MD for the patient is Leeanne Rio, MD  Outpatient specialists:   LOS - 10  days   Medical records reviewed and are as summarized below:    No chief complaint on file.      Brief summary   Patient is a 70 year old female with COPD, hypertension, A. fib, asthma, gout was admitted under orthopedic surgery, underwent right total left knee replacement.  Patient was waiting for discharge to SNF on 5/25 when started having pain on the right side of the chest, shortness of breath.  [facility] was consulted Underwent extensive work-up including negative serial troponins, negative procalcitonin.  D-dimer was elevated hence underwent VQ scan low probability for PE, lower extremity venous Dopplers negative for DVT CT abdomen pelvis showed patchy area of groundglass and streaky density at the right lung base concerning for pneumonia.  No hydronephrosis or nephrolithiasis.  No acute intra-abdominal or pelvic pathology.  Broad-based right lateral hernia containing a segment of colon. Abdominal ultrasound showed biliary sludge without evidence of acute cholecystitis.  Patient was evaluated by general surgery.  Recommended binder to help mobilize with hernia, no cholecystitis , no gallbladder removal during hospitalization.  Recommended bowel regimen, increase MiraLAX.  Outpatient follow-up Patient was on prednisone, which was discontinued on 5/30, received 40 mg daily on 5/29 and 5/30.  Leukocytosis trending down Patient has received 7 doses of IV Rocephin, day #7 on 6/1  Assessment & Plan    Left knee osteoarthritis -Status post left knee replacement, continue management per orthopedics  Right  posterior chest pain, right lateral abdominal pain -Extensive work-up as above, negative except possible pneumonia.  Likely musculoskeletal versus pneumonia. -Patient is on day # 7 of IV Rocephin today.  Afebrile. - Patient was on prednisone which was discontinued on 5/30 -Leukocytosis improving.  If patient is being discharged, will continue oral antibiotics for 3 more days I will continue IV Rocephin while inpatient.   -No need of IV antibiotics upon discharge.  COPD -Currently stable, continue bronchodilators -No wheezing, oral prednisone was discontinued on 5/30  Hypertension -BP stable, no acute issues  Hyperlipidemia -Continue simvastatin  Atrial fibrillation -Currently heart rate controlled on Cardizem, continue Xarelto  Gout -Continue allopurinol  Obesity Estimated body mass index is 35.19 kg/m as calculated from the following:   Height as of this encounter: 5\' 4"  (1.626 m).   Weight as of this encounter: 93 kg.  Code Status: Full CODE STATUS DVT Prophylaxis:  SCDs Start: 06/15/20 1222 rivaroxaban (XARELTO) tablet 20 mg   Level of Care: Level of care: Med-Surg Family Communication: Discussed all imaging results, lab results, explained to the patient    Disposition Plan:     Status is: Inpatient.  Per orthopedics.  Patient is medically stable for discharge. If discharging today, will send 3  days of oral antibiotics (sent Augmentin to patient's pharmacy for 3 days).     Time Spent in minutes 35 minutes  Procedures:  None  Antimicrobials:   Anti-infectives (From admission, onward)   Start     Dose/Rate Route Frequency Ordered Stop   06/26/20 1200  cefTRIAXone (ROCEPHIN) 2 g in sodium chloride 0.9 % 100 mL IVPB        2 g 200 mL/hr over 30 Minutes Intravenous Every 24 hours 06/26/20 1101 07/01/20 1159   06/21/20 2000  cefTRIAXone (ROCEPHIN) 2 g in sodium chloride 0.9 % 100 mL IVPB        2 g 200 mL/hr over 30 Minutes Intravenous Every 24 hours 06/21/20 1857  06/25/20 2049   06/21/20 2000  azithromycin (ZITHROMAX) tablet 500 mg        500 mg Oral Daily 06/21/20 1857 06/23/20 0939   06/15/20 0600  ceFAZolin (ANCEF) IVPB 2g/100 mL premix        2 g 200 mL/hr over 30 Minutes Intravenous On call to O.R. 06/15/20 0549 06/15/20 0746          Medications  Scheduled Meds: . allopurinol  300 mg Oral Daily  . diltiazem  300 mg Oral Daily  . docusate sodium  100 mg Oral BID  . ferrous sulfate  325 mg Oral TID PC  . fluticasone furoate-vilanterol  1 puff Inhalation Daily  . hydrochlorothiazide  12.5 mg Oral Daily  . irbesartan  300 mg Oral Daily  . lidocaine  1 patch Transdermal Q24H  . mirabegron ER  50 mg Oral Daily  . montelukast  10 mg Oral QHS  . polyethylene glycol  17 g Oral Daily  . rivaroxaban  20 mg Oral Q supper  . simvastatin  20 mg Oral Daily  . umeclidinium bromide  1 puff Inhalation Daily   Continuous Infusions: . sodium chloride Stopped (06/16/20 1200)  . cefTRIAXone (ROCEPHIN)  IV 2 g (06/27/20 1115)   PRN Meds:.acetaminophen, albuterol, diphenhydrAMINE, HYDROcodone-acetaminophen, ipratropium-albuterol, LORazepam, menthol-cetylpyridinium **OR** phenol, metoprolol tartrate, morphine, ondansetron **OR** ondansetron (ZOFRAN) IV      Subjective:   Tracy Johnston was seen and examined today.  Continues to complain of pleuritic and musculoskeletal pain on the right lower posterior chest and hip, worse with breathing and position.  No fevers, acute shortness of breath.  Patient denies dizziness, abdominal pain, nausea or vomiting.  No fevers  Objective:   Vitals:   06/26/20 1258 06/26/20 2001 06/27/20 0507 06/27/20 0753  BP: 111/69 112/60 117/68 113/72  Pulse: 71 (!) 102 96 81  Resp: 18 16 20 20   Temp: 97.7 F (36.5 C) 97.6 F (36.4 C) 99.4 F (37.4 C) 98.9 F (37.2 C)  TempSrc: Oral Oral Oral Oral  SpO2: 92% 90% 91% 92%  Weight:      Height:       No intake or output data in the 24 hours ending 06/27/20  1147   Wt Readings from Last 3 Encounters:  06/15/20 93 kg  06/13/20 93.2 kg  06/07/20 91.6 kg     Exam  General: Alert and oriented x 3, NAD  Cardiovascular: S1 S2 auscultated  Respiratory: Diminished at the bases, no wheezing  Gastrointestinal: Soft, nontender, nondistended, + bowel sounds  Ext: no pedal edema bilaterally  Neuro: no new deficits  Musculoskeletal: No digital cyanosis, clubbing  Skin: No rashes  Psych: Normal affect and demeanor, alert and oriented x3    Data Reviewed:  I have personally reviewed following labs  and imaging studies  Micro Results Recent Results (from the past 240 hour(s))  SARS CORONAVIRUS 2 (TAT 6-24 HRS) Nasopharyngeal Nasopharyngeal Swab     Status: None   Collection Time: 06/20/20  1:34 PM   Specimen: Nasopharyngeal Swab  Result Value Ref Range Status   SARS Coronavirus 2 NEGATIVE NEGATIVE Final    Comment: (NOTE) SARS-CoV-2 target nucleic acids are NOT DETECTED.  The SARS-CoV-2 RNA is generally detectable in upper and lower respiratory specimens during the acute phase of infection. Negative results do not preclude SARS-CoV-2 infection, do not rule out co-infections with other pathogens, and should not be used as the sole basis for treatment or other patient management decisions. Negative results must be combined with clinical observations, patient history, and epidemiological information. The expected result is Negative.  Fact Sheet for Patients: SugarRoll.be  Fact Sheet for Healthcare Providers: https://www.woods-mathews.com/  This test is not yet approved or cleared by the [place] FDA and  has been authorized for detection and/or diagnosis of SARS-CoV-2 by FDA under an Emergency Use Authorization (EUA). This EUA will remain  in effect (meaning this test can be used) for the duration of the COVID-19 declaration under Se ction 564(b)(1) of the Act, 21 U.S.C. section  360bbb-3(b)(1), unless the authorization is terminated or revoked sooner.  Performed at [facility], [place] [address], [place], [place] [address]     Radiology Reports CT ABDOMEN PELVIS WO CONTRAST  Result Date: 06/22/2020 CLINICAL DATA:  70 year old female with abdominal pain. Concern for hernia. EXAM: CT ABDOMEN AND PELVIS WITHOUT CONTRAST TECHNIQUE: Multidetector CT imaging of the abdomen and pelvis was performed following the standard protocol without IV contrast. COMPARISON:  Abdominal radiograph dated 06/21/2020. CT abdomen pelvis dated 09/13/2012. FINDINGS: Evaluation of this exam is limited in the absence of intravenous contrast as well as due to respiratory motion artifact. Lower chest: Patchy area of ground-glass and streaky density at the right lung base concerning for pneumonia. Clinical correlation is recommended. There is mild cardiomegaly. No intra-abdominal free air or free fluid. Hepatobiliary: The liver is unremarkable. No intrahepatic biliary ductal dilatation. No calcified gallstone or pericholecystic fluid. Pancreas: Unremarkable. No pancreatic ductal dilatation or surrounding inflammatory changes. Spleen: Normal in size without focal abnormality. Adrenals/Urinary Tract: The adrenal glands are unremarkable. Status post prior right nephrectomy. There is no hydronephrosis or nephrolithiasis on the left. The left ureter and urinary bladder appear unremarkable. Stomach/Bowel: There is sigmoid diverticulosis and diffuse colonic diverticula without active inflammatory changes. There is no bowel obstruction or active inflammation. The appendix is normal. Vascular/Lymphatic: Advanced aortoiliac atherosclerotic disease. The IVC is unremarkable. No portal venous gas. There is no adenopathy. Reproductive: The uterus is anteverted. There is a 2 cm calcified fibroid. No adnexal masses. Other: There is a broad-based right lateral hernia containing segment of the colon. Musculoskeletal:  Osteopenia with degenerative changes of the spine. The disc a shin and vacuum phenomena at L4-L5. No acute osseous pathology. Bilateral hip arthroplasties. IMPRESSION: 1. No acute intra-abdominal or pelvic pathology. 2. Colonic diverticulosis. No bowel obstruction. Normal appendix. 3. Broad-based right lateral hernia containing a segment of the colon. 4. Status post prior right nephrectomy. 5. Aortic Atherosclerosis (ICD10-I70.0). Electronically Signed   By: Anner Crete M.D.   On: 06/22/2020 23:34   DG Chest 2 View  Result Date: 06/21/2020 CLINICAL DATA:  Wheezing. EXAM: CHEST - 2 VIEW COMPARISON:  Jun 13, 2020. FINDINGS: Stable cardiomegaly. No pneumothorax is noted. Stable bibasilar interstitial opacities are noted concerning for scarring, although  acute superimposed edema or inflammation cannot be excluded. Bony thorax is unremarkable. IMPRESSION: Stable bibasilar interstitial opacities are noted concerning for scarring, although superimposed acute edema or inflammation cannot be excluded. Electronically Signed   By: Marijo Conception M.D.   On: 06/21/2020 15:18   DG Chest 2 View  Result Date: 06/15/2020 CLINICAL DATA:  70 year old female preoperative exam. Left knee surgery. Former smoker. EXAM: CHEST - 2 VIEW COMPARISON:  CT Abdomen and Pelvis 09/13/2012. Chest radiographs 12/29/2011. FINDINGS: Moderate to severe cardiomegaly appears progressed since 2014. Other mediastinal contours are within normal limits. Visualized tracheal air column is within normal limits. Chronically large lung volumes. Chronic increased interstitial opacity in both lungs has only mildly progressed since 2013. no pneumothorax, pleural effusion or confluent pulmonary opacity. Previous left humerus ORIF. No acute osseous abnormality identified. Stable cholecystectomy clips. Negative visible bowel gas pattern. IMPRESSION: 1. Moderate to severe cardiomegaly has progressed since 2014. 2. Probable smoking related chronic pulmonary  hyperinflation and increased interstitial markings. 3. No superimposed acute cardiopulmonary abnormality. Electronically Signed   By: Genevie Ann M.D.   On: 06/15/2020 06:53   DG Abd 1 View  Result Date: 06/21/2020 CLINICAL DATA:  Constipation EXAM: ABDOMEN - 1 VIEW COMPARISON:  02/08/2018 FINDINGS: Streaky left lung base opacity. Surgical clips in the right upper quadrant. Nonobstructed bowel-gas pattern with mild stool. Calcified fibroid in the pelvis IMPRESSION: Negative. Electronically Signed   By: Donavan Foil M.D.   On: 06/21/2020 18:40   NM Pulmonary Perf and Vent  Result Date: 06/22/2020 CLINICAL DATA:  Chest pain, shortness of breath EXAM: NUCLEAR MEDICINE PERFUSION LUNG SCAN TECHNIQUE: Perfusion images were obtained in multiple projections after intravenous injection of radiopharmaceutical. Ventilation scans intentionally deferred if perfusion scan and chest x-ray adequate for interpretation during COVID 19 epidemic. RADIOPHARMACEUTICALS:  4.2 mCi Tc-73m MAA IV COMPARISON:  Chest x-ray 06/21/2020 FINDINGS: Slightly heterogeneous accumulation of radiotracer throughout the bilateral lung fields. No segmental perfusion defects. Prominent area of central photopenia compatible with cardiomegaly. IMPRESSION: Low probability of pulmonary embolism. Electronically Signed   By: Davina Poke D.O.   On: 06/22/2020 15:07   VAS [place] LOWER EXTREMITY VENOUS (DVT)  Result Date: 06/20/2020  Lower Venous DVT Study Patient Name:  CAITLYNE INGHAM  Date of Exam:   06/20/2020 Medical Rec #: [id number]     Accession #:    [id number] Date of Birth: 1950/06/08     Patient Gender: F Patient Age:   69Y Exam Location:  [facility] Procedure:      VAS [place] LOWER EXTREMITY VENOUS (DVT) Referring Phys: [id number] Alyson Locket OWENS --------------------------------------------------------------------------------  Indications: Erythema and calf swelling s/p LT arthroplasty 06/15/20.  Limitations: Poor ultrasound/tissue interface and  patient pain tolerance. Comparison Study: No prior studies. Performing Technologist: Darlin Coco RDMS,RVT  Examination Guidelines: A complete evaluation includes B-mode imaging, spectral Doppler, color Doppler, and power Doppler as needed of all accessible portions of each vessel. Bilateral testing is considered an integral part of a complete examination. Limited examinations for reoccurring indications may be performed as noted. The reflux portion of the exam is performed with the patient in reverse Trendelenburg.  +-----+---------------+---------+-----------+----------+--------------+ RIGHTCompressibilityPhasicitySpontaneityPropertiesThrombus Aging +-----+---------------+---------+-----------+----------+--------------+ CFV  Full           Yes      Yes                                 +-----+---------------+---------+-----------+----------+--------------+   +---------+---------------+---------+-----------+----------+--------------+ LEFT  CompressibilityPhasicitySpontaneityPropertiesThrombus Aging +---------+---------------+---------+-----------+----------+--------------+ CFV      Full           Yes      Yes                                 +---------+---------------+---------+-----------+----------+--------------+ SFJ      Full                                                        +---------+---------------+---------+-----------+----------+--------------+ FV Prox  Full                                                        +---------+---------------+---------+-----------+----------+--------------+ FV Mid   Full                                                        +---------+---------------+---------+-----------+----------+--------------+ FV Distal               Yes      Yes                                 +---------+---------------+---------+-----------+----------+--------------+ PFV      Full                                                         +---------+---------------+---------+-----------+----------+--------------+ POP      Full           Yes      Yes                                 +---------+---------------+---------+-----------+----------+--------------+ PTV      Full                                                        +---------+---------------+---------+-----------+----------+--------------+ PERO     Full                                                        +---------+---------------+---------+-----------+----------+--------------+     Summary: RIGHT: - No evidence of common femoral vein obstruction.  LEFT: - There is no evidence of deep vein thrombosis in the lower extremity. However, portions of this examination were limited- see technologist comments above.  - No cystic structure found in the popliteal fossa.  *See table(s) above  for measurements and observations. Electronically signed by Harold Barban MD on 06/20/2020 at 9:21:39 PM.    Final    US Abdomen Limited RUQ (LIVER/GB)  Result Date: 06/24/2020 CLINICAL DATA:  RIGHT upper quadrant pain EXAM: ULTRASOUND ABDOMEN LIMITED RIGHT UPPER QUADRANT COMPARISON:  06/21/2020 FINDINGS: Gallbladder: There is biliary sludge. Gallbladder wall thickness is normal. No pericholecystic fluid. No sonographic Murphy sign noted by sonographer. Common bile duct: Diameter: 6 mm, normal Liver: No focal lesion identified. Within normal limits in parenchymal echogenicity. Portal vein is patent on color Doppler imaging with normal direction of blood flow towards the liver. Other: Status post RIGHT nephrectomy. IMPRESSION: No sonographic etiology for RIGHT upper quadrant pain identified. There is biliary sludge without evidence of acute cholecystitis. Electronically Signed   By: Valentino Saxon MD   On: 06/24/2020 17:48    Lab Data:  CBC: Recent Labs  Lab 06/21/20 1629 06/22/20 1330 06/23/20 0115 06/24/20 1358 06/25/20 0110 06/26/20 0709  WBC 17.9* 20.0* 19.4* 26.1*  23.0* 17.3*  NEUTROABS 16.3*  --   --  24.3* 20.1* 14.3*  HGB 12.7 11.7* 11.9* 13.1 11.3* 11.7*  HCT 36.1 34.1* 33.9* 37.7 32.2* 33.3*  MCV 88.5 89.7 89.2 90.0 88.7 88.6  PLT 257 278 288 407* 373 062*   Basic Metabolic Panel: Recent Labs  Lab 06/22/20 1330 06/23/20 0115 06/24/20 1358 06/25/20 0110  NA 139 141 139 138  K 3.3* 4.0 3.8 3.9  CL 102 105 98 101  CO2 29 24 28 27   GLUCOSE 112* 97 112* 151*  BUN 18 17 20  25*  CREATININE 1.15* 1.10* 1.36* 1.24*  CALCIUM 9.0 9.2 9.7 9.3  MG  --   --  2.2  --   PHOS  --   --   --  3.2   GFR: Estimated Creatinine Clearance: 46 mL/min (A) (by C-G formula based on SCr of 1.24 mg/dL (H)). Liver Function Tests: Recent Labs  Lab 06/22/20 1330 06/23/20 0115 06/24/20 1358 06/25/20 0110  AST 19 16 25   --   ALT 13 15 20   --   ALKPHOS 72 75 104  --   BILITOT 1.1 0.8 1.1  --   PROT 6.2* 6.1* 7.5  --   ALBUMIN 2.2* 2.2* 2.4* 2.0*   Recent Labs  Lab 06/22/20 1330  LIPASE 22  AMYLASE 58   No results for input(s): AMMONIA in the last 168 hours. Coagulation Profile: No results for input(s): INR, PROTIME in the last 168 hours. Cardiac Enzymes: No results for input(s): CKTOTAL, CKMB, CKMBINDEX, TROPONINI in the last 168 hours. BNP (last 3 results) No results for input(s): PROBNP in the last 8760 hours. HbA1C: No results for input(s): HGBA1C in the last 72 hours. CBG: No results for input(s): GLUCAP in the last 168 hours. Lipid Profile: No results for input(s): CHOL, HDL, LDLCALC, TRIG, CHOLHDL, LDLDIRECT in the last 72 hours. Thyroid Function Tests: No results for input(s): TSH, T4TOTAL, FREET4, T3FREE, THYROIDAB in the last 72 hours. Anemia Panel: Recent Labs    06/24/20 1358  TIBC 252  IRON 35   Urine analysis:    Component Value Date/Time   COLORURINE YELLOW 06/13/2020 [place] 06/13/2020 1056   LABSPEC 1.025 06/13/2020 1056   PHURINE 6.0 06/13/2020 [place] 06/13/2020 [place] 06/13/2020 [place] 06/13/2020 [place] 06/13/2020 1056   PROTEINUR 100 (A) 06/13/2020 1056   UROBILINOGEN 0.2 12/29/2011 1543  NITRITE NEGATIVE 06/13/2020 [place] 06/13/2020 1056     Zlata Alcaide M.D. [facility] 06/27/2020, 11:47 AM  Available via Epic secure chat 7am-7pm After 7 pm, please refer to night coverage provider listed on amion.

## 2020-06-27 NOTE — Progress Notes (Addendum)
Would be at home without help extended periods  and SNF needed.

## 2020-06-28 ENCOUNTER — Encounter: Payer: Medicare HMO | Admitting: Orthopaedic Surgery

## 2020-06-28 DIAGNOSIS — M1712 Unilateral primary osteoarthritis, left knee: Secondary | ICD-10-CM | POA: Diagnosis not present

## 2020-06-28 DIAGNOSIS — Z96651 Presence of right artificial knee joint: Secondary | ICD-10-CM | POA: Diagnosis not present

## 2020-06-28 DIAGNOSIS — R1011 Right upper quadrant pain: Secondary | ICD-10-CM | POA: Diagnosis not present

## 2020-06-28 LAB — SARS CORONAVIRUS 2 (TAT 6-24 HRS): SARS Coronavirus 2: NEGATIVE

## 2020-06-28 NOTE — Progress Notes (Signed)
Physical Therapy Treatment Patient Details Name: Tracy Johnston MRN: [id number] DOB: 1950/09/25 Today's Date: 06/28/2020    History of Present Illness Pt is a 70 y/o female s/p L TKA. She also has a historyu of a hernia which the MD's belive is causing some of her gflank pain.  PMH includes COPD, cervical cancer, a fib, HTN, tobbacco use, and bilateral THA.    PT Comments    Pt seated in recliner.  Pt very excited about d/c home.  Pt is now independent with transfers.  She required supervision for gt to correct gt deviation.  Pt waiting for her ride to d/c home today.     Follow Up Recommendations  Follow surgeon's recommendation for DC plan and follow-up therapies     Equipment Recommendations  Rolling walker with 5" wheels    Recommendations for Other Services       Precautions / Restrictions Precautions Precautions: Fall;Knee Precaution Booklet Issued: No Precaution Comments: Verbally reviewed knee precautions with pt. Restrictions Weight Bearing Restrictions: Yes LLE Weight Bearing: Weight bearing as tolerated Other Position/Activity Restrictions: 06/26/20 PT spoke with MD and patient able to perform full ROM to L knee.  NO Knee immobilizer needed for ambulation.    Mobility  Bed Mobility Overal bed mobility: Needs Assistance             General bed mobility comments: pt up in chair upon arrival    Transfers Overall transfer level: Modified independent Equipment used: Rolling walker (2 wheeled) Transfers: Sit to/from Stand Sit to Stand: Modified independent (Device/Increase time)         General transfer comment: supervision for safety  Ambulation/Gait Ambulation/Gait assistance: Supervision Gait Distance (Feet): 15 Feet (limited to gt in room as she was to d/c shortly and wanted to conserve energy.) Assistive device: Rolling walker (2 wheeled) Gait Pattern/deviations: Step-to pattern;Antalgic;Trunk flexed;Decreased stride length;Decreased stance time -  left     General Gait Details: Cues for R foot clearance and safety.   Stairs             Wheelchair Mobility    Modified Rankin (Stroke Patients Only)       Balance Overall balance assessment: Needs assistance Sitting-balance support: Feet supported Sitting balance-Leahy Scale: Normal     Standing balance support: Bilateral upper extremity supported;During functional activity Standing balance-Leahy Scale: Fair                              Cognition Arousal/Alertness: Awake/alert Behavior During Therapy: WFL for tasks assessed/performed Overall Cognitive Status: Within Functional Limits for tasks assessed                                 General Comments: pt excited to go home today      Exercises Total Joint Exercises Ankle Circles/Pumps: AROM;Both;20 reps;Supine Quad Sets: AROM;Left;10 reps;Supine Heel Slides: AAROM;Left;Supine;5 reps Hip ABduction/ADduction: AAROM;Left;Supine;5 reps Straight Leg Raises: AAROM;Left;Supine;5 reps Goniometric ROM: 6-70 L knee    General Comments General comments (skin integrity, edema, etc.): pt sx bandage dry and intact; dressed upon arival; with reports of bathing and grooming at the sink this morning indep; no new concers noted      Pertinent Vitals/Pain Pain Assessment: 0-10 Pain Score: 6  Faces Pain Scale: Hurts little more Pain Location: L knee Pain Descriptors / Indicators: Grimacing;Guarding Pain Intervention(s): Monitored during session;Repositioned    Home Living  Prior Function            PT Goals (current goals can now be found in the care plan section) Acute Rehab PT Goals Patient Stated Goal: home Potential to Achieve Goals: Fair Progress towards PT goals: Progressing toward goals    Frequency           PT Plan Current plan remains appropriate    Co-evaluation              AM-PAC PT "6 Clicks" Mobility   Outcome Measure   Help needed turning from your back to your side while in a flat bed without using bedrails?: None Help needed moving from lying on your back to sitting on the side of a flat bed without using bedrails?: None Help needed moving to and from a bed to a chair (including a wheelchair)?: None Help needed standing up from a chair using your arms (e.g., wheelchair or bedside chair)?: None Help needed to walk in hospital room?: A Little Help needed climbing 3-5 steps with a railing? : A Little 6 Click Score: 22    End of Session Equipment Utilized During Treatment: Gait belt Activity Tolerance: Patient limited by pain;Patient limited by fatigue Patient left: in chair;with call bell/phone within reach Nurse Communication: Mobility status PT Visit Diagnosis: Unsteadiness on feet (R26.81);Difficulty in walking, not elsewhere classified (R26.2);Pain;Muscle weakness (generalized) (M62.81)     Time: 6599-3570 PT Time Calculation (min) (ACUTE ONLY): 14 min  Charges:  $Gait Training: 8-22 mins                     [name] , PTA Acute Rehabilitation Services Pager [phone] Office [phone]     Essence Merle [name] 06/28/2020, 2:32 PM

## 2020-06-28 NOTE — Progress Notes (Signed)
[facility] consult progress note                                                                               Patient Demographics  Tracy Johnston, is a 70 y.o. female, DOB - 01-Nov-1950, BLT:[id number]  Admit date - 06/15/2020   Admitting Physician [name], MD  Outpatient Primary MD for the patient is Leeanne Rio, MD  Outpatient specialists:   LOS - 11  days   Medical records reviewed and are as summarized below:    No chief complaint on file.      Brief summary   Patient is a 70 year old female with COPD, hypertension, A. fib, asthma, gout was admitted under orthopedic surgery, underwent right total left knee replacement.  Patient was waiting for discharge to SNF on 5/25 when started having pain on the right side of the chest, shortness of breath.  TR[facility] was consulted Underwent extensive work-up including negative serial troponins, negative procalcitonin.  D-dimer was elevated hence underwent VQ scan low probability for PE, lower extremity venous Dopplers negative for DVT CT abdomen pelvis showed patchy area of groundglass and streaky density at the right lung base concerning for pneumonia.  No hydronephrosis or nephrolithiasis.  No acute intra-abdominal or pelvic pathology.  Broad-based right lateral hernia containing a segment of colon. Abdominal ultrasound showed biliary sludge without evidence of acute cholecystitis.  Patient was evaluated by general surgery.  Recommended binder to help mobilize with hernia, no cholecystitis , no gallbladder removal during hospitalization.  Recommended bowel regimen, increase MiraLAX.  Outpatient follow-up Patient was on prednisone, which was discontinued on 5/30, received 40 mg daily on 5/29 and 5/30.  Leukocytosis trending down Patient has received 7 doses of IV Rocephin, day #8 on 6/2  Assessment & Plan    Left knee osteoarthritis -Status post left knee replacement, continue management per orthopedics  Right  posterior chest pain, right lateral abdominal pain -Extensive work-up as above, negative except possible pneumonia.  Likely musculoskeletal versus pneumonia. - Patient was on prednisone which was discontinued on 5/30 -Leukocytosis improving.  Continue IV Rocephin while inpatient, no need of IV antibiotics upon discharge.   -I sent a prescription for oral Augmentin to her pharmacy for 3 days to complete full course  COPD -Currently stable, continue bronchodilators -No wheezing, oral prednisone was discontinued on 5/30  Hypertension -BP stable, no acute issues  Hyperlipidemia -Continue statin  Atrial fibrillation -HR controlled, continue Cardizem, Xarelto  Gout -Continue allopurinol  Obesity Estimated body mass index is 35.19 kg/m as calculated from the following:   Height as of this encounter: 5\' 4"  (1.626 m).   Weight as of this encounter: 93 kg.  Code Status: Full CODE STATUS DVT Prophylaxis:  SCDs Start: 06/15/20 1222 rivaroxaban (XARELTO) tablet 20 mg   Level of Care: Level of care: Med-Surg Family Communication: Discussed all imaging results, lab results, explained to the patient    Disposition Plan:     Status is: Inpatient.  Per orthopedics.  Patient is medically stable for discharge (sent Augmentin to patient's pharmacy for 3 days).  Per orthopedics, patient is discharging home today, insurance has declined  SNF.  I will sign off.   Time Spent in minutes 25 minutes  Procedures:  None  Antimicrobials:   Anti-infectives (From admission, onward)   Start     Dose/Rate Route Frequency Ordered Stop   06/28/20 0000  amoxicillin-clavulanate (AUGMENTIN) 875-125 MG tablet        1 tablet Oral 2 times daily 06/27/20 1243 07/01/20 2359   06/26/20 1200  cefTRIAXone (ROCEPHIN) 2 g in sodium chloride 0.9 % 100 mL IVPB        2 g 200 mL/hr over 30 Minutes Intravenous Every 24 hours 06/26/20 1101 07/01/20 1159   06/21/20 2000  cefTRIAXone (ROCEPHIN) 2 g in sodium chloride  0.9 % 100 mL IVPB        2 g 200 mL/hr over 30 Minutes Intravenous Every 24 hours 06/21/20 1857 06/25/20 2049   06/21/20 2000  azithromycin (ZITHROMAX) tablet 500 mg        500 mg Oral Daily 06/21/20 1857 06/23/20 0939   06/15/20 0600  ceFAZolin (ANCEF) IVPB 2g/100 mL premix        2 g 200 mL/hr over 30 Minutes Intravenous On call to O.R. 06/15/20 0549 06/15/20 0746         Medications  Scheduled Meds: . allopurinol  300 mg Oral Daily  . diltiazem  300 mg Oral Daily  . docusate sodium  100 mg Oral BID  . ferrous sulfate  325 mg Oral TID PC  . fluticasone furoate-vilanterol  1 puff Inhalation Daily  . hydrochlorothiazide  12.5 mg Oral Daily  . irbesartan  300 mg Oral Daily  . lidocaine  1 patch Transdermal Q24H  . mirabegron ER  50 mg Oral Daily  . montelukast  10 mg Oral QHS  . polyethylene glycol  17 g Oral Daily  . rivaroxaban  20 mg Oral Q supper  . simvastatin  20 mg Oral Daily  . umeclidinium bromide  1 puff Inhalation Daily   Continuous Infusions: . sodium chloride Stopped (06/16/20 1200)  . cefTRIAXone (ROCEPHIN)  IV 2 g (06/27/20 1115)   PRN Meds:.acetaminophen, albuterol, diphenhydrAMINE, HYDROcodone-acetaminophen, ipratropium-albuterol, LORazepam, menthol-cetylpyridinium **OR** phenol, metoprolol tartrate, morphine, ondansetron **OR** ondansetron (ZOFRAN) IV      Subjective:   Estrellita Lasky was seen and examined today.  No acute complaints, pain is improving.  No fevers or acute shortness of breath.  No nausea vomiting or abdominal pain.   Objective:   Vitals:   06/27/20 1412 06/27/20 1939 06/28/20 0506 06/28/20 0739  BP: 106/63 110/60 125/69 114/65  Pulse: 77 92 84 84  Resp: 20 18 14 18   Temp: 98.7 F (37.1 C) 97.6 F (36.4 C) 97.8 F (36.6 C) 98.1 F (36.7 C)  TempSrc: Oral     SpO2: 94% (!) 89% 93% 90%  Weight:      Height:        Intake/Output Summary (Last 24 hours) at 06/28/2020 1353 Last data filed at 06/27/2020 1412 Gross per 24 hour   Intake 240 ml  Output --  Net 240 ml     Wt Readings from Last 3 Encounters:  06/15/20 93 kg  06/13/20 93.2 kg  06/07/20 91.6 kg   Physical Exam  General: Alert and oriented x 3, NAD  Cardiovascular: S1 S2 clear, RRR. No pedal edema b/l  Respiratory: CTAB, no wheezing, rales or rhonchi  Gastrointestinal: Soft, nontender, nondistended, NBS  Ext: no pedal edema bilaterally  Skin: No rashes  Psych: Normal affect and demeanor, alert and oriented x3  Data Reviewed:  I have personally reviewed following labs and imaging studies  Micro Results Recent Results (from the past 240 hour(s))  SARS CORONAVIRUS 2 (TAT 6-24 HRS) Nasopharyngeal Nasopharyngeal Swab     Status: None   Collection Time: 06/20/20  1:34 PM   Specimen: Nasopharyngeal Swab  Result Value Ref Range Status   SARS Coronavirus 2 NEGATIVE NEGATIVE Final    Comment: (NOTE) SARS-CoV-2 target nucleic acids are NOT DETECTED.  The SARS-CoV-2 RNA is generally detectable in upper and lower respiratory specimens during the acute phase of infection. Negative results do not preclude SARS-CoV-2 infection, do not rule out co-infections with other pathogens, and should not be used as the sole basis for treatment or other patient management decisions. Negative results must be combined with clinical observations, patient history, and epidemiological information. The expected result is Negative.  Fact Sheet for Patients: SugarRoll.be  Fact Sheet for Healthcare Providers: https://www.woods-mathews.com/  This test is not yet approved or cleared by the [place] FDA and  has been authorized for detection and/or diagnosis of SARS-CoV-2 by FDA under an Emergency Use Authorization (EUA). This EUA will remain  in effect (meaning this test can be used) for the duration of the COVID-19 declaration under Se ction 564(b)(1) of the Act, 21 U.S.C. section 360bbb-3(b)(1), unless the  authorization is terminated or revoked sooner.  Performed at [facility], [place] [address] [place], [place] [address]   SARS CORONAVIRUS 2 (TAT 6-24 HRS) Nasopharyngeal Nasopharyngeal Swab     Status: None   Collection Time: 06/27/20  7:28 PM   Specimen: Nasopharyngeal Swab  Result Value Ref Range Status   SARS Coronavirus 2 NEGATIVE NEGATIVE Final    Comment: (NOTE) SARS-CoV-2 target nucleic acids are NOT DETECTED.  The SARS-CoV-2 RNA is generally detectable in upper and lower respiratory specimens during the acute phase of infection. Negative results do not preclude SARS-CoV-2 infection, do not rule out co-infections with other pathogens, and should not be used as the sole basis for treatment or other patient management decisions. Negative results must be combined with clinical observations, patient history, and epidemiological information. The expected result is Negative.  Fact Sheet for Patients: SugarRoll.be  Fact Sheet for Healthcare Providers: https://www.woods-mathews.com/  This test is not yet approved or cleared by the [place] FDA and  has been authorized for detection and/or diagnosis of SARS-CoV-2 by FDA under an Emergency Use Authorization (EUA). This EUA will remain  in effect (meaning this test can be used) for the duration of the COVID-19 declaration under Se ction 564(b)(1) of the Act, 21 U.S.C. section 360bbb-3(b)(1), unless the authorization is terminated or revoked sooner.  Performed at [facility], [place] [address] [place], [place] [address]     Radiology Reports CT ABDOMEN PELVIS WO CONTRAST  Result Date: 06/22/2020 CLINICAL DATA:  70 year old female with abdominal pain. Concern for hernia. EXAM: CT ABDOMEN AND PELVIS WITHOUT CONTRAST TECHNIQUE: Multidetector CT imaging of the abdomen and pelvis was performed following the standard protocol without IV contrast. COMPARISON:  Abdominal  radiograph dated 06/21/2020. CT abdomen pelvis dated 09/13/2012. FINDINGS: Evaluation of this exam is limited in the absence of intravenous contrast as well as due to respiratory motion artifact. Lower chest: Patchy area of ground-glass and streaky density at the right lung base concerning for pneumonia. Clinical correlation is recommended. There is mild cardiomegaly. No intra-abdominal free air or free fluid. Hepatobiliary: The liver is unremarkable. No intrahepatic biliary ductal dilatation. No calcified gallstone or pericholecystic fluid.  Pancreas: Unremarkable. No pancreatic ductal dilatation or surrounding inflammatory changes. Spleen: Normal in size without focal abnormality. Adrenals/Urinary Tract: The adrenal glands are unremarkable. Status post prior right nephrectomy. There is no hydronephrosis or nephrolithiasis on the left. The left ureter and urinary bladder appear unremarkable. Stomach/Bowel: There is sigmoid diverticulosis and diffuse colonic diverticula without active inflammatory changes. There is no bowel obstruction or active inflammation. The appendix is normal. Vascular/Lymphatic: Advanced aortoiliac atherosclerotic disease. The IVC is unremarkable. No portal venous gas. There is no adenopathy. Reproductive: The uterus is anteverted. There is a 2 cm calcified fibroid. No adnexal masses. Other: There is a broad-based right lateral hernia containing segment of the colon. Musculoskeletal: Osteopenia with degenerative changes of the spine. The disc a shin and vacuum phenomena at L4-L5. No acute osseous pathology. Bilateral hip arthroplasties. IMPRESSION: 1. No acute intra-abdominal or pelvic pathology. 2. Colonic diverticulosis. No bowel obstruction. Normal appendix. 3. Broad-based right lateral hernia containing a segment of the colon. 4. Status post prior right nephrectomy. 5. Aortic Atherosclerosis (ICD10-I70.0). Electronically Signed   By: Anner Crete M.D.   On: 06/22/2020 23:34   DG  Chest 2 View  Result Date: 06/21/2020 CLINICAL DATA:  Wheezing. EXAM: CHEST - 2 VIEW COMPARISON:  Jun 13, 2020. FINDINGS: Stable cardiomegaly. No pneumothorax is noted. Stable bibasilar interstitial opacities are noted concerning for scarring, although acute superimposed edema or inflammation cannot be excluded. Bony thorax is unremarkable. IMPRESSION: Stable bibasilar interstitial opacities are noted concerning for scarring, although superimposed acute edema or inflammation cannot be excluded. Electronically Signed   By: Marijo Conception M.D.   On: 06/21/2020 15:18   DG Chest 2 View  Result Date: 06/15/2020 CLINICAL DATA:  70 year old female preoperative exam. Left knee surgery. Former smoker. EXAM: CHEST - 2 VIEW COMPARISON:  CT Abdomen and Pelvis 09/13/2012. Chest radiographs 12/29/2011. FINDINGS: Moderate to severe cardiomegaly appears progressed since 2014. Other mediastinal contours are within normal limits. Visualized tracheal air column is within normal limits. Chronically large lung volumes. Chronic increased interstitial opacity in both lungs has only mildly progressed since 2013. no pneumothorax, pleural effusion or confluent pulmonary opacity. Previous left humerus ORIF. No acute osseous abnormality identified. Stable cholecystectomy clips. Negative visible bowel gas pattern. IMPRESSION: 1. Moderate to severe cardiomegaly has progressed since 2014. 2. Probable smoking related chronic pulmonary hyperinflation and increased interstitial markings. 3. No superimposed acute cardiopulmonary abnormality. Electronically Signed   By: Genevie Ann M.D.   On: 06/15/2020 06:53   DG Abd 1 View  Result Date: 06/21/2020 CLINICAL DATA:  Constipation EXAM: ABDOMEN - 1 VIEW COMPARISON:  02/08/2018 FINDINGS: Streaky left lung base opacity. Surgical clips in the right upper quadrant. Nonobstructed bowel-gas pattern with mild stool. Calcified fibroid in the pelvis IMPRESSION: Negative. Electronically Signed   By: Donavan Foil M.D.   On: 06/21/2020 18:40   NM Pulmonary Perf and Vent  Result Date: 06/22/2020 CLINICAL DATA:  Chest pain, shortness of breath EXAM: NUCLEAR MEDICINE PERFUSION LUNG SCAN TECHNIQUE: Perfusion images were obtained in multiple projections after intravenous injection of radiopharmaceutical. Ventilation scans intentionally deferred if perfusion scan and chest x-ray adequate for interpretation during COVID 19 epidemic. RADIOPHARMACEUTICALS:  4.2 mCi Tc-20m MAA IV COMPARISON:  Chest x-ray 06/21/2020 FINDINGS: Slightly heterogeneous accumulation of radiotracer throughout the bilateral lung fields. No segmental perfusion defects. Prominent area of central photopenia compatible with cardiomegaly. IMPRESSION: Low probability of pulmonary embolism. Electronically Signed   By: Davina Poke D.O.   On: 06/22/2020 15:07   VAS [place]  LOWER EXTREMITY VENOUS (DVT)  Result Date: 06/20/2020  Lower Venous DVT Study Patient Name:  SHEKITA BOYDEN  Date of Exam:   06/20/2020 Medical Rec #: [id number]     Accession #:    [id number] Date of Birth: 11-Aug-1950     Patient Gender: F Patient Age:   69Y Exam [facility]  [facility] Procedure:      VAS [place] LOWER EXTREMITY VENOUS (DVT) Referring Phys: [id number] Alyson Locket OWENS --------------------------------------------------------------------------------  Indications: Erythema and calf swelling s/p LT arthroplasty 06/15/20.  Limitations: Poor ultrasound/tissue interface and patient pain tolerance. Comparison Study: No prior studies. Performing Technologist: Darlin Coco RDMS,RVT  Examination Guidelines: A complete evaluation includes B-mode imaging, spectral Doppler, color Doppler, and power Doppler as needed of all accessible portions of each vessel. Bilateral testing is considered an integral part of a complete examination. Limited examinations for reoccurring indications may be performed as noted. The reflux portion of the exam is performed with the patient in reverse  Trendelenburg.  +-----+---------------+---------+-----------+----------+--------------+ RIGHTCompressibilityPhasicitySpontaneityPropertiesThrombus Aging +-----+---------------+---------+-----------+----------+--------------+ CFV  Full           Yes      Yes                                 +-----+---------------+---------+-----------+----------+--------------+   +---------+---------------+---------+-----------+----------+--------------+ LEFT     CompressibilityPhasicitySpontaneityPropertiesThrombus Aging +---------+---------------+---------+-----------+----------+--------------+ CFV      Full           Yes      Yes                                 +---------+---------------+---------+-----------+----------+--------------+ SFJ      Full                                                        +---------+---------------+---------+-----------+----------+--------------+ FV Prox  Full                                                        +---------+---------------+---------+-----------+----------+--------------+ FV Mid   Full                                                        +---------+---------------+---------+-----------+----------+--------------+ FV Distal               Yes      Yes                                 +---------+---------------+---------+-----------+----------+--------------+ PFV      Full                                                        +---------+---------------+---------+-----------+----------+--------------+ POP  Full           Yes      Yes                                 +---------+---------------+---------+-----------+----------+--------------+ PTV      Full                                                        +---------+---------------+---------+-----------+----------+--------------+ PERO     Full                                                         +---------+---------------+---------+-----------+----------+--------------+     Summary: RIGHT: - No evidence of common femoral vein obstruction.  LEFT: - There is no evidence of deep vein thrombosis in the lower extremity. However, portions of this examination were limited- see technologist comments above.  - No cystic structure found in the popliteal fossa.  *See table(s) above for measurements and observations. Electronically signed by Harold Barban MD on 06/20/2020 at 9:21:39 PM.    Final    US Abdomen Limited RUQ (LIVER/GB)  Result Date: 06/24/2020 CLINICAL DATA:  RIGHT upper quadrant pain EXAM: ULTRASOUND ABDOMEN LIMITED RIGHT UPPER QUADRANT COMPARISON:  06/21/2020 FINDINGS: Gallbladder: There is biliary sludge. Gallbladder wall thickness is normal. No pericholecystic fluid. No sonographic Murphy sign noted by sonographer. Common bile duct: Diameter: 6 mm, normal Liver: No focal lesion identified. Within normal limits in parenchymal echogenicity. Portal vein is patent on color Doppler imaging with normal direction of blood flow towards the liver. Other: Status post RIGHT nephrectomy. IMPRESSION: No sonographic etiology for RIGHT upper quadrant pain identified. There is biliary sludge without evidence of acute cholecystitis. Electronically Signed   By: Valentino Saxon MD   On: 06/24/2020 17:48    Lab Data:  CBC: Recent Labs  Lab 06/21/20 1629 06/22/20 1330 06/23/20 0115 06/24/20 1358 06/25/20 0110 06/26/20 0709  WBC 17.9* 20.0* 19.4* 26.1* 23.0* 17.3*  NEUTROABS 16.3*  --   --  24.3* 20.1* 14.3*  HGB 12.7 11.7* 11.9* 13.1 11.3* 11.7*  HCT 36.1 34.1* 33.9* 37.7 32.2* 33.3*  MCV 88.5 89.7 89.2 90.0 88.7 88.6  PLT 257 278 288 407* 373 250*   Basic Metabolic Panel: Recent Labs  Lab 06/22/20 1330 06/23/20 0115 06/24/20 1358 06/25/20 0110  NA 139 141 139 138  K 3.3* 4.0 3.8 3.9  CL 102 105 98 101  CO2 29 24 28 27   GLUCOSE 112* 97 112* 151*  BUN 18 17 20  25*  CREATININE 1.15*  1.10* 1.36* 1.24*  CALCIUM 9.0 9.2 9.7 9.3  MG  --   --  2.2  --   PHOS  --   --   --  3.2   GFR: Estimated Creatinine Clearance: 46 mL/min (A) (by C-G formula based on SCr of 1.24 mg/dL (H)). Liver Function Tests: Recent Labs  Lab 06/22/20 1330 06/23/20 0115 06/24/20 1358 06/25/20 0110  AST 19 16 25   --   ALT 13 15 20   --   ALKPHOS 72 75 104  --   BILITOT 1.1 0.8  1.1  --   PROT 6.2* 6.1* 7.5  --   ALBUMIN 2.2* 2.2* 2.4* 2.0*   Recent Labs  Lab 06/22/20 1330  LIPASE 22  AMYLASE 58   No results for input(s): AMMONIA in the last 168 hours. Coagulation Profile: No results for input(s): INR, PROTIME in the last 168 hours. Cardiac Enzymes: No results for input(s): CKTOTAL, CKMB, CKMBINDEX, TROPONINI in the last 168 hours. BNP (last 3 results) No results for input(s): PROBNP in the last 8760 hours. HbA1C: No results for input(s): HGBA1C in the last 72 hours. CBG: No results for input(s): GLUCAP in the last 168 hours. Lipid Profile: No results for input(s): CHOL, HDL, LDLCALC, TRIG, CHOLHDL, LDLDIRECT in the last 72 hours. Thyroid Function Tests: No results for input(s): TSH, T4TOTAL, FREET4, T3FREE, THYROIDAB in the last 72 hours. Anemia Panel: No results for input(s): VITAMINB12, FOLATE, FERRITIN, TIBC, IRON, RETICCTPCT in the last 72 hours. Urine analysis:    Component Value Date/Time   COLORURINE YELLOW 06/13/2020 1056   APPEARANCEUR CLEAR 06/13/2020 1056   LABSPEC 1.025 06/13/2020 1056   PHURINE 6.0 06/13/2020 1056   GLUCOSEU NEGATIVE 06/13/2020 1056   HGBUR NEGATIVE 06/13/2020 [place] 06/13/2020 [place] 06/13/2020 1056   PROTEINUR 100 (A) 06/13/2020 1056   UROBILINOGEN 0.2 12/29/2011 1543   NITRITE NEGATIVE 06/13/2020 [place] 06/13/2020 1056     Darien Kading M.D. [facility] 06/28/2020, 1:53 PM  Available via Epic secure chat 7am-7pm After 7 pm, please refer to night coverage provider  listed on amion.

## 2020-06-28 NOTE — TOC Transition Note (Signed)
Transition of Care [facility]) - CM/SW Discharge Note   Patient Details  Name: Tracy Johnston MRN: [id number] Date of Birth: [birth year]  Transition of [facility]) CM/SW Contact:  Milinda Antis, [place] Phone Number: 06/28/2020, 12:25 PM   Clinical Narrative:    Patient will DC to: Home with [facility] Anticipated DC date: 06/28/2020 Transport by: Chyrl [place]atte   Per MD patient ready for DC home with home health after insurance denied SNF.   Patient has a home health aide who will be at the home 8 hours a day and patient's son can assist when needed.  Home health has been arranged with [facility].  The patient's DME agency is [facility].  The patient is very excited about going home and expressed how well she has progressed with PT/OT during her 13 day hospitalization.   CSW will sign off for now as social work intervention is no longer needed. Please consult [place] again if new needs arise.     Final next level of care: [place] Barriers to Discharge: Barriers Resolved   Patient Goals and CMS Choice Patient states their goals for this hospitalization and ongoing recovery are:: return home CMS Medicare.gov Compare Post Acute Care list provided to:: Patient Choice offered to / list presented to : Patient  Discharge Placement              Patient chooses bed at: Other - please specify in the comment section below: [facility]) Patient to be transferred to facility by: Caregiver Name of family member notified: Patient alert and oriented Patient and family notified of of transfer: 06/28/20  Discharge Plan and Services   Discharge Planning Services: CM Consult Post Acute Care Choice: Home Health          DME Arranged: 3-N-1,[place] rolling DME Agency: [employer] Date DME Agency Contacted: 06/28/20 Time DME Agency Contacted: 1224 Representative spoke with at DME Agency: Freda Munro HH Arranged: PT [place]: [place] Date [place]: 06/28/20 Time [place]: [place] (SDOH) Interventions     Readmission Risk Interventions No flowsheet data found.

## 2020-06-28 NOTE — Progress Notes (Signed)
Subjective: Patient doing well this morning.  Insurance company is now not allowing patient to discharge to skilled nursing facility.  As previously documented by me yesterday if she goes home she will not have much assistance.   Objective: Vital signs in last 24 hours: Temp:  [97.6 F (36.4 C)-98.7 F (37.1 C)] 98.1 F (36.7 C) (06/02 0739) Pulse Rate:  [77-92] 84 (06/02 0739) Resp:  [14-20] 18 (06/02 0739) BP: (106-125)/(60-69) 114/65 (06/02 0739) SpO2:  [89 %-94 %] 90 % (06/02 0739)  Intake/Output from previous day: 06/01 0701 - 06/02 0700 In: 600 [P.O.:600] Out: -  Intake/Output this shift: No intake/output data recorded.  Recent Labs    06/26/20 0709  HGB 11.7*   Recent Labs    06/26/20 0709  WBC 17.3*  RBC 3.76*  HCT 33.3*  PLT 435*   No results for input(s): NA, K, CL, CO2, BUN, CREATININE, GLUCOSE, CALCIUM in the last 72 hours. No results for input(s): LABPT, INR in the last 72 hours.  Exam Pleasant female alert and oriented in no acute distress.  Left knee wound looks good.  Staples intact.  No drainage or signs of infection.  Calf nontender.    Assessment/Plan: Patient's insurance is not allowing discharge to skilled nurse facility.  I assume that this is based on what the PTA documented in their note yesterday.  Skilled nursing facility placement had been recommended by PT/OT up until the afternoon of Jun 26, 2020. My attending and I had felt that SNF placement was in the patient's best interest.  She will try to get as much assistance as she possibly can find and instructed to be careful at times when she is alone.  Patient voices understanding.  All questions answered.  Discharge home today.  Benjiman Core 06/28/2020, 11:36 AM

## 2020-06-28 NOTE — Progress Notes (Signed)
Peer to peer review for skilled nursing facility visit with internal medicine physician.  She stated patient walked 50 feet and does not qualify for coverage for skilled facility and should either stay at a halfway house or pay a friend or pay her son or someone else to stay with her.  Discussed with patient patient states she will call someone and go home.  Risk of fall was discussed with patient as well as the peer to peer MD.

## 2020-06-28 NOTE — Progress Notes (Signed)
Occupational Therapy Treatment Patient Details Name: Tracy Johnston MRN: [id number] DOB: 1950-02-22 Today's Date: 06/28/2020    History of present illness Pt is a 70 y/o female s/p L TKA. She also has a historyu of a hernia which the MD's belive is causing some of her gflank pain.  PMH includes COPD, cervical cancer, a fib, HTN, tobbacco use, and bilateral THA.   OT comments  Arnold is doing very well with plans to d/c home today. Upon arrival pt was bathed, dressed and groomed with reports of doing all ADLs with set up/mod I. Therapist reviewed lower body dressing with use of AE/reacher, pt verbalized understanding. Pt completed sit<>stand and ambulated room distances with rw at supervision level for safety. Pt verbalized home safety plan and fall prevention. Pt would benefit from continued OT acutely however she has d/c plans for today. Recommend pt d/c home with [facility] OT and supervision intermittently for ADLs and mobility initially.    Follow Up Recommendations  Home health OT;Supervision - Intermittent    Equipment Recommendations  3 in 1 bedside commode;Other (comment) (rw)       Precautions / Restrictions Precautions Precautions: Fall;Knee Precaution Booklet Issued: No Precaution Comments: Verbally reviewed knee precautions with pt. Restrictions Weight Bearing Restrictions: Yes LLE Weight Bearing: Weight bearing as tolerated       Mobility Bed Mobility Overal bed mobility: Needs Assistance             General bed mobility comments: pt up in chair upon arrival    Transfers Overall transfer level: Needs assistance Equipment used: Rolling walker (2 wheeled) Transfers: Sit to/from Stand Sit to Stand: Supervision         General transfer comment: supervision for safety    Balance Overall balance assessment: Needs assistance Sitting-balance support: Feet supported Sitting balance-Leahy Scale: Good     Standing balance support: Bilateral upper extremity  supported;During functional activity Standing balance-Leahy Scale: Poor                             ADL either performed or assessed with clinical judgement   ADL Overall ADL's : Needs assistance/impaired         Functional mobility during ADLs: Supervision/safety;Rolling walker General ADL Comments: Pt was dressed prior to arrival; pt reports that she dressed herseld indep wtih sit<>stands; verballed reviewed lower body dressing with the use of a reacher, pt understood.     Vision   Vision Assessment?: No apparent visual deficits          Cognition Arousal/Alertness: Awake/alert Behavior During Therapy: WFL for tasks assessed/performed Overall Cognitive Status: Within Functional Limits for tasks assessed      General Comments: pt eager to get up and walk, excited to go home today              General Comments pt sx bandage dry and intact; dressed upon arival; with reports of bathing and grooming at the sink this morning indep; no new concers noted    Pertinent Vitals/ Pain       Pain Assessment: Faces Faces Pain Scale: Hurts little more Pain Location: L knee Pain Descriptors / Indicators: Grimacing;Guarding;Tightness Pain Intervention(s): Limited activity within patient's tolerance;Monitored during session         Frequency  Min 2X/week        Progress Toward Goals  OT Goals(current goals can now be found in the care plan section)  Progress towards OT goals: Progressing toward  goals  Acute Rehab OT Goals Patient Stated Goal: home OT Goal Formulation: With patient Time For Goal Achievement: 06/30/20 Potential to Achieve Goals: Good ADL Goals Pt Will Perform Grooming: with min assist;standing Pt Will Perform Lower Body Bathing: with min assist;sitting/lateral leans;sit to/from stand Pt Will Perform Lower Body Dressing: with min assist;sitting/lateral leans;sit to/from stand Pt Will Transfer to Toilet: with min assist;ambulating;regular  height toilet;grab bars Pt Will Perform Toileting - Clothing Manipulation and hygiene: with min assist;sitting/lateral leans;sit to/from stand Pt Will Perform Tub/Shower Transfer: Tub transfer;Shower transfer;with min assist;ambulating;shower seat;grab bars;rolling walker Pt/caregiver will Perform Home Exercise Program: Increased strength;Both right and left upper extremity;With Supervision Additional ADL Goal #1: Pt will verbalize/demonstrate 3 fall prevention strategies to incorporate into ADLs/ADL mobility with min assist overall. Additional ADL Goal #2: Pt will verbalize/demonstrate WBAT LLE precautions and safe functional mobility transfers in order to participate in OOB ADLs with min assist overall.  Plan Discharge plan needs to be updated;Equipment recommendations need to be updated       AM-PAC OT "6 Clicks" Daily Activity     Outcome Measure   Help from another person eating meals?: None Help from another person taking care of personal grooming?: A Little Help from another person toileting, which includes using toliet, bedpan, or urinal?: A Little Help from another person bathing (including washing, rinsing, drying)?: A Little Help from another person to put on and taking off regular upper body clothing?: A Little Help from another person to put on and taking off regular lower body clothing?: A Little 6 Click Score: 19    End of Session Equipment Utilized During Treatment: Rolling walker  OT Visit Diagnosis: Unsteadiness on feet (R26.81);Muscle weakness (generalized) (M62.81);Pain Pain - Right/Left: Left Pain - part of body: Leg   Activity Tolerance Patient tolerated treatment well   Patient Left in chair;with call bell/phone within reach   Nurse Communication Mobility status        Time: 1157-2620 OT Time Calculation (min): 15 min  Charges: OT General Charges $OT Visit: 1 Visit OT Treatments $Therapeutic Activity: 8-22 mins     Harlo Fabela A Jayleena Stille 06/28/2020,  11:14 AM

## 2020-06-29 ENCOUNTER — Telehealth: Payer: Self-pay | Admitting: Radiology

## 2020-06-29 NOTE — Telephone Encounter (Signed)
Please see message from [place] in [place] office below. Patient is scheduling first post op appointment. OK to wait until you are back in [place] or do we need to bring patient to [place] office if she is able?  Please advise.  Pt is home from hospital and is needing to be seen. Dr Lorin Mercy will not be here until the 16th. Isn't that too long to go for staple removal??  Please call her to advise   [phone]

## 2020-06-29 NOTE — Telephone Encounter (Signed)
Next Thursday is fine. No rush

## 2020-07-02 NOTE — Telephone Encounter (Signed)
I called patient. She states that she made an appt for 6/8 in [place], but would much rather be seen in [place] office on 6/16 if Dr. Lorin Mercy says this is ok. Per Dr. Lorin Mercy, ok to wait.  Patient appt rescheduled to 6/16 in [place] office at 0900. I sent Baldo Ash an email advising.

## 2020-07-04 ENCOUNTER — Encounter: Payer: Medicare HMO | Admitting: Orthopaedic Surgery

## 2020-07-12 ENCOUNTER — Encounter: Payer: Self-pay | Admitting: Orthopaedic Surgery

## 2020-07-12 ENCOUNTER — Other Ambulatory Visit: Payer: Self-pay

## 2020-07-12 ENCOUNTER — Ambulatory Visit (INDEPENDENT_AMBULATORY_CARE_PROVIDER_SITE_OTHER): Payer: Medicare HMO | Admitting: Orthopaedic Surgery

## 2020-07-12 ENCOUNTER — Ambulatory Visit: Payer: Self-pay

## 2020-07-12 VITALS — BP 108/81 | HR 71 | Ht 64.0 in | Wt 205.0 lb

## 2020-07-12 DIAGNOSIS — Z96652 Presence of left artificial knee joint: Secondary | ICD-10-CM

## 2020-07-12 NOTE — Progress Notes (Signed)
Post-Op Visit Note   Patient: Tracy Johnston           Date of Birth: 07-14-1950           MRN: [id number] Visit Date: 07/12/2020 PCP: Leeanne Rio, MD   Assessment & Plan: Range of motion is 3 degrees to 112 degrees with home health PT.  We will transition to outpatient therapy here in [facility].  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  She is on chronic Opana and no new prescription given.  Chief Complaint:  Chief Complaint  Patient presents with   Left Knee - Routine Post Op    06/15/2020 Left TKA   Visit Diagnoses:  1. Status post total left knee replacement     Plan: Transition outpatient therapy recheck 4 weeks.  2 view x-rays showed good position alignment of total knee arthroplasty.  Follow-Up Instructions: No follow-ups on file.   Orders:  Orders Placed This Encounter  Procedures   XR Knee 1-2 Views Left   No orders of the defined types were placed in this encounter.   Imaging: No results found.  PMFS History: Patient Active Problem List   Diagnosis Date Noted   RUQ pain    Wheezing    Total knee replacement status 06/15/2020   Unilateral primary osteoarthritis, left knee 04/26/2020   BMI 33.0-33.9,adult 08/12/2018   HLD (hyperlipidemia) 08/12/2018   LLQ abdominal pain 08/12/2018   Mixed stress and urge urinary incontinence 08/12/2018   Bilateral primary osteoarthritis of knee 08/12/2018   History of bilateral hip arthroplasty 07/09/2017   Long-term current use of opiate analgesic 02/18/2017   On long term drug therapy 02/06/2017   Chronic low back pain 02/06/2017   Chronic pain syndrome 02/06/2017   Pain in left knee 11/22/2015   Incisional hernia, without obstruction or gangrene 09/09/2012   Acute blood loss anemia 10/20/2011    Class: Acute   COPD (chronic obstructive pulmonary disease) ([place]) 03/03/2011   Dyspnea 12/02/2010   Preop cardiovascular exam 10/31/2010   Claudication ([place]) 10/18/2010   Atrial  fibrillation ([place])    Tobacco abuse    Hypertension    Past Medical History:  Diagnosis Date   Arthritis    Asthma    Atrial fibrillation (HCC)    chronic. Was first diagnosed in her early 61s.   Cancer ([place]) 2003ish   , cervix   COPD (chronic obstructive pulmonary disease) (HCC)    Dysrhythmia    Heart murmur    Hypertension    Insomnia    Malignant neoplasm of kidney ([place])    rt removed 2003   Mitral regurgitation    Mild to moderate. Normal EF 09/2010   Renal insufficiency    Sickle cell anemia (HCC)    Tobacco abuse     Family History  Problem Relation Age of Onset   Diabetes Mother    Cancer Mother        ovarian    Past Surgical History:  Procedure Laterality Date   ABDOMINAL HYSTERECTOMY     arm fracture     rods, pins and bone graft.- to left arm from hip   BONE GRAFT HIP ILIAC CREST     to left arm   CERVICAL CONIZATION W/BX     NEPHRECTOMY  2003   TONSILLECTOMY     TOTAL HIP ARTHROPLASTY  10/17/2011   Procedure: TOTAL HIP ARTHROPLASTY;  Surgeon: [name], MD;  Location: [place];  Service: Orthopedics;  Laterality: Left;  Left Total Hip Arthroplasty   TOTAL HIP ARTHROPLASTY  01/02/2012   Procedure: TOTAL HIP ARTHROPLASTY;  Surgeon: [name], MD;  Location: [place];  Service: Orthopedics;  Laterality: Right;  Right Total Hip Arthroplasty   TOTAL KNEE ARTHROPLASTY Left 06/15/2020   Procedure: LEFT TOTAL KNEE ARTHROPLASTY;  Surgeon: [name], MD;  Location: [place];  Service: Orthopedics;  Laterality: Left;  Needs RNFA   TUBAL LIGATION     Social History   Occupational History   Not on file  Tobacco Use   Smoking status: Former    Packs/day: 1.00    Years: 40.00    Pack years: 40.00    Types: Cigarettes    Start date: 10/21/1983    Quit date: 10/20/2017    Years since quitting: 2.7   Smokeless tobacco: Never   Tobacco comments:    just quit since getting out of hospital 9/24 restarted smoking 9 months ago-AJ  Vaping Use   Vaping Use: Never  used  Substance and Sexual Activity   Alcohol use: Yes    Alcohol/week: 0.0 standard drinks    Comment: occasions only 1 glass of wine   Drug use: No   Sexual activity: Not Currently

## 2020-07-12 NOTE — Addendum Note (Signed)
Addended by: Meyer Cory on: 07/12/2020 09:45 AM   Modules accepted: Orders

## 2020-08-09 ENCOUNTER — Other Ambulatory Visit: Payer: Self-pay

## 2020-08-09 ENCOUNTER — Ambulatory Visit (INDEPENDENT_AMBULATORY_CARE_PROVIDER_SITE_OTHER): Payer: Medicare HMO | Admitting: Orthopaedic Surgery

## 2020-08-09 DIAGNOSIS — Z96652 Presence of left artificial knee joint: Secondary | ICD-10-CM

## 2020-08-09 NOTE — Progress Notes (Signed)
Post-Op Visit Note   Patient: Tracy Johnston           Date of Birth: [birth year]           MRN: [id number] Visit Date: 08/09/2020 PCP: Leeanne Rio, MD   Assessment & Plan: Patient is ambulatory with a cane she has keloid formation in her incision.  She has full extension flexes to 115.  Quad strength improving.  Chief Complaint: Post left total knee arthroplasty  Visit Diagnoses:  1. S/P total knee arthroplasty, left     Plan: Continue therapy recheck 5 weeks.  Follow-Up Instructions: Return in about 5 weeks (around 09/13/2020).   Orders:  No orders of the defined types were placed in this encounter.  No orders of the defined types were placed in this encounter.   Imaging: No results found.  PMFS History: Patient Active Problem List   Diagnosis Date Noted   RUQ pain    Wheezing    S/P total knee arthroplasty, 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   BMI 33.0-33.9,adult 08/12/2018   HLD (hyperlipidemia) 08/12/2018   LLQ abdominal pain 08/12/2018   Mixed stress and urge urinary incontinence 08/12/2018   History of bilateral hip arthroplasty 07/09/2017   Long-term current use of opiate analgesic 02/18/2017   On long term drug therapy 02/06/2017   Chronic low back pain 02/06/2017   Chronic pain syndrome 02/06/2017   Pain in left knee 11/22/2015   Incisional hernia, without obstruction or gangrene 09/09/2012   Acute blood loss anemia 10/20/2011    Class: Acute   COPD (chronic obstructive pulmonary disease) ([place]) 03/03/2011   Dyspnea 12/02/2010   Preop cardiovascular exam 10/31/2010   Claudication ([place]) 10/18/2010   Atrial fibrillation ([place])    Tobacco abuse    Hypertension    Past Medical History:  Diagnosis Date   Arthritis    Asthma    Atrial fibrillation (HCC)    chronic. Was first diagnosed in her early 16s.   Cancer ([place]) 2003ish   , cervix   COPD (chronic obstructive pulmonary disease) (HCC)    Dysrhythmia    Heart murmur    Hypertension    Insomnia    Malignant  neoplasm of kidney ([place])    rt removed 2003   Mitral regurgitation    Mild to moderate. Normal EF 09/2010   Renal insufficiency    Sickle cell anemia (HCC)    Tobacco abuse     Family History  Problem Relation Age of Onset   Diabetes Mother    Cancer Mother        ovarian    Past Surgical History:  Procedure Laterality Date   ABDOMINAL HYSTERECTOMY     arm fracture     rods, pins and bone graft.- to left arm from hip   BONE GRAFT HIP ILIAC CREST     to left arm   CERVICAL CONIZATION W/BX     NEPHRECTOMY  2003   TONSILLECTOMY     TOTAL HIP ARTHROPLASTY  10/17/2011   Procedure: TOTAL HIP ARTHROPLASTY;  Surgeon: [name], MD;  Location: [place];  Service: Orthopedics;  Laterality: Left;  Left Total Hip Arthroplasty   TOTAL HIP ARTHROPLASTY  01/02/2012   Procedure: TOTAL HIP ARTHROPLASTY;  Surgeon: [name], MD;  Location: [place];  Service: Orthopedics;  Laterality: Right;  Right Total Hip Arthroplasty   TOTAL KNEE ARTHROPLASTY Left 06/15/2020   Procedure: LEFT TOTAL KNEE ARTHROPLASTY;  Surgeon: [name], MD;  Location: [place];  Service: Orthopedics;  Laterality: Left;  Needs RNFA   TUBAL LIGATION     Social History   Occupational History   Not on file  Tobacco Use   Smoking status: Former    Packs/day: 1.00    Years: 40.00    Pack years: 40.00    Types: Cigarettes    Start date: 10/21/1983    Quit date: 10/20/2017    Years since quitting: 2.8   Smokeless tobacco: Never   Tobacco comments:    just quit since getting out of hospital 9/24 restarted smoking 9 months ago-AJ  Vaping Use   Vaping Use: Never used  Substance and Sexual Activity   Alcohol use: Yes    Alcohol/week: 0.0 standard drinks    Comment: occasions only 1 glass of wine   Drug use: No   Sexual activity: Not Currently

## 2020-09-06 ENCOUNTER — Ambulatory Visit (INDEPENDENT_AMBULATORY_CARE_PROVIDER_SITE_OTHER): Payer: Medicare HMO | Admitting: Orthopaedic Surgery

## 2020-09-06 ENCOUNTER — Other Ambulatory Visit: Payer: Self-pay

## 2020-09-06 DIAGNOSIS — Z96652 Presence of left artificial knee joint: Secondary | ICD-10-CM

## 2020-09-06 NOTE — Progress Notes (Signed)
Post-Op Visit Note   Patient: Tracy Johnston           Date of Birth: 03-Jan-1951           MRN: [id number] Visit Date: 09/06/2020 PCP: Leeanne Rio, MD   Assessment & Plan: Follow-up left total knee arthroplasty 06/15/2020.  She is amatory with a cane and has full extension.  Flexion past 110 degrees.  She has some mild keloid formation proximally in the incision and has been applying lotion.  Other scars did not have significant keloid formation.  Transverse right lateral abdominal incision also right posterior total hip arthroplasty both well-healed.  She can continue therapy strengthening.  Work without the cane as she gets stronger and balance is improved.  Chief Complaint:  Chief Complaint  Patient presents with   Left Knee - Routine Post Op    06/15/20 Left TKA   Visit Diagnoses:  1. S/P total knee arthroplasty, left     Plan: Return in 2 months.  Follow-Up Instructions: Return in about 2 months (around 11/06/2020).   Orders:  No orders of the defined types were placed in this encounter.  No orders of the defined types were placed in this encounter.   Imaging: No results found.  PMFS History: Patient Active Problem List   Diagnosis Date Noted   RUQ pain    Wheezing    S/P total knee arthroplasty, 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   BMI 33.0-33.9,adult 08/12/2018   HLD (hyperlipidemia) 08/12/2018   LLQ abdominal pain 08/12/2018   Mixed stress and urge urinary incontinence 08/12/2018   History of bilateral hip arthroplasty 07/09/2017   Long-term current use of opiate analgesic 02/18/2017   On long term drug therapy 02/06/2017   Chronic low back pain 02/06/2017   Chronic pain syndrome 02/06/2017   Pain in left knee 11/22/2015   Incisional hernia, without obstruction or gangrene 09/09/2012   Acute blood loss anemia 10/20/2011    Class: Acute   COPD (chronic obstructive pulmonary disease) ([place]) 03/03/2011   Dyspnea 12/02/2010   Preop cardiovascular exam 10/31/2010    Claudication ([place]) 10/18/2010   Atrial fibrillation ([place])    Tobacco abuse    Hypertension    Past Medical History:  Diagnosis Date   Arthritis    Asthma    Atrial fibrillation (HCC)    chronic. Was first diagnosed in her early 84s.   Cancer ([place]) 2003ish   , cervix   COPD (chronic obstructive pulmonary disease) (HCC)    Dysrhythmia    Heart murmur    Hypertension    Insomnia    Malignant neoplasm of kidney ([place])    rt removed 2003   Mitral regurgitation    Mild to moderate. Normal EF 09/2010   Renal insufficiency    Sickle cell anemia (HCC)    Tobacco abuse     Family History  Problem Relation Age of Onset   Diabetes Mother    Cancer Mother        ovarian    Past Surgical History:  Procedure Laterality Date   ABDOMINAL HYSTERECTOMY     arm fracture     rods, pins and bone graft.- to left arm from hip   BONE GRAFT HIP ILIAC CREST     to left arm   CERVICAL CONIZATION W/BX     NEPHRECTOMY  2003   TONSILLECTOMY     TOTAL HIP ARTHROPLASTY  10/17/2011   Procedure: TOTAL HIP ARTHROPLASTY;  Surgeon: [name], MD;  Location: [facility]  OR;  Service: Orthopedics;  Laterality: Left;  Left Total Hip Arthroplasty   TOTAL HIP ARTHROPLASTY  01/02/2012   Procedure: TOTAL HIP ARTHROPLASTY;  Surgeon: [name], MD;  Location: [place];  Service: Orthopedics;  Laterality: Right;  Right Total Hip Arthroplasty   TOTAL KNEE ARTHROPLASTY Left 06/15/2020   Procedure: LEFT TOTAL KNEE ARTHROPLASTY;  Surgeon: [name], MD;  Location: [place];  Service: Orthopedics;  Laterality: Left;  Needs RNFA   TUBAL LIGATION     Social History   Occupational History   Not on file  Tobacco Use   Smoking status: Former    Packs/day: 1.00    Years: 40.00    Pack years: 40.00    Types: Cigarettes    Start date: 10/21/1983    Quit date: 10/20/2017    Years since quitting: 2.8   Smokeless tobacco: Never   Tobacco comments:    just quit since getting out of hospital 9/24 restarted smoking 9 months  ago-AJ  Vaping Use   Vaping Use: Never used  Substance and Sexual Activity   Alcohol use: Yes    Alcohol/week: 0.0 standard drinks    Comment: occasions only 1 glass of wine   Drug use: No   Sexual activity: Not Currently

## 2020-10-18 ENCOUNTER — Encounter: Payer: Self-pay | Admitting: Orthopaedic Surgery

## 2020-10-18 ENCOUNTER — Ambulatory Visit (INDEPENDENT_AMBULATORY_CARE_PROVIDER_SITE_OTHER): Payer: Medicare HMO | Admitting: Orthopaedic Surgery

## 2020-10-18 ENCOUNTER — Other Ambulatory Visit: Payer: Self-pay

## 2020-10-18 VITALS — Ht 63.0 in | Wt 194.0 lb

## 2020-10-18 DIAGNOSIS — Z96652 Presence of left artificial knee joint: Secondary | ICD-10-CM | POA: Diagnosis not present

## 2020-10-18 DIAGNOSIS — M6281 Muscle weakness (generalized): Secondary | ICD-10-CM | POA: Diagnosis not present

## 2020-10-18 NOTE — Progress Notes (Signed)
Office Visit Note   Patient: Tracy Johnston           Date of Birth: 02-21-1950           MRN: [id number] Visit Date: 10/18/2020              Requested by: Leeanne Rio, MD [place],  [place] [id number] PCP: Leeanne Rio, MD   Assessment & Plan: Visit Diagnoses:  1. S/P total knee arthroplasty, left   2. Quadriceps weakness     Plan: Continue physical therapy.  She is a lot of work to do to get her quad stronger.  She can walk better.  Good relief of preop knee pain.  Recheck 2 months.  Follow-Up Instructions: No follow-ups on file.   Orders:  Orders Placed This Encounter  Procedures   XR Knee 1-2 Views Left   No orders of the defined types were placed in this encounter.     Procedures: No procedures performed   Clinical Data: No additional findings.   Subjective: Chief Complaint  Patient presents with   Left Knee - Follow-up    06/15/2020 left TKA    HPI follow-up left total knee arthroplasty.  She notices popping in her knee she cannot go up on a step with her left foot first.  She still having to use a cane and has a hop step due to quad weakness.  Patient's been on long-term opiate analgesics.  She still doing her exercises at home.  Still has significant COPD with some mild dyspnea.  Review of Systems unchanged   Objective: Vital Signs: Ht 5\' 3"  (1.6 m)   Wt 194 lb (88 kg)   BMI 34.37 kg/m   Physical Exam unchanged  Ortho Exam healed left knee incision with some keloid formation.  She lacks about 3 degrees reaching full extension but has weak quad that I can overcome with single finger pressure at the ankle.  She is not able to go up on a step left foot first due to quad weakness.  She can flex past 90 degrees easily.  Specialty Comments:  No specialty comments available.  Imaging: No results found.   PMFS History: Patient Active Problem List   Diagnosis Date Noted   Quadriceps weakness 10/18/2020   RUQ pain    Wheezing     S/P total knee arthroplasty, 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   BMI 33.0-33.9,adult 08/12/2018   HLD (hyperlipidemia) 08/12/2018   LLQ abdominal pain 08/12/2018   Mixed stress and urge urinary incontinence 08/12/2018   History of bilateral hip arthroplasty 07/09/2017   Long-term current use of opiate analgesic 02/18/2017   On long term drug therapy 02/06/2017   Chronic low back pain 02/06/2017   Chronic pain syndrome 02/06/2017   Pain in left knee 11/22/2015   Incisional hernia, without obstruction or gangrene 09/09/2012   Acute blood loss anemia 10/20/2011    Class: Acute   COPD (chronic obstructive pulmonary disease) ([facility]) 03/03/2011   Dyspnea 12/02/2010   Preop cardiovascular exam 10/31/2010   Claudication ([facility]) 10/18/2010   Atrial fibrillation ([facility])    Tobacco abuse    Hypertension    Past Medical History:  Diagnosis Date   Arthritis    Asthma    Atrial fibrillation (HCC)    chronic. Was first diagnosed in her early 74s.   Cancer ([facility]) 2003ish   , cervix   COPD (chronic obstructive pulmonary disease) (HCC)    Dysrhythmia    Heart murmur  Hypertension    Insomnia    Malignant neoplasm of kidney ([facility])    rt removed 2003   Mitral regurgitation    Mild to moderate. Normal EF 09/2010   Renal insufficiency    Sickle cell anemia (HCC)    Tobacco abuse     Family History  Problem Relation Age of Onset   Diabetes Mother    Cancer Mother        ovarian    Past Surgical History:  Procedure Laterality Date   ABDOMINAL HYSTERECTOMY     arm fracture     rods, pins and bone graft.- to left arm from hip   BONE GRAFT HIP ILIAC CREST     to left arm   CERVICAL CONIZATION W/BX     NEPHRECTOMY  2003   TONSILLECTOMY     TOTAL HIP ARTHROPLASTY  10/17/2011   Procedure: TOTAL HIP ARTHROPLASTY;  Surgeon: [name], MD;  Location: [facility];  Service: Orthopedics;  Laterality: Left;  Left Total Hip Arthroplasty   TOTAL HIP ARTHROPLASTY  01/02/2012   Procedure: TOTAL HIP ARTHROPLASTY;   Surgeon: [name], MD;  Location: [facility];  Service: Orthopedics;  Laterality: Right;  Right Total Hip Arthroplasty   TOTAL KNEE ARTHROPLASTY Left 06/15/2020   Procedure: LEFT TOTAL KNEE ARTHROPLASTY;  Surgeon: [name], MD;  Location: [facility];  Service: Orthopedics;  Laterality: Left;  Needs RNFA   TUBAL LIGATION     Social History   Occupational History   Not on file  Tobacco Use   Smoking status: Former    Packs/day: 1.00    Years: 40.00    Pack years: 40.00    Types: Cigarettes    Start date: 10/21/1983    Quit date: 10/20/2017    Years since quitting: 2.9   Smokeless tobacco: Never   Tobacco comments:    just quit since getting out of hospital 9/24 restarted smoking 9 months ago-AJ  Vaping Use   Vaping Use: Never used  Substance and Sexual Activity   Alcohol use: Yes    Alcohol/week: 0.0 standard drinks    Comment: occasions only 1 glass of wine   Drug use: No   Sexual activity: Not Currently

## 2020-12-13 ENCOUNTER — Encounter: Payer: Self-pay | Admitting: Orthopaedic Surgery

## 2020-12-13 ENCOUNTER — Ambulatory Visit (INDEPENDENT_AMBULATORY_CARE_PROVIDER_SITE_OTHER): Payer: Medicare HMO | Admitting: Orthopaedic Surgery

## 2020-12-13 ENCOUNTER — Telehealth: Payer: Self-pay | Admitting: Family Medicine

## 2020-12-13 ENCOUNTER — Ambulatory Visit (INDEPENDENT_AMBULATORY_CARE_PROVIDER_SITE_OTHER): Payer: Medicare HMO

## 2020-12-13 ENCOUNTER — Other Ambulatory Visit: Payer: Self-pay

## 2020-12-13 VITALS — Ht 63.0 in | Wt 190.0 lb

## 2020-12-13 DIAGNOSIS — Z96652 Presence of left artificial knee joint: Secondary | ICD-10-CM | POA: Diagnosis not present

## 2020-12-13 DIAGNOSIS — I340 Nonrheumatic mitral (valve) insufficiency: Secondary | ICD-10-CM

## 2020-12-13 LAB — ECHOCARDIOGRAM COMPLETE
AR max vel: 2.53 cm2
AV Area VTI: 2.25 cm2
AV Area mean vel: 2.09 cm2
AV Mean grad: 3.3 mmHg
AV Peak grad: 4.7 mmHg
AV Vena cont: 0.68 cm
Ao pk vel: 1.09 m/s
Area-P 1/2: 2.69 cm2
Calc EF: 63.4 %
MV M vel: 5.16 m/s
MV Peak grad: 106.3 mmHg
MV Vena cont: 0.79 cm
P 1/2 time: 671 msec
Radius: 0.6 cm
S' Lateral: 3.1 cm
Single Plane A2C EF: 59.5 %
Single Plane A4C EF: 65.6 %

## 2020-12-13 NOTE — Progress Notes (Signed)
Office Visit Note   Patient: Tracy Johnston           Date of Birth: [birth year]           MRN: [id number] Visit Date: 12/13/2020              Requested by: Leeanne Rio, MD [place],  [place] [address] PCP: Leeanne Rio, MD   Assessment & Plan: Visit Diagnoses:  1. S/P total knee arthroplasty, left     Plan: Therapy is released her she is happy with the results of therapy is walking much better.  Pain is significantly improved.  She will continue her home exercises and can return to see me on an as-needed basis.  She states she was surprised how much more work total knee takes than her previous hip arthroplasty.  Follow-Up Instructions: Return if symptoms worsen or fail to improve.   Orders:  No orders of the defined types were placed in this encounter.  No orders of the defined types were placed in this encounter.     Procedures: No procedures performed   Clinical Data: No additional findings.   Subjective: Chief Complaint  Patient presents with   Left Knee - Follow-up    06/15/2020 left TKA    HPI post left total knee arthroplasty with residual quad weakness which is improved with therapy.  She is ambulatory no longer with hip flexed.  She has some rattling in her chest and had an appointment with her cardiology doctor this morning.  She is still on morphine 15 mg 4 times a day.  She is amatory without a cane.  Review of Systems updated unchanged from previous visit.   Objective: Vital Signs: Ht 5\' 3"  (1.6 m)   Wt 190 lb (86.2 kg)   BMI 33.66 kg/m   Physical Exam Constitutional:      Appearance: She is well-developed.  HENT:     Head: Normocephalic.     Right Ear: External ear normal.     Left Ear: External ear normal. There is no impacted cerumen.  Eyes:     Pupils: Pupils are equal, round, and reactive to light.  Neck:     Thyroid: No thyromegaly.     Trachea: No tracheal deviation.  Cardiovascular:     Rate and Rhythm: Normal  rate.  Pulmonary:     Effort: Pulmonary effort is normal.  Abdominal:     Palpations: Abdomen is soft.  Musculoskeletal:     Cervical back: No rigidity.  Skin:    General: Skin is warm and dry.  Neurological:     Mental Status: She is alert and oriented to person, place, and time.  Psychiatric:        Behavior: Behavior normal.    Ortho Exam no extension lag she can ambulate without a cane.  She is still very deliberate where she puts her foot with short stride rapid sequence gait.  In sitting position she can do a straight leg raise and demonstrates good quad resistance to hand resistant testing of the quad.  Specialty Comments:  No specialty comments available.  Imaging: No results found.   PMFS History: Patient Active Problem List   Diagnosis Date Noted   Quadriceps weakness 10/18/2020   RUQ pain    Wheezing    S/P total knee arthroplasty, 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   BMI 33.0-33.9,adult 08/12/2018   HLD (hyperlipidemia) 08/12/2018   LLQ abdominal pain 08/12/2018   Mixed stress and urge  urinary incontinence 08/12/2018   History of bilateral hip arthroplasty 07/09/2017   Long-term current use of opiate analgesic 02/18/2017   On long term drug therapy 02/06/2017   Chronic low back pain 02/06/2017   Chronic pain syndrome 02/06/2017   Pain in left knee 11/22/2015   Incisional hernia, without obstruction or gangrene 09/09/2012   Acute blood loss anemia 10/20/2011    Class: Acute   COPD (chronic obstructive pulmonary disease) ([place]) 03/03/2011   Dyspnea 12/02/2010   Preop cardiovascular exam 10/31/2010   Claudication ([facility]) 10/18/2010   Atrial fibrillation (HCC)    Tobacco abuse    Hypertension    Past Medical History:  Diagnosis Date   Arthritis    Asthma    Atrial fibrillation (HCC)    chronic. Was first diagnosed in her early 76s.   Cancer ([facility]) 2003ish   , cervix   COPD (chronic obstructive pulmonary disease) (HCC)    Dysrhythmia    Heart murmur    Hypertension     Insomnia    Malignant neoplasm of kidney ([facility])    rt removed 2003   Mitral regurgitation    Mild to moderate. Normal EF 09/2010   Renal insufficiency    Sickle cell anemia (HCC)    Tobacco abuse     Family History  Problem Relation Age of Onset   Diabetes Mother    Cancer Mother        ovarian    Past Surgical History:  Procedure Laterality Date   ABDOMINAL HYSTERECTOMY     arm fracture     rods, pins and bone graft.- to left arm from hip   BONE GRAFT HIP ILIAC CREST     to left arm   CERVICAL CONIZATION W/BX     NEPHRECTOMY  2003   TONSILLECTOMY     TOTAL HIP ARTHROPLASTY  10/17/2011   Procedure: TOTAL HIP ARTHROPLASTY;  Surgeon: [name], MD;  Location: [facility];  Service: Orthopedics;  Laterality: Left;  Left Total Hip Arthroplasty   TOTAL HIP ARTHROPLASTY  01/02/2012   Procedure: TOTAL HIP ARTHROPLASTY;  Surgeon: [name], MD;  Location: [facility];  Service: Orthopedics;  Laterality: Right;  Right Total Hip Arthroplasty   TOTAL KNEE ARTHROPLASTY Left 06/15/2020   Procedure: LEFT TOTAL KNEE ARTHROPLASTY;  Surgeon: [name], MD;  Location: [facility];  Service: Orthopedics;  Laterality: Left;  Needs RNFA   TUBAL LIGATION     Social History   Occupational History   Not on file  Tobacco Use   Smoking status: Former    Packs/day: 1.00    Years: 40.00    Pack years: 40.00    Types: Cigarettes    Start date: 10/21/1983    Quit date: 10/20/2017    Years since quitting: 3.1   Smokeless tobacco: Never   Tobacco comments:    just quit since getting out of hospital 9/24 restarted smoking 9 months ago-AJ  Vaping Use   Vaping Use: Never used  Substance and Sexual Activity   Alcohol use: Yes    Alcohol/week: 0.0 standard drinks    Comment: occasions only 1 glass of wine   Drug use: No   Sexual activity: Not Currently

## 2020-12-13 NOTE — Telephone Encounter (Signed)
Did not need this encounter °

## 2020-12-17 NOTE — Progress Notes (Signed)
Cardiology Office Note  Date: 12/18/2020   ID: Tracy Johnston, DOB 1950-07-22, MRN [id number]  PCP:  Leeanne Rio, MD  Cardiologist:  None Electrophysiologist:  None   Chief Complaint: 1 year follow-up  History of Present Illness: Tracy Johnston is a 70 y.o. female with a history of permanent atrial fibrillation, HTN, mitral valve insufficiency, bilateral lower extremity edema, COPD, mitral regurgitation, tobacco abuse, renal CA, sickle cell anemia.  Last encounter with Dr. Bronson Ing 11/08/2018 via telemedicine visit. Reprted she very seldom had chest pains which were not bothersome.  She denied any palpitations or leg swelling.  Her atrial fibrillation was symptomatically stable.  Rate was controlled on diltiazem and continuing Xarelto.  Blood pressure was mildly elevated.  There were no changes to medications.  Echocardiogram was ordered to further evaluate mitral regurgitation.  Bilateral leg swelling was likely due to venous insufficiency.  Advised to keep legs elevated and could use compression stockings.  She was last here for 1 year follow-up.  History of chronic shortness of breath due to history of COPD/tobacco use.  She stated she was supposed to be on 2 L nasal cannula at home but currently not using.  She was having problems sleeping at night.  She denied any anginal symptoms, significant palpitations or arrhythmias, orthostatic symptoms, CVA or TIA-like symptoms.  Admitted to some orthopnea and had to sleep elevated on pillows.  Denied any PND.  No bleeding issues, claudication-like symptoms, DVT or PE-like symptoms, lower extremity edema.  She had lab work earlier in the year with PCP and everything was normal.  EKG showed atrial fibrillation with a rate of 79.    She is here for 1 year follow-up and follow-up on her echocardiogram for aortic insufficiency.  She has a chronic loose cough from her COPD.  She states she does not smoke anymore.  Blood pressure is elevated on  arrival today however she states she has not taken her a.m. blood pressure medication at this point.  She denies any anginal symptoms.  Denies any SOB or DOE beyond her norm given her history of COPD and smoking.  She has a chronic loose cough.  She states she is not taking her a.m. nebulizer treatment as of yet today.  We discussed the results of her echocardiogram which demonstrated EF of 60 to 65% no WMA's, moderate LVH, indeterminate diastolic parameters, LA severely dilated, atrial septum bows to the right suggesting elevated LA pressure, small pericardial effusion that is circumferential.  Mild MR, moderate AR.  Denies any palpitations or arrhythmias, orthostatic symptoms, CVA or TIA-like symptoms, PND or orthopnea.  No bleeding issues, claudication-like symptoms, DVT or PE-like symptoms, or lower extremity edema.  States she has an upcoming visit with her primary care provider in February and will have lab work at that time.  She states she needs to pick up some prednisone which she takes for her COPD flares at times.  Past Medical History:  Diagnosis Date   Arthritis    Asthma    Atrial fibrillation (HCC)    chronic. Was first diagnosed in her early 8s.   Cancer ([facility]) 2003ish   , cervix   COPD (chronic obstructive pulmonary disease) (HCC)    Dysrhythmia    Heart murmur    Hypertension    Insomnia    Malignant neoplasm of kidney ([facility])    rt removed 2003   Mitral regurgitation    Mild to moderate. Normal EF 09/2010   Renal insufficiency  Sickle cell anemia (HCC)    Tobacco abuse     Past Surgical History:  Procedure Laterality Date   ABDOMINAL HYSTERECTOMY     arm fracture     rods, pins and bone graft.- to left arm from hip   BONE GRAFT HIP ILIAC CREST     to left arm   CERVICAL CONIZATION W/BX     NEPHRECTOMY  2003   TONSILLECTOMY     TOTAL HIP ARTHROPLASTY  10/17/2011   Procedure: TOTAL HIP ARTHROPLASTY;  Surgeon: [name], MD;  Location: [facility];  Service:  Orthopedics;  Laterality: Left;  Left Total Hip Arthroplasty   TOTAL HIP ARTHROPLASTY  01/02/2012   Procedure: TOTAL HIP ARTHROPLASTY;  Surgeon: [name], MD;  Location: [facility];  Service: Orthopedics;  Laterality: Right;  Right Total Hip Arthroplasty   TOTAL KNEE ARTHROPLASTY Left 06/15/2020   Procedure: LEFT TOTAL KNEE ARTHROPLASTY;  Surgeon: [name], MD;  Location: [facility];  Service: Orthopedics;  Laterality: Left;  Needs RNFA   TUBAL LIGATION      Current Outpatient Medications  Medication Sig Dispense Refill   allopurinol (ZYLOPRIM) 300 MG tablet TAKE 1 TABLET BY MOUTH DAILY. (Patient taking differently: Take 300 mg by mouth daily.) 30 tablet 5   diltiazem (CARDIZEM CD) 240 MG 24 hr capsule Take 1 capsule (240 mg total) by mouth daily. 30 capsule 6   Fluticasone-Umeclidin-Vilant (TRELEGY ELLIPTA) 100-62.5-25 MCG/INH AEPB Inhale 1 puff into the lungs daily.     ipratropium-albuterol (DUONEB) 0.5-2.5 (3) MG/3ML SOLN Inhale 3 mLs into the lungs every 6 (six) hours as needed for shortness of breath.     montelukast (SINGULAIR) 10 MG tablet Take 10 mg by mouth at bedtime as needed for allergies.     morphine (MSIR) 15 MG tablet morphine 15 mg immediate release tablet  Take 1 tablet 4 times a day by oral route as needed.     MYRBETRIQ 50 MG TB24 tablet Take 50 mg by mouth daily.     olmesartan-hydrochlorothiazide (BENICAR HCT) 40-12.5 MG per tablet Take 1 tablet by mouth daily.       polyethylene glycol (MIRALAX / GLYCOLAX) 17 g packet Take 17 g by mouth daily. 30 each 0   predniSONE (DELTASONE) 20 MG tablet Take 20 mg by mouth daily as needed (short of breath).  0   PROAIR HFA 108 (90 Base) MCG/ACT inhaler Inhale 2 puffs into the lungs every 4 (four) hours as needed for shortness of breath or wheezing.     rivaroxaban (XARELTO) 20 MG TABS tablet Take 1 tablet (20 mg total) by mouth at bedtime. 30 tablet 0   simvastatin (ZOCOR) 20 MG tablet Take 20 mg by mouth daily.     No current  facility-administered medications for this visit.   Allergies:  Bupropion, Aspirin, and Latex   Social History: The patient  reports that she quit smoking about 3 years ago. Her smoking use included cigarettes. She started smoking about 37 years ago. She has a 40.00 pack-year smoking history. She has never used smokeless tobacco. She reports current alcohol use. She reports that she does not use drugs.   Family History: The patient's family history includes Cancer in her mother; Diabetes in her mother.   ROS:  Please see the history of present illness. Otherwise, complete review of systems is positive for none.  All other systems are reviewed and negative.   Physical Exam: VS:  BP 140/82   Pulse 69  Ht 5\' 3"  (1.6 m)   Wt 199 lb 9.6 oz (90.5 kg)   SpO2 97%   BMI 35.36 kg/m , BMI Body mass index is 35.36 kg/m.  Wt Readings from Last 3 Encounters:  12/18/20 199 lb 9.6 oz (90.5 kg)  12/13/20 190 lb (86.2 kg)  10/18/20 194 lb (88 kg)    General: Patient appears comfortable at rest. Neck: Supple, no elevated JVP or carotid bruits, no thyromegaly. Lungs: Clear to auscultation, nonlabored breathing at rest. Cardiac: Regular rate and rhythm, no S3 or mild systolic murmur heard at the left and right upper sternal borders., no pericardial rub. Extremities: No pitting edema, distal pulses 2+. Skin: Warm and dry. Musculoskeletal: No kyphosis. Neuropsychiatric: Alert and oriented x3, affect grossly appropriate.  ECG:  An ECG dated 12/19/2019 was personally reviewed today and demonstrated:  Atrial fibrillation with a competing junctional pacemaker.  ST and T wave abnormalities, consider inferolateral ischemia, rate of 79  Recent Labwork: 06/24/2020: ALT 20; AST 25; Magnesium 2.2 06/25/2020: BUN 25; Creatinine, Ser 1.24; Potassium 3.9; Sodium 138 06/26/2020: Hemoglobin 11.7; Platelets 435  No results found for: CHOL, TRIG, HDL, CHOLHDL, VLDL, LDLCALC, LDLDIRECT  Other Studies Reviewed  Today:   Echocardiogram 12/13/2020  1. Left ventricular ejection fraction, by estimation, is 60 to 65%. The  left ventricle has normal function. The left ventricle has no regional  wall motion abnormalities. There is moderate left ventricular hypertrophy.  Left ventricular diastolic  parameters are indeterminate.   2. Right ventricular systolic function is normal. The right ventricular  size is normal. There is normal pulmonary artery systolic pressure.   3. Left atrial size was severely dilated.   4. Atrial septum bows to the right suggesting elevated LA pressure.   5. A small pericardial effusion is present. The pericardial effusion is  circumferential.   6. The mitral valve is abnormal. Mild mitral valve regurgitation. No  evidence of mitral stenosis.   7. The aortic valve is tricuspid. Aortic valve regurgitation is moderate.  No aortic stenosis is present.   8. The inferior vena cava is normal in size with greater than 50%  respiratory variability, suggesting right atrial pressure of 3 mmHg.      NST in April 2015. Perfusion imaging showed a small anteroseptal, partially reversible defect of moderate intensity. Although ischemia could not be excluded in the distribution of the mid to distal LAD, variable soft tissue attenuation may have also been responsible. Regional wall motion was normal.   Echocardiogram 06/23/2019. 1. Left ventricular ejection fraction, by estimation, is 55 to 60%. The left ventricle has normal function. The left ventricle has no regional wall motion abnormalities. Left ventricular diastolic parameters were normal. 2. Right ventricular systolic function is normal. The right ventricular size is normal. There is normal pulmonary artery systolic pressure. 3. Left atrial size was severely dilated. 4. Right atrial size was mildly dilated. 5. The mitral valve is normal in structure. Moderate mitral valve regurgitation. No evidence of mitral stenosis. 6. The  aortic valve is tricuspid. Aortic valve regurgitation is moderate. No aortic stenosis is present. 7. The inferior vena cava is normal in size with greater than 50% respiratory variability, suggesting right atrial pressure of 3 mmHg. Comparison(s): Echocardiogram done 12/08/18 showed an EF of 55-60% with moderate to severe AI.  Assessment and Plan:  1. Permanent atrial fibrillation ([facility])   2. Essential hypertension   3. Mitral valve insufficiency, unspecified etiology   4. Bilateral lower extremity edema   5. Mixed hyperlipidemia  1. Permanent atrial fibrillation (HCC) Heart rate is 69 and irregularly irregular.  Continue diltiazem 240 mg daily, continue Xarelto 20 mg p.o. daily.  2. Essential hypertension Blood pressure is well controlled on current therapy at 140/82.  She states she has yet to take her antihypertensive medications this morning.  Continue diltiazem 240 mg p.o. daily.  Continue Benicar 40/12.5 mg p.o. daily  3. Moderate aortic insufficiency Recent echo 06/23/2019 demonstrated moderate aortic regurgitation.  No aortic stenosis. Recent echocardiogram 12/13/2020 demonstrated the aortic valve is tricuspid. Aortic valve regurgitation is moderate.  No aortic stenosis is present.   4. Mitral valve insufficiency, unspecified etiology Recent echocardiogram 06/23/2019 demonstrated moderate MR.  No evidence of mitral stenosis.  Recent echocardiogram 12/13/2020 demonstrated the mitral valve is abnormal. Mild mitral valve regurgitation. No  evidence of mitral stenosis  5. Bilateral lower extremity edema No evidence of lower extremity edema this morning.  6.  Hyperlipidemia. Continue simvastatin 20 mg p.o. daily.  Lipid panel on 09/03/2020: TC 178, TG 64, HDL 64, LDL 110  Medication Adjustments/Labs and Tests Ordered: Current medicines are reviewed at length with the patient today.  Concerns regarding medicines are outlined above.   Disposition: Follow-up with Dr. Harl Bowie or  APP 1 year  Signed, Levell July, NP 12/18/2020 8:29 AM    [facility] [place], [place],  [address] Phone: [phone]; Fax: [phone]

## 2020-12-18 ENCOUNTER — Encounter: Payer: Self-pay | Admitting: Family Medicine

## 2020-12-18 ENCOUNTER — Ambulatory Visit (INDEPENDENT_AMBULATORY_CARE_PROVIDER_SITE_OTHER): Payer: Medicare HMO | Admitting: Family Medicine

## 2020-12-18 VITALS — BP 140/82 | HR 69 | Ht 63.0 in | Wt 199.6 lb

## 2020-12-18 DIAGNOSIS — I10 Essential (primary) hypertension: Secondary | ICD-10-CM | POA: Diagnosis not present

## 2020-12-18 DIAGNOSIS — R600 Localized edema: Secondary | ICD-10-CM | POA: Diagnosis not present

## 2020-12-18 DIAGNOSIS — I340 Nonrheumatic mitral (valve) insufficiency: Secondary | ICD-10-CM | POA: Diagnosis not present

## 2020-12-18 DIAGNOSIS — I4821 Permanent atrial fibrillation: Secondary | ICD-10-CM

## 2020-12-18 DIAGNOSIS — E782 Mixed hyperlipidemia: Secondary | ICD-10-CM

## 2020-12-18 NOTE — Patient Instructions (Signed)

## 2020-12-19 ENCOUNTER — Telehealth: Payer: Self-pay | Admitting: *Deleted

## 2020-12-19 NOTE — Telephone Encounter (Signed)
-----   Message from Verta Ellen., NP sent at 12/13/2020  9:28 PM EST ----- Please call the patient and let her know the echocardiogram showed she has good pumping function of the heart.  The main pumping chamber is moderately more muscular than normal.  Best management for this this to keep blood pressure at or below 130/80 consistently and manage all other risk factors.  Left atrium is significantly dilated which is unchanged from prior echocardiogram.  She has a mild leak of the mitral valve which was present on previous echocardiogram.  She has some moderate leaking of the aortic valve which was present on previous echocardiogram.  Verta Ellen, NP  12/13/2020 9:25 PM

## 2020-12-19 NOTE — Telephone Encounter (Signed)
Laurine Blazer, LPN  85/88/5027  7:41 AM EST Back to Top    Discussed at [facility] yesterday with Katina Dung, NP.  Copy to pcp.

## 2021-01-22 ENCOUNTER — Telehealth: Payer: Self-pay | Admitting: *Deleted

## 2021-01-22 DIAGNOSIS — Z7901 Long term (current) use of anticoagulants: Secondary | ICD-10-CM

## 2021-01-22 DIAGNOSIS — Z79899 Other long term (current) drug therapy: Secondary | ICD-10-CM

## 2021-01-22 DIAGNOSIS — I4821 Permanent atrial fibrillation: Secondary | ICD-10-CM

## 2021-01-22 MED ORDER — RIVAROXABAN 20 MG PO TABS: 20.0000 mg | ORAL_TABLET | Freq: Every day | ORAL | 3 refills | Status: DC

## 2021-01-22 MED ORDER — RIVAROXABAN 20 MG PO TABS: 20.0000 mg | ORAL_TABLET | Freq: Every day | ORAL | 1 refills | Status: DC

## 2021-01-22 NOTE — Telephone Encounter (Signed)
Fax received from [facility] - request refill on Xarelto 20mg  daily for 90 day supply.

## 2021-01-22 NOTE — Telephone Encounter (Signed)
Patient notified and verbalized understanding.   She will do her labs at [facility] in the next week.   Xarelto refill sent to pharmacy.

## 2021-01-22 NOTE — Telephone Encounter (Signed)
Prescription refill request for Xarelto received.  Indication: Atrial fib Last office visit: 12/18/20  Lawanda Cousins FNP Weight: 90.5kg Age: 70 Scr: 1.24 on 06/25/20 CrCl: 58.59  Based on above findings Xarelto 20mg  daily is the appropriate dose.  Refill approved.  Pt is past due for lab work.  Message sent to [facility] LPN to schedule labs.

## 2021-02-12 ENCOUNTER — Other Ambulatory Visit: Payer: Self-pay | Admitting: *Deleted

## 2021-02-12 ENCOUNTER — Telehealth: Payer: Self-pay | Admitting: Student

## 2021-02-12 DIAGNOSIS — I10 Essential (primary) hypertension: Secondary | ICD-10-CM

## 2021-02-12 NOTE — Telephone Encounter (Signed)
Patient is calling requesting her results.

## 2021-02-12 NOTE — Telephone Encounter (Signed)
Addressed in result note.  

## 2021-02-19 ENCOUNTER — Telehealth: Payer: Self-pay | Admitting: Cardiology

## 2021-02-19 NOTE — Telephone Encounter (Signed)
Patient states she needs an order for lab blood to test her XARELTO.  She would like for it to be faxed to [facility], when that is done if someone can let her know.

## 2021-02-19 NOTE — Telephone Encounter (Signed)
Advised that lab order has been faxed to [facility]

## 2021-09-06 DIAGNOSIS — J432 Centrilobular emphysema: Secondary | ICD-10-CM | POA: Insufficient documentation

## 2021-09-06 DIAGNOSIS — I2510 Atherosclerotic heart disease of native coronary artery without angina pectoris: Secondary | ICD-10-CM | POA: Insufficient documentation

## 2021-09-06 HISTORY — DX: Atherosclerotic heart disease of native coronary artery without angina pectoris: I25.10

## 2021-12-12 ENCOUNTER — Ambulatory Visit (INDEPENDENT_AMBULATORY_CARE_PROVIDER_SITE_OTHER): Payer: Medicare Other

## 2021-12-12 ENCOUNTER — Encounter: Payer: Self-pay | Admitting: Orthopaedic Surgery

## 2021-12-12 ENCOUNTER — Ambulatory Visit (INDEPENDENT_AMBULATORY_CARE_PROVIDER_SITE_OTHER): Payer: Medicare Other | Admitting: Orthopaedic Surgery

## 2021-12-12 VITALS — Ht 62.0 in | Wt 198.0 lb

## 2021-12-12 DIAGNOSIS — M542 Cervicalgia: Secondary | ICD-10-CM | POA: Diagnosis not present

## 2021-12-12 DIAGNOSIS — M6281 Muscle weakness (generalized): Secondary | ICD-10-CM | POA: Diagnosis not present

## 2021-12-12 NOTE — Progress Notes (Signed)
Office Visit Note   Patient: Tracy Johnston           Date of Birth: Jul 13, 1950           MRN: [id number] Visit Date: 12/12/2021              Requested by: No referring provider defined for this encounter. PCP: Leeanne Rio, MD   Assessment & Plan: Visit Diagnoses:  1. Neck pain   2. Quadriceps weakness     Plan: Left quad weakness.  We went over multiple exercises she can do at [employer] where she normally attends.  We discussed knee presses knee extensions and wall squats.  She can use ankle weights that she has at home or purse across her ankle and do leg lifts for her left leg.  Understands will take several months to get her quad stronger.  We obtain C-spine x-rays with her gait disturbance.  She does have some spondylosis.  If she is still having gait problems after she gets her quad strong she can return.  She needs to continue to use her cane for fall prevention until her quad is strong.  Follow-Up Instructions: No follow-ups on file.   Orders:  Orders Placed This Encounter  Procedures   XR Cervical Spine 2 or 3 views   No orders of the defined types were placed in this encounter.     Procedures: No procedures performed   Clinical Data: No additional findings.   Subjective: Chief Complaint  Patient presents with   Left Leg - Pain   Right Leg - Pain    HPI 71 year old female returns with great difficulty walking.  She had 2 hip replacements and 1 left total knee arthroplasty in the was done last year in May.  She ambulates with a cane to prevent falling.  She cannot walk down the hill.  After hip replacements it was many months to gradually work her from a walker to a cane.  She states her left knee makes noise she feels the polyethylene popping.  She thinks it might be related to the cholesterol medicine she started from her PCP I discussed with her that is not the case.  She denies neck pain no numbness or tingling in her feet.  She does have severe  severe COPD has home oxygen and quit smoking a couple years ago at least.  Review of Systems all systems noncontributory HPI.   Objective: Vital Signs: Ht '5\' 2"'$  (1.575 m)   Wt 198 lb (89.8 kg)   BMI 36.21 kg/m   Physical Exam Constitutional:      Appearance: She is well-developed.  HENT:     Head: Normocephalic.     Right Ear: External ear normal.     Left Ear: External ear normal. There is no impacted cerumen.  Eyes:     Pupils: Pupils are equal, round, and reactive to light.  Neck:     Thyroid: No thyromegaly.     Trachea: No tracheal deviation.  Cardiovascular:     Rate and Rhythm: Normal rate.  Pulmonary:     Effort: Pulmonary effort is normal.  Abdominal:     Palpations: Abdomen is soft.  Musculoskeletal:     Cervical back: No rigidity.  Skin:    General: Skin is warm and dry.  Neurological:     Mental Status: She is alert and oriented to person, place, and time.  Psychiatric:        Behavior: Behavior normal.  Patient short of breath.  Intermittent coughing and late expiratory mild wheezing.  Ortho Exam patient has significant left quad weakness she can do a straight leg raise but I can overcome it with 1 finger.  Opposite right quad takes 2-3 fingers resistance.  No numbness or tingling in her fingers minimal brachial plexus tenderness.  Specialty Comments:  No specialty comments available.  Imaging: XR Cervical Spine 2 or 3 views  Result Date: 12/12/2021 AP lateral cervical spine images shows normal curvature.  Disc space narrowing and endplate spurring without subluxation at C5-6 and C6-7. Impression: Spondylosis as described above without subluxation.    PMFS History: Patient Active Problem List   Diagnosis Date Noted   Quadriceps weakness 10/18/2020   RUQ pain    Wheezing    S/P total knee arthroplasty, 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   BMI 33.0-33.9,adult 08/12/2018   HLD (hyperlipidemia) 08/12/2018   LLQ abdominal pain 08/12/2018   Mixed stress and urge  urinary incontinence 08/12/2018   History of bilateral hip arthroplasty 07/09/2017   Long-term current use of opiate analgesic 02/18/2017   On long term drug therapy 02/06/2017   Chronic low back pain 02/06/2017   Chronic pain syndrome 02/06/2017   Pain in left knee 11/22/2015   Incisional hernia, without obstruction or gangrene 09/09/2012   Acute blood loss anemia 10/20/2011    Class: Acute   COPD (chronic obstructive pulmonary disease) ([place]) 03/03/2011   Dyspnea 12/02/2010   Preop cardiovascular exam 10/31/2010   Claudication ([place]) 10/18/2010   Atrial fibrillation ([place])    Tobacco abuse    Hypertension    Past Medical History:  Diagnosis Date   Arthritis    Asthma    Atrial fibrillation (HCC)    chronic. Was first diagnosed in her early 41s.   Cancer ([place]) 2003ish   , cervix   COPD (chronic obstructive pulmonary disease) (HCC)    Dysrhythmia    Heart murmur    Hypertension    Insomnia    Malignant neoplasm of kidney ([place])    rt removed 2003   Mitral regurgitation    Mild to moderate. Normal EF 09/2010   Renal insufficiency    Sickle cell anemia (HCC)    Tobacco abuse     Family History  Problem Relation Age of Onset   Diabetes Mother    Cancer Mother        ovarian    Past Surgical History:  Procedure Laterality Date   ABDOMINAL HYSTERECTOMY     arm fracture     rods, pins and bone graft.- to left arm from hip   BONE GRAFT HIP ILIAC CREST     to left arm   CERVICAL CONIZATION W/BX     NEPHRECTOMY  2003   TONSILLECTOMY     TOTAL HIP ARTHROPLASTY  10/17/2011   Procedure: TOTAL HIP ARTHROPLASTY;  Surgeon: [name], MD;  Location: [place];  Service: Orthopedics;  Laterality: Left;  Left Total Hip Arthroplasty   TOTAL HIP ARTHROPLASTY  01/02/2012   Procedure: TOTAL HIP ARTHROPLASTY;  Surgeon: [name], MD;  Location: [place];  Service: Orthopedics;  Laterality: Right;  Right Total Hip Arthroplasty   TOTAL KNEE ARTHROPLASTY Left 06/15/2020   Procedure: LEFT  TOTAL KNEE ARTHROPLASTY;  Surgeon: [name], MD;  Location: [place];  Service: Orthopedics;  Laterality: Left;  Needs RNFA   TUBAL LIGATION     Social History   Occupational History   Not on file  Tobacco Use  Smoking status: Former    Packs/day: 1.00    Years: 40.00    Total pack years: 40.00    Types: Cigarettes    Start date: 10/21/1983    Quit date: 10/20/2017    Years since quitting: 4.1   Smokeless tobacco: Never   Tobacco comments:    just quit since getting out of hospital 9/24 restarted smoking 9 months ago-AJ  Vaping Use   Vaping Use: Never used  Substance and Sexual Activity   Alcohol use: Yes    Alcohol/week: 0.0 standard drinks of alcohol    Comment: occasions only 1 glass of wine   Drug use: No   Sexual activity: Not Currently

## 2021-12-24 NOTE — Progress Notes (Signed)
Cardiology Office Note   Date:  12/27/2021   ID:  Tracy Johnston, DOB July 23, 1950, MRN [id number]  PCP:  No primary care provider on file.  Cardiologist:   Dorris Carnes, MD   Pt returns for follow up of CHF  and atrial fibrillation       History of Present Illness: Tracy Johnston is a 71 y.o. female with a history of permanent atrial fib, HTN, mitral regurgitation, COPD (home O2), former smoker.    She was last seen in cardiology in Nov 2022   The pt was seen in [facility] in early November  for CHF  Given IV  lasix   Sent home   [facility] says that her breathing was better, that her ankles were swollen but now flat    After ER visit she said she was no longer havng that "crushing sensation" across her chest    Denies palpitations    NO dizziness   Bottome of feet hurt today  Current Meds  Medication Sig   allopurinol (ZYLOPRIM) 300 MG tablet TAKE 1 TABLET BY MOUTH DAILY. (Patient taking differently: Take 300 mg by mouth daily.)   colchicine 0.6 MG tablet Take 0.6 mg by mouth as needed.   diltiazem (CARDIZEM CD) 240 MG 24 hr capsule Take 1 capsule (240 mg total) by mouth daily.   Fluticasone-Umeclidin-Vilant (TRELEGY ELLIPTA) 100-62.5-25 MCG/INH AEPB Inhale 1 puff into the lungs daily.   ibandronate (BONIVA) 150 MG tablet Take 150 mg by mouth every 30 (thirty) days. Take in the morning with a full glass of water, on an empty stomach, and do not take anything else by mouth or lie down for the next 30 min.   ipratropium-albuterol (DUONEB) 0.5-2.5 (3) MG/3ML SOLN Inhale 3 mLs into the lungs every 6 (six) hours as needed for shortness of breath.   montelukast (SINGULAIR) 10 MG tablet Take 10 mg by mouth at bedtime as needed for allergies.   olmesartan-hydrochlorothiazide (BENICAR HCT) 40-12.5 MG per tablet Take 1 tablet by mouth daily.     Oxycodone HCl 10 MG TABS Take 1 tablet 4 times a day by oral route as needed.   PROAIR HFA 108 (90 Base) MCG/ACT inhaler Inhale 2 puffs into the lungs every  4 (four) hours as needed for shortness of breath or wheezing.   rivaroxaban (XARELTO) 20 MG TABS tablet Take 1 tablet (20 mg total) by mouth daily with supper.   [DISCONTINUED] predniSONE (DELTASONE) 20 MG tablet Take 20 mg by mouth daily as needed (short of breath).     Allergies:   Bupropion, Aspirin, and Latex   Past Medical History:  Diagnosis Date   Arthritis    Asthma    Atrial fibrillation (HCC)    chronic. Was first diagnosed in her early 2s.   Cancer ([place]) 2003ish   , cervix   COPD (chronic obstructive pulmonary disease) (HCC)    Dysrhythmia    Heart murmur    Hypertension    Insomnia    Malignant neoplasm of kidney ([place])    rt removed 2003   Mitral regurgitation    Mild to moderate. Normal EF 09/2010   Renal insufficiency    Sickle cell anemia (HCC)    Tobacco abuse     Past Surgical History:  Procedure Laterality Date   ABDOMINAL HYSTERECTOMY     arm fracture     rods, pins and bone graft.- to left arm from hip   BONE GRAFT HIP ILIAC CREST  to left arm   CERVICAL CONIZATION W/BX     NEPHRECTOMY  2003   TONSILLECTOMY     TOTAL HIP ARTHROPLASTY  10/17/2011   Procedure: TOTAL HIP ARTHROPLASTY;  Surgeon: [name], MD;  Location: [place];  Service: Orthopedics;  Laterality: Left;  Left Total Hip Arthroplasty   TOTAL HIP ARTHROPLASTY  01/02/2012   Procedure: TOTAL HIP ARTHROPLASTY;  Surgeon: [name], MD;  Location: [place];  Service: Orthopedics;  Laterality: Right;  Right Total Hip Arthroplasty   TOTAL KNEE ARTHROPLASTY Left 06/15/2020   Procedure: LEFT TOTAL KNEE ARTHROPLASTY;  Surgeon: [name], MD;  Location: [place];  Service: Orthopedics;  Laterality: Left;  Needs RNFA   TUBAL LIGATION       Social History:  The patient  reports that she quit smoking about 4 years ago. Her smoking use included cigarettes. She started smoking about 38 years ago. She has a 40.00 pack-year smoking history. She has never used smokeless tobacco. She reports current  alcohol use. She reports that she does not use drugs.   Family History:  The patient's family history includes Cancer in her mother; Diabetes in her mother.    ROS:  Please see the history of present illness. All other systems are reviewed and  Negative to the above problem except as noted.    PHYSICAL EXAM: VS:  BP 118/68   Pulse 84   Ht '5\' 4"'$  (1.626 m)   Wt 199 lb (90.3 kg)   SpO2 93%   BMI 34.16 kg/m    GEN:  Obese 71 yo in no acute distress  HEENT: normal  Neck: no JVD, carotid bruit Cardiac: Irreg irreg  Normal S1, S2  No definite murmurs      No LE edema     Respiratory:  Diffuse rhonchi, wheezes GI: soft, nontender, nondistended, + BS  No RUQ tenderneess MS: no deformity Moving all extremities   Skin: warm and dry, no rash Neuro:  Strength and sensation are intact Psych: euthymic mood, full affect   EKG:  EKG is ordered today.  Atrial fibrillation  84 bpm    Echocardiogram 12/13/2020  1. Left ventricular ejection fraction, by estimation, is 60 to 65%. The  left ventricle has normal function. The left ventricle has no regional  wall motion abnormalities. There is moderate left ventricular hypertrophy.  Left ventricular diastolic  parameters are indeterminate.   2. Right ventricular systolic function is normal. The right ventricular  size is normal. There is normal pulmonary artery systolic pressure.   3. Left atrial size was severely dilated.   4. Atrial septum bows to the right suggesting elevated LA pressure.   5. A small pericardial effusion is present. The pericardial effusion is  circumferential.   6. The mitral valve is abnormal. Mild mitral valve regurgitation. No  evidence of mitral stenosis.   7. The aortic valve is tricuspid. Aortic valve regurgitation is moderate.  No aortic stenosis is present.   8. The inferior vena cava is normal in size with greater than 50%  respiratory variability, suggesting right atrial pressure of 3 mmHg.        Lipid  Panel No results found for: "CHOL", "TRIG", "HDL", "CHOLHDL", "VLDL", "LDLCALC", "LDLDIRECT"    Wt Readings from Last 3 Encounters:  12/27/21 199 lb (90.3 kg)  12/12/21 198 lb (89.8 kg)  12/18/20 199 lb 9.6 oz (90.5 kg)      ASSESSMENT AND PLAN:  1. CHF   Pt with  recent ER visit for CHF   Legs no longer swollen    Not clear what triggered  Volume does not appear bad on exam Will get labs today   (BMET, BNP)   ALso repeat echo to confirm no change  2  Atrial fibrillation Continue rate control and anticoagulation  3  COPD    PT with diffuse wheezes   Does not sound good    Will start steroids   Continue inhalers   She has not been seen in pulmonary   Will refer  4  HTN  BP is controlled on current regimen     5  Hx mod AI     Will repeat echo       4. Mitral valve insufficiency Mild on echo   WIl lrepeat   5   Foot pain       Pain atypical but pulses are down    WIll get arterial dopplers     6.  Hyperlipidemia. Continue simvastatin 20 mg p.o. daily.   Follow up in late Jan  Current medicines are reviewed at length with the patient today.  The patient does not have concerns regarding medicines.  Signed, Dorris Carnes, MD  12/27/2021 9:21 AM    [address], [place], [place]  [address] Phone: [phone]; Fax: [phone]

## 2021-12-27 ENCOUNTER — Ambulatory Visit: Payer: Medicare Other | Attending: Internal Medicine | Admitting: Internal Medicine

## 2021-12-27 ENCOUNTER — Encounter: Payer: Self-pay | Admitting: Internal Medicine

## 2021-12-27 ENCOUNTER — Other Ambulatory Visit: Payer: Self-pay | Admitting: Internal Medicine

## 2021-12-27 ENCOUNTER — Telehealth: Payer: Self-pay | Admitting: Internal Medicine

## 2021-12-27 VITALS — BP 118/68 | HR 84 | Ht 64.0 in | Wt 199.0 lb

## 2021-12-27 DIAGNOSIS — M79604 Pain in right leg: Secondary | ICD-10-CM

## 2021-12-27 DIAGNOSIS — I4821 Permanent atrial fibrillation: Secondary | ICD-10-CM

## 2021-12-27 DIAGNOSIS — M79605 Pain in left leg: Secondary | ICD-10-CM

## 2021-12-27 DIAGNOSIS — I351 Nonrheumatic aortic (valve) insufficiency: Secondary | ICD-10-CM

## 2021-12-27 DIAGNOSIS — R0602 Shortness of breath: Secondary | ICD-10-CM | POA: Diagnosis not present

## 2021-12-27 MED ORDER — PREDNISONE 10 MG (21) PO TBPK: ORAL_TABLET | ORAL | 0 refills | Status: DC

## 2021-12-27 NOTE — Telephone Encounter (Signed)
tablets

## 2021-12-27 NOTE — Telephone Encounter (Signed)
Pt c/o medication issue:  1. Name of Medication:   predniSONE (STERAPRED UNI-PAK 21 TAB) 10 MG (21) TBPK tablet   2. How are you currently taking this medication (dosage and times per day)? Has not started as yet  3. Are you having a reaction (difficulty breathing--STAT)? N/A  4. What is your medication issue?   Caller stated they need clarification on the dosage for this medication.

## 2021-12-27 NOTE — Telephone Encounter (Signed)
Completed.

## 2021-12-27 NOTE — Telephone Encounter (Signed)
*  STAT* If patient is at the pharmacy, call can be transferred to refill team.   1. Which medications need to be refilled? (please list name of each medication and dose if known)   predniSONE (STERAPRED UNI-PAK 21 TAB) 10 MG (21) TBPK tablet     2. Which pharmacy/location (including street and city if local pharmacy) is medication to be sent to?   [facility]  [address], [place], [place] [address] Phone: [phone]  3. Do they need a 30 day or 90 day supply? 30  Pt would like medication sent to [facility], states she does not use [facility] anymore

## 2021-12-27 NOTE — Telephone Encounter (Signed)
Clarification voiced to pharmacy staff.

## 2021-12-27 NOTE — Patient Instructions (Addendum)
Medication Instructions:  Your physician recommends that you continue on your current medications as directed. Please refer to the Current Medication list given to you today.   Take Prednisone as directed   Labwork: Cbc,bmet,bnp  Testing/Procedures: Your physician has requested that you have an echocardiogram. Echocardiography is a painless test that uses sound waves to create images of your heart. It provides your doctor with information about the size and shape of your heart and how well your heart's chambers and valves are working. This procedure takes approximately one hour. There are no restrictions for this procedure. Please do NOT wear cologne, perfume, aftershave, or lotions (deodorant is allowed). Please arrive 15 minutes prior to your appointment time.   Your physician has requested that you have an ankle brachial index (ABI). During this test an ultrasound and blood pressure cuff are used to evaluate the arteries that supply the arms and legs with blood. Allow thirty minutes for this exam. There are no restrictions or special instructions.   Follow-Up: End of January 2024  Any Other Special Instructions Will Be Listed Below (If Applicable).   You have been referred to Pulmonary. They will call you to schedule appointment.   If you need a refill on your cardiac medications before your next appointment, please call your pharmacy.

## 2022-01-03 ENCOUNTER — Other Ambulatory Visit: Payer: Self-pay | Admitting: Internal Medicine

## 2022-01-03 DIAGNOSIS — M79604 Pain in right leg: Secondary | ICD-10-CM

## 2022-01-06 ENCOUNTER — Other Ambulatory Visit: Payer: Self-pay | Admitting: Internal Medicine

## 2022-01-06 DIAGNOSIS — M79605 Pain in left leg: Secondary | ICD-10-CM

## 2022-01-06 DIAGNOSIS — I739 Peripheral vascular disease, unspecified: Secondary | ICD-10-CM

## 2022-01-06 DIAGNOSIS — M79604 Pain in right leg: Secondary | ICD-10-CM

## 2022-01-09 ENCOUNTER — Ambulatory Visit (INDEPENDENT_AMBULATORY_CARE_PROVIDER_SITE_OTHER): Payer: Medicare Other

## 2022-01-09 ENCOUNTER — Ambulatory Visit: Payer: Medicare Other | Attending: Internal Medicine

## 2022-01-09 DIAGNOSIS — I351 Nonrheumatic aortic (valve) insufficiency: Secondary | ICD-10-CM | POA: Diagnosis not present

## 2022-01-09 DIAGNOSIS — M79604 Pain in right leg: Secondary | ICD-10-CM

## 2022-01-09 DIAGNOSIS — I739 Peripheral vascular disease, unspecified: Secondary | ICD-10-CM

## 2022-01-09 LAB — ECHOCARDIOGRAM COMPLETE
AR max vel: 2.26 cm2
AV Peak grad: 6.9 mmHg
AV Vena cont: 0.4 cm
Ao pk vel: 1.31 m/s
Calc EF: 56.3 %
MV M vel: 3.5 m/s
MV Peak grad: 49 mmHg
P 1/2 time: 702 msec
S' Lateral: 3 cm
Single Plane A2C EF: 53.4 %
Single Plane A4C EF: 58.7 %

## 2022-01-13 ENCOUNTER — Encounter: Payer: Self-pay | Admitting: *Deleted

## 2022-01-13 ENCOUNTER — Telehealth: Payer: Self-pay

## 2022-01-13 DIAGNOSIS — R0602 Shortness of breath: Secondary | ICD-10-CM

## 2022-01-13 DIAGNOSIS — E782 Mixed hyperlipidemia: Secondary | ICD-10-CM

## 2022-01-13 DIAGNOSIS — I351 Nonrheumatic aortic (valve) insufficiency: Secondary | ICD-10-CM

## 2022-01-13 MED ORDER — ROSUVASTATIN CALCIUM 10 MG PO TABS: 10.0000 mg | ORAL_TABLET | Freq: Every day | ORAL | 3 refills | Status: DC

## 2022-01-13 MED ORDER — FUROSEMIDE 20 MG PO TABS: 20.0000 mg | ORAL_TABLET | Freq: Every day | ORAL | 3 refills | Status: DC

## 2022-01-13 NOTE — Telephone Encounter (Signed)
Pt called about her Echo and vasc US reports and she is reporting that the ED put her on Lasix 20 mg daily on 12/17/21 but only gave her a 30 day supply and she was taken off of her Simvastatin and has been about a month due to leg pain and she is feeling better.. she is asking what to do next with both meds... I will forward to Dr Harrington Challenger for her review.

## 2022-01-13 NOTE — Telephone Encounter (Signed)
Pt has plaquing of aorta    should be on a statin   has she tried Crestor   Metabolized different from simvistatin Recomm 10 mg daily with lipomed and liver panel in 8 wks   2  When did she stop lasix    She could resume 20 mg    I would like to see a BMET and BNP in 10 days from restrating

## 2022-01-13 NOTE — Telephone Encounter (Signed)
Called and spoke with the patient.  She has not run out of Lasix and has not stopped it.  She will go to the [facility] in [place] tomorrow for bmet, bnp.    Lasix has helped her swelling a lot.   I refilled the lasix and sent new prescription for Crestor 10 mg daily.  Added atorvastatin to allergy list as intolerance --leg pain.  Discontinued simvastatin.  Will plan for patient to return to the [facility] in [place] for [employer] in 8 weeks.  She knows to go in one day around Valentines Day as long as she stays on Crestor and tolerating it.

## 2022-01-15 LAB — BASIC METABOLIC PANEL
BUN/Creatinine Ratio: 19 (ref 12–28)
BUN: 24 mg/dL (ref 8–27)
CO2: 23 mmol/L (ref 20–29)
Calcium: 9.7 mg/dL (ref 8.7–10.3)
Chloride: 105 mmol/L (ref 96–106)
Creatinine, Ser: 1.25 mg/dL — ABNORMAL HIGH (ref 0.57–1.00)
Glucose: 104 mg/dL — ABNORMAL HIGH (ref 70–99)
Potassium: 3.9 mmol/L (ref 3.5–5.2)
Sodium: 144 mmol/L (ref 134–144)
eGFR: 46 mL/min/{1.73_m2} — ABNORMAL LOW (ref >59)

## 2022-01-15 LAB — PRO B NATRIURETIC PEPTIDE: NT-Pro BNP: 1174 pg/mL — ABNORMAL HIGH (ref 0–301)

## 2022-01-15 NOTE — Telephone Encounter (Signed)
FLuid is up some     I would recomm taking 40 mg lasix every 3rd day   20 mg the other 2 days  Keep repreating    BMET and BNP in 10 days

## 2022-01-16 MED ORDER — FUROSEMIDE 20 MG PO TABS: ORAL_TABLET | ORAL | 3 refills | Status: DC

## 2022-01-16 NOTE — Telephone Encounter (Signed)
Patient returned RN's call regarding results. 

## 2022-01-16 NOTE — Telephone Encounter (Signed)
Patient notified and verbalized understanding. Pt agreeable to have labs completed at [facility] in [place] in 10 days. Pt agreeable to medication changes. Patient had no further questions or concerns at this time.

## 2022-01-17 ENCOUNTER — Telehealth: Payer: Self-pay

## 2022-01-17 ENCOUNTER — Other Ambulatory Visit: Payer: Self-pay

## 2022-01-17 ENCOUNTER — Telehealth: Payer: Self-pay | Admitting: Internal Medicine

## 2022-01-17 DIAGNOSIS — I351 Nonrheumatic aortic (valve) insufficiency: Secondary | ICD-10-CM

## 2022-01-17 DIAGNOSIS — R0602 Shortness of breath: Secondary | ICD-10-CM

## 2022-01-17 MED ORDER — FUROSEMIDE 40 MG PO TABS: 40.0000 mg | ORAL_TABLET | Freq: Every day | ORAL | 3 refills | Status: DC

## 2022-01-17 NOTE — Telephone Encounter (Signed)
Rx sent 

## 2022-01-17 NOTE — Telephone Encounter (Signed)
Patient notified and verbalized understanding. Patient had no questions or concerns at this time. PCP copied 

## 2022-01-17 NOTE — Telephone Encounter (Signed)
*  STAT* If patient is at the pharmacy, call can be transferred to refill team.   1. Which medications need to be refilled? (please list name of each medication and dose if known) furosemide (LASIX) 40 MG tablet   2. Which pharmacy/location (including street and city if local pharmacy) is medication to be sent to? [place], [place]   3. Do they need a 30 day or 90 day supply? [place]

## 2022-01-17 NOTE — Telephone Encounter (Signed)
-----   Message from Nuala Alpha, LPN sent at 83/46/2194  7:34 AM EST ----- Linna Hoff pt  ----- Message ----- From: Fay Records, MD Sent: 01/16/2022   2:28 PM EST To: Rebeca Alert [facility] Triage  Kidney function is stable from 1 year ago FLuid is up  I would increase furosemide to 40 mg daily  FOllow up BMET and BNP in 2 wks

## 2022-01-30 DIAGNOSIS — R0602 Shortness of breath: Secondary | ICD-10-CM | POA: Diagnosis not present

## 2022-02-03 ENCOUNTER — Telehealth: Payer: Self-pay | Admitting: Internal Medicine

## 2022-02-03 NOTE — Telephone Encounter (Signed)
Spoke to pt and verbalized that provider has not resulted labs from [facility] yet. Our office will give her a call with results when available.

## 2022-02-03 NOTE — Telephone Encounter (Signed)
Patient is calling stating she is returning a call she received from our office on 01/05 regarding lab results. Was unable to find documentation of who called. Please advise.

## 2022-02-10 NOTE — Progress Notes (Unsigned)
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   Sleeping as above without nocturnal  or early am exacerbation  of respiratory  c/o's or need for noct saba. Also denies any obvious fluctuation of symptoms with weather or environmental changes or other aggravating or alleviating factors except as outlined above   No unusual exposure hx or h/o childhood pna/ asthma or knowledge of premature birth.  Current Allergies, Complete Past Medical History, Past Surgical History, Family History, and Social History were reviewed in [employer] record.  ROS  The following are not active complaints unless bolded Hoarseness, sore throat, dysphagia, dental problems, itching, sneezing,  nasal congestion or discharge of excess mucus or purulent secretions, ear ache,   fever, chills, sweats, unintended wt loss or wt gain, classically pleuritic or exertional cp,  orthopnea pnd or arm/hand swelling  or leg swelling, presyncope, palpitations, abdominal pain, anorexia, nausea, vomiting, diarrhea  or change in bowel habits or change in bladder habits, change in  stools or change in urine, dysuria, hematuria,  rash, arthralgias, visual complaints, headache, numbness, weakness or ataxia or problems with walking/uses cane or coordination,  change in mood or  memory.           Past Medical History:  Diagnosis Date   Arthritis    Asthma    Atrial fibrillation (HCC)    chronic. Was first diagnosed in her early 69s.   Cancer ([place]) 2003ish   , cervix   COPD (chronic obstructive pulmonary disease) (HCC)    Dysrhythmia    Heart murmur    Hypertension    Insomnia    Malignant neoplasm of kidney ([place])    rt removed 2003   Mitral regurgitation    Mild to moderate. Normal EF 09/2010   Renal insufficiency    Sickle cell anemia (HCC)    Tobacco abuse     Outpatient Medications Prior to Visit  Medication Sig Dispense Refill   allopurinol (ZYLOPRIM) 300 MG tablet TAKE 1 TABLET BY MOUTH DAILY. (Patient taking differently: Take 300 mg by mouth daily.) 30 tablet 5   colchicine 0.6 MG tablet Take 0.6 mg by mouth as needed.     diltiazem (CARDIZEM CD) 240 MG 24 hr capsule Take 1 capsule (240 mg total) by mouth daily. 30 capsule 6   Fluticasone-Umeclidin-Vilant (TRELEGY ELLIPTA) 100-62.5-25 MCG/INH AEPB Inhale 1 puff into the lungs daily.     furosemide (LASIX) 40 MG tablet Take 1 tablet (  40 mg total) by mouth daily. 90 tablet 3   ibandronate (BONIVA) 150 MG tablet Take 150 mg by mouth every 30 (thirty) days. Take in the morning with a full glass of water, on an empty stomach, and do not take anything else by mouth or lie down for the next 30 min.     ipratropium-albuterol (DUONEB) 0.5-2.5 (3) MG/3ML SOLN Inhale 3 mLs into the lungs every 6 (six) hours as needed for shortness of breath.     montelukast (SINGULAIR) 10 MG tablet Take 10 mg by mouth at bedtime as needed for allergies.     olmesartan-hydrochlorothiazide (BENICAR HCT) 40-12.5 MG per tablet Take 1 tablet by mouth daily.       Oxycodone HCl 10 MG TABS Take 1 tablet 4 times a day by oral route as  needed.     polyethylene glycol (MIRALAX / GLYCOLAX) 17 g packet Take 17 g by mouth daily. 30 each 0   predniSONE (STERAPRED UNI-PAK 21 TAB) 10 MG (21) TBPK tablet PLEASE SEE ATTACHED FOR DETAILED DIRECTIONS 20 each 0   PROAIR HFA 108 (90 Base) MCG/ACT inhaler Inhale 2 puffs into the lungs every 4 (four) hours as needed for shortness of breath or wheezing.     rivaroxaban (XARELTO) 20 MG TABS tablet Take 1 tablet (20 mg total) by mouth daily with supper. 90 tablet 3   rosuvastatin (CRESTOR) 10 MG tablet Take 1 tablet (10 mg total) by mouth daily. 90 tablet 3   morphine (MSIR) 15 MG tablet  (Patient not taking: Reported on 02/11/2022)     MYRBETRIQ 50 MG TB24 tablet Take 50 mg by mouth daily.     No facility-administered medications prior to visit.     Objective:     BP 136/78   Pulse 64   Temp 98.2 F (36.8 C)   Ht '5\' 4"'$  (1.626 m)   Wt 202 lb 12.8 oz (92 kg)   SpO2 96% Comment: ra  BMI 34.81 kg/m   SpO2: 96 % (ra) amb bf nad     HEENT : Oropharynx  clear/edentulous        NECK :  without  apparent JVD/ palpable Nodes/TM    LUNGS: no acc muscle use,  Nl contour chest which is clear to A and P bilaterally without cough on insp or exp maneuvers   CV:  RRR  no s3 or murmur or increase in P2, and no edema   ABD:  soft and nontender    MS: slt awkard gait/ walks with cane/ ext warm without deformities Or obvious joint restrictions  calf tenderness, cyanosis or clubbing    SKIN: warm and dry without lesions    NEURO:  alert, approp, nl sensorium with  no motor or cerebellar deficits apparent.     I personally reviewed images and agree with radiology impression as follows:  CXR:   pa and lateral  12/17/21 Main finding is marked CM/ minimal ILD     I personally reviewed images and agree with radiology impression as follows:   Chest LDSCT     09/04/21 Tiny pulmonary nodule in the periphery of the right  upper lobe (axial image 88), with a volume derived mean diameter of  2.5 mm. No larger more suspicious appearing pulmonary nodules or masses are noted. No acute consolidative airspace disease. No pleural effusions. Diffuse bronchial wall thickening with mild centrilobular and paraseptal emphysema.         Assessment   COPD /  CB  Quit smoking 2019 with centrilobular emphysema on LDST 08/2021  - 02/11/2022  After extensive coaching inhaler device,  effectiveness =    75% with hfa and dpi   DDX of  difficult airways management almost all start with A and  include Adherence, Ace Inhibitors, Acid Reflux, Active Sinus Disease, Alpha 1 Antitripsin deficiency, Anxiety masquerading as Airways dz,  ABPA,  Allergy(esp in young), Aspiration (esp in elderly), Adverse effects of meds,  Active smoking or vaping, A bunch of PE's (a small clot burden can't cause this syndrome unless there is already severe underlying pulm or vascular dz with poor reserve) plus two Bs  = Bronchiectasis and Beta blocker use..and one C= CHF  Adherence is always the initial "prime suspect" and is a multilayered concern that requires a "trust but verify" approach in every patient - starting with knowing how to use medications, especially inhalers, correctly, keeping up with refills and understanding the fundamental difference between maintenance and prns vs those medications only taken for a very short course and then stopped and not refilled.  - see device teaching - return with all meds in hand using a trust but verify approach to confirm accurate Medication  Reconciliation The principal here is that until we are certain that the  patients are doing what we've asked, it makes no sense to ask them to do more.   ? Allergy/asthma > continue high dose ics and singulair for now  ? Adverse drug effects > none of the usual suspects listed though concerned about too much anticholinergic rx so use LAMA  and just saba as rescue, not sama (ipatrorpium)   ? A bunch of PE's > unlikely on DOAC  ? CHF/ cardiac  asthma > def a risk    For now  Group D (now reclassified as E) in terms of symptom/risk and laba/lama/ICS  therefore appropriate rx at this point >>>  trelegy and approp sba     F/u in 6 weeks, sooner if needed      Nocturnal hypoxemia On 2lpm HS at 1st pulmonary ov 02/11/2022   No change rx, continue to monitor sats at peak ex with goal of keeping > 90%    Each maintenance medication was reviewed in detail including emphasizing most importantly the difference between maintenance and prns and under what circumstances the prns are to be triggered using an action plan format where appropriate.  Total time for H and P, chart review, counseling, reviewing hfa/dpi/02/neb  device(s) and generating customized AVS unique to this office visit / same day charting = 49 min new pt eval      Christinia Gully, MD 02/11/2022

## 2022-02-11 ENCOUNTER — Encounter: Payer: Self-pay | Admitting: Internal Medicine

## 2022-02-11 ENCOUNTER — Ambulatory Visit (INDEPENDENT_AMBULATORY_CARE_PROVIDER_SITE_OTHER): Payer: 59 | Admitting: Internal Medicine

## 2022-02-11 VITALS — BP 136/78 | HR 64 | Temp 98.2°F | Ht 64.0 in | Wt 202.8 lb

## 2022-02-11 DIAGNOSIS — G4734 Idiopathic sleep related nonobstructive alveolar hypoventilation: Secondary | ICD-10-CM | POA: Diagnosis not present

## 2022-02-11 DIAGNOSIS — J449 Chronic obstructive pulmonary disease, unspecified: Secondary | ICD-10-CM

## 2022-02-11 MED ORDER — PREDNISONE 10 MG PO TABS: ORAL_TABLET | ORAL | 11 refills | Status: DC

## 2022-02-11 MED ORDER — ALBUTEROL SULFATE (2.5 MG/3ML) 0.083% IN NEBU: 2.5000 mg | INHALATION_SOLUTION | Freq: Four times a day (QID) | RESPIRATORY_TRACT | 12 refills | Status: AC | PRN

## 2022-02-11 NOTE — Assessment & Plan Note (Addendum)
Quit smoking 2019 with centrilobular emphysema on LDST 08/2021  - 02/11/2022  After extensive coaching inhaler device,  effectiveness =    75% with hfa and dpi   DDX of  difficult airways management almost all start with A and  include Adherence, Ace Inhibitors, Acid Reflux, Active Sinus Disease, Alpha 1 Antitripsin deficiency, Anxiety masquerading as Airways dz,  ABPA,  Allergy(esp in young), Aspiration (esp in elderly), Adverse effects of meds,  Active smoking or vaping, A bunch of PE's (a small clot burden can't cause this syndrome unless there is already severe underlying pulm or vascular dz with poor reserve) plus two Bs  = Bronchiectasis and Beta blocker use..and one C= CHF  Adherence is always the initial "prime suspect" and is a multilayered concern that requires a "trust but verify" approach in every patient - starting with knowing how to use medications, especially inhalers, correctly, keeping up with refills and understanding the fundamental difference between maintenance and prns vs those medications only taken for a very short course and then stopped and not refilled.  - see device teaching - return with all meds in hand using a trust but verify approach to confirm accurate Medication  Reconciliation The principal here is that until we are certain that the  patients are doing what we've asked, it makes no sense to ask them to do more.   ? Allergy/asthma > continue high dose ics and singulair for now  ? Adverse drug effects > none of the usual suspects listed though concerned about too much anticholinergic rx so use LAMA  and just saba as rescue, not sama (ipatrorpium)   ? A bunch of PE's > unlikely on DOAC  ? CHF/ cardiac asthma > def a risk    For now  Group D (now reclassified as E) in terms of symptom/risk and laba/lama/ICS  therefore appropriate rx at this point >>>  trelegy and approp sba     F/u in 6 weeks, sooner if needed

## 2022-02-11 NOTE — Patient Instructions (Addendum)
Plan A = Automatic = Always=    Trelegy 831 take one click 1st in am and 2 two breaths - tug hard to get it going  Work on inhaler technique:  relax and gently blow all the way out then take a nice smooth full deep breath back in - tugging at 1st to get it going  Hold breath in for at least  5 seconds if you can. Blow out trelegy  thru nose. Rinse and gargle with water when done.  If mouth or throat bother you at all,  try brushing teeth/gums/tongue with arm and hammer toothpaste/ make a slurry and gargle and spit out.     Plan B = Backup (to supplement plan A, not to replace it) Only use your albuterol inhaler as a rescue medication to be used if you can't catch your breath by resting or doing a relaxed purse lip breathing pattern.  - The less you use it, the better it will work when you need it. - Ok to use the inhaler up to 2 puffs  every 4 hours if you must but call for appointment if use goes up over your usual need - Don't leave home without it !!  (think of it like the spare tire for your car)   Plan C = Crisis (instead of Plan B but only if Plan B stops working) - only use your albuterol nebulizer if you first try Plan B and it fails to help > ok to use the nebulizer up to every 4-6 hours but if start needing it regularly call for immediate appointment   Plan D = Deltasone = prednisone(refillable)  If ABC not working, Prednisone 10 mg take  4 each am x 2 days,   2 each am x 2 days,  1 each am x 2 days and stop   For cough > mucinex 1200 mg every 12 hours as needed   Please schedule a follow up office visit in 6 weeks, call sooner if needed with all medications /inhalers/ solutions in hand so we can verify exactly what you are taking. This includes all medications from all doctors and over the counters

## 2022-02-11 NOTE — Assessment & Plan Note (Addendum)
On 2lpm HS at 1st pulmonary ov 02/11/2022   No change rx, continue to monitor sats at peak ex with goal of keeping > 90%    Each maintenance medication was reviewed in detail including emphasizing most importantly the difference between maintenance and prns and under what circumstances the prns are to be triggered using an action plan format where appropriate.  Total time for H and P, chart review, counseling, reviewing hfa/dpi/02/neb  device(s) and generating customized AVS unique to this office visit / same day charting = 49 min new pt eval

## 2022-02-17 DIAGNOSIS — G894 Chronic pain syndrome: Secondary | ICD-10-CM | POA: Diagnosis not present

## 2022-02-17 DIAGNOSIS — Z79891 Long term (current) use of opiate analgesic: Secondary | ICD-10-CM | POA: Diagnosis not present

## 2022-02-17 DIAGNOSIS — M542 Cervicalgia: Secondary | ICD-10-CM | POA: Diagnosis not present

## 2022-02-17 DIAGNOSIS — M5459 Other low back pain: Secondary | ICD-10-CM | POA: Diagnosis not present

## 2022-02-23 DIAGNOSIS — Z720 Tobacco use: Secondary | ICD-10-CM | POA: Diagnosis not present

## 2022-02-23 DIAGNOSIS — J439 Emphysema, unspecified: Secondary | ICD-10-CM | POA: Diagnosis not present

## 2022-03-10 DIAGNOSIS — Z79891 Long term (current) use of opiate analgesic: Secondary | ICD-10-CM | POA: Diagnosis not present

## 2022-03-10 DIAGNOSIS — M150 Primary generalized (osteo)arthritis: Secondary | ICD-10-CM | POA: Diagnosis not present

## 2022-03-10 DIAGNOSIS — I4891 Unspecified atrial fibrillation: Secondary | ICD-10-CM | POA: Diagnosis not present

## 2022-03-10 DIAGNOSIS — Z7901 Long term (current) use of anticoagulants: Secondary | ICD-10-CM | POA: Diagnosis not present

## 2022-03-10 DIAGNOSIS — J449 Chronic obstructive pulmonary disease, unspecified: Secondary | ICD-10-CM | POA: Diagnosis not present

## 2022-03-10 DIAGNOSIS — Z7983 Long term (current) use of bisphosphonates: Secondary | ICD-10-CM | POA: Diagnosis not present

## 2022-03-10 DIAGNOSIS — Z9181 History of falling: Secondary | ICD-10-CM | POA: Diagnosis not present

## 2022-03-10 DIAGNOSIS — I10 Essential (primary) hypertension: Secondary | ICD-10-CM | POA: Diagnosis not present

## 2022-03-10 DIAGNOSIS — Z7951 Long term (current) use of inhaled steroids: Secondary | ICD-10-CM | POA: Diagnosis not present

## 2022-03-26 DIAGNOSIS — Z720 Tobacco use: Secondary | ICD-10-CM | POA: Diagnosis not present

## 2022-03-26 DIAGNOSIS — J439 Emphysema, unspecified: Secondary | ICD-10-CM | POA: Diagnosis not present

## 2022-03-26 NOTE — Progress Notes (Unsigned)
Tracy Johnston, female    DOB: 09/30/1950    MRN: [id number]   Brief patient profile:  69 yobf  quit smoking 2019 with onset of cough/sob  referred to pulmonary clinic in 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  Cough: worse 1st thing in am / better p prednisone  Sleep: bed is flat/ 3 pillows under head s resp rx  SABA use: just trelegy / once neb qoweek 02: 2lpm / has meter says over 90s Lung cancer screen: per Ledell Noss Rec Plan A = Automatic = Always=    Trelegy [id number] take one click 1st in am and 2 two breaths - tug hard to get it going Work on inhaler technique:  Plan B = Backup (to supplement plan A, not to replace it) Only use your albuterol inhaler as a rescue medication Plan C = Crisis (instead of Plan B but only if Plan B stops working) - only use your albuterol nebulizer if you first try Plan B and it fails to help Plan D = Deltasone = prednisone(refillable)  If ABC not working, Prednisone 10 mg take  4 each am x 2 days,   2 each am x 2 days,  1 each am x 2 days and stop  For cough > mucinex 1200 mg every 12 hours as needed  Please schedule a follow up office visit in 6 weeks, call sooner if needed with all medications /inhalers/ solutions in hand      03/27/2022  f/u ov/Center office/Tracy Johnston re: COPD/CB with mod AI per permanent AF and noct hypoxemia  maint on Trelgy  / much better p prednisone x 6 days  did not bring meds  Chief Complaint  Patient presents with   Follow-up    No new concerns     Dyspnea:  walks with cough Cough: tends to be worse in am but all day long  min beige mucus  Sleeping: flat bed 3 pillows  SABA use: not using  02: 2lpm hs     No obvious day to day or daytime  variability or assoc  mucus plugs or hemoptysis or cp or chest tightness, subjective wheeze or overt sinus or hb symptoms.    Also denies any obvious fluctuation of symptoms with weather or environmental changes or other aggravating or alleviating factors except as outlined above   No unusual exposure hx or h/o childhood pna/ asthma or knowledge of premature birth.  Current Allergies, Complete Past Medical History, Past Surgical History, Family History, and Social History were reviewed in [employer] record.  ROS  The following are not active complaints unless bolded Hoarseness, sore throat, dysphagia, dental problems, itching, sneezing,  nasal congestion or discharge of excess mucus or purulent secretions, ear ache,   fever, chills, sweats, unintended wt loss or wt gain, classically pleuritic or exertional cp,  orthopnea pnd or arm/hand swelling  or leg swelling, presyncope, palpitations, abdominal pain, anorexia, nausea, vomiting, diarrhea  or change in bowel habits or change in bladder habits, change in stools or change in urine, dysuria, hematuria,  rash, arthralgias, visual complaints, headache, numbness, weakness or ataxia  or problems with walking or coordination,  change in mood or  memory.        Current Meds  Medication Sig   albuterol (PROVENTIL) (2.5 MG/3ML) 0.083% nebulizer solution Take 3 mLs (2.5 mg total) by nebulization every 6 (six) hours as needed for wheezing or shortness of breath.   allopurinol (ZYLOPRIM) 300 MG tablet TAKE 1 TABLET BY MOUTH DAILY. (Patient taking differently: Take 300 mg by mouth daily.)   colchicine 0.6 MG tablet Take 0.6 mg by mouth as needed.   diltiazem (CARDIZEM CD) 240 MG 24 hr capsule Take 1 capsule (240 mg total) by mouth daily.   Fluticasone-Umeclidin-Vilant (TRELEGY ELLIPTA) 100-62.5-25 MCG/INH AEPB Inhale 1 puff into the lungs daily.   furosemide (LASIX) 40 MG tablet Take 1 tablet (40 mg total) by mouth daily.   ibandronate  (BONIVA) 150 MG tablet Take 150 mg by mouth every 30 (thirty) days. Take in the morning with a full glass of water, on an empty stomach, and do not take anything else by mouth or lie down for the next 30 min.   montelukast (SINGULAIR) 10 MG tablet Take 10 mg by mouth at bedtime as needed for allergies.   morphine (MSIR) 15 MG tablet    olmesartan-hydrochlorothiazide (BENICAR HCT) 40-12.5 MG per tablet Take 1 tablet by mouth daily.     Oxycodone HCl 10 MG TABS Take 1 tablet 4 times a day by oral route as needed.   polyethylene glycol (MIRALAX / GLYCOLAX) 17 g packet Take 17 g by mouth daily.   PROAIR HFA 108 (90 Base) MCG/ACT inhaler Inhale 2 puffs into the lungs every 4 (four) hours as needed for shortness of breath or wheezing.   rivaroxaban (XARELTO) 20 MG TABS tablet Take 1 tablet (20 mg total) by mouth daily with supper.   rosuvastatin (CRESTOR) 10 MG tablet Take 1 tablet (10 mg total) by mouth daily.                   Past Medical History:  Diagnosis Date   Arthritis    Asthma    Atrial fibrillation (HCC)    chronic. Was first diagnosed in her early 51s.   Cancer ([place]) 2003ish   , cervix   COPD (chronic obstructive pulmonary disease) (HCC)    Dysrhythmia    Heart murmur    Hypertension    Insomnia    Malignant neoplasm of kidney ([place])    rt removed 2003   Mitral regurgitation    Mild to moderate. Normal EF 09/2010   Renal insufficiency    Sickle cell anemia (HCC)    Tobacco abuse        Objective:     Wt Readings from Last 3 Encounters:  03/27/22 207 lb (93.9 kg)  02/11/22 202 lb 12.8 oz (92 kg)  12/27/21 199 lb (90.3 kg)      Vital signs reviewed  03/27/2022  - Note at rest 02 sats  95% on RA   General appearance:    amb bf easily confused with details of care.  HEENT : Oropharynx  clear    NECK :  without  apparent JVD/ palpable Nodes/TM    LUNGS: no acc muscle use,  Mild barrel  contour chest wall with bilateral coarse insp/exp rhonchi  and  without  cough on insp or exp maneuvers  and mild  Hyperresonant  to  percussion bilaterally     CV:  IRIR  no s3 or murmur or increase in P2,  and no edema   ABD:  soft and nontender with pos end  insp Hoover's  in the supine position.  No bruits or organomegaly appreciated   MS:  Nl gait/ ext warm without deformities Or obvious joint restrictions  calf tenderness, cyanosis or clubbing     SKIN: warm and dry without lesions    NEURO:  alert, approp, nl sensorium with  no motor or cerebellar deficits apparent.        Rec cxr 03/27/2022 > did not go           Assessment

## 2022-03-27 ENCOUNTER — Ambulatory Visit (INDEPENDENT_AMBULATORY_CARE_PROVIDER_SITE_OTHER): Payer: 59 | Admitting: Internal Medicine

## 2022-03-27 ENCOUNTER — Encounter: Payer: Self-pay | Admitting: Internal Medicine

## 2022-03-27 VITALS — BP 132/68 | HR 72 | Ht 64.0 in | Wt 207.0 lb

## 2022-03-27 DIAGNOSIS — J449 Chronic obstructive pulmonary disease, unspecified: Secondary | ICD-10-CM

## 2022-03-27 DIAGNOSIS — G4734 Idiopathic sleep related nonobstructive alveolar hypoventilation: Secondary | ICD-10-CM | POA: Diagnosis not present

## 2022-03-27 MED ORDER — PREDNISONE 10 MG PO TABS: ORAL_TABLET | ORAL | 11 refills | Status: DC

## 2022-03-27 MED ORDER — PANTOPRAZOLE SODIUM 40 MG PO TBEC: 40.0000 mg | DELAYED_RELEASE_TABLET | Freq: Every day | ORAL | 2 refills | Status: DC

## 2022-03-27 MED ORDER — FAMOTIDINE 20 MG PO TABS: ORAL_TABLET | ORAL | 11 refills | Status: DC

## 2022-03-27 NOTE — Patient Instructions (Addendum)
Remember you have prednisone to use if breathing gets worse   My office will be contacting you by phone for referral for PFTs asap  - if you don't hear back from my office within one week please call us back or notify us thru MyChart and we'll address it right away.   Stop boniva for now  Pantoprazole (protonix) 40 mg   Take  30-60 min before first meal of the day and Pepcid (famotidine)  20 mg after supper until return to office - this is the best way to tell whether silent stomach acid is contributing to your problem.    Please schedule a follow up office visit in 4 weeks, sooner if needed - must bring all medications/ inhalers/solutions

## 2022-03-27 NOTE — Assessment & Plan Note (Addendum)
Quit smoking 2019 with centrilobular emphysema on LDST 08/2021  - 02/11/2022  After extensive coaching inhaler device,  effectiveness =    75% with hfa and dpi  -  03/27/2022   Walked on RA  x  3  lap(s) =  approx 450  ft  @ mod pace, stopped due to end of study with lowest 02 sats 91% and  mild sob p 300 ft but not need to stop  DDX of  difficult airways management almost all start with A and  include Adherence, Ace Inhibitors, Acid Reflux, Active Sinus Disease, Alpha 1 Antitripsin deficiency, Anxiety masquerading as Airways dz,  ABPA,  Allergy(esp in young), Aspiration (esp in elderly), Adverse effects of meds,  Active smoking or vaping, A bunch of PE's (a small clot burden can't cause this syndrome unless there is already severe underlying pulm or vascular dz with poor reserve) plus two Bs  = Bronchiectasis and Beta blocker use..and one C= CHF   Adherence is always the initial "prime suspect" and is a multilayered concern that requires a "trust but verify" approach in every patient - starting with knowing how to use medications, especially inhalers, correctly, keeping up with refills and understanding the fundamental difference between maintenance and prns vs those medications only taken for a very short course and then stopped and not refilled.  - reviewed optimal use of DPI  - again requested return with all meds in hand using a trust but verify approach to confirm accurate Medication  Reconciliation The principal here is that until we are certain that the  patients are doing what we've asked, it makes no sense to ask them to do more.   ? Acid (or non-acid) GERD > always difficult to exclude as up to 75% of pts in some series report no assoc GI/ Heartburn symptoms and she is on biphosphonates > rec rec for now hold boniva and max (24h)  acid suppression and diet restrictions/ reviewed and instructions given in writing.   ? Allegy / asthma :  note last Eos = 0.0 12/17/21 >  continue trelegy 100 /  singulair for now with back up = saba and plan D on there action plan= Prednisone 10 mg take  4 each am x 2 days,   2 each am x 2 days,  1 each am x 2 days and stop   ? A bunch of PEs > unlikely on xarelto   ? Chf > echo 01/10/22 1. Left ventricular ejection fraction, by estimation, is 55 to 60%. The  left ventricle has normal function. The left ventricle has no regional  wall motion abnormalities. Left ventricular diastolic function could not  be evaluated. The average  LV global longitudinal strain is -16.7 %. The global longitudinal  strain is abnormal.   2. Right ventricular systolic function is mildly reduced. The right  ventricular size is normal.   3. Left atrial size was severely dilated.   4. Right atrial size was mild to moderately dilated.   5. A small pericardial effusion is present. The pericardial effusion is  circumferential.   6. The mitral valve is grossly normal. Mild mitral valve regurgitation.  No evidence of mitral stenosis.   7. The aortic valve was not well visualized. Aortic valve regurgitation  is moderate. No aortic stenosis is present.   8. The inferior vena cava is normal in size with greater than 50%  respiratory variability, suggesting right atrial pressure of 3 mmHg  >>> rx per cards/ main  rx is keep bp and HR low    Rec cxr but did not go as rec/ will do next ov          Each maintenance medication was reviewed in detail including emphasizing most importantly the difference between maintenance and prns and under what circumstances the prns are to be triggered using an action plan format where appropriate.  Total time for H and P, chart review, counseling, reviewing hfa/dpi/neb/02 device(s) and generating customized AVS unique to this office visit / same day charting > 30 min for multiple chronic  refractory respiratory  symptoms of uncertain etiology

## 2022-03-27 NOTE — Assessment & Plan Note (Signed)
On 2lpm HS at 1st pulmonary ov 02/11/2022   - No decsats walking today so no need for amb 02/ continue 2lpm hs

## 2022-03-28 ENCOUNTER — Telehealth: Payer: Self-pay | Admitting: Internal Medicine

## 2022-03-28 NOTE — Telephone Encounter (Signed)
Called and confirmed for patient she can have PFT done at [facility]. Gave her RT number to call [phone] to schedule. Nothing further needed at this time.

## 2022-03-28 NOTE — Telephone Encounter (Signed)
PT calling to see when her PFT test will be done near the [place] office.Please call to advise @ [phone]

## 2022-04-02 ENCOUNTER — Telehealth: Payer: Self-pay | Admitting: Internal Medicine

## 2022-04-02 NOTE — Telephone Encounter (Signed)
Left message on machine the med is for cough, not heart burn

## 2022-04-02 NOTE — Telephone Encounter (Signed)
Patient is wanting to know why she needs to take Protonix and Pepcid. I read note from AVS. Pt advises she has no issues with her stomach and has no clue why this medication was prescribed.  Dr. Melvyn Novas can you please advise

## 2022-04-02 NOTE — Telephone Encounter (Signed)
Pt wants to know why was she sent pantoprazole (PROTONIX) 40 MG tablet [id number]  and famotidine (PEPCID) 20 MG tablet [id number] doesn't want to take it, until she know what it is for, pls advise.  Cell phone [phone]

## 2022-04-08 ENCOUNTER — Ambulatory Visit (HOSPITAL_COMMUNITY)
Admission: RE | Admit: 2022-04-08 | Discharge: 2022-04-08 | Disposition: A | Discharge status: Home / Self Care | Payer: 59 | Source: Ambulatory Visit | Attending: Internal Medicine | Admitting: Internal Medicine

## 2022-04-08 DIAGNOSIS — J449 Chronic obstructive pulmonary disease, unspecified: Secondary | ICD-10-CM | POA: Diagnosis not present

## 2022-04-08 DIAGNOSIS — J439 Emphysema, unspecified: Secondary | ICD-10-CM | POA: Diagnosis not present

## 2022-04-08 DIAGNOSIS — R059 Cough, unspecified: Secondary | ICD-10-CM | POA: Diagnosis not present

## 2022-04-08 LAB — PULMONARY FUNCTION TEST
DL/VA % pred: 84 %
DL/VA: 3.49 ml/min/mmHg/L
DLCO unc % pred: 51 %
DLCO unc: 9.87 ml/min/mmHg
FEF 25-75 Post: 0.61 L/sec
FEF 25-75 Pre: 0.82 L/sec
FEF2575-%Change-Post: -25 %
FEF2575-%Pred-Post: 34 %
FEF2575-%Pred-Pre: 46 %
FEV1-%Change-Post: -6 %
FEV1-%Pred-Post: 49 %
FEV1-%Pred-Pre: 52 %
FEV1-Post: 1.07 L
FEV1-Pre: 1.14 L
FEV1FVC-%Change-Post: -13 %
FEV1FVC-%Pred-Pre: 92 %
FEV6-%Change-Post: 3 %
FEV6-%Pred-Post: 61 %
FEV6-%Pred-Pre: 59 %
FEV6-Post: 1.69 L
FEV6-Pre: 1.63 L
FEV6FVC-%Change-Post: -1 %
FEV6FVC-%Pred-Post: 103 %
FEV6FVC-%Pred-Pre: 104 %
FVC-%Change-Post: 8 %
FVC-%Pred-Post: 61 %
FVC-%Pred-Pre: 56 %
FVC-Post: 1.77 L
FVC-Pre: 1.63 L
Post FEV1/FVC ratio: 60 %
Post FEV6/FVC ratio: 98 %
Pre FEV1/FVC ratio: 70 %
Pre FEV6/FVC Ratio: 100 %
RV % pred: 71 %
RV: 1.6 L
TLC % pred: 68 %
TLC: 3.47 L

## 2022-04-08 MED ORDER — ALBUTEROL SULFATE (2.5 MG/3ML) 0.083% IN NEBU
2.5000 mg | INHALATION_SOLUTION | Freq: Once | RESPIRATORY_TRACT | Status: AC
  Administered 2022-04-08: 2.5 mg via RESPIRATORY_TRACT

## 2022-04-22 DIAGNOSIS — I129 Hypertensive chronic kidney disease with stage 1 through stage 4 chronic kidney disease, or unspecified chronic kidney disease: Secondary | ICD-10-CM | POA: Diagnosis not present

## 2022-04-22 DIAGNOSIS — N183 Chronic kidney disease, stage 3 unspecified: Secondary | ICD-10-CM | POA: Diagnosis not present

## 2022-04-23 NOTE — Progress Notes (Signed)
Called pt and went over results with her. Before I could finish and make sure she understood, her phone hung up. I tried calling back x 2 and lune busy. Will call back.

## 2022-04-24 DIAGNOSIS — J439 Emphysema, unspecified: Secondary | ICD-10-CM | POA: Diagnosis not present

## 2022-04-24 DIAGNOSIS — Z720 Tobacco use: Secondary | ICD-10-CM | POA: Diagnosis not present

## 2022-05-04 NOTE — Progress Notes (Signed)
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/ uses [facility] parking  Cough: worse 1st thing in am / better p prednisone  Sleep: bed is flat/ 3 pillows under head s resp rx  SABA use: just trelegy / once neb qoweek 02: 2lpm / has meter says over 90s Lung cancer screen: per [facility] [place] [facility] Plan A = Automatic = Always=    Trelegy 100 take one click 1st in am and 2 two breaths - tug hard to get it going Work on inhaler technique:  Plan B = Backup (to supplement plan A, not to replace it) Only use your albuterol inhaler as a rescue medication Plan C = Crisis (instead of Plan B but only if Plan B stops working) - only use your albuterol nebulizer if you first try Plan B and it fails to help Plan D = Deltasone = prednisone(refillable)  If ABC not working, Prednisone 10 mg take  4 each am x 2 days,   2 each am x 2 days,  1 each am x 2 days and stop  For cough > mucinex 1200 mg every 12 hours as needed  Please schedule a follow up office visit in 6 weeks, call sooner if needed with all medications /inhalers/ solutions in hand      03/27/2022  f/u ov/[place] office/Melvenia Favela re: COPD/CB with mod AI per permanent AF and noct hypoxemia  maint on Trelgy  / much better p prednisone x 6 days  did not bring meds  Chief Complaint  Patient presents with   Follow-up    No new concerns     Dyspnea:  walks with cough Cough: tends to be worse in am but all day long  min beige mucus  Sleeping: flat bed 3 pillows  SABA use: not using  02: 2lpm hs  Rec Remember you have prednisone to  use if breathing gets worse  Stop boniva for now Pantoprazole (protonix) 40 mg   Take  30-60 min before first meal of the day and Pepcid (famotidine)  20 mg after supper until return to office  Please schedule a follow up office visit in 4 weeks, sooner if needed - must bring all medications/ inhalers/solutions   05/05/2022  f/u ov/[place] office/Yemariam Magar re: GOLD 0/ CB  maint on trelegy  did not bring  meds  Chief Complaint  Patient presents with   Follow-up    Pt f/u states that her breathing is baseline  Dyspnea:  [employer] / walks with cane  Treadmill 2 x treadmill x 5 min at 2 mph flat / slowed by L knee find  Cough: rattle in am no purulent sputum  Sleeping: flat bed/ 3 pillows  SABA use: not needing  02: prn    Lung cancer screening: [facility] due every August    No obvious day to day or daytime variability or assoc excess/ purulent sputum or mucus plugs or hemoptysis or cp or chest tightness,  subjective wheeze or overt sinus or hb symptoms.   Sleeping  without nocturnal  exacerbation  of respiratory  c/o's or need for noct saba. Also denies any obvious fluctuation of symptoms with weather or environmental changes or other aggravating or alleviating factors except as outlined above   No unusual exposure hx or h/o childhood pna/ asthma or knowledge of premature birth.  Current Allergies, Complete Past Medical History, Past Surgical History, Family History, and Social History were reviewed in [employer] record.  ROS  The following are not active complaints unless bolded Hoarseness, sore throat, dysphagia, dental problems, itching, sneezing,  nasal congestion or discharge of excess mucus or purulent secretions, ear ache,   fever, chills, sweats, unintended wt loss or wt gain, classically pleuritic or exertional cp,  orthopnea pnd or arm/hand swelling  or leg swelling, presyncope, palpitations, abdominal pain, anorexia, nausea, vomiting, diarrhea  or  change in bowel habits or change in bladder habits, change in stools or change in urine, dysuria, hematuria,  rash, arthralgias, visual complaints, headache, numbness, weakness or ataxia or problems with walking or coordination,  change in mood or  memory.        Current Meds  Medication Sig   albuterol (PROVENTIL) (2.5 MG/3ML) 0.083% nebulizer solution Take 3 mLs (2.5 mg total) by nebulization every 6 (six) hours as needed for wheezing or shortness of breath.   allopurinol (ZYLOPRIM) 300 MG tablet TAKE 1 TABLET BY MOUTH DAILY. (Patient taking differently: Take 300 mg by mouth daily.)   colchicine 0.6 MG tablet Take 0.6 mg by mouth as needed.   diltiazem (CARDIZEM CD) 240 MG 24 hr capsule Take 1 capsule (240 mg total) by mouth daily.   famotidine (PEPCID) 20 MG tablet One after supper   Fluticasone-Umeclidin-Vilant (TRELEGY ELLIPTA) 100-62.5-25 MCG/INH AEPB Inhale 1 puff into the lungs daily.   furosemide (LASIX) 40 MG tablet Take 1 tablet (40 mg total) by mouth daily.   montelukast (SINGULAIR) 10 MG tablet Take 10 mg by mouth at bedtime as needed for allergies.   olmesartan-hydrochlorothiazide (BENICAR HCT) 40-12.5 MG per tablet Take 1 tablet by mouth daily.     Oxycodone HCl 10 MG TABS Take 1 tablet 4 times a day by oral route as needed.   pantoprazole (PROTONIX) 40 MG tablet Take 1 tablet (40 mg total) by mouth daily. Take 30-60 min before first meal of the day   polyethylene glycol (MIRALAX / GLYCOLAX) 17 g packet Take 17 g by mouth daily.   PROAIR HFA 108 (90 Base) MCG/ACT inhaler Inhale 2 puffs into the lungs every 4 (four) hours as needed for shortness of breath or wheezing.   rivaroxaban (XARELTO) 20 MG TABS tablet Take 1 tablet (20 mg total) by mouth daily with supper.   rosuvastatin (CRESTOR) 10 MG tablet Take 1 tablet (10 mg total) by mouth daily.                     Past Medical History:  Diagnosis Date   Arthritis    Asthma    Atrial fibrillation (HCC)    chronic. Was  first diagnosed in her early 49s.   Cancer (HCC) 2003ish   , cervix   COPD (chronic obstructive pulmonary disease) (HCC)    Dysrhythmia    Heart murmur    Hypertension    Insomnia    Malignant neoplasm of kidney (HCC)    rt removed 2003   Mitral regurgitation    Mild to moderate. Normal  EF 09/2010   Renal insufficiency    Sickle cell anemia (HCC)    Tobacco abuse        Objective:     05/05/2022        206   03/27/22 207 lb (93.9 kg)  02/11/22 202 lb 12.8 oz (92 kg)  12/27/21 199 lb (90.3 kg)    Vital signs reviewed  05/05/2022  - Note at rest 02 sats  95% on RA   General appearance:    amb with cane/ slt awkward gait     HEENT : Oropharynx  clear     NECK :  without  apparent JVD/ palpable Nodes/TM    LUNGS: no acc muscle use,  Mild barrel  contour chest wall with bilateral  Distant bs s audible wheeze and  without cough on insp or exp maneuvers  and mild  Hyperresonant  to  percussion bilaterally     CV:  RRR  no s3 or murmur or increase in P2, and no edema   ABD:  soft and nontender with pos end  insp Hoover's  in the supine position.  No bruits or organomegaly appreciated   MS:  Nl gait/ ext warm without deformities Or obvious joint restrictions  calf tenderness, cyanosis or clubbing     SKIN: warm and dry without lesions    NEURO:  alert, approp, nl sensorium with  no motor or cerebellar deficits apparent.            I personally reviewed images and agree with radiology impression as follows:  CXR:   04/08/22 Emphysema without evidence of acute cardiopulmonary disease. Cardiomegaly           Assessment

## 2022-05-05 ENCOUNTER — Encounter: Payer: Self-pay | Admitting: Internal Medicine

## 2022-05-05 ENCOUNTER — Ambulatory Visit (INDEPENDENT_AMBULATORY_CARE_PROVIDER_SITE_OTHER): Payer: 59 | Admitting: Internal Medicine

## 2022-05-05 VITALS — BP 133/84 | HR 83 | Ht 64.0 in | Wt 206.4 lb

## 2022-05-05 DIAGNOSIS — F17210 Nicotine dependence, cigarettes, uncomplicated: Secondary | ICD-10-CM | POA: Diagnosis not present

## 2022-05-05 DIAGNOSIS — G4734 Idiopathic sleep related nonobstructive alveolar hypoventilation: Secondary | ICD-10-CM | POA: Diagnosis not present

## 2022-05-05 DIAGNOSIS — J449 Chronic obstructive pulmonary disease, unspecified: Secondary | ICD-10-CM

## 2022-05-05 NOTE — Assessment & Plan Note (Signed)
On 2lpm HS at 1st pulmonary ov 02/11/2022  --  03/27/2022   Walked on RA  x  3  lap(s) =  approx 450  ft  @ mod pace, stopped due to end of study with lowest 02 sats 91% and  mild sob p 300 ft but not need to stop  Doing fine on prn 02 at this point

## 2022-05-05 NOTE — Assessment & Plan Note (Signed)
Quit smoking 2019 with centrilobular emphysema on LDST 08/2021  - 02/11/2022  After extensive coaching inhaler device,  effectiveness =    75% with hfa and dpi  -  03/27/2022   Walked on RA  x  3  lap(s) =  approx 450  ft  @ mod pace, stopped due to end of study with lowest 02 sats 91% and  mild sob p 300 ft but not need to stop -  PFT's  04/08/22  FEV1 1.14 (52 % ) ratio 0.70  p 0 % improvement from saba p 0 prior to study with DLCO  9.87 (51%)   and FV curve min concave and ERV 39% at wt 200    Although not techically  copd by GOLD 1-4 criteria,  Group D (now reclassified as E) in terms of symptom/risk and laba/lama/ICS  therefore appropriate rx at this point >>>  trelegy and approp saba / reviewed

## 2022-05-05 NOTE — Assessment & Plan Note (Signed)
Counseled re importance of smoking cessation but did not meet time criteria for separate billing            Each maintenance medication was reviewed in detail including emphasizing most importantly the difference between maintenance and prns and under what circumstances the prns are to be triggered using an action plan format where appropriate.  Total time for H and P, chart review, counseling, reviewing hfa/dpi/02  device(s) and generating customized AVS unique to this office visit / same day charting = 25 min

## 2022-05-05 NOTE — Patient Instructions (Addendum)
Plan A = Automatic = Always=    Trelegy one click in am / singulair every pm   Plan B = Backup (to supplement plan A, not to replace it) Only use your albuterol inhaler as a rescue medication to be used if you can't catch your breath by resting or doing a relaxed purse lip breathing pattern.  - The less you use it, the better it will work when you need it. - Ok to use the inhaler up to 2 puffs  every 4 hours if you must but call for appointment if use goes up over your usual need - Don't leave home without it !!  (think of it like the spare tire for your car)   Plan C = Crisis (instead of Plan B but only if Plan B stops working) - only use your albuterol nebulizer if you first try Plan B and it fails to help > ok to use the nebulizer up to every 4 hours but if start needing it regularly call for immediate appointment  Also  Ok to try albuterol 15 min before an activity (on alternating days)  that you know would usually make you short of breath and see if it makes any difference and if makes none then don't take albuterol after activity unless you can't catch your breath as this means it's the resting that helps, not the albuterol.  Plan D :  prednisone x 6 days if ABC not working   Please schedule a follow up visit in 6 months but call sooner if needed

## 2022-06-03 ENCOUNTER — Other Ambulatory Visit: Payer: Self-pay | Admitting: Internal Medicine

## 2022-06-06 ENCOUNTER — Telehealth: Payer: Self-pay | Admitting: Student

## 2022-06-06 DIAGNOSIS — I4821 Permanent atrial fibrillation: Secondary | ICD-10-CM

## 2022-06-06 MED ORDER — RIVAROXABAN 20 MG PO TABS: 20.0000 mg | ORAL_TABLET | Freq: Every day | ORAL | 1 refills | Status: DC

## 2022-06-06 NOTE — Telephone Encounter (Signed)
*  STAT* If patient is at the pharmacy, call can be transferred to refill team.   1. Which medications need to be refilled? (please list name of each medication and dose if known)   rivaroxaban (XARELTO) 20 MG TABS tablet    2. Which pharmacy/location (including street and city if local pharmacy) is medication to be sent to? [facility] - [place], [place] - [address]   3. Do they need a 30 day or 90 day supply? 90 day   Pt out of medication

## 2022-06-06 NOTE — Telephone Encounter (Signed)
Prescription refill request for Xarelto received.  Indication: afib  Last office visit: Tenny Craw, 12/27/2021 Weight: 93.6 kg  Age: 72 yo  Scr: 1.19, 01/30/2022 CrCl: 62 ml/min

## 2022-06-13 ENCOUNTER — Other Ambulatory Visit: Payer: Self-pay | Admitting: Internal Medicine

## 2022-07-03 ENCOUNTER — Ambulatory Visit: Payer: 59 | Attending: Nurse Practitioner | Admitting: Nurse Practitioner

## 2022-07-03 ENCOUNTER — Encounter: Payer: Self-pay | Admitting: Nurse Practitioner

## 2022-07-03 VITALS — BP 148/88 | HR 60 | Ht 63.0 in | Wt 207.6 lb

## 2022-07-03 DIAGNOSIS — E782 Mixed hyperlipidemia: Secondary | ICD-10-CM

## 2022-07-03 DIAGNOSIS — Z720 Tobacco use: Secondary | ICD-10-CM

## 2022-07-03 DIAGNOSIS — I10 Essential (primary) hypertension: Secondary | ICD-10-CM

## 2022-07-03 DIAGNOSIS — N289 Disorder of kidney and ureter, unspecified: Secondary | ICD-10-CM

## 2022-07-03 DIAGNOSIS — Z79899 Other long term (current) drug therapy: Secondary | ICD-10-CM

## 2022-07-03 DIAGNOSIS — R600 Localized edema: Secondary | ICD-10-CM

## 2022-07-03 DIAGNOSIS — M79604 Pain in right leg: Secondary | ICD-10-CM | POA: Diagnosis not present

## 2022-07-03 DIAGNOSIS — I38 Endocarditis, valve unspecified: Secondary | ICD-10-CM

## 2022-07-03 DIAGNOSIS — M79605 Pain in left leg: Secondary | ICD-10-CM

## 2022-07-03 DIAGNOSIS — J449 Chronic obstructive pulmonary disease, unspecified: Secondary | ICD-10-CM

## 2022-07-03 DIAGNOSIS — I4821 Permanent atrial fibrillation: Secondary | ICD-10-CM

## 2022-07-03 NOTE — Patient Instructions (Addendum)
Medication Instructions:  Your physician recommends that you continue on your current medications as directed. Please refer to the Current Medication list given to you today.   Labwork: ProBNP, CMET, CBC to be done in 2-3 weeks at [facility] Fasting lipid panel and liver function to be done in about 6 months before follow up with Dr. Tenny Craw  Testing/Procedures: None  Follow-Up: Your physician recommends that you schedule a follow-up appointment in: 6 months  Any Other Special Instructions Will Be Listed Below (If Applicable).      If you need a refill on your cardiac medications before your next appointment, please call your pharmacy.

## 2022-07-03 NOTE — Progress Notes (Signed)
Office Visit    Patient Name: Tracy Johnston Date of Encounter: 07/03/2022  PCP:  Catalina Lunger, DO   [facility]  Cardiologist:  Dietrich Pates, MD  Advanced Practice Provider:  No care team member to display Electrophysiologist:  None   Chief Complaint    Tracy Johnston is a 72 y.o. female with a hx of HTN, permanent A-fib, re valvular insufficiency, tobacco abuse, CKD, HLD, and COPD with O2 use, who presents today for 6 month follow-up.    Past Medical History    Past Medical History:  Diagnosis Date   Arthritis    Asthma    Atrial fibrillation (HCC)    chronic. Was first diagnosed in her early 78s.   Cancer (HCC) 2003ish   , cervix   COPD (chronic obstructive pulmonary disease) (HCC)    Dysrhythmia    Heart murmur    Hypertension    Insomnia    Malignant neoplasm of kidney (HCC)    rt removed 2003   Mitral regurgitation    Mild to moderate. Normal EF 09/2010   Renal insufficiency    Sickle cell anemia (HCC)    Tobacco abuse    Past Surgical History:  Procedure Laterality Date   ABDOMINAL HYSTERECTOMY     arm fracture     rods, pins and bone graft.- to left arm from hip   BONE GRAFT HIP ILIAC CREST     to left arm   CERVICAL CONIZATION W/BX     NEPHRECTOMY  2003   TONSILLECTOMY     TOTAL HIP ARTHROPLASTY  10/17/2011   Procedure: TOTAL HIP ARTHROPLASTY;  Surgeon: Eldred Manges, MD;  Location: [facility] OR;  Service: Orthopedics;  Laterality: Left;  Left Total Hip Arthroplasty   TOTAL HIP ARTHROPLASTY  01/02/2012   Procedure: TOTAL HIP ARTHROPLASTY;  Surgeon: Eldred Manges, MD;  Location: [facility] OR;  Service: Orthopedics;  Laterality: Right;  Right Total Hip Arthroplasty   TOTAL KNEE ARTHROPLASTY Left 06/15/2020   Procedure: LEFT TOTAL KNEE ARTHROPLASTY;  Surgeon: Eldred Manges, MD;  Location: [facility] OR;  Service: Orthopedics;  Laterality: Left;  Needs RNFA   TUBAL LIGATION      Allergies  Allergies  Allergen Reactions   Bupropion Hives   Aspirin Other  (See Comments)    Bleeding at higher doses   Atorvastatin Other (See Comments)    Leg pains   Latex Rash    When she wears gloves    History of Present Illness    Tracy Johnston is a 72 y.o. female with a PMH as mentioned above.   Was seen at [facility] 11/20[facility] for CHF. Given IV diuresis and sent home.   Last seen by Dr. Tenny Craw on December 27, 2021. Was doing well at the time. Echo arranged and revealed EF normal, mildly reduced RVSF, mild MR, moderate AR, BAE, small pericardial effusion noted. Lower extremity ABI's were WNL.   Today she presents for 6 month follow-up. She states she is doing well. Denies any chest pain, shortness of breath, palpitations, syncope, presyncope, dizziness, orthopnea, PND, swelling or significant weight changes, acute bleeding, or claudication. She is going to [employer] regularly to increase her strength. Does admit to leg pain d/t leg edema. BP is elevated as she has not taken her blood pressure medications today.  Wears 2 L of oxygen via nasal cannula as needed.  Does smoke 1 to 2 cigarettes at times occasionally.  EKGs/Labs/Other Studies Reviewed:   The following  studies were reviewed today:   EKG:  EKG is not ordered today.     Echo 12/2021:  1. Left ventricular ejection fraction, by estimation, is 55 to 60%. The  left ventricle has normal function. The left ventricle has no regional  wall motion abnormalities. Left ventricular diastolic function could not  be evaluated. The average left  ventricular global longitudinal strain is -16.7 %. The global longitudinal  strain is abnormal.   2. Right ventricular systolic function is mildly reduced. The right  ventricular size is normal.   3. Left atrial size was severely dilated.   4. Right atrial size was mild to moderately dilated.   5. A small pericardial effusion is present. The pericardial effusion is  circumferential.   6. The mitral valve is grossly normal. Mild mitral valve regurgitation.  No  evidence of mitral stenosis.   7. The aortic valve was not well visualized. Aortic valve regurgitation  is moderate. No aortic stenosis is present.   8. The inferior vena cava is normal in size with greater than 50%  respiratory variability, suggesting right atrial pressure of 3 mmHg.   Comparison(s): Prior images reviewed side by side. Changes from prior  study are noted. Stable LVEF and stable moderate AR.  Myoview 04/2020: There was no ST segment deviation noted during stress. The study is normal. There are no perfusion defects consistent with prior infarct or current ischemia. This is a low risk study. The left ventricular ejection fraction is normal (55-65%).  Recent Labs: 01/14/2022: BUN 24; Creatinine, Ser 1.25; NT-Pro BNP 1,174; Potassium 3.9; Sodium 144  Recent Lipid Panel No results found for: "CHOL", "TRIG", "HDL", "CHOLHDL", "VLDL", "LDLCALC", "LDLDIRECT"  Risk Assessment/Calculations:   CHA2DS2-VASc Score = 3  This indicates a 3.2% annual risk of stroke. The patient's score is based upon: CHF History: 0 HTN History: 1 Diabetes History: 0 Stroke History: 0 Vascular Disease History: 0 Age Score: 1 Gender Score: 1  The 10-year ASCVD risk score (Arnett DK, et al., 2019) is: 27.8%   Values used to calculate the score:     Age: 37 years     Sex: Female     Is Non-Hispanic African American: Yes     Diabetic: No     Tobacco smoker: Yes     Systolic Blood Pressure: 148 mmHg     Is BP treated: Yes     HDL Cholesterol: 60 mg/dL     Total Cholesterol: 164 mg/dL   Home Medications   Current Meds  Medication Sig   albuterol (PROVENTIL) (2.5 MG/3ML) 0.083% nebulizer solution Take 3 mLs (2.5 mg total) by nebulization every 6 (six) hours as needed for wheezing or shortness of breath.   allopurinol (ZYLOPRIM) 300 MG tablet TAKE 1 TABLET BY MOUTH DAILY. (Patient taking differently: Take 300 mg by mouth daily.)   colchicine 0.6 MG tablet Take 0.6 mg by mouth as needed.    diltiazem (CARDIZEM CD) 240 MG 24 hr capsule Take 1 capsule (240 mg total) by mouth daily.   famotidine (PEPCID) 20 MG tablet One after supper   Fluticasone-Umeclidin-Vilant (TRELEGY ELLIPTA) 100-62.5-25 MCG/INH AEPB Inhale 1 puff into the lungs daily.   furosemide (LASIX) 40 MG tablet Take 1 tablet (40 mg total) by mouth daily.   GEMTESA 75 MG TABS Take 1 tablet by mouth daily.   montelukast (SINGULAIR) 10 MG tablet Take 10 mg by mouth at bedtime as needed for allergies.   olmesartan-hydrochlorothiazide (BENICAR HCT) 40-12.5 MG per tablet Take 1  tablet by mouth daily.     Oxycodone HCl 10 MG TABS Take 1 tablet 4 times a day by oral route as needed.   pantoprazole (PROTONIX) 40 MG tablet TAKE 1 TABLET BY MOUTH ONCE DAILY (take 30-60 minutes BEFORE first meal of THE DAY)   polyethylene glycol (MIRALAX / GLYCOLAX) 17 g packet Take 17 g by mouth daily.   predniSONE (DELTASONE) 10 MG tablet Take 10 mg by mouth daily as needed.   PROAIR HFA 108 (90 Base) MCG/ACT inhaler Inhale 2 puffs into the lungs every 4 (four) hours as needed for shortness of breath or wheezing.   rivaroxaban (XARELTO) 20 MG TABS tablet Take 1 tablet (20 mg total) by mouth daily with supper.   rosuvastatin (CRESTOR) 10 MG tablet Take 1 tablet (10 mg total) by mouth daily.     Review of Systems    All other systems reviewed and are otherwise negative except as noted above.  Physical Exam    VS:  BP (!) 148/88   Pulse 60   Ht 5\' 3"  (1.6 m)   Wt 207 lb 9.6 oz (94.2 kg)   SpO2 98%   BMI 36.77 kg/m  , BMI Body mass index is 36.77 kg/m.  Wt Readings from Last 3 Encounters:  07/03/22 207 lb 9.6 oz (94.2 kg)  05/05/22 206 lb 6.4 oz (93.6 kg)  03/27/22 207 lb (93.9 kg)    Repeat BP: 136/85  GEN: Well nourished, well developed, in no acute distress. HEENT: normal. Neck: Supple, no JVD, carotid bruits, or masses. Cardiac: S1/S2, irregular rhythm and regular rate, no murmurs, rubs, or gallops. No clubbing, cyanosis.  Nonpitting edema to BLE. Radials/PT 2+ and equal bilaterally.  Respiratory:  Respirations regular and unlabored, diminished wheezing to auscultation bilaterally. Smoker's cough noted.  MS: No deformity or atrophy. Skin: Warm and dry, no rash. Neuro:  Strength and sensation are intact. Psych: Normal affect.  Assessment & Plan    Permanent A-fib Denies any tachycardia or palpitations.  Heart rate well-controlled today.  Continue diltiazem.  Denies any bleeding issues on Xarelto.  Continue Xarelto, on appropriate dosage. Heart healthy diet and regular cardiovascular exercise encouraged.  Will obtain CBC.  HTN Blood pressure on arrival 148/88, repeat BP 136/85.  She has not taken her BP meds today. Continue current medication regimen. Discussed to monitor BP at home at least 2 hours after medications and sitting for 5-10 minutes. Heart healthy diet and regular cardiovascular exercise encouraged.  Will obtain CMET.  3. Leg edema, leg pain, medication management Leg pain is most likely due to leg edema.  ABIs previously obtained WNL.  Nonpitting edema noted to BLE.  Will obtain labs as mentioned above including proBNP. Continue Lasix 40 mg daily. If labs are WNL or stable, plan to increase Lasix to 60 mg daily x 3 days, then return to normal. Heart healthy diet and regular cardiovascular exercise encouraged. Encouraged wearing compression stockings.   4. HLD LDL from last year 104. Tolerating Crestor well. Will obtain labs prior to next OV. Heart healthy diet and regular cardiovascular exercise encouraged.   5. Renal insufficiency Obtaining labs as mentioned above.  Avoid nephrotoxic agents.  No medication changes at this time.  Continue follow-up with PCP.  6. Valvular insufficiency Echo 12/2021 revealed moderate aortic valve regurgitation, mild mitral valve regurgitation.  Patient is asymptomatic.  Recommend updating echocardiogram in the next 1 to 2 years or sooner if clinically  indicated.  7. COPD Denies any recent worsening symptoms  or recent acute exacerbations.  Continue to follow-up with Dr. Sherene Sires.  8.  Tobacco abuse Smoking cessation encouraged and discussed.     Disposition: Follow up in 6 month(s) with Dietrich Pates, MD or APP.  Signed, Sharlene Dory, NP 07/06/2022, 5:49 PM [facility]

## 2022-07-31 ENCOUNTER — Other Ambulatory Visit: Payer: Self-pay | Admitting: Family Medicine

## 2022-08-12 ENCOUNTER — Other Ambulatory Visit: Payer: Self-pay | Admitting: Family Medicine

## 2022-09-30 ENCOUNTER — Other Ambulatory Visit: Payer: Self-pay | Admitting: Family Medicine

## 2022-09-30 ENCOUNTER — Other Ambulatory Visit: Payer: Self-pay | Admitting: Internal Medicine

## 2022-09-30 DIAGNOSIS — M816 Localized osteoporosis [Lequesne]: Secondary | ICD-10-CM

## 2022-11-03 NOTE — Progress Notes (Unsigned)
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/ uses [facility] parking  Cough: worse 1st thing in am / better p prednisone  Sleep: bed is flat/ 3 pillows under head s resp rx  SABA use: just trelegy / once neb qoweek 02: 2lpm / has meter says over 90s Lung cancer screen: per Jonita [facility] Rec Plan A = Automatic = Always=    Trelegy 100 take one click 1st in am and 2 two breaths - tug hard to get it going Work on inhaler technique:  Plan B = Backup (to supplement plan A, not to replace it) Only use your albuterol inhaler as a rescue medication Plan C = Crisis (instead of Plan B but only if Plan B stops working) - only use your albuterol nebulizer if you first try Plan B and it fails to help Plan D = Deltasone = prednisone(refillable)  If ABC not working, Prednisone 10 mg take  4 each am x 2 days,   2 each am x 2 days,  1 each am x 2 days and stop  For cough > mucinex 1200 mg every 12 hours as needed  Please schedule a follow up office visit in 6 weeks, call sooner if needed with all medications /inhalers/ solutions in hand      03/27/2022  f/u ov/[place] office/Tracy Johnston re: COPD/CB with mod AI per permanent AF and noct hypoxemia  maint on Trelgy  / much better p prednisone x 6 days  did not bring meds  Chief Complaint  Patient presents with   Follow-up    No new concerns     Dyspnea:  walks with cough Cough: tends to be worse in am but all day long  min beige mucus  Sleeping: flat bed 3 pillows  SABA use: not using  02: 2lpm hs  Rec Remember you have prednisone to  use if breathing gets worse  Stop boniva for now Pantoprazole (protonix) 40 mg   Take  30-60 min before first meal of the day and Pepcid (famotidine)  20 mg after supper until return to office  Please schedule a follow up office visit in 4 weeks, sooner if needed - must bring all medications/ inhalers/solutions   05/05/2022  f/u ov/ office/Tracy Johnston re: GOLD 0/ CB  maint on trelegy  did not bring  meds  Chief Complaint  Patient presents with   Follow-up    Pt f/u states that her breathing is baseline  Dyspnea:  [employer] / walks with cane  Treadmill 2 x treadmill x 5 min at 2 mph flat / slowed by L knee find  Cough: rattle in am no purulent sputum  Sleeping: flat bed/ 3 pillows  SABA use: not needing  02: prn  Lung cancer screening: [place] due every August  Rec Plan A = Automatic = Always=    Trelegy one click in am / singulair every pm  Plan B = Backup (to supplement plan  A, not to replace it) Only use your albuterol inhaler as a rescue medication Plan C = Crisis (instead of Plan B but only if Plan B stops working) - only use your albuterol nebulizer if you first try Plan B  Also  Ok to try albuterol 15 min before an activity (on alternating days)  that you know would usually make you short of breath  Plan D :  prednisone x 6 days if ABC not working     11/04/2022  6 m  f/u ov/[place] office/Tracy Johnston re: GOLD 0/CB maint on trelegy / not following  ABCD instructions   Chief Complaint  Patient presents with   Follow-up  Dyspnea:  doing treadmill x 20 min / bike x 20 min x 3 per week  Cough: esp in am > clear  Sleeping: flat bed 3 pillows s resp cc  SABA use: used x one  02: 2lpm not dayime  Lung cancer screening: overdue    No obvious day to day or daytime variability or assoc excess/ purulent sputum or mucus plugs or hemoptysis or cp or chest tightness, subjective wheeze or overt sinus or hb symptoms.    Also denies any obvious fluctuation of symptoms with  weather or environmental changes or other aggravating or alleviating factors except as outlined above   No unusual exposure hx or h/o childhood pna/ asthma or knowledge of premature birth.  Current Allergies, Complete Past Medical History, Past Surgical History, Family History, and Social History were reviewed in [employer] record.  ROS  The following are not active complaints unless bolded Hoarseness, sore throat, dysphagia, dental problems, itching, sneezing,  nasal congestion or discharge of excess mucus or purulent secretions, ear ache,   fever, chills, sweats, unintended wt loss or wt gain, classically pleuritic or exertional cp,  orthopnea pnd or arm/hand swelling  or leg swelling, presyncope, palpitations, abdominal pain, anorexia, nausea, vomiting, diarrhea  or change in bowel habits or change in bladder habits, change in stools or change in urine, dysuria, hematuria,  rash, arthralgias, visual complaints, headache, numbness, weakness or ataxia or problems with walking or coordination,  change in mood or  memory.        Current Meds  Medication Sig   albuterol (PROVENTIL) (2.5 MG/3ML) 0.083% nebulizer solution Take 3 mLs (2.5 mg total) by nebulization every 6 (six) hours as needed for wheezing or shortness of breath.   allopurinol (ZYLOPRIM) 300 MG tablet TAKE 1 TABLET BY MOUTH DAILY. (Patient taking differently: Take 300 mg by mouth daily.)   colchicine 0.6 MG tablet Take 0.6 mg by mouth as needed.   diltiazem (CARDIZEM CD) 240 MG 24 hr capsule Take 1 capsule (240 mg total) by mouth daily.   famotidine (PEPCID) 20 MG tablet One after supper   Fluticasone-Umeclidin-Vilant (TRELEGY ELLIPTA) 100-62.5-25 MCG/INH AEPB Inhale 1 puff into the lungs daily.   furosemide (LASIX) 40 MG tablet Take 1 tablet (40 mg total) by mouth daily.   GEMTESA 75 MG TABS Take 1 tablet by mouth daily.   olmesartan-hydrochlorothiazide (BENICAR HCT) 40-12.5 MG per tablet Take 1 tablet by  mouth daily.     Oxycodone HCl 10 MG TABS Take 1 tablet 4 times a day by oral route as needed.   pantoprazole (PROTONIX) 40 MG tablet TAKE 1 TABLET BY MOUTH ONCE DAILY 30-60 MINUTES BEFORE first meal of THE DAY   polyethylene glycol (MIRALAX / GLYCOLAX) 17 g packet Take 17 g by mouth daily.   predniSONE (DELTASONE) 10  MG tablet Take 10 mg by mouth daily as needed.   PROAIR HFA 108 (90 Base) MCG/ACT inhaler Inhale 2 puffs into the lungs every 4 (four) hours as needed for shortness of breath or wheezing.   rivaroxaban (XARELTO) 20 MG TABS tablet Take 1 tablet (20 mg total) by mouth daily with supper.   rosuvastatin (CRESTOR) 10 MG tablet Take 1 tablet (10 mg total) by mouth daily.   [DISCONTINUED] montelukast (SINGULAIR) 10 MG tablet Take 10 mg by mouth at bedtime as needed for allergies.                     Past Medical History:  Diagnosis Date   Arthritis    Asthma    Atrial fibrillation (HCC)    chronic. Was first diagnosed in her early 4s.   Cancer (HCC) 2003ish   , cervix   COPD (chronic obstructive pulmonary disease) (HCC)    Dysrhythmia    Heart murmur    Hypertension    Insomnia    Malignant neoplasm of kidney (HCC)    rt removed 2003   Mitral regurgitation    Mild to moderate. Normal EF 09/2010   Renal insufficiency    Sickle cell anemia (HCC)    Tobacco abuse        Objective:    Wts  11/04/2022       ***  05/05/2022        206   03/27/22 207 lb (93.9 kg)  02/11/22 202 lb 12.8 oz (92 kg)  12/27/21 199 lb (90.3 kg)    Vital signs reviewed  11/04/2022  - Note at rest 02 sats  ***% on ***   General appearance:    congested cough / min exp rhonchi       Mild bar ***  mild buffaloe hump                  Assessment

## 2022-11-04 ENCOUNTER — Encounter: Payer: Self-pay | Admitting: Internal Medicine

## 2022-11-04 ENCOUNTER — Ambulatory Visit: Payer: 59 | Admitting: Internal Medicine

## 2022-11-04 VITALS — BP 131/78 | HR 74 | Ht 63.0 in | Wt 208.0 lb

## 2022-11-04 DIAGNOSIS — J449 Chronic obstructive pulmonary disease, unspecified: Secondary | ICD-10-CM | POA: Diagnosis not present

## 2022-11-04 DIAGNOSIS — G4734 Idiopathic sleep related nonobstructive alveolar hypoventilation: Secondary | ICD-10-CM

## 2022-11-04 DIAGNOSIS — F17210 Nicotine dependence, cigarettes, uncomplicated: Secondary | ICD-10-CM | POA: Diagnosis not present

## 2022-11-04 MED ORDER — MONTELUKAST SODIUM 10 MG PO TABS: 10.0000 mg | ORAL_TABLET | Freq: Every evening | ORAL | 11 refills | Status: DC | PRN

## 2022-11-04 MED ORDER — IPRATROPIUM-ALBUTEROL 0.5-2.5 (3) MG/3ML IN SOLN
3.0000 mL | Freq: Four times a day (QID) | RESPIRATORY_TRACT | 11 refills | Status: DC
Start: 2022-11-04 — End: 2022-12-31

## 2022-11-04 MED ORDER — BUDESONIDE 0.25 MG/2ML IN SUSP
0.2500 mg | Freq: Four times a day (QID) | RESPIRATORY_TRACT | 12 refills | Status: DC
Start: 2022-11-04 — End: 2022-11-06

## 2022-11-04 NOTE — Patient Instructions (Addendum)
Stop trelegy   Duoneb  up to 4 x daily with budesonide  0.25 with each dose   As needed albuterol up to every 4 hours if you can't catch your breath  Only use prednisone if above not working to your satisfaction   Please remember to go to the lab department   for your tests - we will call you with the results when they are available.      Call for your CT scan as per your letter   Please schedule a follow up office visit in 4 weeks, sooner if needed  with all medications /inhalers/ solutions in hand so we can verify exactly what you are taking. This includes all medications from all doctors and over the counters

## 2022-11-05 ENCOUNTER — Telehealth: Payer: Self-pay | Admitting: Internal Medicine

## 2022-11-05 NOTE — Assessment & Plan Note (Signed)
Counseled re importance of smoking cessation but did not meet time criteria for separate billing    Low-dose CT lung cancer screening is recommended for patients who are 77-74 years of age with a 20+ pack-year history of smoking and who are currently smoking or quit <=15 years ago. No coughing up blood  No unintentional weight loss of > 15 pounds in the last 6 months - pt is eligible for scanning yearly until age 9 > rec resume program (over due by 6 m at [facility])   Discussed in detail all the  indications, usual  risks and alternatives  relative to the benefits with patient who agrees to proceed with w/u as outlined.            Each maintenance medication was reviewed in detail including emphasizing most importantly the difference between maintenance and prns and under what circumstances the prns are to be triggered using an action plan format where appropriate.  Total time for H and P, chart review, counseling, reviewing hfa/dpi/neb/ 02  device(s) and generating customized AVS unique to this office visit / same day charting = 32 min

## 2022-11-05 NOTE — Assessment & Plan Note (Addendum)
Active smoker with centrilobular emphysema on LDST 08/2021  - 02/11/2022  After extensive coaching inhaler device,  effectiveness =    75% with hfa and dpi  -  03/27/2022   Walked on RA  x  3  lap(s) =  approx 450  ft  @ mod pace, stopped due to end of study with lowest 02 sats 91% and  mild sob p 300 ft but not need to stop -  PFT's  04/08/22  FEV1 1.14 (52 % ) ratio 0.70  p 0 % improvement from saba p 0 prior to study with DLCO  9.87 (51%)   and FV curve min concave and ERV 39% at wt 200    - Allergy screen 11/04/2022 >  Eos 0. /  IgE   - 11/04/2022 changed to duoneb up to qid with budesonide 0.25 mg in place of trelegy as does not fit criteria for copd and more of an AB pattern  for which dpi may aggravate the cough component and added singulair trial as well to see if can reduce prednisone dependency   F/u in 4 weeks with all meds in hand using a trust but verify approach to confirm accurate Medication  Reconciliation The principal here is that until we are certain that the  patients are doing what we've asked, it makes no sense to ask them to do more.

## 2022-11-05 NOTE — Telephone Encounter (Signed)
Patient was here yesterday and saw Dr. Stan Head prescribed Pulmicort nebulizer solution.  The copayment is $52.00 at [facility] and she would like for [place] to call a new prescribtion to [facility] in [place]  (the copay is cheaper).  Patient's phone numbers: Home--[phone] and cell phone is [phone]

## 2022-11-05 NOTE — Assessment & Plan Note (Signed)
On 2lpm HS at 1st pulmonary ov 02/11/2022  --  03/27/2022   Walked on RA  x  3  lap(s) =  approx 450  ft  @ mod pace, stopped due to end of study with lowest 02 sats 91% and  mild sob p 300 ft but not need to stop  Advised no change 02 2lpm hs / no needed daytime with good ex tol reported RA

## 2022-11-06 ENCOUNTER — Other Ambulatory Visit: Payer: Self-pay

## 2022-11-06 ENCOUNTER — Telehealth: Payer: Self-pay

## 2022-11-06 DIAGNOSIS — J449 Chronic obstructive pulmonary disease, unspecified: Secondary | ICD-10-CM

## 2022-11-06 LAB — IGE: IgE (Immunoglobulin E), Serum: 100 [IU]/mL (ref 6–495)

## 2022-11-06 LAB — CBC WITH DIFFERENTIAL/PLATELET
Basophils Absolute: 0 10*3/uL (ref 0.0–0.2)
Basos: 0 %
EOS (ABSOLUTE): 0.1 10*3/uL (ref 0.0–0.4)
Eos: 1 %
Hematocrit: 48.9 % — ABNORMAL HIGH (ref 34.0–46.6)
Hemoglobin: 16.2 g/dL — ABNORMAL HIGH (ref 11.1–15.9)
Immature Grans (Abs): 0 10*3/uL (ref 0.0–0.1)
Immature Granulocytes: 0 %
Lymphocytes Absolute: 0.9 10*3/uL (ref 0.7–3.1)
Lymphs: 10 %
MCH: 30.7 pg (ref 26.6–33.0)
MCHC: 33.1 g/dL (ref 31.5–35.7)
MCV: 93 fL (ref 79–97)
Monocytes Absolute: 0.6 10*3/uL (ref 0.1–0.9)
Monocytes: 6 %
Neutrophils Absolute: 7.4 10*3/uL — ABNORMAL HIGH (ref 1.4–7.0)
Neutrophils: 83 %
Platelets: 186 10*3/uL (ref 150–450)
RBC: 5.27 x10E6/uL (ref 3.77–5.28)
RDW: 14.9 % (ref 11.7–15.4)
WBC: 9 10*3/uL (ref 3.4–10.8)

## 2022-11-06 MED ORDER — BUDESONIDE 0.25 MG/2ML IN SUSP: 0.2500 mg | Freq: Four times a day (QID) | RESPIRATORY_TRACT | 12 refills | Status: DC

## 2022-11-06 NOTE — Telephone Encounter (Signed)
Called to let pat know medication has been sent

## 2022-11-06 NOTE — Telephone Encounter (Signed)
Sent medication into [facility] in [place]

## 2022-11-06 NOTE — Telephone Encounter (Signed)
-----   Message from Tracy Johnston sent at 11/06/2022 12:11 PM EDT ----- Call patient :  Studies are unremarkable, no change in recs

## 2022-11-06 NOTE — Telephone Encounter (Signed)
I left a message for the patient to return my call.

## 2022-11-18 ENCOUNTER — Telehealth: Payer: Self-pay | Admitting: Internal Medicine

## 2022-11-18 NOTE — Telephone Encounter (Signed)
Patient wants to know when she should have lab work done prior to her appointment on 12/2.  Patient wants to have lab work done in [place].

## 2022-11-18 NOTE — Telephone Encounter (Signed)
Patient scheduled to see Dr. Tenny Craw in December.  Does she need any labs prior to this.

## 2022-11-19 NOTE — Telephone Encounter (Signed)
 Patient notified and verbalized understanding. Patient had no further questions or concerns at this time.

## 2022-11-19 NOTE — Telephone Encounter (Signed)
Patient called to follow-up on when she should have her lab work done.

## 2022-11-19 NOTE — Telephone Encounter (Signed)
Per d/c summary of last ov with E. Philis Nettle, NP:  Fasting lipid panel and liver function to be done in about 6 months before follow up with Dr. Tenny Craw   Will order FLP/LFT.

## 2022-11-27 DIAGNOSIS — K8020 Calculus of gallbladder without cholecystitis without obstruction: Secondary | ICD-10-CM | POA: Insufficient documentation

## 2022-11-29 ENCOUNTER — Other Ambulatory Visit: Payer: Self-pay | Admitting: Student

## 2022-11-29 ENCOUNTER — Other Ambulatory Visit: Payer: Self-pay | Admitting: Internal Medicine

## 2022-11-29 DIAGNOSIS — I4821 Permanent atrial fibrillation: Secondary | ICD-10-CM

## 2022-12-01 NOTE — Telephone Encounter (Signed)
Prescription refill request for Xarelto received.  Indication:afib Last office visit:6/24 Weight:94.3  kg Age:72 Scr:1.09  6/24 CrCl:67.41  ml/min  Prescription refilled

## 2022-12-02 ENCOUNTER — Encounter: Payer: Self-pay | Admitting: Internal Medicine

## 2022-12-03 ENCOUNTER — Encounter: Payer: Self-pay | Admitting: Internal Medicine

## 2022-12-03 ENCOUNTER — Ambulatory Visit (INDEPENDENT_AMBULATORY_CARE_PROVIDER_SITE_OTHER): Payer: 59 | Admitting: Internal Medicine

## 2022-12-03 VITALS — BP 139/76 | HR 72 | Ht 63.0 in | Wt 206.0 lb

## 2022-12-03 DIAGNOSIS — J449 Chronic obstructive pulmonary disease, unspecified: Secondary | ICD-10-CM

## 2022-12-03 MED ORDER — ALBUTEROL SULFATE HFA 108 (90 BASE) MCG/ACT IN AERS: INHALATION_SPRAY | RESPIRATORY_TRACT | 11 refills | Status: AC

## 2022-12-03 MED ORDER — PREDNISONE 10 MG PO TABS: ORAL_TABLET | ORAL | 1 refills | Status: DC

## 2022-12-03 MED ORDER — ALBUTEROL SULFATE HFA 108 (90 BASE) MCG/ACT IN AERS: 2.0000 | INHALATION_SPRAY | Freq: Four times a day (QID) | RESPIRATORY_TRACT | 6 refills | Status: DC | PRN

## 2022-12-03 NOTE — Progress Notes (Signed)
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[facility] parking  Cough: worse 1st thing in am / better p prednisone  Sleep: bed is flat/ 3 pillows under head s resp rx  SABA use: just trelegy / once neb qoweek 02: 2lpm / has meter says over 90s Lung cancer screen: per Jonita Albee Rec Plan A = Automatic = Always=    Trelegy 100 take one click 1st in am and 2 two breaths - tug hard to get it going Work on inhaler technique:  Plan B = Backup (to supplement plan A, not to replace it) Only use your albuterol inhaler as a rescue medication Plan C = Crisis (instead of Plan B but only if Plan B stops working) - only use your albuterol nebulizer if you first try Plan B and it fails to help Plan D = Deltasone = prednisone(refillable)  If ABC not working, Prednisone 10 mg take  4 each am x 2 days,   2 each am x 2 days,  1 each am x 2 days and stop  For cough > mucinex 1200 mg every 12 hours as needed  Please schedule a follow up office visit in 6 weeks, call sooner if needed with all medications /inhalers/ solutions in hand        11/04/2022  6 m  f/u ov/[place] office/Tracy Johnston re: GOLD 0/CB maint on trelegy / not following  ABCD instructions  and frustrated she keeps having to take prednisone for flares of cough and congestion / still variably smoking  Chief Complaint  Patient presents with   Follow-up  Dyspnea:  doing treadmill x 20 min / bike x 20 min x 3 per week  Cough: esp in am > clear  Sleeping: flat bed 3 pillows s resp cc  SABA use: used x one  02: 2lpm not  daytime Rec Stop trelegy  Duoneb  up to 4 x daily with budesonide  0.25 with each dose  As needed albuterol up to every 4 hours if you can't catch your breath Only use prednisone if above not working to your satisfaction   Please schedule a follow up office visit in 4 weeks, sooner if needed  with all medications /inhalers/ solutions in hand      12/03/2022  f/u ov/ office/Tracy Johnston re: copd 0/ CB  maint on duoneb/bud   did not bring meds  / confused again with instructions esp action plan  Chief Complaint  Patient presents with   COPD GOLD 0 / CB  Dyspnea:  no longer doing any ex / does [employer]  most the time ok slow pace pushing  the buggy  Cough: min mucoid esp in am  Sleeping: flat bed/ wedge pillow s noct   resp cc  SABA use: just when out may use proair occ  like going to church  02: 2lpm hs and none daytime  Lung cancer screening: 11/12/22    No obvious day  to day or daytime variability or assoc excess/ purulent sputum or mucus plugs or hemoptysis or cp or chest tightness, subjective wheeze or overt sinus or hb symptoms.    Also denies any obvious fluctuation of symptoms with weather or environmental changes or other aggravating or alleviating factors except as outlined above   No unusual exposure hx or h/o childhood pna/ asthma or knowledge of premature birth.  Current Allergies, Complete Past Medical History, Past Surgical History, Family History, and Social History were reviewed in [employer] record.  ROS  The following are not active complaints unless bolded Hoarseness, sore throat, dysphagia, dental problems, itching, sneezing,  nasal congestion or discharge of excess mucus or purulent secretions, ear ache,   fever, chills, sweats, unintended wt loss or wt gain, classically pleuritic or exertional cp,  orthopnea pnd or arm/hand swelling  or leg swelling, presyncope, palpitations, abdominal pain, anorexia, nausea, vomiting, diarrhea  or change  in bowel habits or change in bladder habits, change in stools or change in urine, dysuria, hematuria,  rash, arthralgias, visual complaints, headache, numbness, weakness or ataxia or problems with walking = walks with cane or coordination,  change in mood or  memory.        Current Meds  Medication Sig   albuterol (PROVENTIL) (2.5 MG/3ML) 0.083% nebulizer solution Take 3 mLs (2.5 mg total) by nebulization every 6 (six) hours as needed for wheezing or shortness of breath.   albuterol (VENTOLIN HFA) 108 (90 Base) MCG/ACT inhaler Inhale 2 puffs into the lungs every 6 (six) hours as needed for wheezing or shortness of breath.   allopurinol (ZYLOPRIM) 300 MG tablet TAKE 1 TABLET BY MOUTH DAILY. (Patient taking differently: Take 300 mg by mouth daily.)   budesonide (PULMICORT) 0.25 MG/2ML nebulizer solution Take 2 mLs (0.25 mg total) by nebulization in the morning, at noon, in the evening, and at bedtime.   colchicine 0.6 MG tablet Take 0.6 mg by mouth as needed.   diltiazem (CARDIZEM CD) 240 MG 24 hr capsule Take 1 capsule (240 mg total) by mouth daily.   famotidine (PEPCID) 20 MG tablet One after supper   furosemide (LASIX) 40 MG tablet TAKE 1 TABLET BY MOUTH DAILY   GEMTESA 75 MG TABS Take 1 tablet by mouth daily.   ipratropium-albuterol (DUONEB) 0.5-2.5 (3) MG/3ML SOLN Take 3 mLs by nebulization 4 (four) times daily.   montelukast (SINGULAIR) 10 MG tablet Take 1 tablet (10 mg total) by mouth at bedtime as needed.   olmesartan-hydrochlorothiazide (BENICAR HCT) 40-12.5 MG per tablet Take 1 tablet by mouth daily.     Oxycodone HCl 10 MG TABS Take 1 tablet 4 times a day by oral route as needed.   pantoprazole (PROTONIX) 40 MG tablet TAKE 1 TABLET BY MOUTH ONCE DAILY 30-60 MINUTES BEFORE first meal of THE DAY   polyethylene glycol (MIRALAX / GLYCOLAX) 17 g packet Take 17 g by mouth daily.   predniSONE (DELTASONE) 10 MG tablet Take 10 mg by mouth daily as needed.   rosuvastatin (CRESTOR) 10 MG tablet  TAKE 1 TABLET BY MOUTH DAILY   solifenacin (VESICARE) 5 MG tablet Take 5 mg by mouth daily.   triamcinolone ointment (KENALOG) 0.1 % Apply 1 Application topically 2 (two) times daily.   XARELTO 20 MG TABS tablet TAKE 1 TABLET BY MOUTH DAILY WITH SUPPER   [DISCONTINUED] PROAIR HFA 108 (90 Base) MCG/ACT inhaler Inhale 2 puffs into the lungs every 4 (four) hours as needed for shortness of breath or  wheezing.                      Past Medical History:  Diagnosis Date   Arthritis    Asthma    Atrial fibrillation (HCC)    chronic. Was first diagnosed in her early 24s.   Cancer (HCC) 2003ish   , cervix   COPD (chronic obstructive pulmonary disease) (HCC)    Dysrhythmia    Heart murmur    Hypertension    Insomnia    Malignant neoplasm of kidney (HCC)    rt removed 2003   Mitral regurgitation    Mild to moderate. Normal EF 09/2010   Renal insufficiency    Sickle cell anemia (HCC)    Tobacco abuse        Objective:    Wts  12/03/2022       206  11/04/2022      208   05/05/2022        206   03/27/22 207 lb (93.9 kg)  02/11/22 202 lb 12.8 oz (92 kg)  12/27/21 199 lb (90.3 kg)    Vital signs reviewed  12/03/2022  - Note at rest 02 sats  94% on RA   General appearance:    frail elerly bf nad uses cane      HEENT : Oropharynx  clear  Nasal turbinates nl    NECK :  without  apparent JVD/ palpable Nodes/TM    LUNGS: no acc muscle use,  Min barrel  contour chest wall with distant wheeze better with PLM and  without cough on insp or exp maneuvers and min  Hyperresonant  to  percussion bilaterally    CV:  RRR  no s3 or murmur or increase in P2, and no edema   ABD:  soft and nontender with pos end  insp Hoover's  in the supine position.  No bruits or organomegaly appreciated   MS:  Nl gait/ ext warm without deformities Or obvious joint restrictions  calf tenderness, cyanosis or clubbing     SKIN: warm and dry without lesions    NEURO:  alert, approp, nl sensorium with  no  motor or cerebellar deficits apparent.                I personally reviewed images and agree with radiology impression as follows:   Chest LDSCT     11/12/22     1. Lung-RADS 2, benign appearance or behavior. Continue annual screening with low-dose chest CT without contrast in 12 months. 2. Probable cholelithiasis. 3. Aortic Atherosclerosis (ICD10-I70.0) and Emphysema (ICD10-J43.9). Coronary artery atherosclerosis.  My review  emphysema is moderate     Assessment

## 2022-12-03 NOTE — Patient Instructions (Addendum)
Duoneb  up to 4 x daily with budesonide  0.25 with each dose   As needed albuterol (RED =Proair) up to every 4 hours if you can't catch your breath  Only use prednisone if above not working to your satisfaction= Prednisone 10 mg take  4 each am x 2 days,   2 each am x 2 days,  1 each am x 2 days and stop    Please schedule a follow up office visit in 4 weeks, sooner if needed  with all medications /inhalers/ solutions in hand so we can verify exactly what you are taking. This includes all medications from all doctors and over the counters

## 2022-12-03 NOTE — Assessment & Plan Note (Addendum)
Active smoker with centrilobular emphysema on LDST 08/2021  - 02/11/2022  After extensive coaching inhaler device,  effectiveness =    75% with hfa and dpi  -  03/27/2022   Walked on RA  x  3  lap(s) =  approx 450  ft  @ mod pace, stopped due to end of study with lowest 02 sats 91% and  mild sob p 300 ft but not need to stop -  PFT's  04/08/22  FEV1 1.14 (52 % ) ratio 0.70  p 0 % improvement from saba p 0 prior to study with DLCO  9.87 (51%)   and FV curve min concave and ERV 39% at wt 200   - Allergy screen 11/04/2022 >  Eos 0.1 /  IgE  100 - 11/04/2022 changed to duoneb up to qid with budesonide 0.25 mg in place of trelegy as does not fit criteria for copd and more of an AB pattern  for which dpi may aggravate the cough component   12/03/2022  After extensive coaching inhaler device,  effectiveness =    50% from a baseline near 0  Improved on present rx, no changes needed but does need prednisone on hand for prn use.  F/u in 4 weeks with all meds in hand using a trust but verify approach to confirm accurate Medication  Reconciliation The principal here is that until we are certain that the  patients are doing what we've asked, it makes no sense to ask them to do more.          Each maintenance medication was reviewed in detail including emphasizing most importantly the difference between maintenance and prns and under what circumstances the prns are to be triggered using an action plan format where appropriate.  Total time for H and P, chart review, counseling, reviewing hfa/neb device(s) and generating customized AVS unique to this office visit / same day charting = 30 min

## 2022-12-10 ENCOUNTER — Encounter: Payer: Self-pay | Admitting: Orthopaedic Surgery

## 2022-12-10 ENCOUNTER — Telehealth: Payer: Self-pay | Admitting: Radiology

## 2022-12-10 ENCOUNTER — Other Ambulatory Visit (INDEPENDENT_AMBULATORY_CARE_PROVIDER_SITE_OTHER): Payer: 59

## 2022-12-10 ENCOUNTER — Ambulatory Visit (INDEPENDENT_AMBULATORY_CARE_PROVIDER_SITE_OTHER): Payer: 59 | Admitting: Orthopaedic Surgery

## 2022-12-10 VITALS — Ht 63.0 in | Wt 205.0 lb

## 2022-12-10 DIAGNOSIS — M25511 Pain in right shoulder: Secondary | ICD-10-CM

## 2022-12-10 DIAGNOSIS — M7541 Impingement syndrome of right shoulder: Secondary | ICD-10-CM | POA: Insufficient documentation

## 2022-12-10 NOTE — Telephone Encounter (Signed)
Left voicemail advising  

## 2022-12-10 NOTE — Progress Notes (Signed)
Office Visit Note   Patient: Tracy Johnston           Date of Birth: Apr 04, 1950           MRN: [id number] Visit Date: 12/10/2022              Requested by: Catalina Lunger, DO [address] [place],  [place] [address] PCP: Catalina Lunger, DO   Assessment & Plan: Visit Diagnoses:  1. Acute pain of right shoulder   2. Impingement syndrome of right shoulder     Plan: Subacromial injection performed hopefully this will give her sustained relief.  She needs to continue going to the gym working on left quad strength help with her symptoms of pain.  Return as needed.  Follow-Up Instructions: No follow-ups on file.   Orders:  Orders Placed This Encounter  Procedures   XR Shoulder Right   No orders of the defined types were placed in this encounter.     Procedures: No procedures performed   Clinical Data: No additional findings.   Subjective: Chief Complaint  Patient presents with   Right Shoulder - Pain    HPI 72 year old female post right and left total knee arthroplasties.  Still has some quad weakness she is using a cane in her right hand and has right shoulder pain difficulty getting arm up overhead times several weeks.  She has used Aleve without relief.  She is on oxycodone 10 mg 150 tablets monthly by Dr. Ethelene Hal.  Denies any recent falls.  Review of Systems updated unchanged   Objective: Vital Signs: Ht 5\' 3"  (1.6 m)   Wt 205 lb (93 kg)   BMI 36.31 kg/m   Physical Exam Constitutional:      Appearance: She is well-developed.  HENT:     Head: Normocephalic.     Right Ear: External ear normal.     Left Ear: External ear normal. There is no impacted cerumen.  Eyes:     Pupils: Pupils are equal, round, and reactive to light.  Neck:     Thyroid: No thyromegaly.     Trachea: No tracheal deviation.  Cardiovascular:     Rate and Rhythm: Normal rate.  Pulmonary:     Effort: Pulmonary effort is normal.  Abdominal:     Palpations: Abdomen is soft.  Musculoskeletal:      Cervical back: No rigidity.  Skin:    General: Skin is warm and dry.  Neurological:     Mental Status: She is alert and oriented to person, place, and time.  Psychiatric:        Behavior: Behavior normal.     Ortho Exam positive impingement right shoulder good cervical range of motion.  Minimal brachioplexus tenderness on the right.  Long head of the biceps minimally tender.  Subscap is strong negative lift off test.  Specialty Comments:  No specialty comments available.  Imaging: XR Shoulder Right  Result Date: 12/10/2022 Three-view x-rays right shoulder obtained and reviewed.  This shows tiny osteophytes of the shoulder.  No high riding head no soft tissue calcification.  Portion of the lung field is clear.  Acromioclavicular joint is normal. Impression: Mild right shoulder OA changes.    PMFS History: Patient Active Problem List   Diagnosis Date Noted   Impingement syndrome of right shoulder 12/10/2022   Calculus of gallbladder without cholecystitis without obstruction 11/27/2022   Cigarette smoker 05/05/2022   Nocturnal hypoxemia 02/11/2022   CAD (coronary artery disease) 09/06/2021   Centrilobular emphysema (  HCC) 09/06/2021   Quadriceps weakness 10/18/2020   RUQ pain    Wheezing    S/P total knee arthroplasty, 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   Ventral hernia without obstruction or gangrene 04/18/2020   BMI 33.0-33.9,adult 08/12/2018   HLD (hyperlipidemia) 08/12/2018   LLQ abdominal pain 08/12/2018   Mixed stress and urge urinary incontinence 08/12/2018   History of bilateral hip arthroplasty 07/09/2017   Long-term current use of opiate analgesic 02/18/2017   On long term drug therapy 02/06/2017   Chronic low back pain 02/06/2017   Chronic pain syndrome 02/06/2017   Pain in left knee 11/22/2015   Incisional hernia, without obstruction or gangrene 09/09/2012   Acute blood loss anemia 10/20/2011    Class: Acute   COPD GOLD 0 /  CB 03/03/2011   Dyspnea 12/02/2010   Preop  cardiovascular exam 10/31/2010   Claudication (HCC) 10/18/2010   Atrial fibrillation (HCC)    Tobacco abuse    Hypertension    Past Medical History:  Diagnosis Date   Arthritis    Asthma    Atrial fibrillation (HCC)    chronic. Was first diagnosed in her early 37s.   CAD (coronary artery disease) 09/06/2021   Cancer (HCC) 2003ish   , cervix   COPD (chronic obstructive pulmonary disease) (HCC)    Dysrhythmia    Heart murmur    Hypertension    Insomnia    Malignant neoplasm of kidney (HCC)    rt removed 2003   Mitral regurgitation    Mild to moderate. Normal EF 09/2010   Renal insufficiency    Sickle cell anemia (HCC)    Tobacco abuse     Family History  Problem Relation Age of Onset   Diabetes Mother    Cancer Mother        ovarian    Past Surgical History:  Procedure Laterality Date   ABDOMINAL HYSTERECTOMY     arm fracture     rods, pins and bone graft.- to left arm from hip   BONE GRAFT HIP ILIAC CREST     to left arm   CERVICAL CONIZATION W/BX     NEPHRECTOMY  2003   TONSILLECTOMY     TOTAL HIP ARTHROPLASTY  10/17/2011   Procedure: TOTAL HIP ARTHROPLASTY;  Surgeon: Eldred Manges, MD;  Location: [facility];  Service: Orthopedics;  Laterality: Left;  Left Total Hip Arthroplasty   TOTAL HIP ARTHROPLASTY  01/02/2012   Procedure: TOTAL HIP ARTHROPLASTY;  Surgeon: Eldred Manges, MD;  Location: [facility] OR;  Service: Orthopedics;  Laterality: Right;  Right Total Hip Arthroplasty   TOTAL KNEE ARTHROPLASTY Left 06/15/2020   Procedure: LEFT TOTAL KNEE ARTHROPLASTY;  Surgeon: Eldred Manges, MD;  Location: [facility];  Service: Orthopedics;  Laterality: Left;  Needs RNFA   TUBAL LIGATION     Social History   Occupational History   Not on file  Tobacco Use   Smoking status: Former    Current packs/day: 0.00    Average packs/day: 1 pack/day for 40.0 years (40.0 ttl pk-yrs)    Types: Cigarettes    Start date: 10/21/1983    Quit date: 10/20/2017    Years since quitting: 5.1   Smokeless  tobacco: Never   Tobacco comments:    just quit since getting out of hospital 9/24 restarted smoking 9 months ago-AJ  Vaping Use   Vaping status: Never Used  Substance and Sexual Activity   Alcohol use: Yes    Alcohol/week: 0.0 standard drinks of alcohol  Comment: occasions only 1 glass of wine   Drug use: No   Sexual activity: Not Currently

## 2022-12-10 NOTE — Telephone Encounter (Signed)
Katrina, the nurse who is helping to take care of Tracy Johnston asks that you fax a prescription for a heating pad to [phone] for patient. She states if we send the rx in, they will pay for it so that patient can have it.  Please advise.  OK for rx?

## 2022-12-28 NOTE — Progress Notes (Unsigned)
Cardiology Office Note   Date:  12/29/2022   ID:  Tracy Johnston, DOB 02/22/1950, MRN [id number]  PCP:  Catalina Lunger, DO  Cardiologist:   Dietrich Pates, MD   Pt returns for follow up of HTN atrial fibrillation       History of Present Illness: Tracy Johnston is a 72 y.o. female with a history of permanent atrial fib, HTN, HFpEF , mitral regurgitation, aortic regurg  and COPD  ( on home O2) Myoview in 2022 No ischemia   LVEF normal    I saw the pt in clinic in Dec 2023    Echo Dec 2023 showed LVEF normal  mildly reduced RVEF   Moderate AI, mild MR    She was seen by Shawnie Dapper in the interval in June 2024  LE dopplers (for leg pain) were normal    The pt says overall she feels OK  Did not use breathing treatment this morning   Too early for appt    Otherwise she denies CP  No palpitations No dizziness      Current Meds  Medication Sig   albuterol (PROAIR HFA) 108 (90 Base) MCG/ACT inhaler 2 puffs every 4 hours as needed only  if your can't catch your breath   albuterol (PROVENTIL) (2.5 MG/3ML) 0.083% nebulizer solution Take 3 mLs (2.5 mg total) by nebulization every 6 (six) hours as needed for wheezing or shortness of breath.   allopurinol (ZYLOPRIM) 300 MG tablet TAKE 1 TABLET BY MOUTH DAILY. (Patient taking differently: Take 300 mg by mouth daily.)   budesonide (PULMICORT) 0.25 MG/2ML nebulizer solution Take 2 mLs (0.25 mg total) by nebulization in the morning, at noon, in the evening, and at bedtime.   colchicine 0.6 MG tablet Take 0.6 mg by mouth as needed.   diltiazem (CARDIZEM CD) 240 MG 24 hr capsule Take 1 capsule (240 mg total) by mouth daily.   famotidine (PEPCID) 20 MG tablet One after supper   furosemide (LASIX) 40 MG tablet TAKE 1 TABLET BY MOUTH DAILY   ipratropium-albuterol (DUONEB) 0.5-2.5 (3) MG/3ML SOLN Take 3 mLs by nebulization 4 (four) times daily.   montelukast (SINGULAIR) 10 MG tablet Take 1 tablet (10 mg total) by mouth at bedtime as needed.    olmesartan-hydrochlorothiazide (BENICAR HCT) 40-12.5 MG per tablet Take 1 tablet by mouth daily.     Oxycodone HCl 10 MG TABS Take 1 tablet 4 times a day by oral route as needed.   pantoprazole (PROTONIX) 40 MG tablet TAKE 1 TABLET BY MOUTH ONCE DAILY 30-60 MINUTES BEFORE first meal of THE DAY   predniSONE (DELTASONE) 10 MG tablet take  4 each am x 2 days,   2 each am x 2 days,  1 each am x 2 days and stop   rosuvastatin (CRESTOR) 10 MG tablet TAKE 1 TABLET BY MOUTH DAILY   solifenacin (VESICARE) 5 MG tablet Take 5 mg by mouth daily.   triamcinolone ointment (KENALOG) 0.1 % Apply 1 Application topically 2 (two) times daily.   XARELTO 20 MG TABS tablet TAKE 1 TABLET BY MOUTH DAILY WITH SUPPER     Allergies:   Bupropion, Aspirin, Atorvastatin, and Latex   Past Medical History:  Diagnosis Date   Arthritis    Asthma    Atrial fibrillation (HCC)    chronic. Was first diagnosed in her early 82s.   CAD (coronary artery disease) 09/06/2021   Cancer (HCC) 2003ish   , cervix   COPD (chronic obstructive pulmonary disease) (HCC)  Dysrhythmia    Heart murmur    Hypertension    Insomnia    Malignant neoplasm of kidney (HCC)    rt removed 2003   Mitral regurgitation    Mild to moderate. Normal EF 09/2010   Renal insufficiency    Sickle cell anemia (HCC)    Tobacco abuse     Past Surgical History:  Procedure Laterality Date   ABDOMINAL HYSTERECTOMY     arm fracture     rods, pins and bone graft.- to left arm from hip   BONE GRAFT HIP ILIAC CREST     to left arm   CERVICAL CONIZATION W/BX     NEPHRECTOMY  2003   TONSILLECTOMY     TOTAL HIP ARTHROPLASTY  10/17/2011   Procedure: TOTAL HIP ARTHROPLASTY;  Surgeon: Eldred Manges, MD;  Location: [facility];  Service: Orthopedics;  Laterality: Left;  Left Total Hip Arthroplasty   TOTAL HIP ARTHROPLASTY  01/02/2012   Procedure: TOTAL HIP ARTHROPLASTY;  Surgeon: Eldred Manges, MD;  Location: [facility];  Service: Orthopedics;  Laterality: Right;  Right  Total Hip Arthroplasty   TOTAL KNEE ARTHROPLASTY Left 06/15/2020   Procedure: LEFT TOTAL KNEE ARTHROPLASTY;  Surgeon: Eldred Manges, MD;  Location: [facility];  Service: Orthopedics;  Laterality: Left;  Needs RNFA   TUBAL LIGATION       Social History:  The patient  reports that she quit smoking about 5 years ago. Her smoking use included cigarettes. She started smoking about 39 years ago. She has a 40 pack-year smoking history. She has never used smokeless tobacco. She reports that she does not currently use alcohol. She reports that she does not use drugs.   Family History:  The patient's family history includes Cancer in her mother; Diabetes in her mother.    ROS:  Please see the history of present illness. All other systems are reviewed and  Negative to the above problem except as noted.    PHYSICAL EXAM: VS:  BP 136/80   Pulse (!) 55   Ht 5\' 3"  (1.6 m)   Wt 208 lb 12.8 oz (94.7 kg)   SpO2 97%   BMI 36.99 kg/m    GEN:  Obese 72 yo in no acute distress  HEENT: normal  Neck: JVP is normal    Cardiac: Irreg irreg  Normal S1, S2  No definite murmur  No LE edema     Respiratory:  Diffuse wheezes    GI: soft, nontender,   EKG:  EKG is ordered today.  Atrial fibrillation 73 bpm     Echo 12/2021:  1. Left ventricular ejection fraction, by estimation, is 55 to 60%. The  left ventricle has normal function. The left ventricle has no regional  wall motion abnormalities. Left ventricular diastolic function could not  be evaluated. The average left  ventricular global longitudinal strain is -16.7 %. The global longitudinal  strain is abnormal.   2. Right ventricular systolic function is mildly reduced. The right  ventricular size is normal.   3. Left atrial size was severely dilated.   4. Right atrial size was mild to moderately dilated.   5. A small pericardial effusion is present. The pericardial effusion is  circumferential.   6. The mitral valve is grossly normal. Mild mitral valve  regurgitation.  No evidence of mitral stenosis.   7. The aortic valve was not well visualized. Aortic valve regurgitation  is moderate. No aortic stenosis is present.   8. The  inferior vena cava is normal in size with greater than 50%  respiratory variability, suggesting right atrial pressure of 3 mmHg.   Comparison(s): Prior images reviewed side by side. Changes from prior  study are noted. Stable LVEF and stable moderate AR.   Myoview 04/2020: There was no ST segment deviation noted during stress. The study is normal. There are no perfusion defects consistent with prior infarct or current ischemia. This is a low risk study. The left ventricular ejection fraction is normal (55-65%).    Echocardiogram 12/13/2020  1. Left ventricular ejection fraction, by estimation, is 60 to 65%. The  left ventricle has normal function. The left ventricle has no regional  wall motion abnormalities. There is moderate left ventricular hypertrophy.  Left ventricular diastolic  parameters are indeterminate.   2. Right ventricular systolic function is normal. The right ventricular  size is normal. There is normal pulmonary artery systolic pressure.   3. Left atrial size was severely dilated.   4. Atrial septum bows to the right suggesting elevated LA pressure.   5. A small pericardial effusion is present. The pericardial effusion is  circumferential.   6. The mitral valve is abnormal. Mild mitral valve regurgitation. No  evidence of mitral stenosis.   7. The aortic valve is tricuspid. Aortic valve regurgitation is moderate.  No aortic stenosis is present.   8. The inferior vena cava is normal in size with greater than 50%  respiratory variability, suggesting right atrial pressure of 3 mmHg.        Lipid Panel No results found for: "CHOL", "TRIG", "HDL", "CHOLHDL", "VLDL", "LDLCALC", "LDLDIRECT"    Wt Readings from Last 3 Encounters:  12/29/22 208 lb 12.8 oz (94.7 kg)  12/10/22 205 lb (93 kg)   12/03/22 206 lb (93.4 kg)      ASSESSMENT AND PLAN:  1. HFrEF    Volume status OK   Follow   2  Atrial fibrillation  Continue anticoagulation and rate control  3  HTN   BP is fair   Follow      4  HL  LDL 104  HDL 57    Will increase Crestor to 40 mg  Follow up lipids later this winter   3  COPD    PT with diffuse wheezes   Does not sound good    Will start steroids   Continue inhalers   She has not been seen in pulmonary   Will refer  4  HTN  BP is controlled on current regimen     5  Hx mod AI     Echo in Dec 2023    Will follow up periodically       6. Mitral valve  Mild MR   Follow      Current medicines are reviewed at length with the patient today.  The patient does not have concerns regarding medicines.  Signed, Dietrich Pates, MD  12/29/2022 9:36 AM    [facility] [address] [place], [place], [place]  [address] Phone: [phone]; Fax: [phone]

## 2022-12-29 ENCOUNTER — Encounter: Payer: Self-pay | Admitting: Internal Medicine

## 2022-12-29 ENCOUNTER — Ambulatory Visit: Payer: 59 | Attending: Internal Medicine | Admitting: Internal Medicine

## 2022-12-29 VITALS — BP 136/80 | HR 55 | Ht 63.0 in | Wt 208.8 lb

## 2022-12-29 DIAGNOSIS — I4821 Permanent atrial fibrillation: Secondary | ICD-10-CM | POA: Diagnosis not present

## 2022-12-29 DIAGNOSIS — E782 Mixed hyperlipidemia: Secondary | ICD-10-CM

## 2022-12-29 MED ORDER — ROSUVASTATIN CALCIUM 40 MG PO TABS: 40.0000 mg | ORAL_TABLET | Freq: Every day | ORAL | 3 refills | Status: DC

## 2022-12-29 NOTE — Patient Instructions (Signed)
Medication Instructions:  Your physician has recommended you make the following change in your medication:   Increase Crestor to 40 mg Daily at supper   *If you need a refill on your cardiac medications before your next appointment, please call your pharmacy*   Lab Work: Your physician recommends that you return for lab work in: 8 Weeks ( February )   If you have labs (blood work) drawn today and your tests are completely normal, you will receive your results only by: MyChart Message (if you have MyChart) OR A paper copy in the mail If you have any lab test that is abnormal or we need to change your treatment, we will call you to review the results.   Testing/Procedures: NONE    Follow-Up: At [facility], you and your health needs are our priority.  As part of our continuing mission to provide you with exceptional heart care, we have created designated Provider Care Teams.  These Care Teams include your primary Cardiologist (physician) and Advanced Practice Providers (APPs -  Physician Assistants and Nurse Practitioners) who all work together to provide you with the care you need, when you need it.  We recommend signing up for the patient portal called "MyChart".  Sign up information is provided on this After Visit Summary.  MyChart is used to connect with patients for Virtual Visits (Telemedicine).  Patients are able to view lab/test results, encounter notes, upcoming appointments, etc.  Non-urgent messages can be sent to your provider as well.   To learn more about what you can do with MyChart, go to [email or web address].    Your next appointment:    May   Provider:   You may see Dietrich Pates, MD or one of the following Advanced Practice Providers on your designated Care Team:   Randall An, PA-C  Jacolyn Reedy, PA-C     Other Instructions Thank you for choosing [facility]!

## 2022-12-30 NOTE — Progress Notes (Unsigned)
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/ uses [facility] parking  Cough: worse 1st thing in am / better p prednisone  Sleep: bed is flat/ 3 pillows under head s resp rx  SABA use: just trelegy / once neb qoweek 02: 2lpm / has meter says over 90s Lung cancer screen: per Jonita Albee Rec Plan A = Automatic = Always=    Trelegy 100 take one click 1st in am and 2 two breaths - tug hard to get it going Work on inhaler technique:  Plan B = Backup (to supplement plan A, not to replace it) Only use your albuterol inhaler as a rescue medication Plan C = Crisis (instead of Plan B but only if Plan B stops working) - only use your albuterol nebulizer if you first try Plan B and it fails to help Plan D = Deltasone = prednisone(refillable)  If ABC not working, Prednisone 10 mg take  4 each am x 2 days,   2 each am x 2 days,  1 each am x 2 days and stop  For cough > mucinex 1200 mg every 12 hours as needed  Please schedule a follow up office visit in 6 weeks, call sooner if needed with all medications /inhalers/ solutions in hand         12/03/2022  f/u ov/[place] office/Tracy Johnston re: copd 0/ CB  maint on duoneb/bud   did not bring meds  / confused again with instructions esp action plan  Chief Complaint  Patient presents with   COPD GOLD 0 / CB  Dyspnea:  no longer doing any ex / does [employer]  most the time ok slow pace pushing  the buggy  Cough: min mucoid esp in am  Sleeping: flat bed/ wedge pillow s noct   resp cc  SABA use: just when out may use proair occ   like going to church  02: 2lpm hs and none daytime Lung cancer screening: 11/12/22  Rec Duoneb  up to 4 x daily with budesonide  0.25 with each dose  As needed albuterol (RED =Proair) up to every 4 hours if you can't catch your breath Only use prednisone if above not working to your satisfaction= Prednisone 10 mg take  4 each am x 2 days,   2 each am x 2 days,  1 each am x 2 days and stop  Please schedule a follow up office visit in 4 weeks, sooner if needed  with all medications /inhalers/ solutions in hand    12/31/2022  f/u ov/[place] office/Tracy Johnston re: COPD GOLD 0/AB  maint on nebs/budesonide (but no on any duoneb as rec above)   did  bring meds  Chief Complaint  Patient presents with   COPD   Shortness of Breath   Dyspnea:  walks treadmill x 20 min slow pace flat  Cough: worse in am ? Mucoid  Sleeping: flat bed/ wedge pillow s  resp cc  SABA  use: tid neb /rarely needing hfa  02: 2lpm hs  LCS :  Oct 2024     No obvious day to day or daytime variability or assoc purulent sputum or mucus plugs or hemoptysis or cp or chest tightness, subjective wheeze or overt sinus or hb symptoms.    Also denies any obvious fluctuation of symptoms with weather or environmental changes or other aggravating or alleviating factors except as outlined above   No unusual exposure hx or h/o childhood pna/ asthma or knowledge of premature birth.  Current Allergies, Complete Past Medical History, Past Surgical History, Family History, and Social History were reviewed in [employer] record.  ROS  The following are not active complaints unless bolded Hoarseness, sore throat, dysphagia, dental problems, itching, sneezing,  nasal congestion or discharge of excess mucus or purulent secretions, ear ache,   fever, chills, sweats, unintended wt loss or wt gain, classically pleuritic or exertional cp,  orthopnea pnd or arm/hand swelling  or leg swelling, presyncope, palpitations, abdominal  pain, anorexia, nausea, vomiting, diarrhea  or change in bowel habits or change in bladder habits, change in stools or change in urine, dysuria, hematuria,  rash, arthralgias, visual complaints, headache, numbness, weakness or ataxia or problems with walking or coordination,  change in mood or  memory.        Current Meds - list is not correct  - see meds below   Medication Sig   budesonide (PULMICORT) 0.5 MG/2ML nebulizer solution Take 2 mLs (0.5 mg total) by nebulization 2 (two) times daily.   predniSONE (DELTASONE) 10 MG tablet Take 10 mg by mouth daily with breakfast.   solifenacin (VESICARE) 10 MG tablet Take 10 mg by mouth daily.   [DISCONTINUED] TRELEGY ELLIPTA 100-62.5-25 MCG/ACT AEPB INHALE 1 PUFF EVERY DAY                    Past Medical History:  Diagnosis Date   Arthritis    Asthma    Atrial fibrillation (HCC)    chronic. Was first diagnosed in her early 9s.   Cancer (HCC) 2003ish   , cervix   COPD (chronic obstructive pulmonary disease) (HCC)    Dysrhythmia    Heart murmur    Hypertension    Insomnia    Malignant neoplasm of kidney (HCC)    rt removed 2003   Mitral regurgitation    Mild to moderate. Normal EF 09/2010   Renal insufficiency    Sickle cell anemia (HCC)    Tobacco abuse        Objective:    Wts  12/31/2022      208  12/03/2022       206  11/04/2022      208   05/05/2022        206   03/27/22 207 lb (93.9 kg)  02/11/22 202 lb 12.8 oz (92 kg)  12/27/21 199 lb (90.3 kg)     Vital signs reviewed  12/31/2022  - Note at rest 02 sats  95% on RA   General appearance:    amb bf mild smoker's rattling    HEENT : Oropharynx  clear  Nasal turbinates nl    NECK :  without  apparent JVD/ palpable Nodes/TM    LUNGS: no acc muscle use,  Min barrel  contour chest wall with bilateral  slightly decreased bs s audible wheeze and  without cough on insp or exp maneuvers and min  Hyperresonant  to  percussion  bilaterally    CV:  RRR  no s3 or murmur or  increase in P2, and no edema   ABD:  soft and nontender with pos end  insp Hoover's  in the supine position.  No bruits or organomegaly appreciated   MS:  Nl gait/ ext warm without deformities Or obvious joint restrictions  calf tenderness, cyanosis or clubbing     SKIN: warm and dry without lesions    NEURO:  alert, approp, nl sensorium with  no motor or cerebellar deficits apparent.                  I personally reviewed images and agree with radiology impression as follows:   Chest LDSCT     11/12/22     1. Lung-RADS 2, benign appearance or behavior. Continue annual screening with low-dose chest CT without contrast in 12 months. 2. Probable cholelithiasis. 3. Aortic Atherosclerosis (ICD10-I70.0) and Emphysema (ICD10-J43.9). Coronary artery atherosclerosis.  My review  emphysema is moderate     Assessment   Outpatient Encounter Medications as of 12/31/2022  Medication Sig   budesonide (PULMICORT) 0.5 MG/2ML nebulizer solution Take 2 mLs (0.5 mg total) by nebulization 2 (two) times daily.   predniSONE (DELTASONE) 10 MG tablet Take 10 mg by mouth daily with breakfast.   solifenacin (VESICARE) 10 MG tablet Take 10 mg by mouth daily.   [DISCONTINUED] TRELEGY ELLIPTA 100-62.5-25 MCG/ACT AEPB INHALE 1 PUFF EVERY DAY   albuterol (PROAIR HFA) 108 (90 Base) MCG/ACT inhaler 2 puffs every 4 hours as needed only  if your can't catch your breath   albuterol (PROVENTIL) (2.5 MG/3ML) 0.083% nebulizer solution Take 3 mLs (2.5 mg total) by nebulization every 6 (six) hours as needed for wheezing or shortness of breath.   allopurinol (ZYLOPRIM) 300 MG tablet TAKE 1 TABLET BY MOUTH DAILY. (Patient taking differently: Take 300 mg by mouth daily.)   colchicine 0.6 MG tablet Take 0.6 mg by mouth as needed.   diltiazem (CARDIZEM CD) 240 MG 24 hr capsule Take 1 capsule (240 mg total) by mouth daily.   famotidine (PEPCID) 20 MG tablet One after supper   furosemide (LASIX) 40 MG tablet TAKE 1 TABLET BY  MOUTH DAILY   GEMTESA 75 MG TABS Take 1 tablet by mouth daily. (Patient not taking: Reported on 12/29/2022)   ipratropium-albuterol (DUONEB) 0.5-2.5 (3) MG/3ML SOLN Take 3 mLs by nebulization 2 (two) times daily.   olmesartan-hydrochlorothiazide (BENICAR HCT) 40-12.5 MG per tablet Take 1 tablet by mouth daily.     Oxycodone HCl 10 MG TABS Take 1 tablet 4 times a day by oral route as needed.   pantoprazole (PROTONIX) 40 MG tablet TAKE 1 TABLET BY MOUTH ONCE DAILY 30-60 MINUTES BEFORE first meal of THE DAY   polyethylene glycol (MIRALAX / GLYCOLAX) 17 g packet Take 17 g by mouth daily. (Patient not taking: Reported on 12/29/2022)   rosuvastatin (CRESTOR) 40 MG tablet Take 1 tablet (40 mg total) by mouth daily.   triamcinolone ointment (KENALOG) 0.1 % Apply 1 Application topically 2 (two) times daily.   XARELTO 20 MG TABS tablet TAKE 1 TABLET BY MOUTH DAILY WITH SUPPER   [DISCONTINUED] budesonide (PULMICORT) 0.25 MG/2ML nebulizer solution Take 2 mLs (0.25 mg total) by nebulization in the morning, at noon, in the evening, and at bedtime.   [DISCONTINUED] ipratropium-albuterol (DUONEB) 0.5-2.5 (3) MG/3ML SOLN Take 3 mLs by nebulization 4 (four) times daily.   [DISCONTINUED] montelukast (SINGULAIR) 10 MG tablet Take 1 tablet (10 mg  total) by mouth at bedtime as needed.   [DISCONTINUED] predniSONE (DELTASONE) 10 MG tablet take  4 each am x 2 days,   2 each am x 2 days,  1 each am x 2 days and stop   [DISCONTINUED] solifenacin (VESICARE) 5 MG tablet Take 5 mg by mouth daily.   No facility-administered encounter medications on file as of 12/31/2022.

## 2022-12-31 ENCOUNTER — Ambulatory Visit: Payer: 59 | Admitting: Internal Medicine

## 2022-12-31 ENCOUNTER — Encounter: Payer: Self-pay | Admitting: Internal Medicine

## 2022-12-31 VITALS — BP 156/88 | HR 70 | Ht 63.0 in | Wt 208.0 lb

## 2022-12-31 DIAGNOSIS — F17210 Nicotine dependence, cigarettes, uncomplicated: Secondary | ICD-10-CM | POA: Diagnosis not present

## 2022-12-31 DIAGNOSIS — J449 Chronic obstructive pulmonary disease, unspecified: Secondary | ICD-10-CM | POA: Diagnosis not present

## 2022-12-31 MED ORDER — BUDESONIDE 0.5 MG/2ML IN SUSP: 0.5000 mg | Freq: Two times a day (BID) | RESPIRATORY_TRACT | 12 refills | Status: DC

## 2022-12-31 MED ORDER — IPRATROPIUM-ALBUTEROL 0.5-2.5 (3) MG/3ML IN SOLN: 3.0000 mL | Freq: Two times a day (BID) | RESPIRATORY_TRACT | 11 refills | Status: AC

## 2022-12-31 NOTE — Assessment & Plan Note (Signed)

## 2022-12-31 NOTE — Assessment & Plan Note (Addendum)
Active smoker with moderate centrilobular emphysema on LDST 08/2021  - 02/11/2022  After extensive coaching inhaler device,  effectiveness =    75% with hfa and dpi  -  03/27/2022   Walked on RA  x  3  lap(s) =  approx 450  ft  @ mod pace, stopped due to end of study with lowest 02 sats 91% and  mild sob p 300 ft but not need to stop -  PFT's  04/08/22  FEV1 1.14 (52 % ) ratio 0.70  p 0 % improvement from saba p 0 prior to study with DLCO  9.87 (51%)   and FV curve min concave and ERV 39% at wt 200   - Allergy screen 11/04/2022 >  Eos 0.1 /  IgE  100 - 11/04/2022 changed to duoneb up to qid with budesonide 0.25 mg in place of trelegy as does not fit criteria for copd and more of an AB pattern  for which dpi may aggravate the cough component  - 12/31/2022  After extensive coaching inhaler device,  effectiveness =    75% from baseline 25% (delayed trigger, short Ti)  She is technically copd god 0 with AB so should not be steroid or lama/laba dep and would not use these in DPI form when cough is a prominent component of her problem.  She cannot afford the neb form either so will try a simple rx:  Duoneb with bud 0.5 mg bid Albuterol either hfa or neb q 4h prn with goal of eliminating need for pred and minimizing need for saba as well   F/u in 6 weeks with all resp meds in hand using a trust but verify approach to confirm accurate Medication  Reconciliation The principal here is that until we are certain that the  patients are doing what we've asked, it makes no sense to ask them to do more.          Each maintenance medication was reviewed in detail including emphasizing most importantly the difference between maintenance and prns and under what circumstances the prns are to be triggered using an action plan format where appropriate.  Total time for H and P, chart review, counseling, reviewing hfa/neb device(s) and generating customized AVS unique to this office visit / same day charting  > 30 min for   refractory respiratory  symptoms of uncertain etiology

## 2022-12-31 NOTE — Patient Instructions (Addendum)
Change  budesonide to 0.5 mg twice daily with duoneb (combination of albuterol and ipatropium)   If needed to catch your breath  in between the twice daily duoneb, then use albuterol either in the inhaler (PROAIR)  or nebulizer up to every 4 hours AS NEEDED   Work on inhaler technique:  relax and gently blow all the way out then take a nice smooth full deep breath back in, triggering the inhaler at same time you start breathing in.  Hold breath in for at least  5 seconds if you can.    Please schedule a follow up office visit in 6 weeks, call sooner if needed with all  respiratory medications /inhalers/ solutions in hand so we can verify exactly what you are taking. This includes all medications from all doctors and over the counters

## 2023-01-01 ENCOUNTER — Ambulatory Visit: Payer: 59 | Admitting: Internal Medicine

## 2023-01-26 ENCOUNTER — Other Ambulatory Visit: Payer: Self-pay | Admitting: Internal Medicine

## 2023-02-11 ENCOUNTER — Ambulatory Visit: Payer: 59 | Admitting: Internal Medicine

## 2023-02-28 ENCOUNTER — Other Ambulatory Visit: Payer: Self-pay | Admitting: Internal Medicine

## 2023-03-01 NOTE — Progress Notes (Signed)
 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  Cough: worse 1st thing in am / better p prednisone   Sleep: bed is flat/ 3 pillows under head s resp rx  SABA use: just trelegy / once neb qoweek 02: 2lpm / has meter says over 90s Lung cancer screen: per [facility] Rec Plan A = Automatic = Always=    Trelegy 100 take one click 1st in am and 2 two breaths - tug hard to get it going Work on inhaler technique:  Plan B = Backup (to supplement plan A, not to replace it) Only use your albuterol  inhaler as a rescue medication Plan C = Crisis (instead of Plan B but only if Plan B stops working) - only use your albuterol  nebulizer if you first try Plan B and it fails to help Plan D = Deltasone  = prednisone (refillable)  If ABC not working, Prednisone  10 mg take  4 each am x 2 days,   2 each am x 2 days,  1 each am x 2 days and stop  For cough > mucinex 1200 mg every 12 hours as needed  Please schedule a follow up office visit in 6 weeks, call sooner if needed with all medications /inhalers/ solutions in hand         12/03/2022  f/u ov/[place] office/Randa Riss re: copd 0/ CB  maint on duoneb/bud   did not bring meds  / confused again with instructions esp action plan  Chief Complaint  Patient presents with   COPD GOLD 0 / CB  Dyspnea:  no longer doing any ex / does [employer]  most the time ok slow pace pushing  the buggy  Cough: min mucoid esp in am  Sleeping: flat bed/ wedge pillow s noct   resp cc  SABA use: just when out may use proair  occ  like  going to church  02: 2lpm hs and none daytime Lung cancer screening: 11/12/22  Rec Duoneb  up to 4 x daily with budesonide   0.25 with each dose  As needed albuterol  (RED =Proair ) up to every 4 hours if you can't catch your breath Only use prednisone  if above not working to your satisfaction= Prednisone  10 mg take  4 each am x 2 days,   2 each am x 2 days,  1 each am x 2 days and stop  Please schedule a follow up office visit in 4 weeks, sooner if needed  with all medications /inhalers/ solutions in hand    12/31/2022  f/u ov/[place] office/Pakou Rainbow re: COPD GOLD 0/AB  maint on nebs/budesonide  (but no on any duoneb as rec above)   did  bring meds  Chief Complaint  Patient presents with   COPD   Shortness of Breath   Dyspnea:  walks treadmill x 20 min slow pace flat  Cough: worse in am ? Mucoid  Sleeping: flat bed/ wedge pillow s  resp cc  SABA use:  tid neb /rarely needing hfa  02: 2lpm hs  LCS :  Oct 2024  Rec Change  budesonide  to 0.5 mg twice daily with duoneb (combination of albuterol  and ipatropium)  If needed to catch your breath  in between the twice daily duoneb, then use albuterol  either in the inhaler (PROAIR )  or nebulizer up to every 4 hours AS NEEDED  Work on inhaler technique:   Please schedule a follow up office visit in 6 weeks, call sooner if needed with all  respiratory medications /inhalers/ solutions in han   03/03/2023  f/u ov/[place] office/Francelia Mclaren re: COPD / GOLD/ CB  maint on duoneb/budesonide  bid  did not  bring all resp meds  Chief Complaint  Patient presents with   Follow-up    Follow up for COPD   Dyspnea:  goes to church / grocery shopping/ planning to start back treadmill but needing scooter for wm Cough: none  Sleeping: flat bed/ wedge pillow s  resp cc  SABA use: rarely = once every 2 weeks 02: 2lpm hs   Lung cancer screening: done  q Oct at [facility] - see below   No obvious day to day or daytime variability or assoc excess/ purulent sputum or mucus  plugs or hemoptysis or cp or chest tightness, subjective wheeze or overt sinus or hb symptoms.    Also denies any obvious fluctuation of symptoms with weather or environmental changes or other aggravating or alleviating factors except as outlined above   No unusual exposure hx or h/o childhood pna/ asthma or knowledge of premature birth.  Current Allergies, Complete Past Medical History, Past Surgical History, Family History, and Social History were reviewed in [employer] record.  ROS  The following are not active complaints unless bolded Hoarseness, sore throat, dysphagia, dental problems, itching, sneezing,  nasal congestion or discharge of excess mucus or purulent secretions, ear ache,   fever, chills, sweats, unintended wt loss or wt gain, classically pleuritic or exertional cp,  orthopnea pnd or arm/hand swelling  or leg swelling, presyncope, palpitations, abdominal pain, anorexia, nausea, vomiting, diarrhea  or change in bowel habits or change in bladder habits, change in stools or change in urine, dysuria, hematuria,  rash, arthralgias, visual complaints, headache, numbness, weakness or ataxia or problems with walking or coordination,  change in mood or  memory.        Current Meds  Medication Sig   albuterol  (PROAIR  HFA) 108 (90 Base) MCG/ACT inhaler 2 puffs every 4 hours as needed only  if your can't catch your breath   albuterol  (PROVENTIL ) (2.5 MG/3ML) 0.083% nebulizer solution Take 3 mLs (2.5 mg total) by nebulization every 6 (six) hours as needed for wheezing or shortness of breath.   allopurinol  (ZYLOPRIM ) 300 MG tablet TAKE 1 TABLET BY MOUTH DAILY. (Patient taking differently: Take 300 mg by mouth daily.)   budesonide  (PULMICORT ) 0.5 MG/2ML nebulizer solution Take 2 mLs (0.5 mg total) by nebulization 2 (two) times daily.   colchicine 0.6 MG tablet Take 0.6 mg by mouth as needed.   diltiazem  (CARDIZEM  CD) 240 MG 24 hr capsule Take 1 capsule (240 mg total) by  mouth daily.   famotidine  (PEPCID ) 20 MG tablet TAKE 1 TABLET BY MOUTH DAILY AFTER SUPPER   furosemide  (LASIX ) 40 MG tablet TAKE 1 TABLET BY MOUTH DAILY   ipratropium-albuterol  (DUONEB) 0.5-2.5 (3) MG/3ML SOLN Take 3 mLs by nebulization 2 (two) times daily.   olmesartan -hydrochlorothiazide  (BENICAR  HCT) 40-12.5 MG per tablet Take 1 tablet by  mouth daily.     Oxycodone  HCl 10 MG TABS Take 1 tablet 4 times a day by oral route as needed.   pantoprazole  (PROTONIX ) 40 MG tablet TAKE 1 TABLET BY MOUTH ONCE DAILY 30-60 MINUTES BEFORE first meal of THE DAY   polyethylene glycol (MIRALAX  / GLYCOLAX ) 17 g packet Take 17 g by mouth daily.   predniSONE  (DELTASONE ) 10 MG tablet No longer taker    rosuvastatin  (CRESTOR ) 40 MG tablet Take 1 tablet (40 mg total) by mouth daily.   solifenacin (VESICARE) 10 MG tablet Take 10 mg by mouth daily.   triamcinolone ointment (KENALOG) 0.1 % Apply 1 Application topically 2 (two) times daily.   XARELTO  20 MG TABS tablet TAKE 1 TABLET BY MOUTH DAILY WITH SUPPER                       Past Medical History:  Diagnosis Date   Arthritis    Asthma    Atrial fibrillation (HCC)    chronic. Was first diagnosed in her early 32s.   Cancer (HCC) 2003ish   , cervix   COPD (chronic obstructive pulmonary disease) (HCC)    Dysrhythmia    Heart murmur    Hypertension    Insomnia    Malignant neoplasm of kidney (HCC)    rt removed 2003   Mitral regurgitation    Mild to moderate. Normal EF 09/2010   Renal insufficiency    Sickle cell anemia (HCC)    Tobacco abuse        Objective:    Wts  03/03/2023        201  12/31/2022      208  12/03/2022       206  11/04/2022      208   05/05/2022        206   03/27/22 207 lb (93.9 kg)  02/11/22 202 lb 12.8 oz (92 kg)  12/27/21 199 lb (90.3 kg)   Vital signs reviewed  03/03/2023  - Note at rest 02 sats  95% on RA    General appearance:    pleasant slt hoarse bf nad /walks with cane        HEENT : Oropharynx  clear       NECK :  without  apparent JVD/ palpable Nodes/TM    LUNGS: no acc muscle use,  Min barrel  contour chest wall with bilateral  slightly decreased bs s audible wheeze and  without cough on insp or exp maneuvers and min  Hyperresonant  to  percussion bilaterally    CV:  RRR  no s3 or murmur or increase in P2, and no edema   ABD:  obese soft and nontender   MS:  walks slowly with cane/ ext warm without deformities Or obvious joint restrictions  calf tenderness, cyanosis or clubbing     SKIN: warm and dry without lesions    NEURO:  alert, approp, nl sensorium with  no motor or cerebellar deficits apparent.              I personally reviewed images and agree with radiology impression as follows:   Chest LDSCT     11/12/22     1. Lung-RADS 2, benign appearance or behavior. Continue annual screening with low-dose chest CT without contrast in 12 months. 2. Probable cholelithiasis. 3. Aortic Atherosclerosis (ICD10-I70.0) and Emphysema (ICD10-J43.9). Coronary artery atherosclerosis.  My review  emphysema is moderate     Assessment

## 2023-03-03 ENCOUNTER — Ambulatory Visit: Payer: 59 | Admitting: Internal Medicine

## 2023-03-03 ENCOUNTER — Encounter: Payer: Self-pay | Admitting: Internal Medicine

## 2023-03-03 VITALS — BP 127/88 | HR 85 | Ht 63.0 in | Wt 201.6 lb

## 2023-03-03 DIAGNOSIS — G4734 Idiopathic sleep related nonobstructive alveolar hypoventilation: Secondary | ICD-10-CM

## 2023-03-03 DIAGNOSIS — J449 Chronic obstructive pulmonary disease, unspecified: Secondary | ICD-10-CM | POA: Diagnosis not present

## 2023-03-03 NOTE — Assessment & Plan Note (Signed)
 On 2lpm HS at 1st pulmonary ov 02/11/2022  --  03/27/2022   Walked on RA  x  3  lap(s) =  approx 450  ft  @ mod pace, stopped due to end of study with lowest 02 sats 91% and  mild sob p 300 ft but not need to stop  Continue hs 02 at 2lpm   F/u q 6 m, sooner prn          Each maintenance medication was reviewed in detail including emphasizing most importantly the difference between maintenance and prns and under what circumstances the prns are to be triggered using an action plan format where appropriate.  Total time for H and P, chart review, counseling, reviewing hfa/ neb device(s) and generating customized AVS unique to this office visit / same day charting = 25 min

## 2023-03-03 NOTE — Assessment & Plan Note (Signed)
 Active but minimal  smoker with centrilobular emphysema on LDST 08/2021  - 02/11/2022  After extensive coaching inhaler device,  effectiveness =    75% with hfa and dpi  -  03/27/2022   Walked on RA  x  3  lap(s) =  approx 450  ft  @ mod pace, stopped due to end of study with lowest 02 sats 91% and  mild sob p 300 ft but not need to stop -  PFT's  04/08/22  FEV1 1.14 (52 % ) ratio 0.70  p 0 % improvement from saba p 0 prior to study with DLCO  9.87 (51%)   and FV curve min concave and ERV 39% at wt 200   - Allergy screen 11/04/2022 >  Eos 0.1 /  IgE  100 - 11/04/2022 changed to duoneb up to qid with budesonide  0.25 mg in place of trelegy as does not fit criteria for copd and more of an AB pattern  for which dpi may aggravate the cough component   >>> 03/03/2023  After extensive coaching inhaler device,  effectiveness =    75% (short Ti) continue proair  prn and maint with duoneb/bud 0.5 bid   Doing much better on duoneb/bud as above plus has proair  prn:  Re SABA :  I spent extra time with pt today reviewing appropriate use of albuterol  for prn use on exertion with the following points: 1) saba is for relief of sob that does not improve by walking a slower pace or resting but rather if the pt does not improve after trying this first. 2) If the pt is convinced, as many are, that saba helps recover from activity faster then it's easy to tell if this is the case by re-challenging : ie stop, take the inhaler, then p 5 minutes try the exact same activity (intensity of workload) that just caused the symptoms and see if they are substantially diminished or not after saba 3) if there is an activity that reproducibly causes the symptoms, try the saba 15 min before the activity on alternate days   If in fact the saba really does help, then fine to continue to use it prn but advised may need to look closer at the maintenance regimen being used to achieve better control of airways disease with exertion.

## 2023-03-03 NOTE — Patient Instructions (Addendum)
 Work on inhaler technique:  relax and gently blow all the way out then take a nice smooth full deep breath back in, triggering the inhaler at same time you start breathing in.  Hold breath in for at least  5 seconds if you can.    Only use your albuterol  (PROAIR )  as a rescue medication to be used if you can't catch your breath by resting or doing a relaxed purse lip breathing pattern.  - The less you use it, the better it will work when you need it. - Ok to use up to 2 puffs  every 4 hours if you must but call for immediate appointment if use goes up over your usual need - Don't leave home without it !!  (think of it like the spare tire for your car)   Also  Ok to try albuterol  15 min before an activity (on alternating days)  that you know would usually make you short of breath and see if it makes any difference and if makes none then don't take albuterol  after activity unless you can't catch your breath as this means it's the resting that helps, not the albuterol .  Please schedule a follow up visit in 6 months but call sooner if needed

## 2023-03-04 LAB — NMR, LIPOPROFILE
Cholesterol, Total: 138 mg/dL (ref 100–199)
HDL Particle Number: 28.4 umol/L — ABNORMAL LOW (ref >=30.5)
HDL-C: 52 mg/dL (ref >39)
LDL Particle Number: 923 nmol/L (ref <1000)
LDL Size: 20.8 nmol (ref >20.5)
LDL-C (NIH Calc): 70 mg/dL (ref 0–99)
LP-IR Score: 41 (ref <=45)
Small LDL Particle Number: 474 nmol/L (ref <=527)
Triglycerides: 81 mg/dL (ref 0–149)

## 2023-03-04 LAB — COMPREHENSIVE METABOLIC PANEL
ALT: 13 [IU]/L (ref 0–32)
AST: 14 [IU]/L (ref 0–40)
Albumin: 3.6 g/dL — ABNORMAL LOW (ref 3.8–4.8)
Alkaline Phosphatase: 101 [IU]/L (ref 44–121)
BUN/Creatinine Ratio: 11 — ABNORMAL LOW (ref 12–28)
BUN: 13 mg/dL (ref 8–27)
Bilirubin Total: 0.4 mg/dL (ref 0.0–1.2)
CO2: 23 mmol/L (ref 20–29)
Calcium: 9.3 mg/dL (ref 8.7–10.3)
Chloride: 106 mmol/L (ref 96–106)
Creatinine, Ser: 1.23 mg/dL — ABNORMAL HIGH (ref 0.57–1.00)
Globulin, Total: 2.7 g/dL (ref 1.5–4.5)
Glucose: 105 mg/dL — ABNORMAL HIGH (ref 70–99)
Potassium: 4.3 mmol/L (ref 3.5–5.2)
Sodium: 143 mmol/L (ref 134–144)
Total Protein: 6.3 g/dL (ref 6.0–8.5)
eGFR: 46 mL/min/{1.73_m2} — ABNORMAL LOW (ref >59)

## 2023-03-04 LAB — VITAMIN D 25 HYDROXY (VIT D DEFICIENCY, FRACTURES): Vit D, 25-Hydroxy: 33.5 ng/mL (ref 30.0–100.0)

## 2023-03-05 ENCOUNTER — Encounter: Payer: Self-pay | Admitting: Internal Medicine

## 2023-05-22 ENCOUNTER — Other Ambulatory Visit: Payer: Self-pay | Admitting: Student

## 2023-05-22 DIAGNOSIS — I4821 Permanent atrial fibrillation: Secondary | ICD-10-CM

## 2023-05-22 NOTE — Telephone Encounter (Signed)
 Prescription refill request for Xarelto  received.  Indication:afib Last office visit:12/24 Weight:91.4  kg Age:73 Scr:1.23  2/25 CrCl:57.9  ml/min  Prescription refilled

## 2023-05-26 ENCOUNTER — Telehealth: Payer: Self-pay | Admitting: Radiology

## 2023-05-26 NOTE — Telephone Encounter (Signed)
 Please see message from [place] office on patient below and advise.  Patient of Dr. Murrel Arnt.   Pt would like a med refill for ibuprofen .

## 2023-05-27 ENCOUNTER — Other Ambulatory Visit: Payer: Self-pay | Admitting: Orthopaedic Surgery

## 2023-05-27 MED ORDER — IBUPROFEN 800 MG PO TABS: 800.0000 mg | ORAL_TABLET | Freq: Three times a day (TID) | ORAL | 3 refills | Status: AC | PRN

## 2023-06-07 NOTE — Progress Notes (Deleted)
 Appt cancelled

## 2023-06-09 ENCOUNTER — Ambulatory Visit: Payer: 59 | Admitting: Internal Medicine

## 2023-07-27 NOTE — Progress Notes (Deleted)
 Pt did not show for appt

## 2023-07-29 ENCOUNTER — Ambulatory Visit: Admitting: Internal Medicine

## 2023-08-06 ENCOUNTER — Encounter: Payer: Self-pay | Admitting: Nurse Practitioner

## 2023-08-06 ENCOUNTER — Ambulatory Visit: Attending: Nurse Practitioner | Admitting: Nurse Practitioner

## 2023-08-06 VITALS — BP 118/72 | HR 77 | Ht 62.0 in | Wt 200.3 lb

## 2023-08-06 DIAGNOSIS — N1831 Chronic kidney disease, stage 3a: Secondary | ICD-10-CM | POA: Diagnosis not present

## 2023-08-06 DIAGNOSIS — I38 Endocarditis, valve unspecified: Secondary | ICD-10-CM

## 2023-08-06 DIAGNOSIS — I10 Essential (primary) hypertension: Secondary | ICD-10-CM

## 2023-08-06 DIAGNOSIS — Z720 Tobacco use: Secondary | ICD-10-CM

## 2023-08-06 DIAGNOSIS — I4821 Permanent atrial fibrillation: Secondary | ICD-10-CM | POA: Diagnosis not present

## 2023-08-06 DIAGNOSIS — E785 Hyperlipidemia, unspecified: Secondary | ICD-10-CM | POA: Diagnosis not present

## 2023-08-06 DIAGNOSIS — E669 Obesity, unspecified: Secondary | ICD-10-CM

## 2023-08-06 NOTE — Progress Notes (Signed)
 Office Visit    Patient Name: Tracy Johnston Date of Encounter: 08/06/2023 PCP:  Tobie Guy, DO [facility]  Cardiologist:  Vina Gull, MD  Advanced Practice Provider:  No care team member to display Electrophysiologist:  None   Chief Complaint and HPI    Tracy Johnston is a 73 y.o. female with a hx of HTN, permanent A-fib, CHF, valvular insufficiency, tobacco abuse, CKD, HLD, and COPD with O2 use, who presents today for scheduled follow-up.    Last seen by Dr. Gull on December 29, 2022. Was doing well at the time.   Today she presents for follow-up. Doing well. Wants assistance for weight loss. Denies any chest pain, shortness of breath, palpitations, syncope, presyncope, dizziness, orthopnea, PND, swelling or significant weight changes, acute bleeding, or claudication.  ROS: Negative. See HPI.  EKGs/Labs/Other Studies Reviewed:   The following studies were reviewed today:   EKG:  EKG is not ordered today.     Echo 12/2021:  1. Left ventricular ejection fraction, by estimation, is 55 to 60%. The  left ventricle has normal function. The left ventricle has no regional  wall motion abnormalities. Left ventricular diastolic function could not  be evaluated. The average left  ventricular global longitudinal strain is -16.7 %. The global longitudinal  strain is abnormal.   2. Right ventricular systolic function is mildly reduced. The right  ventricular size is normal.   3. Left atrial size was severely dilated.   4. Right atrial size was mild to moderately dilated.   5. A small pericardial effusion is present. The pericardial effusion is  circumferential.   6. The mitral valve is grossly normal. Mild mitral valve regurgitation.  No evidence of mitral stenosis.   7. The aortic valve was not well visualized. Aortic valve regurgitation  is moderate. No aortic stenosis is present.   8. The inferior vena cava is normal in size with greater than 50%   respiratory variability, suggesting right atrial pressure of 3 mmHg.   Comparison(s): Prior images reviewed side by side. Changes from prior  study are noted. Stable LVEF and stable moderate AR.  Myoview  04/2020: There was no ST segment deviation noted during stress. The study is normal. There are no perfusion defects consistent with prior infarct or current ischemia. This is a low risk study. The left ventricular ejection fraction is normal (55-65%).  Recent Labs: 11/04/2022: Hemoglobin 16.2; Platelets 186 03/03/2023: ALT 13; BUN 13; Creatinine, Ser 1.23; Potassium 4.3; Sodium 143  Recent Lipid Panel No results found for: CHOL, TRIG, HDL, CHOLHDL, VLDL, LDLCALC, LDLDIRECT  Risk Assessment/Calculations:    The 10-year ASCVD risk score (Arnett DK, et al., 2019) is: 10.6%   Values used to calculate the score:     Age: 62 years     Clincally relevant sex: Female     Is Non-Hispanic African American: Yes     Diabetic: No     Tobacco smoker: No     Systolic Blood Pressure: 118 mmHg     Is BP treated: Yes     HDL Cholesterol: 67 mg/dL     Total Cholesterol: 138 mg/dL  Review of Systems    All other systems reviewed and are otherwise negative except as noted above.  Physical Exam    VS:  BP 118/72   Pulse 77   Ht 5' 2 (1.575 m)   Wt 200 lb 4.8 oz (90.9 kg)   SpO2 99%   BMI 36.64 kg/m  ,  BMI Body mass index is 36.64 kg/m.  Wt Readings from Last 3 Encounters:  08/06/23 200 lb 4.8 oz (90.9 kg)  03/03/23 201 lb 9.6 oz (91.4 kg)  12/31/22 208 lb (94.3 kg)   GEN: Well nourished, well developed, in no acute distress. HEENT: normal. Neck: Supple, no JVD, carotid bruits, or masses. Cardiac: S1/S2, irregular rhythm and regular rate, no murmurs, rubs, or gallops. No clubbing, cyanosis. No edema to BLE. Radials/PT 2+ and equal bilaterally.  Respiratory:  Respirations regular and unlabored, diminished wheezing to auscultation bilaterally. Smoker's cough noted.  MS:  No deformity or atrophy. Skin: Warm and dry, no rash. Neuro:  Strength and sensation are intact. Psych: Normal affect.  Assessment & Plan    Permanent A-fib Denies any tachycardia or palpitations.  Heart rate well-controlled today.  Continue diltiazem .  Denies any bleeding issues on Xarelto .  Continue Xarelto , on appropriate dosage. Heart healthy diet and regular cardiovascular exercise encouraged.   HTN Blood pressure stable. Continue current medication regimen. Discussed to monitor BP at home at least 2 hours after medications and sitting for 5-10 minutes. Heart healthy diet and regular cardiovascular exercise encouraged.    3. HLD LDL 70 02/2023. Tolerating Crestor  well.  No medication changes at this time.  Heart healthy diet and regular cardiovascular exercise encouraged.   5. CKD stage 3a Most recent labs on file are stable. Avoid nephrotoxic agents.  No medication changes at this time.  Continue follow-up with PCP.  6. Valvular insufficiency Echo 12/2021 revealed moderate aortic valve regurgitation, mild mitral valve regurgitation.  Patient is asymptomatic.  Recommend updating echocardiogram at next office visit.   7. COPD Denies any recent worsening symptoms or recent acute exacerbations.  Continue to follow-up with Dr. Darlean.  8.  Tobacco abuse Smoking cessation encouraged and discussed.  9. Obesity Weight loss via diet and exercise encouraged. Discussed the impact being overweight would have on cardiovascular risk. Will consult pharm D to see if she is a candidate for [facility].     Disposition: Follow up in 6 month(s) with Vina Gull, MD or APP.  Signed, Almarie Crate, NP

## 2023-08-06 NOTE — Patient Instructions (Signed)

## 2023-08-10 ENCOUNTER — Telehealth: Payer: Self-pay | Admitting: Nurse Practitioner

## 2023-08-10 NOTE — Telephone Encounter (Signed)
 Pt c/o medication issue:  1. Name of Medication: Wegovy  2. How are you currently taking this medication (dosage and times per day)?   3. Are you having a reaction (difficulty breathing--STAT)?   4. What is your medication issue? Pt said E Miriam called this medication in for her but the pharmacy doesn't have it

## 2023-08-11 NOTE — Telephone Encounter (Signed)
 MyChart message sent to patient.

## 2023-08-28 ENCOUNTER — Telehealth: Payer: Self-pay | Admitting: Nurse Practitioner

## 2023-08-28 DIAGNOSIS — E669 Obesity, unspecified: Secondary | ICD-10-CM

## 2023-08-28 NOTE — Telephone Encounter (Signed)
 Pt is calling to ask about the referral for [facility] because she doesn't qualify for the [facility]

## 2023-08-31 NOTE — Telephone Encounter (Signed)
 Referral place and MyChart message sent to patient.

## 2023-09-01 ENCOUNTER — Encounter (INDEPENDENT_AMBULATORY_CARE_PROVIDER_SITE_OTHER): Payer: Self-pay

## 2023-09-06 NOTE — Progress Notes (Signed)
 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/ uses [facility] parking  Cough: worse 1st thing in am / better p prednisone   Sleep: bed is flat/ 3 pillows under head s resp rx  SABA use: just trelegy / once neb [place] 02: 2lpm / has meter says over 90s Lung cancer screen: per [facility] Rec Plan A = Automatic = Always=    Trelegy 100 take one click 1st in am and 2 two breaths - tug hard to get it going Work on inhaler technique:  Plan B = Backup (to supplement plan A, not to replace it) Only use your albuterol  inhaler as a rescue medication Plan C = Crisis (instead of Plan B but only if Plan B stops working) - only use your albuterol  nebulizer if you first try Plan B and it fails to help Plan D = Deltasone  = prednisone (refillable)  If ABC not working, Prednisone  10 mg take  4 each am x 2 days,   2 each am x 2 days,  1 each am x 2 days and stop  For cough > mucinex 1200 mg every 12 hours as needed  Please schedule a follow up office visit in 6 weeks, call sooner if needed with all medications /inhalers/ solutions in hand         12/03/2022  f/u ov/[place] office/Tracy Johnston re: copd 0/ CB  maint on duoneb/bud   did not bring meds  / confused again with instructions esp action plan  Chief Complaint  Patient presents with   COPD GOLD 0 / CB  Dyspnea:  no longer doing any ex / does [employer]  most the time ok slow pace pushing  the buggy  Cough: min mucoid esp in am  Sleeping: flat bed/ wedge pillow s noct   resp cc  SABA use: just when out may use proair  occ  like  going to church  02: 2lpm hs and none daytime Lung cancer screening: 11/12/22  Rec Duoneb  up to 4 x daily with budesonide   0.25 with each dose  As needed albuterol  (RED =Proair ) up to every 4 hours if you can't catch your breath Only use prednisone  if above not working to your satisfaction= Prednisone  10 mg take  4 each am x 2 days,   2 each am x 2 days,  1 each am x 2 days and stop  Please schedule a follow up office visit in 4 weeks, sooner if needed  with all medications /inhalers/ solutions in hand    12/31/2022  f/u ov/[place] office/Tracy Johnston re: COPD GOLD 0/AB  maint on nebs/budesonide  (but no on any duoneb as rec above)   did  bring meds  Chief Complaint  Patient presents with   COPD   Shortness of Breath   Dyspnea:  walks treadmill x 20 min slow pace flat  Cough: worse in am ? Mucoid  Sleeping: flat bed/ wedge pillow s  resp cc  SABA use:  tid neb /rarely needing hfa  02: 2lpm hs  LCS :  Oct 2024  Rec Change  budesonide  to 0.5 mg twice daily with duoneb (combination of albuterol  and ipatropium)  If needed to catch your breath  in between the twice daily duoneb, then use albuterol  either in the inhaler (PROAIR )  or nebulizer up to every 4 hours AS NEEDED  Work on inhaler technique:   Please schedule a follow up office visit in 6 weeks, call sooner if needed with all  respiratory medications /inhalers/ solutions in han   03/03/2023  f/u ov/[place] office/Tracy Johnston re: COPD / GOLD/ CB  maint on duoneb/budesonide  bid  did not  bring all resp meds  Chief Complaint  Patient presents with   Follow-up    Follow up for COPD   Dyspnea:  goes to church / grocery shopping/ planning to start back treadmill but needing scooter for wm Cough: none  Sleeping: flat bed/ wedge pillow s  resp cc  SABA use: rarely = once every 2 weeks 02: 2lpm hs  Lung cancer screening: done  q Oct at [facility]  Rec Work on inhaler technique:   Only use your albuterol  (PROAIR )  as a rescue medication  Also  Ok  to try albuterol  15 min before an activity (on alternating days)  that you know would usually make you short of breath      09/09/2023  6 m f/u ov/[place] office/Tracy Johnston re: AB  maint on duoneb/budesonide  bid still smoking some Chief Complaint  Patient presents with   COPD    17m f/u - shob as usual   Dyspnea:  go to church/ [employer] gets let off at door /sometimes riding scooter esp at [employer]  Cough: clear mucus/ esp productive  in am clears after sev hours but not using duoneb/bud 1st thing as rec  Sleeping: flat wedge pillow s  noct   resp cc  SABA use: only when goes out and not using on alternate days with alb neb vs hfa vs nothing prior to exertion  02: 2lpm hs     No obvious day to day or daytime variability or assoc mucus plugs or hemoptysis or cp or chest tightness, subjective wheeze or overt sinus or hb symptoms.    Also denies any obvious fluctuation of symptoms with weather or environmental changes or other aggravating or alleviating factors except as outlined above   No unusual exposure hx or h/o childhood pna/ asthma or knowledge of premature birth.  Current Allergies, Complete Past Medical History, Past Surgical History, Family History, and Social History were reviewed in [employer] record.  ROS  The following are not active complaints unless bolded Hoarseness, sore throat, dysphagia, dental problems, itching, sneezing,  nasal congestion or discharge of excess mucus or purulent secretions, ear ache,   fever, chills, sweats, unintended wt loss or wt gain, classically pleuritic or exertional cp,  orthopnea pnd or arm/hand swelling  or leg swelling, presyncope, palpitations, abdominal pain, anorexia, nausea, vomiting, diarrhea  or change in bowel habits or change in bladder habits, change in stools or change in urine, dysuria, hematuria,  rash, arthralgias, visual complaints, headache, numbness, weakness or ataxia or problems with walking or coordination,   change in mood or  memory.        Current Meds  Medication Sig   albuterol  (PROAIR  HFA) 108 (90 Base) MCG/ACT inhaler 2 puffs every 4 hours as needed only  if your can't catch your breath   albuterol  (  PROVENTIL ) (2.5 MG/3ML) 0.083% nebulizer solution Take 3 mLs (2.5 mg total) by nebulization every 6 (six) hours as needed for wheezing or shortness of breath.   allopurinol  (ZYLOPRIM ) 300 MG tablet TAKE 1 TABLET BY MOUTH DAILY.   budesonide  (PULMICORT ) 0.5 MG/2ML nebulizer solution Take 2 mLs (0.5 mg total) by nebulization 2 (two) times daily.   colchicine 0.6 MG tablet Take 0.6 mg by mouth as needed.   diltiazem  (CARDIZEM  CD) 240 MG 24 hr capsule Take 1 capsule (240 mg total) by mouth daily.   famotidine  (PEPCID ) 20 MG tablet TAKE 1 TABLET BY MOUTH DAILY AFTER SUPPER   furosemide  (LASIX ) 40 MG tablet TAKE 1 TABLET BY MOUTH DAILY   ibuprofen  (ADVIL ) 800 MG tablet Take 1 tablet (800 mg total) by mouth every 8 (eight) hours as needed.   ipratropium-albuterol  (DUONEB) 0.5-2.5 (3) MG/3ML SOLN Take 3 mLs by nebulization 2 (two) times daily.   olmesartan -hydrochlorothiazide  (BENICAR  HCT) 40-12.5 MG per tablet Take 1 tablet by mouth daily.     Oxycodone  HCl 10 MG TABS Take 1 tablet 4 times a day by oral route as needed.   pantoprazole  (PROTONIX ) 40 MG tablet TAKE 1 TABLET BY MOUTH ONCE DAILY 30-60 MINUTES BEFORE first meal of THE DAY   polyethylene glycol (MIRALAX  / GLYCOLAX ) 17 g packet Take 17 g by mouth daily.   predniSONE  (DELTASONE ) 10 MG tablet Take 10 mg by mouth daily with breakfast.   rosuvastatin  (CRESTOR ) 40 MG tablet Take 1 tablet (40 mg total) by mouth daily.   solifenacin (VESICARE) 10 MG tablet Take 10 mg by mouth daily.   triamcinolone ointment (KENALOG) 0.1 % Apply 1 Application topically 2 (two) times daily.   XARELTO  20 MG TABS tablet TAKE 1 TABLET BY MOUTH DAILY WITH SUPPER                      Past Medical History:  Diagnosis Date   Arthritis    Asthma    Atrial  fibrillation (HCC)    chronic. Was first diagnosed in her early 84s.   Cancer (HCC) 2003ish   , cervix   COPD (chronic obstructive pulmonary disease) (HCC)    Dysrhythmia    Heart murmur    Hypertension    Insomnia    Malignant neoplasm of kidney (HCC)    rt removed 2003   Mitral regurgitation    Mild to moderate. Normal EF 09/2010   Renal insufficiency    Sickle cell anemia (HCC)    Tobacco abuse        Objective:    Wts  09/09/2023      201  03/03/2023        201  12/31/2022      208  12/03/2022       206  11/04/2022      208   05/05/2022        206   03/27/22 207 lb (93.9 kg)  02/11/22 202 lb 12.8 oz (92 kg)  12/27/21 199 lb (90.3 kg)  Vital signs reviewed  09/09/2023  - Note at rest 02 sats  98% on RA   General appearance:    amb elderly bf nad / struggles to get on to exam table due to L knee    HEENT : Oropharynx  clear   Nasal turbinates nl    NECK :  without  apparent JVD/ palpable Nodes/TM    LUNGS: no acc muscle use,  Min barrel  contour chest  wall with bilateral  slightly decreased bs s audible wheeze and  without cough on insp or exp maneuvers and min  Hyperresonant  to  percussion bilaterally    CV:  RRR  no s3 or murmur or increase in P2, and no edema   ABD:  soft and nontender    MS:   ext warm without deformities Or obvious joint restrictions  calf tenderness, cyanosis or clubbing     SKIN: warm and dry without lesions    NEURO:  alert, approp, nl sensorium with  no motor or cerebellar deficits apparent.                     Assessment   Assessment & Plan COPD GOLD 0 /  CB Active but minimal  smoker with centrilobular emphysema on LDST 08/2021  - 02/11/2022  After extensive coaching inhaler device,  effectiveness =    75% with hfa and dpi  -  03/27/2022   Walked on RA  x  3  lap(s) =  approx 450  ft  @ mod pace, stopped due to end of study with lowest 02 sats 91% and  mild sob p 300 ft but not need to stop -  PFT's  04/08/22  FEV1 1.14 (52 % )  ratio 0.70  p 0 % improvement from saba p 0 prior to study with DLCO  9.87 (51%)   and FV curve min concave and ERV 39% at wt 200   - Allergy screen 11/04/2022 >  Eos 0.1 /  IgE  100 - 11/04/2022 changed to duoneb up to qid with budesonide  0.25 mg in place of trelegy as does not fit criteria for copd and more of an AB pattern  for which dpi may aggravate the cough component  - 03/03/2023  After extensive coaching inhaler device,  effectiveness =    75% (short Ti) continue proair  prn and maint with duoneb/bud 0.5 bid   Lungs are clear on exam but by hx has lots of issues with CB so rec  Rec: Continue gerd rx Max mucinex and add flutter next  Use neb 1st thing in am as cough/congestion  worst on arising  Stop smoking (see sep a/p)    Cigarette smoker  Counseled re importance of smoking cessation but did not meet time criteria for separate billing      Advised: CB symptoms are directly linked to mucociliary dysfunction from smoking and should improve w/in 6 weeks of quitting all smoking       Nocturnal hypoxemia On 2lpm HS at 1st pulmonary ov 02/11/2022  --  03/27/2022   Walked on RA  x  3  lap(s) =  approx 450  ft  @ mod pace, stopped due to end of study with lowest 02 sats 91% and  mild sob p 300 ft but not need to stop - 09/09/2023   Walked on RA  x   approx 100  ft  @ slow  pace, stopped due to knee popping  with lowest 02 sats 93%   No need for 02 with present level of exertion / nor at rest.   Each maintenance medication was reviewed in detail including emphasizing most importantly the difference between maintenance and prns and under what circumstances the prns are to be triggered using an action plan format where appropriate.  Total time for H and P, chart review, counseling, reviewing hfa/ neb/02 device(s) , directly observing portions of ambulatory 02 saturation study/ and  generating customized AVS unique to this office visit / same day charting = 30 min                   Patient Instructions  For cough/ congestion >  over the counter mucinex or  mucinex dm  up to maximum of  1200 mg every 12 hours as needed  Take nebulizer treatment 1st thing in AM when wake up and another dose 12 hours later  The key is to stop smoking completely before smoking completely stops you!  Please schedule a follow up visit in 3 months but call sooner if needed  with all medications /inhalers/ solutions in hand so we can verify exactly what you are taking. This includes all medications from all doctors and over the counters             Ozell America, MD 09/09/2023

## 2023-09-09 ENCOUNTER — Encounter: Payer: Self-pay | Admitting: Internal Medicine

## 2023-09-09 ENCOUNTER — Ambulatory Visit: Admitting: Internal Medicine

## 2023-09-09 VITALS — BP 135/75 | HR 74 | Ht 62.0 in | Wt 201.2 lb

## 2023-09-09 DIAGNOSIS — R0902 Hypoxemia: Secondary | ICD-10-CM | POA: Diagnosis not present

## 2023-09-09 DIAGNOSIS — J449 Chronic obstructive pulmonary disease, unspecified: Secondary | ICD-10-CM | POA: Diagnosis not present

## 2023-09-09 DIAGNOSIS — F17210 Nicotine dependence, cigarettes, uncomplicated: Secondary | ICD-10-CM | POA: Diagnosis not present

## 2023-09-09 DIAGNOSIS — G4734 Idiopathic sleep related nonobstructive alveolar hypoventilation: Secondary | ICD-10-CM

## 2023-09-09 NOTE — Assessment & Plan Note (Addendum)
 On 2lpm HS at 1st pulmonary ov 02/11/2022  --  03/27/2022   Walked on RA  x  3  lap(s) =  approx 450  ft  @ mod pace, stopped due to end of study with lowest 02 sats 91% and  mild sob p 300 ft but not need to stop - 09/09/2023   Walked on RA  x   approx 100  ft  @ slow  pace, stopped due to knee popping  with lowest 02 sats 93%   No need for 02 with present level of exertion / nor at rest.   Each maintenance medication was reviewed in detail including emphasizing most importantly the difference between maintenance and prns and under what circumstances the prns are to be triggered using an action plan format where appropriate.  Total time for H and P, chart review, counseling, reviewing hfa/ neb/02 device(s) , directly observing portions of ambulatory 02 saturation study/ and generating customized AVS unique to this office visit / same day charting = 30 min

## 2023-09-09 NOTE — Patient Instructions (Addendum)
 For cough/ congestion >  over the counter mucinex or  mucinex dm  up to maximum of  1200 mg every 12 hours as needed  Take nebulizer treatment 1st thing in AM when wake up and another dose 12 hours later  The key is to stop smoking completely before smoking completely stops you!  Please schedule a follow up visit in 3 months but call sooner if needed  with all medications /inhalers/ solutions in hand so we can verify exactly what you are taking. This includes all medications from all doctors and over the counters

## 2023-09-09 NOTE — Assessment & Plan Note (Addendum)
 Active but minimal  smoker with centrilobular emphysema on LDST 08/2021  - 02/11/2022  After extensive coaching inhaler device,  effectiveness =    75% with hfa and dpi  -  03/27/2022   Walked on RA  x  3  lap(s) =  approx 450  ft  @ mod pace, stopped due to end of study with lowest 02 sats 91% and  mild sob p 300 ft but not need to stop -  PFT's  04/08/22  FEV1 1.14 (52 % ) ratio 0.70  p 0 % improvement from saba p 0 prior to study with DLCO  9.87 (51%)   and FV curve min concave and ERV 39% at wt 200   - Allergy screen 11/04/2022 >  Eos 0.1 /  IgE  100 - 11/04/2022 changed to duoneb up to qid with budesonide  0.25 mg in place of trelegy as does not fit criteria for copd and more of an AB pattern  for which dpi may aggravate the cough component  - 03/03/2023  After extensive coaching inhaler device,  effectiveness =    75% (short Ti) continue proair  prn and maint with duoneb/bud 0.5 bid   Lungs are clear on exam but by hx has lots of issues with CB so rec  Rec: Continue gerd rx Max mucinex and add flutter next  Use neb 1st thing in am as cough/congestion  worst on arising  Stop smoking (see sep a/p)

## 2023-09-09 NOTE — Assessment & Plan Note (Addendum)
 Counseled re importance of smoking cessation but did not meet time criteria for separate billing      Advised: CB symptoms are directly linked to mucociliary dysfunction from smoking and should improve w/in 6 weeks of quitting all smoking

## 2023-09-10 LAB — COLOGUARD: COLOGUARD: NEGATIVE

## 2023-09-18 ENCOUNTER — Ambulatory Visit: Admitting: Nurse Practitioner

## 2023-11-16 NOTE — Therapy (Incomplete)
 OUTPATIENT PHYSICAL THERAPY EVALUATION   Patient Name: Tracy Johnston MRN: [id number] DOB:Oct 02, 1950, 73 y.o., female Today's Date: 11/16/2023  END OF SESSION:   Past Medical History:  Diagnosis Date   Arthritis    Asthma    Atrial fibrillation (HCC)    chronic. Was first diagnosed in her early 78s.   CAD (coronary artery disease) 09/06/2021   Cancer (HCC) 2003ish   , cervix   COPD (chronic obstructive pulmonary disease) (HCC)    Dysrhythmia    Heart murmur    Hypertension    Insomnia    Malignant neoplasm of kidney (HCC)    rt removed 2003   Mitral regurgitation    Mild to moderate. Normal EF 09/2010   Renal insufficiency    Sickle cell anemia (HCC)    Tobacco abuse    Past Surgical History:  Procedure Laterality Date   ABDOMINAL HYSTERECTOMY     arm fracture     rods, pins and bone graft.- to left arm from hip   BONE GRAFT HIP ILIAC CREST     to left arm   CERVICAL CONIZATION W/BX     NEPHRECTOMY  2003   TONSILLECTOMY     TOTAL HIP ARTHROPLASTY  10/17/2011   Procedure: TOTAL HIP ARTHROPLASTY;  Surgeon: Oneil JAYSON Herald, MD;  Location: [facility];  Service: Orthopedics;  Laterality: Left;  Left Total Hip Arthroplasty   TOTAL HIP ARTHROPLASTY  01/02/2012   Procedure: TOTAL HIP ARTHROPLASTY;  Surgeon: Oneil JAYSON Herald, MD;  Location: [facility];  Service: Orthopedics;  Laterality: Right;  Right Total Hip Arthroplasty   TOTAL KNEE ARTHROPLASTY Left 06/15/2020   Procedure: LEFT TOTAL KNEE ARTHROPLASTY;  Surgeon: Herald Oneil JAYSON, MD;  Location: [facility] OR;  Service: Orthopedics;  Laterality: Left;  Needs RNFA   TUBAL LIGATION     Patient Active Problem List   Diagnosis Date Noted   Impingement syndrome of right shoulder 12/10/2022   Calculus of gallbladder without cholecystitis without obstruction 11/27/2022   Cigarette smoker 05/05/2022   Nocturnal hypoxemia 02/11/2022   CAD (coronary artery disease) 09/06/2021   Centrilobular emphysema (HCC) 09/06/2021   Quadriceps weakness 10/18/2020    RUQ pain    Wheezing    S/P total knee arthroplasty, 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   Ventral hernia without obstruction or gangrene 04/18/2020   BMI 33.0-33.9,adult 08/12/2018   HLD (hyperlipidemia) 08/12/2018   LLQ abdominal pain 08/12/2018   Mixed stress and urge urinary incontinence 08/12/2018   History of bilateral hip arthroplasty 07/09/2017   Long-term current use of opiate analgesic 02/18/2017   On long term drug therapy 02/06/2017   Chronic low back pain 02/06/2017   Chronic pain syndrome 02/06/2017   Pain in left knee 11/22/2015   Incisional hernia, without obstruction or gangrene 09/09/2012   Acute blood loss anemia 10/20/2011    Class: Acute   COPD GOLD 0 /  CB 03/03/2011   Dyspnea 12/02/2010   Preop cardiovascular exam 10/31/2010   Claudication 10/18/2010   Atrial fibrillation (HCC)    Tobacco abuse    Hypertension     PCP: Tobie Guy, DO   REFERRING PROVIDER: Jacques Arlon PARAS, MD   REFERRING DIAG: Other malaise, Physical deconditioning.   Rationale for Evaluation and Treatment:  Rehabiliation  THERAPY DIAG:  No diagnosis found.  ONSET DATE: 3 months onset   SUBJECTIVE:  SUBJECTIVE STATEMENT: She relays pain and stiffness in her joints all the time. She denies N/T in arms or legs. She does say something is going on in her right arm and she can not use it well anymore. She does relay arthritis and chronic back pain. She does get out of breath easily. She relays poor balance and walks with cane.   PERTINENT HISTORY:  See above PMH, emphysema  PAIN:  NPRS scale: 7/10 upon arrival Pain location:back, and joints Pain description: constant, can be sharp or ache Aggravating factors: standing and walking Relieving factors: bending over, sitting down   PRECAUTIONS: ,  Fall  RED  FLAGS: None   WEIGHT BEARING RESTRICTIONS:  No  FALLS:  Has patient fallen in last 6 months? No  PLOF:  Needs assistance with ADLs and Needs assistance with homemaking, dressing can take her one hour  PATIENT GOALS: walk bettter, get more active, less pain, less stiffness   OBJECTIVE:  Note: Objective measures were completed at Evaluation unless otherwise noted.  PATIENT SURVEYS:  Patient-Specific Activity Scoring Scheme  0 represents "unable to perform." 10 represents "able to perform at prior level. 0 1 2 3 4 5 6 7 8 9  10 (Date and Score)   Activity Eval     1. Walking 10 minutes  2    2. Dressing 3    3.     4.    5.    Score 2.5    Total score = sum of the activity scores/number of activities Minimum detectable change (90%CI) for average score = 2 points Minimum detectable change (90%CI) for single activity score = 3 points    POSTURE:  {posture:25561}  GAIT: Assistive device utilized: {Assistive devices:23999} Level of assistance: {Levels of assistance:24026} Comments: ***    UPPER EXTREMITY ROM:  Active ROM Right eval Left eval  Shoulder flexion 40/90 130  Shoulder extension    Shoulder abduction    Shoulder adduction    Shoulder extension    Shoulder internal rotation    Shoulder external rotation    Elbow flexion    Elbow extension    Wrist flexion    Wrist extension    Wrist ulnar deviation    Wrist radial deviation    Wrist pronation    Wrist supination     (Blank rows = not tested)   UPPER EXTREMITY MMT:  MMT Right eval Left eval  Shoulder flexion 2 4  Shoulder extension    Shoulder abduction 2 5  Shoulder adduction    Shoulder extension    Shoulder internal rotation 3 4  Shoulder external rotation 3 4  Middle trapezius    Lower trapezius    Elbow flexion 3 4  Elbow extension 3 4  Wrist flexion    Wrist extension    Wrist ulnar deviation    Wrist radial deviation    Wrist pronation    Wrist supination    Grip  strength 4 4   (Blank rows = not tested)   LUMBAR ROM:   Active  A/PROM  eval  Flexion 75%  Extension 10%  Right lateral flexion   Left lateral flexion   Right rotation 25%  Left rotation 25%   (Blank rows = not tested)   LOWER EXTREMITY STRENGTH     MMT in sitting Right eval Left eval  Hip flexion 3 3  Hip extension    Hip abduction 3 3  Hip adduction    Hip internal rotation  Hip external rotation    Knee flexion 3 3  Knee extension 3 3  Ankle dorsiflexion    Ankle plantarflexion    Ankle inversion    Ankle eversion     (Blank rows = not tested)   FUNCTIONAL TESTS:  11/17/23 5 times sit to stand: 43.25 seconds must use UE suppport, HR 64 and Spo2 98% despite shortness of breath) Timed up and go (TUG):36.03 seconds with SPC and supervision                                                                                                                               TREATMENT DATE:  Eval HEP creation and review with demonstration and trial set preformed, see below for details Selfcare:    PATIENT EDUCATION: Education details: HEP, PT plan of care, selfcare Person educated: Patient Education method: Explanation, Demonstration, Verbal cues, and Handouts Education comprehension: verbalized understanding, further education recommended   HOME EXERCISE PROGRAM: Access Code: [email or web address] URL: [email or web address] Date: 11/17/2023 Prepared by: Redell Moose  Exercises - Seated AAROM Shoulder Flexion  - 2 x daily - 6 x weekly - 3 sets - 10 reps - Standing 'L' Stretch at Counter  - 2 x daily - 6 x weekly - 1 sets - 10 reps - 5 hold - Standing March with Counter Support  - 2 x daily - 6 x weekly - 1 sets - 10 reps - Proper Sit to Stand Technique  - 2 x daily - 6 x weekly - 1-2 sets - 5 reps - Supine Shoulder Flexion Extension AAROM with Dowel  - 2 x daily - 6 x weekly - 1 sets - 10 reps - 5 sec hold  ASSESSMENT:  CLINICAL IMPRESSION: Patient  referred to PT for Physical deconditioning. She does have COPD, general weakness and poor endurance noted.  Patient will benefit from skilled PT to improve overall function and to address impairments and limitations listed below.  OBJECTIVE IMPAIRMENTS: decreased activity tolerance for ADL's, difficulty walking, decreased balance, decreased endurance, decreased mobility, decreased ROM, decreased strength, impaired flexibility, impaired UE/LE use, and pain.  ACTIVITY LIMITATIONS: bending, liftting, walking, standing, cleaning, community activity, driving, reaching, carry, occupation  PERSONAL FACTORS: see above PMH  also affecting patient's functional outcome.  REHAB POTENTIAL: {rehabpotential:25112}  CLINICAL DECISION MAKING: {clinical decision making:25114}  EVALUATION COMPLEXITY: {Evaluation complexity:25115}    GOALS: Short term PT Goals Target date: *** Pt will be I and compliant with HEP. Baseline:  Goal status: New Pt will decrease pain by 25% overall Baseline: Goal status: New  Long term PT goals Target date:*** Pt will improve ROM to [facility] to improve functional mobility Baseline: Goal status: New Pt will improve  strength to at least 4+/5 MMT to improve functional strength Baseline: Goal status: New Pt will improve Patient specific functional scale (PSFS) to at least /10 to show improved function level Baseline: Goal status: New Pt will reduce pain to overall less than 3/10  with usual activity and work activity. Baseline: Goal status: New Pt will be able to ambulate community distances at least 1000 ft WNL gait pattern without complaints Baseline: Goal status: New  PLAN: PT FREQUENCY: 1-3 times per week   PT DURATION: 6-8 weeks  PLANNED INTERVENTIONS  {rehab planned interventions:25118::97110-Therapeutic exercises,97530- Therapeutic [phone]- Neuromuscular re-education,97535- Self Rjmz,02859- Manual therapy,Patient/Family education}  PLAN FOR  NEXT SESSION: *** NEXT MD VISIT: PIERRETTE Redell JONELLE Maranda, PT 11/16/2023, 11:40 AM  .oprcpt

## 2023-11-17 ENCOUNTER — Encounter: Payer: Self-pay | Admitting: Physical Therapy

## 2023-11-17 ENCOUNTER — Ambulatory Visit: Admitting: Physical Therapy

## 2023-11-17 ENCOUNTER — Ambulatory Visit: Attending: Family Medicine | Admitting: Physical Therapy

## 2023-11-17 DIAGNOSIS — M6281 Muscle weakness (generalized): Secondary | ICD-10-CM | POA: Insufficient documentation

## 2023-11-17 DIAGNOSIS — M5459 Other low back pain: Secondary | ICD-10-CM | POA: Insufficient documentation

## 2023-11-17 DIAGNOSIS — R2689 Other abnormalities of gait and mobility: Secondary | ICD-10-CM | POA: Insufficient documentation

## 2023-11-17 NOTE — Therapy (Signed)
 OUTPATIENT PHYSICAL THERAPY EVALUATION   Patient Name: Tracy Johnston MRN: [id number] DOB:06/19/1950, 73 y.o., female Today's Date: 11/17/2023  END OF SESSION:  PT End of Session - 11/17/23 0847     Visit Number 1    Number of Visits 12    Date for Recertification  01/12/24    PT Start Time 0800    PT Stop Time 0848    PT Time Calculation (min) 48 min    Activity Tolerance Patient tolerated treatment well    Behavior During Therapy [facility] for tasks assessed/performed          Past Medical History:  Diagnosis Date   Arthritis    Asthma    Atrial fibrillation (HCC)    chronic. Was first diagnosed in her early 22s.   CAD (coronary artery disease) 09/06/2021   Cancer (HCC) 2003ish   , cervix   COPD (chronic obstructive pulmonary disease) (HCC)    Dysrhythmia    Heart murmur    Hypertension    Insomnia    Malignant neoplasm of kidney (HCC)    rt removed 2003   Mitral regurgitation    Mild to moderate. Normal EF 09/2010   Renal insufficiency    Sickle cell anemia (HCC)    Tobacco abuse    Past Surgical History:  Procedure Laterality Date   ABDOMINAL HYSTERECTOMY     arm fracture     rods, pins and bone graft.- to left arm from hip   BONE GRAFT HIP ILIAC CREST     to left arm   CERVICAL CONIZATION W/BX     NEPHRECTOMY  2003   TONSILLECTOMY     TOTAL HIP ARTHROPLASTY  10/17/2011   Procedure: TOTAL HIP ARTHROPLASTY;  Surgeon: Oneil JAYSON Herald, MD;  Location: [facility];  Service: Orthopedics;  Laterality: Left;  Left Total Hip Arthroplasty   TOTAL HIP ARTHROPLASTY  01/02/2012   Procedure: TOTAL HIP ARTHROPLASTY;  Surgeon: Oneil JAYSON Herald, MD;  Location: [facility];  Service: Orthopedics;  Laterality: Right;  Right Total Hip Arthroplasty   TOTAL KNEE ARTHROPLASTY Left 06/15/2020   Procedure: LEFT TOTAL KNEE ARTHROPLASTY;  Surgeon: Herald Oneil JAYSON, MD;  Location: [facility];  Service: Orthopedics;  Laterality: Left;  Needs RNFA   TUBAL LIGATION     Patient Active Problem List   Diagnosis Date  Noted   Impingement syndrome of right shoulder 12/10/2022   Calculus of gallbladder without cholecystitis without obstruction 11/27/2022   Cigarette smoker 05/05/2022   Nocturnal hypoxemia 02/11/2022   CAD (coronary artery disease) 09/06/2021   Centrilobular emphysema (HCC) 09/06/2021   Quadriceps weakness 10/18/2020   RUQ pain    Wheezing    S/P total knee arthroplasty, 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   Ventral hernia without obstruction or gangrene 04/18/2020   BMI 33.0-33.9,adult 08/12/2018   HLD (hyperlipidemia) 08/12/2018   LLQ abdominal pain 08/12/2018   Mixed stress and urge urinary incontinence 08/12/2018   History of bilateral hip arthroplasty 07/09/2017   Long-term current use of opiate analgesic 02/18/2017   On long term drug therapy 02/06/2017   Chronic low back pain 02/06/2017   Chronic pain syndrome 02/06/2017   Pain in left knee 11/22/2015   Incisional hernia, without obstruction or gangrene 09/09/2012   Acute blood loss anemia 10/20/2011    Class: Acute   COPD GOLD 0 /  CB 03/03/2011   Dyspnea 12/02/2010   Preop cardiovascular exam 10/31/2010   Claudication 10/18/2010   Atrial fibrillation (HCC)    Tobacco abuse  Hypertension     PCP: Tobie Guy, DO   REFERRING PROVIDER: Jacques Arlon PARAS, MD   REFERRING DIAG: Other malaise, Physical deconditioning.   Rationale for Evaluation and Treatment:  Rehabiliation  THERAPY DIAG:  Other abnormalities of gait and mobility  Muscle weakness (generalized)  Other low back pain  ONSET DATE: 3 month onset of worsening joint pain and stiffness, difficulty walking   SUBJECTIVE:                                                                                                                                                                                           SUBJECTIVE STATEMENT: She relays 3 month onset of worsening joint pain and stiffness, difficulty walking. She says her right arm has something going on and she can  not use it like she could before. Says she has to get help dressing and it takes her an hour. Says she is having trouble standing and walking, and balance. Her joints are all stiff and hurt all over, Relays chronic back pain. She denies N/T in her arms or legs  PERTINENT HISTORY:  See above PMH, emphysemia, bilat THA, TKA  PAIN:  NPRS scale: 7/10 upon arrival Pain location:back, all joints, right shoulder Pain description: constant, achy, burning Aggravating factors:  Relieving factors: rest, meds   PRECAUTIONS: ,  None  RED FLAGS: None   WEIGHT BEARING RESTRICTIONS:  No  FALLS:  Has patient fallen in last 6 months? No  PLOF:  Needs assistance with homemaking  PATIENT GOALS:  Get more active, less stiff, less pain, stand and walk better  OBJECTIVE:  Note: Objective measures were completed at Evaluation unless otherwise noted.  PATIENT SURVEYS:  Patient-Specific Activity Scoring Scheme  0 represents "unable to perform." 10 represents "able to perform at prior level. 0 1 2 3 4 5 6 7 8 9  10 (Date and Score)   Activity Eval     1. Walking for 10 minutes 2     2. Dressing 3     3.     4.    5.    Score 2.5    Total score = sum of the activity scores/number of activities Minimum detectable change (90%CI) for average score = 2 points Minimum detectable change (90%CI) for single activity score = 3 points  POSTURE:  flexed trunk   GAIT: Assistive device utilized: Single point cane Level of assistance: Modified independence Comments: slow gait speed, gait unsteadiness without AD    UPPER EXTREMITY ROM:  Active ROM Right eval Left eval  Shoulder flexion A:40 P:90 120  Shoulder extension    Shoulder abduction  Shoulder adduction    Shoulder extension    Shoulder internal rotation    Shoulder external rotation    Elbow flexion    Elbow extension    Wrist flexion    Wrist extension    Wrist ulnar deviation    Wrist radial deviation    Wrist  pronation    Wrist supination     (Blank rows = not tested)   UPPER EXTREMITY MMT:  MMT Right eval Left eval  Shoulder flexion 2 4  Shoulder extension    Shoulder abduction    Shoulder adduction    Shoulder extension    Shoulder internal rotation 3 4  Shoulder external rotation 3 4  Middle trapezius    Lower trapezius    Elbow flexion 3 4  Elbow extension 3 4  Wrist flexion    Wrist extension    Wrist ulnar deviation    Wrist radial deviation    Wrist pronation    Wrist supination    Grip strength 4 4   (Blank rows = not tested)   LUMBAR ROM:   Active  A/PROM  eval  Flexion 75%  Extension 10%  Right lateral flexion   Left lateral flexion   Right rotation 25%  Left rotation 25%   (Blank rows = not tested)   LOWER EXTREMITY MMT:    MMT in sitting Right eval Left eval  Hip flexion 3 3  Hip extension 3 3  Hip abduction 3 3  Hip adduction    Hip internal rotation    Hip external rotation    Knee flexion 3 3  Knee extension 3 3  Ankle dorsiflexion    Ankle plantarflexion    Ankle inversion    Ankle eversion     (Blank rows = not tested)     FUNCTIONAL TESTS:  11/17/23 5 times sit to stand: 43.03 seconds, must use UE support Timed up and go (TUG): 37 seconds, with SPC                                                                                                                              TREATMENT DATE:  Eval HEP creation and review with demonstration and trial set preformed, see below for details Nu step L3 X 6 min UE/LE with moist heat on back    PATIENT EDUCATION: Education details: HEP, PT plan of care, selfcare Person educated: Patient Education method: Explanation, Demonstration, Verbal cues, and Handouts Education comprehension: verbalized understanding, further education recommended   HOME EXERCISE PROGRAM: Access Code: [email or web address] URL: [email or web address] Date: 11/17/2023 Prepared by: Redell Moose  Exercises - Seated AAROM Shoulder Flexion  - 2 x daily - 6 x weekly - 3 sets - 10 reps - Standing 'L' Stretch at Counter  - 2 x daily - 6 x weekly - 1 sets - 10 reps - 5 hold - Standing March with Counter Support  - 2 x daily - 6 x  weekly - 1 sets - 10 reps - Proper Sit to Stand Technique  - 2 x daily - 6 x weekly - 1-2 sets - 5 reps - Supine Shoulder Flexion Extension AAROM with Dowel  - 2 x daily - 6 x weekly - 1 sets - 10 reps - 5 sec hold  ASSESSMENT:  CLINICAL IMPRESSION: Patient referred to PT for Physical deconditioning and other malaise. 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  She does have COPD, general weakness and poor endurance noted.  Patient will benefit from skilled PT to improve overall function and to address impairments and limitations listed below.  OBJECTIVE IMPAIRMENTS: decreased activity tolerance for ADL's, difficulty walking, decreased balance, decreased endurance, decreased mobility, decreased ROM, decreased strength, impaired flexibility, impaired UE/LE use, and pain.  ACTIVITY LIMITATIONS: bending, liftting, walking, standing, cleaning, community activity, reaching, carry,   PERSONAL FACTORS: see above PMH  also affecting patient's functional outcome.  REHAB POTENTIAL: Fair    CLINICAL DECISION MAKING: Evolving/moderate complexity  EVALUATION COMPLEXITY: Moderate    GOALS: Short term PT Goals Target date: 12/15/2023   Pt will be I and compliant with HEP. Baseline:  Goal status: New Pt will decrease pain by 25% overall Baseline: Goal status: New  Long term PT goals Target date:01/12/2024    Pt will improve TUG time to  less than 20 seconds to show improved endurance and balance Baseline: Goal status: New Pt will improve  strength to at least 4/5 MMT to improve functional strength Baseline: Goal status:  New Pt will improve Patient specific functional scale (PSFS) to at least 5/10 to show improved function level Baseline: Goal status: New Pt will reduce pain to overall less than 3/10 with usual activity and work activity. Baseline: Goal status: New Pt will improve 5 times sit to stand test to less than 25 seconds to show improved endurance and balance Baseline: Goal status: New  PLAN: PT FREQUENCY: 1-3 times per week   PT DURATION: 6-8 weeks  PLANNED INTERVENTIONS  97110-Therapeutic exercises, 97530- Therapeutic activity, 97112- Neuromuscular re-education, 97535- Self Care, 02859- Manual therapy, and [id number]- Electrical stimulation (unattended)  PLAN FOR NEXT SESSION: review HEP, work on general strength and conditioning, gait, right arm ROM NEXT MD VISIT: ?  Redell JONELLE Moose, PT,DPT 11/17/2023, 9:25 AM

## 2023-11-19 ENCOUNTER — Ambulatory Visit: Admitting: Physical Therapy

## 2023-11-19 ENCOUNTER — Encounter: Payer: Self-pay | Admitting: Physical Therapy

## 2023-11-19 ENCOUNTER — Telehealth: Payer: Self-pay | Admitting: Physical Therapy

## 2023-11-19 DIAGNOSIS — R2689 Other abnormalities of gait and mobility: Secondary | ICD-10-CM

## 2023-11-19 DIAGNOSIS — M5459 Other low back pain: Secondary | ICD-10-CM

## 2023-11-19 DIAGNOSIS — M6281 Muscle weakness (generalized): Secondary | ICD-10-CM

## 2023-11-19 NOTE — Therapy (Signed)
 OUTPATIENT PHYSICAL THERAPY TREATMENT    Patient Name: Tracy Johnston MRN: [id number] DOB:03/04/1950, 73 y.o., female Today's Date: 11/19/2023  END OF SESSION:  PT End of Session - 11/19/23 0920     Visit Number 2    Number of Visits 12    Date for Recertification  01/12/24    PT Start Time 0916    PT Stop Time 0955    PT Time Calculation (min) 39 min    Activity Tolerance Patient limited by fatigue;Patient limited by pain    Behavior During Therapy [facility] for tasks assessed/performed           Past Medical History:  Diagnosis Date   Arthritis    Asthma    Atrial fibrillation (HCC)    chronic. Was first diagnosed in her early 21s.   CAD (coronary artery disease) 09/06/2021   Cancer (HCC) 2003ish   , cervix   COPD (chronic obstructive pulmonary disease) (HCC)    Dysrhythmia    Heart murmur    Hypertension    Insomnia    Malignant neoplasm of kidney (HCC)    rt removed 2003   Mitral regurgitation    Mild to moderate. Normal EF 09/2010   Renal insufficiency    Sickle cell anemia (HCC)    Tobacco abuse    Past Surgical History:  Procedure Laterality Date   ABDOMINAL HYSTERECTOMY     arm fracture     rods, pins and bone graft.- to left arm from hip   BONE GRAFT HIP ILIAC CREST     to left arm   CERVICAL CONIZATION W/BX     NEPHRECTOMY  2003   TONSILLECTOMY     TOTAL HIP ARTHROPLASTY  10/17/2011   Procedure: TOTAL HIP ARTHROPLASTY;  Surgeon: Oneil JAYSON Herald, MD;  Location: [facility] OR;  Service: Orthopedics;  Laterality: Left;  Left Total Hip Arthroplasty   TOTAL HIP ARTHROPLASTY  01/02/2012   Procedure: TOTAL HIP ARTHROPLASTY;  Surgeon: Oneil JAYSON Herald, MD;  Location: [facility] OR;  Service: Orthopedics;  Laterality: Right;  Right Total Hip Arthroplasty   TOTAL KNEE ARTHROPLASTY Left 06/15/2020   Procedure: LEFT TOTAL KNEE ARTHROPLASTY;  Surgeon: Herald Oneil JAYSON, MD;  Location: [facility] OR;  Service: Orthopedics;  Laterality: Left;  Needs RNFA   TUBAL LIGATION     Patient Active Problem List    Diagnosis Date Noted   Impingement syndrome of right shoulder 12/10/2022   Calculus of gallbladder without cholecystitis without obstruction 11/27/2022   Cigarette smoker 05/05/2022   Nocturnal hypoxemia 02/11/2022   CAD (coronary artery disease) 09/06/2021   Centrilobular emphysema (HCC) 09/06/2021   Quadriceps weakness 10/18/2020   RUQ pain    Wheezing    S/P total knee arthroplasty, 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   Ventral hernia without obstruction or gangrene 04/18/2020   BMI 33.0-33.9,adult 08/12/2018   HLD (hyperlipidemia) 08/12/2018   LLQ abdominal pain 08/12/2018   Mixed stress and urge urinary incontinence 08/12/2018   History of bilateral hip arthroplasty 07/09/2017   Long-term current use of opiate analgesic 02/18/2017   On long term drug therapy 02/06/2017   Chronic low back pain 02/06/2017   Chronic pain syndrome 02/06/2017   Pain in left knee 11/22/2015   Incisional hernia, without obstruction or gangrene 09/09/2012   Acute blood loss anemia 10/20/2011    Class: Acute   COPD GOLD 0 /  CB 03/03/2011   Dyspnea 12/02/2010   Preop cardiovascular exam 10/31/2010   Claudication 10/18/2010   Atrial fibrillation (HCC)  Tobacco abuse    Hypertension     PCP: Tobie Guy, DO   REFERRING PROVIDER: Jacques Arlon PARAS, MD   REFERRING DIAG: Other malaise, Physical deconditioning.   Rationale for Evaluation and Treatment:  Rehabiliation  THERAPY DIAG:  Other abnormalities of gait and mobility  Muscle weakness (generalized)  Other low back pain  ONSET DATE: 3 month onset of worsening joint pain and stiffness, difficulty walking   SUBJECTIVE:                                                                                                                                                                                           SUBJECTIVE STATEMENT:  I need to change my appointments, they're in the mornings. Mornings are hard for me, my aide's car broke down and it took  me an hour and a half to get myself dressed this morning. Nothing new since last time, same thing.     EVAL: She relays 3 month onset of worsening joint pain and stiffness, difficulty walking. She says her right arm has something going on and she can not use it like she could before. Says she has to get help dressing and it takes her an hour. Says she is having trouble standing and walking, and balance. Her joints are all stiff and hurt all over, Relays chronic back pain. She denies N/T in her arms or legs  PERTINENT HISTORY:  See above PMH, emphysemia, bilat THA, TKA  PAIN:  NPRS scale: 8/10 upon arrival Pain location:back, all joints, right shoulder Pain description: constant, achy, burning Aggravating factors: the weather  Relieving factors: rest, meds, sitting    PRECAUTIONS: ,  None  RED FLAGS: None   WEIGHT BEARING RESTRICTIONS:  No  FALLS:  Has patient fallen in last 6 months? No  PLOF:  Needs assistance with homemaking  PATIENT GOALS:  Get more active, less stiff, less pain, stand and walk better  OBJECTIVE:  Note: Objective measures were completed at Evaluation unless otherwise noted.  PATIENT SURVEYS:  Patient-Specific Activity Scoring Scheme  0 represents "unable to perform." 10 represents "able to perform at prior level. 0 1 2 3 4 5 6 7 8 9  10 (Date and Score)   Activity Eval     1. Walking for 10 minutes 2     2. Dressing 3     3.     4.    5.    Score 2.5    Total score = sum of the activity scores/number of activities Minimum detectable change (90%CI) for average score = 2 points Minimum detectable change (90%CI) for single activity score =  3 points  POSTURE:  flexed trunk   GAIT: Assistive device utilized: Single point cane Level of assistance: Modified independence Comments: slow gait speed, gait unsteadiness without AD    UPPER EXTREMITY ROM:  Active ROM Right eval Left eval  Shoulder flexion A:40 P:90 120  Shoulder  extension    Shoulder abduction    Shoulder adduction    Shoulder extension    Shoulder internal rotation    Shoulder external rotation    Elbow flexion    Elbow extension    Wrist flexion    Wrist extension    Wrist ulnar deviation    Wrist radial deviation    Wrist pronation    Wrist supination     (Blank rows = not tested)   UPPER EXTREMITY MMT:  MMT Right eval Left eval  Shoulder flexion 2 4  Shoulder extension    Shoulder abduction    Shoulder adduction    Shoulder extension    Shoulder internal rotation 3 4  Shoulder external rotation 3 4  Middle trapezius    Lower trapezius    Elbow flexion 3 4  Elbow extension 3 4  Wrist flexion    Wrist extension    Wrist ulnar deviation    Wrist radial deviation    Wrist pronation    Wrist supination    Grip strength 4 4   (Blank rows = not tested)   LUMBAR ROM:   Active  A/PROM  eval  Flexion 75%  Extension 10%  Right lateral flexion   Left lateral flexion   Right rotation 25%  Left rotation 25%   (Blank rows = not tested)   LOWER EXTREMITY MMT:    MMT in sitting Right eval Left eval  Hip flexion 3 3  Hip extension 3 3  Hip abduction 3 3  Hip adduction    Hip internal rotation    Hip external rotation    Knee flexion 3 3  Knee extension 3 3  Ankle dorsiflexion    Ankle plantarflexion    Ankle inversion    Ankle eversion     (Blank rows = not tested)     FUNCTIONAL TESTS:  11/17/23 5 times sit to stand: 43.03 seconds, must use UE support Timed up and go (TUG): 37 seconds, with SPC                                                                                                                              TREATMENT DATE:   11/19/23  LAQs 3# 2x10 B Seated marches 3# 2x10 B Shoulder flexion slides table 2x10  Scapular retractions x10 Backward shoulder rolls x10  STS x7 limited by fatigue     Nustep L3x6 minutes seat 9, all four extremities  Attempted manual for shoulder ROM, she was  unable to tolerate this today due to sharp pains in shoulder when elbow was touched   Education on exercises performed today and benefit/goals  thereof, also educated on [facility] 15 minute late policy and encouraged her to call clinic if she is running late or has a conflict in the future     Eval HEP creation and review with demonstration and trial set preformed, see below for details Nu step L3 X 6 min UE/LE with moist heat on back    PATIENT EDUCATION: Education details: HEP, PT plan of care, selfcare Person educated: Patient Education method: Explanation, Demonstration, Verbal cues, and Handouts Education comprehension: verbalized understanding, further education recommended   HOME EXERCISE PROGRAM: Access Code: [email or web address] URL: [email or web address] Date: 11/17/2023 Prepared by: Redell Moose  Exercises - Seated AAROM Shoulder Flexion  - 2 x daily - 6 x weekly - 3 sets - 10 reps - Standing 'L' Stretch at Counter  - 2 x daily - 6 x weekly - 1 sets - 10 reps - 5 hold - Standing March with Counter Support  - 2 x daily - 6 x weekly - 1 sets - 10 reps - Proper Sit to Stand Technique  - 2 x daily - 6 x weekly - 1-2 sets - 5 reps - Supine Shoulder Flexion Extension AAROM with Dowel  - 2 x daily - 6 x weekly - 1 sets - 10 reps - 5 sec hold  ASSESSMENT:  CLINICAL IMPRESSION:   Patient arrived half an hour late today. We were able to shift our schedules a bit and accommodate for today, but I did educate on [facility] 15 minute late policy moving forward. Worked on functional strengthening as tolerated today, she becomes very fatigued and SOB very easily. Will continue to challenge her as able/tolerated. Unable to tolerate manual for shoulder ROM unfortunately.     EVAL:  Patient referred to PT for Physical deconditioning and other malaise. She has chronic back pain, general weakness, general joint stiffness, difficulty walking, and right shoulder impairments consistent with RTC  injury and adhesive capsulitis.  She does have COPD, general weakness and poor endurance noted.  Patient will benefit from skilled PT to improve overall function and to address impairments and limitations listed below.  OBJECTIVE IMPAIRMENTS: decreased activity tolerance for ADL's, difficulty walking, decreased balance, decreased endurance, decreased mobility, decreased ROM, decreased strength, impaired flexibility, impaired UE/LE use, and pain.  ACTIVITY LIMITATIONS: bending, liftting, walking, standing, cleaning, community activity, reaching, carry,   PERSONAL FACTORS: see above PMH  also affecting patient's functional outcome.  REHAB POTENTIAL: Fair    CLINICAL DECISION MAKING: Evolving/moderate complexity  EVALUATION COMPLEXITY: Moderate    GOALS: Short term PT Goals Target date: 12/15/2023   Pt will be I and compliant with HEP. Baseline:  Goal status: New Pt will decrease pain by 25% overall Baseline: Goal status: New  Long term PT goals Target date:01/12/2024    Pt will improve TUG time to  less than 20 seconds to show improved endurance and balance Baseline: Goal status: New Pt will improve  strength to at least 4/5 MMT to improve functional strength Baseline: Goal status: New Pt will improve Patient specific functional scale (PSFS) to at least 5/10 to show improved function level Baseline: Goal status: New Pt will reduce pain to overall less than 3/10 with usual activity and work activity. Baseline: Goal status: New Pt will improve 5 times sit to stand test to less than 25 seconds to show improved endurance and balance Baseline: Goal status: New  PLAN: PT FREQUENCY: 1-3 times per week   PT DURATION: 6-8 weeks  PLANNED INTERVENTIONS  97110-Therapeutic exercises, 97530- Therapeutic activity,  02887- Neuromuscular re-education, [phone]- Self Care, 02859- Manual therapy, and G0283- Electrical stimulation (unattended)  PLAN FOR NEXT SESSION: review HEP, work on  general strength and conditioning, gait, right arm ROM, balance   NEXT MD VISIT: ?  Josette Rough, PT, DPT 11/19/23 9:56 AM

## 2023-11-19 NOTE — Telephone Encounter (Signed)
 No-show for PT appt- attempted to call, line kept ringing with no answer and did not go to voicemail. Unable to reach her.  Josette Rough, PT, DPT 11/19/23 9:04 AM

## 2023-11-23 ENCOUNTER — Ambulatory Visit: Admitting: Physical Therapy

## 2023-11-23 ENCOUNTER — Encounter: Payer: Self-pay | Admitting: Physical Therapy

## 2023-11-23 DIAGNOSIS — M5459 Other low back pain: Secondary | ICD-10-CM

## 2023-11-23 DIAGNOSIS — R2689 Other abnormalities of gait and mobility: Secondary | ICD-10-CM | POA: Diagnosis not present

## 2023-11-23 DIAGNOSIS — M6281 Muscle weakness (generalized): Secondary | ICD-10-CM

## 2023-11-23 NOTE — Therapy (Signed)
 OUTPATIENT PHYSICAL THERAPY TREATMENT    Patient Name: Tracy Johnston MRN: [id number] DOB:12-Nov-1950, 73 y.o., female Today's Date: 11/23/2023  END OF SESSION:  PT End of Session - 11/23/23 0859     Visit Number 3    Number of Visits 12    Date for Recertification  01/12/24    PT Start Time 0853   pt late   PT Stop Time 0925    PT Time Calculation (min) 32 min    Activity Tolerance Patient limited by fatigue;Patient limited by pain    Behavior During Therapy [facility] for tasks assessed/performed            Past Medical History:  Diagnosis Date   Arthritis    Asthma    Atrial fibrillation (HCC)    chronic. Was first diagnosed in her early 82s.   CAD (coronary artery disease) 09/06/2021   Cancer (HCC) 2003ish   , cervix   COPD (chronic obstructive pulmonary disease) (HCC)    Dysrhythmia    Heart murmur    Hypertension    Insomnia    Malignant neoplasm of kidney (HCC)    rt removed 2003   Mitral regurgitation    Mild to moderate. Normal EF 09/2010   Renal insufficiency    Sickle cell anemia (HCC)    Tobacco abuse    Past Surgical History:  Procedure Laterality Date   ABDOMINAL HYSTERECTOMY     arm fracture     rods, pins and bone graft.- to left arm from hip   BONE GRAFT HIP ILIAC CREST     to left arm   CERVICAL CONIZATION W/BX     NEPHRECTOMY  2003   TONSILLECTOMY     TOTAL HIP ARTHROPLASTY  10/17/2011   Procedure: TOTAL HIP ARTHROPLASTY;  Surgeon: Oneil JAYSON Herald, MD;  Location: [facility] OR;  Service: Orthopedics;  Laterality: Left;  Left Total Hip Arthroplasty   TOTAL HIP ARTHROPLASTY  01/02/2012   Procedure: TOTAL HIP ARTHROPLASTY;  Surgeon: Oneil JAYSON Herald, MD;  Location: [facility] OR;  Service: Orthopedics;  Laterality: Right;  Right Total Hip Arthroplasty   TOTAL KNEE ARTHROPLASTY Left 06/15/2020   Procedure: LEFT TOTAL KNEE ARTHROPLASTY;  Surgeon: Herald Oneil JAYSON, MD;  Location: [facility] OR;  Service: Orthopedics;  Laterality: Left;  Needs RNFA   TUBAL LIGATION     Patient Active  Problem List   Diagnosis Date Noted   Impingement syndrome of right shoulder 12/10/2022   Calculus of gallbladder without cholecystitis without obstruction 11/27/2022   Cigarette smoker 05/05/2022   Nocturnal hypoxemia 02/11/2022   CAD (coronary artery disease) 09/06/2021   Centrilobular emphysema (HCC) 09/06/2021   Quadriceps weakness 10/18/2020   RUQ pain    Wheezing    S/P total knee arthroplasty, 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   Ventral hernia without obstruction or gangrene 04/18/2020   BMI 33.0-33.9,adult 08/12/2018   HLD (hyperlipidemia) 08/12/2018   LLQ abdominal pain 08/12/2018   Mixed stress and urge urinary incontinence 08/12/2018   History of bilateral hip arthroplasty 07/09/2017   Long-term current use of opiate analgesic 02/18/2017   On long term drug therapy 02/06/2017   Chronic low back pain 02/06/2017   Chronic pain syndrome 02/06/2017   Pain in left knee 11/22/2015   Incisional hernia, without obstruction or gangrene 09/09/2012   Acute blood loss anemia 10/20/2011    Class: Acute   COPD GOLD 0 /  CB 03/03/2011   Dyspnea 12/02/2010   Preop cardiovascular exam 10/31/2010   Claudication 10/18/2010   Atrial  fibrillation (HCC)    Tobacco abuse    Hypertension     PCP: Tobie Guy, DO   REFERRING PROVIDER: Jacques Arlon PARAS, MD   REFERRING DIAG: Other malaise, Physical deconditioning.   Rationale for Evaluation and Treatment:  Rehabiliation  THERAPY DIAG:  Other abnormalities of gait and mobility  Muscle weakness (generalized)  Other low back pain  ONSET DATE: 3 month onset of worsening joint pain and stiffness, difficulty walking   SUBJECTIVE:                                                                                                                                                                                           SUBJECTIVE STATEMENT:   Nothing new. Morning appointments are too hard for me to make, need to change them    EVAL: She relays 3  month onset of worsening joint pain and stiffness, difficulty walking. She says her right arm has something going on and she can not use it like she could before. Says she has to get help dressing and it takes her an hour. Says she is having trouble standing and walking, and balance. Her joints are all stiff and hurt all over, Relays chronic back pain. She denies N/T in her arms or legs  PERTINENT HISTORY:  See above PMH, emphysemia, bilat THA, TKA  PAIN:  NPRS scale: everywhere/10 Pain location:back, all joints, right shoulder Pain description: constant, achy, burning Aggravating factors: the weather, standing too long  Relieving factors: rest, meds, sitting, bending over    PRECAUTIONS: ,  None  RED FLAGS: None   WEIGHT BEARING RESTRICTIONS:  No  FALLS:  Has patient fallen in last 6 months? No  PLOF:  Needs assistance with homemaking  PATIENT GOALS:  Get more active, less stiff, less pain, stand and walk better  OBJECTIVE:  Note: Objective measures were completed at Evaluation unless otherwise noted.  PATIENT SURVEYS:  Patient-Specific Activity Scoring Scheme  0 represents "unable to perform." 10 represents "able to perform at prior level. 0 1 2 3 4 5 6 7 8 9  10 (Date and Score)   Activity Eval     1. Walking for 10 minutes 2     2. Dressing 3     3.     4.    5.    Score 2.5    Total score = sum of the activity scores/number of activities Minimum detectable change (90%CI) for average score = 2 points Minimum detectable change (90%CI) for single activity score = 3 points  POSTURE:  flexed trunk   GAIT: Assistive device utilized: Single point cane Level of assistance:  Modified independence Comments: slow gait speed, gait unsteadiness without AD    UPPER EXTREMITY ROM:  Active ROM Right eval Left eval  Shoulder flexion A:40 P:90 120  Shoulder extension    Shoulder abduction    Shoulder adduction    Shoulder extension    Shoulder  internal rotation    Shoulder external rotation    Elbow flexion    Elbow extension    Wrist flexion    Wrist extension    Wrist ulnar deviation    Wrist radial deviation    Wrist pronation    Wrist supination     (Blank rows = not tested)   UPPER EXTREMITY MMT:  MMT Right eval Left eval  Shoulder flexion 2 4  Shoulder extension    Shoulder abduction    Shoulder adduction    Shoulder extension    Shoulder internal rotation 3 4  Shoulder external rotation 3 4  Middle trapezius    Lower trapezius    Elbow flexion 3 4  Elbow extension 3 4  Wrist flexion    Wrist extension    Wrist ulnar deviation    Wrist radial deviation    Wrist pronation    Wrist supination    Grip strength 4 4   (Blank rows = not tested)   LUMBAR ROM:   Active  A/PROM  eval  Flexion 75%  Extension 10%  Right lateral flexion   Left lateral flexion   Right rotation 25%  Left rotation 25%   (Blank rows = not tested)   LOWER EXTREMITY MMT:    MMT in sitting Right eval Left eval  Hip flexion 3 3  Hip extension 3 3  Hip abduction 3 3  Hip adduction    Hip internal rotation    Hip external rotation    Knee flexion 3 3  Knee extension 3 3  Ankle dorsiflexion    Ankle plantarflexion    Ankle inversion    Ankle eversion     (Blank rows = not tested)     FUNCTIONAL TESTS:  11/17/23 5 times sit to stand: 43.03 seconds, must use UE support Timed up and go (TUG): 37 seconds, with SPC                                                                                                                              TREATMENT DATE:   11/23/23   Nustep L3x8 minutes all four extremities seat 9  LAQs 4# 2x10 B STS from high mat table 4#  in goblet hold x10 Scap retractions 15x3 seconds Backward shouler rolls x20 Mini-squats holding onto the back of bike x10 (self selected depth) Pillow case door slides for shoulder elevation x6     11/19/23  LAQs 3# 2x10 B Seated marches 3# 2x10  B Shoulder flexion slides table 2x10  Scapular retractions x10 Backward shoulder rolls x10  STS x7 limited by fatigue     Nustep  L3x6 minutes seat 9, all four extremities  Attempted manual for shoulder ROM, she was unable to tolerate this today due to sharp pains in shoulder when elbow was touched   Education on exercises performed today and benefit/goals thereof, also educated on [facility] 15 minute late policy and encouraged her to call clinic if she is running late or has a conflict in the future     Eval HEP creation and review with demonstration and trial set preformed, see below for details Nu step L3 X 6 min UE/LE with moist heat on back    PATIENT EDUCATION: Education details: HEP, PT plan of care, selfcare Person educated: Patient Education method: Explanation, Demonstration, Verbal cues, and Handouts Education comprehension: verbalized understanding, further education recommended   HOME EXERCISE PROGRAM: Access Code: [email or web address] URL: [email or web address] Date: 11/17/2023 Prepared by: Redell Moose  Exercises - Seated AAROM Shoulder Flexion  - 2 x daily - 6 x weekly - 3 sets - 10 reps - Standing 'L' Stretch at Counter  - 2 x daily - 6 x weekly - 1 sets - 10 reps - 5 hold - Standing March with Counter Support  - 2 x daily - 6 x weekly - 1 sets - 10 reps - Proper Sit to Stand Technique  - 2 x daily - 6 x weekly - 1-2 sets - 5 reps - Supine Shoulder Flexion Extension AAROM with Dowel  - 2 x daily - 6 x weekly - 1 sets - 10 reps - 5 sec hold  ASSESSMENT:  CLINICAL IMPRESSION:  She arrived late today, we progressed all interventions as time allowed. Will continue efforts in context of multiple chronic medical conditions and gross deconditioning.      EVAL:  Patient referred to PT for Physical deconditioning and other malaise. She has chronic back pain, general weakness, general joint stiffness, difficulty walking, and right shoulder impairments consistent  with RTC injury and adhesive capsulitis.  She does have COPD, general weakness and poor endurance noted.  Patient will benefit from skilled PT to improve overall function and to address impairments and limitations listed below.  OBJECTIVE IMPAIRMENTS: decreased activity tolerance for ADL's, difficulty walking, decreased balance, decreased endurance, decreased mobility, decreased ROM, decreased strength, impaired flexibility, impaired UE/LE use, and pain.  ACTIVITY LIMITATIONS: bending, liftting, walking, standing, cleaning, community activity, reaching, carry,   PERSONAL FACTORS: see above PMH  also affecting patient's functional outcome.  REHAB POTENTIAL: Fair    CLINICAL DECISION MAKING: Evolving/moderate complexity  EVALUATION COMPLEXITY: Moderate    GOALS: Short term PT Goals Target date: 12/15/2023   Pt will be I and compliant with HEP. Baseline:  Goal status: New Pt will decrease pain by 25% overall Baseline: Goal status: New  Long term PT goals Target date:01/12/2024    Pt will improve TUG time to  less than 20 seconds to show improved endurance and balance Baseline: Goal status: New Pt will improve  strength to at least 4/5 MMT to improve functional strength Baseline: Goal status: New Pt will improve Patient specific functional scale (PSFS) to at least 5/10 to show improved function level Baseline: Goal status: New Pt will reduce pain to overall less than 3/10 with usual activity and work activity. Baseline: Goal status: New Pt will improve 5 times sit to stand test to less than 25 seconds to show improved endurance and balance Baseline: Goal status: New  PLAN: PT FREQUENCY: 1-3 times per week   PT DURATION: 6-8 weeks  PLANNED INTERVENTIONS  97110-Therapeutic exercises, 97530- Therapeutic  activity, [id number]- Neuromuscular re-education, [phone]- Self Care, 02859- Manual therapy, and G0283- Electrical stimulation (unattended)  PLAN FOR NEXT SESSION: review HEP,  work on general strength and conditioning, gait, right arm ROM as able, balance   NEXT MD VISIT: ?  Josette Rough, PT, DPT 11/23/23 9:26 AM

## 2023-11-26 ENCOUNTER — Other Ambulatory Visit: Payer: Self-pay | Admitting: Internal Medicine

## 2023-11-26 ENCOUNTER — Encounter: Payer: Self-pay | Admitting: Physical Therapy

## 2023-11-26 ENCOUNTER — Ambulatory Visit: Admitting: Physical Therapy

## 2023-11-26 DIAGNOSIS — R2689 Other abnormalities of gait and mobility: Secondary | ICD-10-CM | POA: Diagnosis not present

## 2023-11-26 DIAGNOSIS — M5459 Other low back pain: Secondary | ICD-10-CM

## 2023-11-26 DIAGNOSIS — M6281 Muscle weakness (generalized): Secondary | ICD-10-CM

## 2023-11-26 NOTE — Therapy (Signed)
 OUTPATIENT PHYSICAL THERAPY TREATMENT    Patient Name: Tracy Johnston MRN: [id number] DOB:[birth year], 73 y.o., female Today's Date: 11/26/2023  END OF SESSION:  PT End of Session - 11/26/23 0856     Visit Number 4    Number of Visits 12    Date for Recertification  01/12/24    PT Start Time 0847    PT Stop Time 0925    PT Time Calculation (min) 38 min    Activity Tolerance Patient limited by fatigue;Patient limited by pain    Behavior During Therapy [facility] for tasks assessed/performed            Past Medical History:  Diagnosis Date   Arthritis    Asthma    Atrial fibrillation (HCC)    chronic. Was first diagnosed in her early 59s.   CAD (coronary artery disease) 09/06/2021   Cancer (HCC) 2003ish   , cervix   COPD (chronic obstructive pulmonary disease) (HCC)    Dysrhythmia    Heart murmur    Hypertension    Insomnia    Malignant neoplasm of kidney (HCC)    rt removed 2003   Mitral regurgitation    Mild to moderate. Normal EF 09/2010   Renal insufficiency    Sickle cell anemia (HCC)    Tobacco abuse    Past Surgical History:  Procedure Laterality Date   ABDOMINAL HYSTERECTOMY     arm fracture     rods, pins and bone graft.- to left arm from hip   BONE GRAFT HIP ILIAC CREST     to left arm   CERVICAL CONIZATION W/BX     NEPHRECTOMY  2003   TONSILLECTOMY     TOTAL HIP ARTHROPLASTY  10/17/2011   Procedure: TOTAL HIP ARTHROPLASTY;  Surgeon: Oneil JAYSON Herald, MD;  Location: [facility] OR;  Service: Orthopedics;  Laterality: Left;  Left Total Hip Arthroplasty   TOTAL HIP ARTHROPLASTY  01/02/2012   Procedure: TOTAL HIP ARTHROPLASTY;  Surgeon: Oneil JAYSON Herald, MD;  Location: [facility] OR;  Service: Orthopedics;  Laterality: Right;  Right Total Hip Arthroplasty   TOTAL KNEE ARTHROPLASTY Left 06/15/2020   Procedure: LEFT TOTAL KNEE ARTHROPLASTY;  Surgeon: Herald Oneil JAYSON, MD;  Location: [facility] OR;  Service: Orthopedics;  Laterality: Left;  Needs RNFA   TUBAL LIGATION     Patient Active Problem  List   Diagnosis Date Noted   Impingement syndrome of right shoulder 12/10/2022   Calculus of gallbladder without cholecystitis without obstruction 11/27/2022   Cigarette smoker 05/05/2022   Nocturnal hypoxemia 02/11/2022   CAD (coronary artery disease) 09/06/2021   Centrilobular emphysema (HCC) 09/06/2021   Quadriceps weakness 10/18/2020   RUQ pain    Wheezing    S/P total knee arthroplasty, 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   Ventral hernia without obstruction or gangrene 04/18/2020   BMI 33.0-33.9,adult 08/12/2018   HLD (hyperlipidemia) 08/12/2018   LLQ abdominal pain 08/12/2018   Mixed stress and urge urinary incontinence 08/12/2018   History of bilateral hip arthroplasty 07/09/2017   Long-term current use of opiate analgesic 02/18/2017   On long term drug therapy 02/06/2017   Chronic low back pain 02/06/2017   Chronic pain syndrome 02/06/2017   Pain in left knee 11/22/2015   Incisional hernia, without obstruction or gangrene 09/09/2012   Acute blood loss anemia 10/20/2011    Class: Acute   COPD GOLD 0 /  CB 03/03/2011   Dyspnea 12/02/2010   Preop cardiovascular exam 10/31/2010   Claudication 10/18/2010   Atrial fibrillation (HCC)  Tobacco abuse    Hypertension     PCP: Tobie Guy, DO   REFERRING PROVIDER: Jacques Arlon PARAS, MD   REFERRING DIAG: Other malaise, Physical deconditioning.   Rationale for Evaluation and Treatment:  Rehabiliation  THERAPY DIAG:  Other abnormalities of gait and mobility  Muscle weakness (generalized)  Other low back pain  ONSET DATE: 3 month onset of worsening joint pain and stiffness, difficulty walking   SUBJECTIVE:                                                                                                                                                                                           SUBJECTIVE STATEMENT:Pain is terrible today, she does not like morning appointments.   EVAL: She relays 3 month onset of worsening joint pain  and stiffness, difficulty walking. She says her right arm has something going on and she can not use it like she could before. Says she has to get help dressing and it takes her an hour. Says she is having trouble standing and walking, and balance. Her joints are all stiff and hurt all over, Relays chronic back pain. She denies N/T in her arms or legs  PERTINENT HISTORY:  See above PMH, emphysemia, bilat THA, TKA  PAIN:  NPRS scale: everywhere/10 Pain location:back, all joints, right shoulder Pain description: constant, achy, burning Aggravating factors: the weather, standing too long  Relieving factors: rest, meds, sitting, bending over    PRECAUTIONS: ,  None  RED FLAGS: None   WEIGHT BEARING RESTRICTIONS:  No  FALLS:  Has patient fallen in last 6 months? No  PLOF:  Needs assistance with homemaking  PATIENT GOALS:  Get more active, less stiff, less pain, stand and walk better  OBJECTIVE:  Note: Objective measures were completed at Evaluation unless otherwise noted.  PATIENT SURVEYS:  Patient-Specific Activity Scoring Scheme  0 represents "unable to perform." 10 represents "able to perform at prior level. 0 1 2 3 4 5 6 7 8 9  10 (Date and Score)   Activity Eval     1. Walking for 10 minutes 2     2. Dressing 3     3.     4.    5.    Score 2.5    Total score = sum of the activity scores/number of activities Minimum detectable change (90%CI) for average score = 2 points Minimum detectable change (90%CI) for single activity score = 3 points  POSTURE:  flexed trunk   GAIT: Assistive device utilized: Single point cane Level of assistance: Modified independence Comments: slow gait speed, gait unsteadiness without AD    UPPER  EXTREMITY ROM:  Active ROM Right eval Left eval  Shoulder flexion A:40 P:90 120  Shoulder extension    Shoulder abduction    Shoulder adduction    Shoulder extension    Shoulder internal rotation    Shoulder external  rotation    Elbow flexion    Elbow extension    Wrist flexion    Wrist extension    Wrist ulnar deviation    Wrist radial deviation    Wrist pronation    Wrist supination     (Blank rows = not tested)   UPPER EXTREMITY MMT:  MMT Right eval Left eval  Shoulder flexion 2 4  Shoulder extension    Shoulder abduction    Shoulder adduction    Shoulder extension    Shoulder internal rotation 3 4  Shoulder external rotation 3 4  Middle trapezius    Lower trapezius    Elbow flexion 3 4  Elbow extension 3 4  Wrist flexion    Wrist extension    Wrist ulnar deviation    Wrist radial deviation    Wrist pronation    Wrist supination    Grip strength 4 4   (Blank rows = not tested)   LUMBAR ROM:   Active  A/PROM  eval  Flexion 75%  Extension 10%  Right lateral flexion   Left lateral flexion   Right rotation 25%  Left rotation 25%   (Blank rows = not tested)   LOWER EXTREMITY MMT:    MMT in sitting Right eval Left eval  Hip flexion 3 3  Hip extension 3 3  Hip abduction 3 3  Hip adduction    Hip internal rotation    Hip external rotation    Knee flexion 3 3  Knee extension 3 3  Ankle dorsiflexion    Ankle plantarflexion    Ankle inversion    Ankle eversion     (Blank rows = not tested)     FUNCTIONAL TESTS:  11/17/23 5 times sit to stand: 43.03 seconds, must use UE support Timed up and go (TUG): 37 seconds, with SPC                                                                                                                              TREATMENT DATE:  11/26/23 Nustep L3x10 minutes all four extremities seat 8 LAQs 4# 2x10 B Seated chest press alternating Ue's, X 10 each with red Seated hamstring curls red 2 X 10 bilat Seated rows red X 10 bilat Sit to stands 2X5 reps from Nu step seat, no UE support Backward shouler rolls x10 Sidestepping in bars 3 round trips Marchwalking in bars 3 round trips UBE L1 X 3 min  11/23/23   Nustep L3x8  minutes all four extremities seat 9  LAQs 4# 2x10 B STS from high mat table 4#  in goblet hold x10 Scap retractions 15x3 seconds Backward shouler rolls x20 Mini-squats holding  onto the back of bike x10 (self selected depth) Pillow case door slides for shoulder elevation x6     11/19/23  LAQs 3# 2x10 B Seated marches 3# 2x10 B Shoulder flexion slides table 2x10  Scapular retractions x10 Backward shoulder rolls x10  STS x7 limited by fatigue     Nustep L3x6 minutes seat 9, all four extremities  Attempted manual for shoulder ROM, she was unable to tolerate this today due to sharp pains in shoulder when elbow was touched   Education on exercises performed today and benefit/goals thereof, also educated on [facility] 15 minute late policy and encouraged her to call clinic if she is running late or has a conflict in the future     PATIENT EDUCATION: Education details: HEP, PT plan of care, selfcare Person educated: Patient Education method: Explanation, Demonstration, Verbal cues, and Handouts Education comprehension: verbalized understanding, further education recommended   HOME EXERCISE PROGRAM: Access Code: [email or web address] URL: [email or web address] Date: 11/17/2023 Prepared by: Redell Moose  Exercises - Seated AAROM Shoulder Flexion  - 2 x daily - 6 x weekly - 3 sets - 10 reps - Standing 'L' Stretch at Counter  - 2 x daily - 6 x weekly - 1 sets - 10 reps - 5 hold - Standing March with Counter Support  - 2 x daily - 6 x weekly - 1 sets - 10 reps - Proper Sit to Stand Technique  - 2 x daily - 6 x weekly - 1-2 sets - 5 reps - Supine Shoulder Flexion Extension AAROM with Dowel  - 2 x daily - 6 x weekly - 1 sets - 10 reps - 5 sec hold  ASSESSMENT:  CLINICAL IMPRESSION: I tried to alternate lower body exercises with upper body exercises to improve efficiency of her strength/conditioning program. Better tolerance noted today for exercise but does have the most difficulty out  of her right arm and left knee.   EVAL:  Patient referred to PT for Physical deconditioning and other malaise. 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  She does have COPD, general weakness and poor endurance noted.  Patient will benefit from skilled PT to improve overall function and to address impairments and limitations listed below.  OBJECTIVE IMPAIRMENTS: decreased activity tolerance for ADL's, difficulty walking, decreased balance, decreased endurance, decreased mobility, decreased ROM, decreased strength, impaired flexibility, impaired UE/LE use, and pain.  ACTIVITY LIMITATIONS: bending, liftting, walking, standing, cleaning, community activity, reaching, carry,   PERSONAL FACTORS: see above PMH  also affecting patient's functional outcome.  REHAB POTENTIAL: Fair    CLINICAL DECISION MAKING: Evolving/moderate complexity  EVALUATION COMPLEXITY: Moderate    GOALS: Short term PT Goals Target date: 12/15/2023   Pt will be I and compliant with HEP. Baseline:  Goal status: New Pt will decrease pain by 25% overall Baseline: Goal status: New  Long term PT goals Target date:01/12/2024    Pt will improve TUG time to  less than 20 seconds to show improved endurance and balance Baseline: Goal status: New Pt will improve  strength to at least 4/5 MMT to improve functional strength Baseline: Goal status: New Pt will improve Patient specific functional scale (PSFS) to at least 5/10 to show improved function level Baseline: Goal status: New Pt will reduce pain to overall less than 3/10 with usual activity and work activity. Baseline: Goal status: New Pt will improve 5 times sit to stand test to  less than 25 seconds to show improved endurance and balance Baseline: Goal status: New  PLAN: PT FREQUENCY: 1-3 times per week   PT DURATION: 6-8  weeks  PLANNED INTERVENTIONS  97110-Therapeutic exercises, 97530- Therapeutic activity, [id number]- Neuromuscular re-education, 97535- Self Care, 02859- Manual therapy, and G0283- Electrical stimulation (unattended)  PLAN FOR NEXT SESSION: work on general strength and conditioning, gait, right arm ROM as able, balance   NEXT MD VISIT: December  Redell Moose, PT, DPT 11/26/23 9:29 AM

## 2023-11-30 ENCOUNTER — Encounter: Payer: Self-pay | Admitting: Radiology

## 2023-12-01 ENCOUNTER — Ambulatory Visit: Attending: Family Medicine | Admitting: Physical Therapy

## 2023-12-01 ENCOUNTER — Encounter: Payer: Self-pay | Admitting: Physical Therapy

## 2023-12-01 DIAGNOSIS — R2689 Other abnormalities of gait and mobility: Secondary | ICD-10-CM | POA: Diagnosis present

## 2023-12-01 DIAGNOSIS — M5459 Other low back pain: Secondary | ICD-10-CM | POA: Insufficient documentation

## 2023-12-01 DIAGNOSIS — M6281 Muscle weakness (generalized): Secondary | ICD-10-CM | POA: Insufficient documentation

## 2023-12-01 NOTE — Therapy (Signed)
 OUTPATIENT PHYSICAL THERAPY TREATMENT    Patient Name: Tracy Johnston MRN: [id number] DOB:July 23, 1950, 73 y.o., female Today's Date: 12/01/2023  END OF SESSION:  PT End of Session - 12/01/23 0839     Visit Number 5    Number of Visits 12    Date for Recertification  01/12/24    PT Start Time 0802    PT Stop Time 0842    PT Time Calculation (min) 40 min    Activity Tolerance Patient limited by fatigue    Behavior During Therapy [facility] for tasks assessed/performed             Past Medical History:  Diagnosis Date   Arthritis    Asthma    Atrial fibrillation (HCC)    chronic. Was first diagnosed in her early 22s.   CAD (coronary artery disease) 09/06/2021   Cancer (HCC) 2003ish   , cervix   COPD (chronic obstructive pulmonary disease) (HCC)    Dysrhythmia    Heart murmur    Hypertension    Insomnia    Malignant neoplasm of kidney (HCC)    rt removed 2003   Mitral regurgitation    Mild to moderate. Normal EF 09/2010   Renal insufficiency    Sickle cell anemia (HCC)    Tobacco abuse    Past Surgical History:  Procedure Laterality Date   ABDOMINAL HYSTERECTOMY     arm fracture     rods, pins and bone graft.- to left arm from hip   BONE GRAFT HIP ILIAC CREST     to left arm   CERVICAL CONIZATION W/BX     NEPHRECTOMY  2003   TONSILLECTOMY     TOTAL HIP ARTHROPLASTY  10/17/2011   Procedure: TOTAL HIP ARTHROPLASTY;  Surgeon: Oneil JAYSON Herald, MD;  Location: [facility] OR;  Service: Orthopedics;  Laterality: Left;  Left Total Hip Arthroplasty   TOTAL HIP ARTHROPLASTY  01/02/2012   Procedure: TOTAL HIP ARTHROPLASTY;  Surgeon: Oneil JAYSON Herald, MD;  Location: [facility] OR;  Service: Orthopedics;  Laterality: Right;  Right Total Hip Arthroplasty   TOTAL KNEE ARTHROPLASTY Left 06/15/2020   Procedure: LEFT TOTAL KNEE ARTHROPLASTY;  Surgeon: Herald Oneil JAYSON, MD;  Location: [facility] OR;  Service: Orthopedics;  Laterality: Left;  Needs RNFA   TUBAL LIGATION     Patient Active Problem List   Diagnosis Date  Noted   Impingement syndrome of right shoulder 12/10/2022   Calculus of gallbladder without cholecystitis without obstruction 11/27/2022   Cigarette smoker 05/05/2022   Nocturnal hypoxemia 02/11/2022   CAD (coronary artery disease) 09/06/2021   Centrilobular emphysema (HCC) 09/06/2021   Quadriceps weakness 10/18/2020   RUQ pain    Wheezing    S/P total knee arthroplasty, 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   Ventral hernia without obstruction or gangrene 04/18/2020   BMI 33.0-33.9,adult 08/12/2018   HLD (hyperlipidemia) 08/12/2018   LLQ abdominal pain 08/12/2018   Mixed stress and urge urinary incontinence 08/12/2018   History of bilateral hip arthroplasty 07/09/2017   Long-term current use of opiate analgesic 02/18/2017   On long term drug therapy 02/06/2017   Chronic low back pain 02/06/2017   Chronic pain syndrome 02/06/2017   Pain in left knee 11/22/2015   Incisional hernia, without obstruction or gangrene 09/09/2012   Acute blood loss anemia 10/20/2011    Class: Acute   COPD GOLD 0 /  CB 03/03/2011   Dyspnea 12/02/2010   Preop cardiovascular exam 10/31/2010   Claudication 10/18/2010   Atrial fibrillation (HCC)  Tobacco abuse    Hypertension     PCP: Tobie Guy, DO   REFERRING PROVIDER: Jacques Arlon PARAS, MD   REFERRING DIAG: Other malaise, Physical deconditioning.   Rationale for Evaluation and Treatment:  Rehabiliation  THERAPY DIAG:  Other abnormalities of gait and mobility  Muscle weakness (generalized)  Other low back pain  ONSET DATE: 3 month onset of worsening joint pain and stiffness, difficulty walking   SUBJECTIVE:                                                                                                                                                                                           SUBJECTIVE STATEMENT:Pain is better today, feels her joints are moving better since starting PT   EVAL: She relays 3 month onset of worsening joint pain and  stiffness, difficulty walking. She says her right arm has something going on and she can not use it like she could before. Says she has to get help dressing and it takes her an hour. Says she is having trouble standing and walking, and balance. Her joints are all stiff and hurt all over, Relays chronic back pain. She denies N/T in her arms or legs  PERTINENT HISTORY:  See above PMH, emphysemia, bilat THA, TKA  PAIN:  NPRS scale: 6/10 Pain location:back, all joints, right shoulder Pain description: constant, achy, burning Aggravating factors: the weather, standing too long  Relieving factors: rest, meds, sitting, bending over    PRECAUTIONS: ,  None  RED FLAGS: None   WEIGHT BEARING RESTRICTIONS:  No  FALLS:  Has patient fallen in last 6 months? No  PLOF:  Needs assistance with homemaking  PATIENT GOALS:  Get more active, less stiff, less pain, stand and walk better  OBJECTIVE:  Note: Objective measures were completed at Evaluation unless otherwise noted.  PATIENT SURVEYS:  Patient-Specific Activity Scoring Scheme  0 represents "unable to perform." 10 represents "able to perform at prior level. 0 1 2 3 4 5 6 7 8 9  10 (Date and Score)   Activity Eval     1. Walking for 10 minutes 2     2. Dressing 3     3.     4.    5.    Score 2.5    Total score = sum of the activity scores/number of activities Minimum detectable change (90%CI) for average score = 2 points Minimum detectable change (90%CI) for single activity score = 3 points  POSTURE:  flexed trunk   GAIT: Assistive device utilized: Single point cane Level of assistance: Modified independence Comments: slow gait speed, gait unsteadiness without AD  UPPER EXTREMITY ROM:  Active ROM Right eval Left eval  Shoulder flexion A:40 P:90 120  Shoulder extension    Shoulder abduction    Shoulder adduction    Shoulder extension    Shoulder internal rotation    Shoulder external rotation     Elbow flexion    Elbow extension    Wrist flexion    Wrist extension    Wrist ulnar deviation    Wrist radial deviation    Wrist pronation    Wrist supination     (Blank rows = not tested)   UPPER EXTREMITY MMT:  MMT Right eval Left eval  Shoulder flexion 2 4  Shoulder extension    Shoulder abduction    Shoulder adduction    Shoulder extension    Shoulder internal rotation 3 4  Shoulder external rotation 3 4  Middle trapezius    Lower trapezius    Elbow flexion 3 4  Elbow extension 3 4  Wrist flexion    Wrist extension    Wrist ulnar deviation    Wrist radial deviation    Wrist pronation    Wrist supination    Grip strength 4 4   (Blank rows = not tested)   LUMBAR ROM:   Active  A/PROM  eval  Flexion 75%  Extension 10%  Right lateral flexion   Left lateral flexion   Right rotation 25%  Left rotation 25%   (Blank rows = not tested)   LOWER EXTREMITY MMT:    MMT in sitting Right eval Left eval  Hip flexion 3 3  Hip extension 3 3  Hip abduction 3 3  Hip adduction    Hip internal rotation    Hip external rotation    Knee flexion 3 3  Knee extension 3 3  Ankle dorsiflexion    Ankle plantarflexion    Ankle inversion    Ankle eversion     (Blank rows = not tested)     FUNCTIONAL TESTS:  11/17/23 5 times sit to stand: 43.03 seconds, must use UE support Timed up and go (TUG): 37 seconds, with SPC                                                                                                                              TREATMENT DATE:  12/01/23 Nustep L4x10 minutes all four extremities seat 8 LAQs 4# 2x10 B Seated chest press alternating Ue's, X 12 each with red Seated hamstring curls red 2 X 10 bilat Standing rows red X 15 bilat Sit to stands 2X5 reps from Nu step seat, no UE support Backward shouler rolls x15 Sidestepping in bars 3 round trips Marchwalking in bars 3 round trips UBE L1 X 4 min, switch directions half  way  11/26/23 Nustep L3x10 minutes all four extremities seat 8 LAQs 4# 2x10 B Seated chest press alternating Ue's, X 10 each with red Seated hamstring curls red 2 X 10 bilat Seated  rows red X 10 bilat Sit to stands 2X5 reps from Nu step seat, no UE support Backward shouler rolls x10 Sidestepping in bars 3 round trips Marchwalking in bars 3 round trips UBE L1 X 3 min  11/23/23   Nustep L3x8 minutes all four extremities seat 9  LAQs 4# 2x10 B STS from high mat table 4#  in goblet hold x10 Scap retractions 15x3 seconds Backward shouler rolls x20 Mini-squats holding onto the back of bike x10 (self selected depth) Pillow case door slides for shoulder elevation x6   PATIENT EDUCATION: Education details: HEP, PT plan of care, selfcare Person educated: Patient Education method: Explanation, Demonstration, Verbal cues, and Handouts Education comprehension: verbalized understanding, further education recommended   HOME EXERCISE PROGRAM: Access Code: [email or web address] URL: [email or web address] Date: 11/17/2023 Prepared by: Redell Moose  Exercises - Seated AAROM Shoulder Flexion  - 2 x daily - 6 x weekly - 3 sets - 10 reps - Standing 'L' Stretch at Counter  - 2 x daily - 6 x weekly - 1 sets - 10 reps - 5 hold - Standing March with Counter Support  - 2 x daily - 6 x weekly - 1 sets - 10 reps - Proper Sit to Stand Technique  - 2 x daily - 6 x weekly - 1-2 sets - 5 reps - Supine Shoulder Flexion Extension AAROM with Dowel  - 2 x daily - 6 x weekly - 1 sets - 10 reps - 5 sec hold  ASSESSMENT:  CLINICAL IMPRESSION: She was feeling better today so progressed her program some with good overall tolerance. Standing tolerance remains limited and she will benefit from continued work on this to improve function.    EVAL:  Patient referred to PT for Physical deconditioning and other malaise. She has chronic back pain, general weakness, general joint stiffness, difficulty walking, and  right shoulder impairments consistent with RTC injury and adhesive capsulitis.  She does have COPD, general weakness and poor endurance noted.  Patient will benefit from skilled PT to improve overall function and to address impairments and limitations listed below.  OBJECTIVE IMPAIRMENTS: decreased activity tolerance for ADL's, difficulty walking, decreased balance, decreased endurance, decreased mobility, decreased ROM, decreased strength, impaired flexibility, impaired UE/LE use, and pain.  ACTIVITY LIMITATIONS: bending, liftting, walking, standing, cleaning, community activity, reaching, carry,   PERSONAL FACTORS: see above PMH  also affecting patient's functional outcome.  REHAB POTENTIAL: Fair    CLINICAL DECISION MAKING: Evolving/moderate complexity  EVALUATION COMPLEXITY: Moderate    GOALS: Short term PT Goals Target date: 12/15/2023   Pt will be I and compliant with HEP. Baseline:  Goal status: New Pt will decrease pain by 25% overall Baseline: Goal status: New  Long term PT goals Target date:01/12/2024    Pt will improve TUG time to  less than 20 seconds to show improved endurance and balance Baseline: Goal status: New Pt will improve  strength to at least 4/5 MMT to improve functional strength Baseline: Goal status: New Pt will improve Patient specific functional scale (PSFS) to at least 5/10 to show improved function level Baseline: Goal status: New Pt will reduce pain to overall less than 3/10 with usual activity and work activity. Baseline: Goal status: New Pt will improve 5 times sit to stand test to less than 25 seconds to show improved endurance and balance Baseline: Goal status: New  PLAN: PT FREQUENCY: 1-3 times per week   PT DURATION: 6-8 weeks  PLANNED INTERVENTIONS  97110-Therapeutic exercises, 97530- Therapeutic activity, 97112-  Neuromuscular re-education, [phone]- Self Care, 02859- Manual therapy, and G0283- Electrical stimulation  (unattended)  PLAN FOR NEXT SESSION: work on general strength and conditioning, gait, right arm ROM as able, balance   NEXT MD VISIT: December  Redell Moose, PT, DPT 12/01/23 8:39 AM

## 2023-12-04 ENCOUNTER — Other Ambulatory Visit: Payer: Self-pay | Admitting: Internal Medicine

## 2023-12-07 ENCOUNTER — Ambulatory Visit: Admitting: Physical Therapy

## 2023-12-09 ENCOUNTER — Ambulatory Visit: Admitting: Physical Therapy

## 2023-12-09 ENCOUNTER — Encounter: Payer: Self-pay | Admitting: Physical Therapy

## 2023-12-09 DIAGNOSIS — R2689 Other abnormalities of gait and mobility: Secondary | ICD-10-CM | POA: Diagnosis not present

## 2023-12-09 DIAGNOSIS — M5459 Other low back pain: Secondary | ICD-10-CM

## 2023-12-09 DIAGNOSIS — M6281 Muscle weakness (generalized): Secondary | ICD-10-CM

## 2023-12-09 NOTE — Therapy (Signed)
 OUTPATIENT PHYSICAL THERAPY TREATMENT    Patient Name: Tracy Johnston MRN: [id number] DOB:03-20-1950, 73 y.o., female Today's Date: 12/09/2023  END OF SESSION:  PT End of Session - 12/09/23 0902     Visit Number 6    Number of Visits 12    Date for Recertification  01/12/24    PT Start Time 0855    PT Stop Time 0933    PT Time Calculation (min) 38 min    Activity Tolerance Patient limited by fatigue    Behavior During Therapy [facility] for tasks assessed/performed             Past Medical History:  Diagnosis Date   Arthritis    Asthma    Atrial fibrillation (HCC)    chronic. Was first diagnosed in her early 86s.   CAD (coronary artery disease) 09/06/2021   Cancer (HCC) 2003ish   , cervix   COPD (chronic obstructive pulmonary disease) (HCC)    Dysrhythmia    Heart murmur    Hypertension    Insomnia    Malignant neoplasm of kidney (HCC)    rt removed 2003   Mitral regurgitation    Mild to moderate. Normal EF 09/2010   Renal insufficiency    Sickle cell anemia (HCC)    Tobacco abuse    Past Surgical History:  Procedure Laterality Date   ABDOMINAL HYSTERECTOMY     arm fracture     rods, pins and bone graft.- to left arm from hip   BONE GRAFT HIP ILIAC CREST     to left arm   CERVICAL CONIZATION W/BX     NEPHRECTOMY  2003   TONSILLECTOMY     TOTAL HIP ARTHROPLASTY  10/17/2011   Procedure: TOTAL HIP ARTHROPLASTY;  Surgeon: Oneil JAYSON Herald, MD;  Location: [facility] OR;  Service: Orthopedics;  Laterality: Left;  Left Total Hip Arthroplasty   TOTAL HIP ARTHROPLASTY  01/02/2012   Procedure: TOTAL HIP ARTHROPLASTY;  Surgeon: Oneil JAYSON Herald, MD;  Location: [facility] OR;  Service: Orthopedics;  Laterality: Right;  Right Total Hip Arthroplasty   TOTAL KNEE ARTHROPLASTY Left 06/15/2020   Procedure: LEFT TOTAL KNEE ARTHROPLASTY;  Surgeon: Herald Oneil JAYSON, MD;  Location: [facility] OR;  Service: Orthopedics;  Laterality: Left;  Needs RNFA   TUBAL LIGATION     Patient Active Problem List   Diagnosis Date  Noted   Impingement syndrome of right shoulder 12/10/2022   Calculus of gallbladder without cholecystitis without obstruction 11/27/2022   Cigarette smoker 05/05/2022   Nocturnal hypoxemia 02/11/2022   CAD (coronary artery disease) 09/06/2021   Centrilobular emphysema (HCC) 09/06/2021   Quadriceps weakness 10/18/2020   RUQ pain    Wheezing    S/P total knee arthroplasty, 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   Ventral hernia without obstruction or gangrene 04/18/2020   BMI 33.0-33.9,adult 08/12/2018   HLD (hyperlipidemia) 08/12/2018   LLQ abdominal pain 08/12/2018   Mixed stress and urge urinary incontinence 08/12/2018   History of bilateral hip arthroplasty 07/09/2017   Long-term current use of opiate analgesic 02/18/2017   On long term drug therapy 02/06/2017   Chronic low back pain 02/06/2017   Chronic pain syndrome 02/06/2017   Pain in left knee 11/22/2015   Incisional hernia, without obstruction or gangrene 09/09/2012   Acute blood loss anemia 10/20/2011    Class: Acute   COPD GOLD 0 /  CB 03/03/2011   Dyspnea 12/02/2010   Preop cardiovascular exam 10/31/2010   Claudication 10/18/2010   Atrial fibrillation (HCC)  Tobacco abuse    Hypertension     PCP: Tobie Guy, DO   REFERRING PROVIDER: Jacques Arlon PARAS, MD   REFERRING DIAG: Other malaise, Physical deconditioning.   Rationale for Evaluation and Treatment:  Rehabiliation  THERAPY DIAG:  Other abnormalities of gait and mobility  Muscle weakness (generalized)  Other low back pain  ONSET DATE: 3 month onset of worsening joint pain and stiffness, difficulty walking   SUBJECTIVE:                                                                                                                                                                                           SUBJECTIVE STATEMENT:Pain is good today, has been better since starting PT. She will see MD Friday about her right arm pain.  EVAL: She relays 3 month onset of  worsening joint pain and stiffness, difficulty walking. She says her right arm has something going on and she can not use it like she could before. Says she has to get help dressing and it takes her an hour. Says she is having trouble standing and walking, and balance. Her joints are all stiff and hurt all over, Relays chronic back pain. She denies N/T in her arms or legs  PERTINENT HISTORY:  See above PMH, emphysemia, bilat THA, TKA  PAIN:  NPRS scale: 3/10 Pain location:back, all joints, right shoulder Pain description: constant, achy, burning Aggravating factors: the weather, standing too long  Relieving factors: rest, meds, sitting, bending over    PRECAUTIONS: ,  None  RED FLAGS: None   WEIGHT BEARING RESTRICTIONS:  No  FALLS:  Has patient fallen in last 6 months? No  PLOF:  Needs assistance with homemaking  PATIENT GOALS:  Get more active, less stiff, less pain, stand and walk better  OBJECTIVE:  Note: Objective measures were completed at Evaluation unless otherwise noted.  PATIENT SURVEYS:  Patient-Specific Activity Scoring Scheme  0 represents "unable to perform." 10 represents "able to perform at prior level. 0 1 2 3 4 5 6 7 8 9  10 (Date and Score)   Activity Eval     1. Walking for 10 minutes 2     2. Dressing 3     3.     4.    5.    Score 2.5    Total score = sum of the activity scores/number of activities Minimum detectable change (90%CI) for average score = 2 points Minimum detectable change (90%CI) for single activity score = 3 points  POSTURE:  flexed trunk   GAIT: Assistive device utilized: Single point cane Level of assistance: Modified independence Comments: slow gait  speed, gait unsteadiness without AD    UPPER EXTREMITY ROM:  Active ROM Right eval Left eval  Shoulder flexion A:40 P:90 120  Shoulder extension    Shoulder abduction    Shoulder adduction    Shoulder extension    Shoulder internal rotation    Shoulder  external rotation    Elbow flexion    Elbow extension    Wrist flexion    Wrist extension    Wrist ulnar deviation    Wrist radial deviation    Wrist pronation    Wrist supination     (Blank rows = not tested)   UPPER EXTREMITY MMT:  MMT Right eval Left eval  Shoulder flexion 2 4  Shoulder extension    Shoulder abduction    Shoulder adduction    Shoulder extension    Shoulder internal rotation 3 4  Shoulder external rotation 3 4  Middle trapezius    Lower trapezius    Elbow flexion 3 4  Elbow extension 3 4  Wrist flexion    Wrist extension    Wrist ulnar deviation    Wrist radial deviation    Wrist pronation    Wrist supination    Grip strength 4 4   (Blank rows = not tested)   LUMBAR ROM:   Active  A/PROM  eval  Flexion 75%  Extension 10%  Right lateral flexion   Left lateral flexion   Right rotation 25%  Left rotation 25%   (Blank rows = not tested)   LOWER EXTREMITY MMT:    MMT in sitting Right eval Left eval  Hip flexion 3 3  Hip extension 3 3  Hip abduction 3 3  Hip adduction    Hip internal rotation    Hip external rotation    Knee flexion 3 3  Knee extension 3 3  Ankle dorsiflexion    Ankle plantarflexion    Ankle inversion    Ankle eversion     (Blank rows = not tested)     FUNCTIONAL TESTS:  11/17/23 5 times sit to stand: 43.03 seconds, must use UE support Timed up and go (TUG): 37 seconds, with SPC                                                                                                                              TREATMENT DATE:  12/09/23 Nustep L4x10 minutes all four extremities seat 9 LAQs 4# 2x10 B Seated chest press alternating Ue's, X 10 each with red (had to discontinue with right arm due to pain) Seated hamstring curls red 2 X 10 bilat Standing rows red X 15 bilat Sit to stands 2X5 reps from Nu step seat, no UE support Backward shouler rolls x15 Sidestepping in bars 3 round trips Marchwalking in bars 3  round trips UBE L1 X 4 min, switch directions half way  12/01/23 Nustep L4x10 minutes all four extremities seat 8 LAQs 4# 2x10 B Seated chest  press alternating Ue's, X 12 each with red Seated hamstring curls red 2 X 10 bilat Standing rows red X 15 bilat Sit to stands 2X5 reps from Nu step seat, no UE support Backward shouler rolls x15 Sidestepping in bars 3 round trips Marchwalking in bars 3 round trips UBE L1 X 4 min, switch directions half way  11/26/23 Nustep L3x10 minutes all four extremities seat 8 LAQs 4# 2x10 B Seated chest press alternating Ue's, X 10 each with red Seated hamstring curls red 2 X 10 bilat Seated rows red X 10 bilat Sit to stands 2X5 reps from Nu step seat, no UE support Backward shouler rolls x10 Sidestepping in bars 3 round trips Marchwalking in bars 3 round trips UBE L1 X 3 min  11/23/23   Nustep L3x8 minutes all four extremities seat 9  LAQs 4# 2x10 B STS from high mat table 4#  in goblet hold x10 Scap retractions 15x3 seconds Backward shouler rolls x20 Mini-squats holding onto the back of bike x10 (self selected depth) Pillow case door slides for shoulder elevation x6   PATIENT EDUCATION: Education details: HEP, PT plan of care, selfcare Person educated: Patient Education method: Explanation, Demonstration, Verbal cues, and Handouts Education comprehension: verbalized understanding, further education recommended   HOME EXERCISE PROGRAM: Access Code: [email or web address] URL: [email or web address] Date: 11/17/2023 Prepared by: Redell Moose  Exercises - Seated AAROM Shoulder Flexion  - 2 x daily - 6 x weekly - 3 sets - 10 reps - Standing 'L' Stretch at Counter  - 2 x daily - 6 x weekly - 1 sets - 10 reps - 5 hold - Standing March with Counter Support  - 2 x daily - 6 x weekly - 1 sets - 10 reps - Proper Sit to Stand Technique  - 2 x daily - 6 x weekly - 1-2 sets - 5 reps - Supine Shoulder Flexion Extension AAROM with Dowel  - 2 x  daily - 6 x weekly - 1 sets - 10 reps - 5 sec hold  ASSESSMENT:  CLINICAL IMPRESSION: Overall pain has improved this week with PT. She does still lack endurance and strength and PT will work to progress this as tolerated. She does have a lot of pain and difficulty in her right shoulder so she is having this examined by MD Friday.   EVAL:  Patient referred to PT for Physical deconditioning and other malaise. 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  She does have COPD, general weakness and poor endurance noted.  Patient will benefit from skilled PT to improve overall function and to address impairments and limitations listed below.  OBJECTIVE IMPAIRMENTS: decreased activity tolerance for ADL's, difficulty walking, decreased balance, decreased endurance, decreased mobility, decreased ROM, decreased strength, impaired flexibility, impaired UE/LE use, and pain.  ACTIVITY LIMITATIONS: bending, liftting, walking, standing, cleaning, community activity, reaching, carry,   PERSONAL FACTORS: see above PMH  also affecting patient's functional outcome.  REHAB POTENTIAL: Fair    CLINICAL DECISION MAKING: Evolving/moderate complexity  EVALUATION COMPLEXITY: Moderate    GOALS: Short term PT Goals Target date: 12/15/2023   Pt will be I and compliant with HEP. Baseline:  Goal status: New Pt will decrease pain by 25% overall Baseline: Goal status: New  Long term PT goals Target date:01/12/2024    Pt will improve TUG time to  less than 20 seconds to show improved endurance and balance Baseline: Goal status:  New Pt will improve  strength to at least 4/5 MMT to improve functional strength Baseline: Goal status: New Pt will improve Patient specific functional scale (PSFS) to at least 5/10 to show improved function level Baseline: Goal status: New Pt will reduce  pain to overall less than 3/10 with usual activity and work activity. Baseline: Goal status: New Pt will improve 5 times sit to stand test to less than 25 seconds to show improved endurance and balance Baseline: Goal status: New  PLAN: PT FREQUENCY: 1-3 times per week   PT DURATION: 6-8 weeks  PLANNED INTERVENTIONS  97110-Therapeutic exercises, 97530- Therapeutic activity, [id number]- Neuromuscular re-education, 97535- Self Care, [id number]- Manual therapy, and [id number]- Electrical stimulation (unattended)  PLAN FOR NEXT SESSION: work on general strength and conditioning, gait, right arm ROM as able, balance   NEXT MD VISIT: December  Redell Moose, PT, DPT 12/09/23 9:03 AM

## 2023-12-10 ENCOUNTER — Ambulatory Visit: Admitting: Internal Medicine

## 2023-12-14 ENCOUNTER — Ambulatory Visit: Admitting: Physical Therapy

## 2023-12-15 ENCOUNTER — Ambulatory Visit: Admitting: Internal Medicine

## 2023-12-15 ENCOUNTER — Encounter: Payer: Self-pay | Admitting: Internal Medicine

## 2023-12-15 VITALS — BP 116/75 | HR 78 | Ht 62.0 in | Wt 200.8 lb

## 2023-12-15 DIAGNOSIS — G4734 Idiopathic sleep related nonobstructive alveolar hypoventilation: Secondary | ICD-10-CM | POA: Diagnosis not present

## 2023-12-15 DIAGNOSIS — J449 Chronic obstructive pulmonary disease, unspecified: Secondary | ICD-10-CM | POA: Diagnosis not present

## 2023-12-15 DIAGNOSIS — F17210 Nicotine dependence, cigarettes, uncomplicated: Secondary | ICD-10-CM | POA: Diagnosis not present

## 2023-12-15 NOTE — Assessment & Plan Note (Addendum)
 4  min discussion re active cigarette smoking in addition to office E&M  Ask about tobacco use:   ongoing off and on  Advise quitting   I took an extended  opportunity with this patient to outline the consequences of continued cigarette use  in airway disorders based on all the data we have from the multiple national lung health studies (perfomed over decades at millions of dollars in cost)  indicating that smoking cessation, not choice of inhalers or pulmonary physicians, is the most important aspect of her  care.   Assess willingness:  Not committed at this point Assist in quit attempt:  Per PCP when ready Arrange follow up:   Follow up per Primary Care planned            Each RESPIRATORY maintenance medication was reviewed in detail including emphasizing most importantly the difference between maintenance and prns and under what circumstances the prns are to be triggered using an action plan format where appropriate.  Total time for H and P, chart review, counseling, reviewing hfa/neb/02 device(s) and generating customized AVS unique to this office visit / same day charting = 25 min

## 2023-12-15 NOTE — Progress Notes (Addendum)
 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 then has to rest/ uses [facility] parking  Cough: worse 1st thing in am / better p prednisone   Sleep: bed is flat/ 3 pillows under head s resp rx  SABA use: just trelegy / once neb q[place] 02: 2lpm / has meter says over 90s Lung cancer screen: per [facility] Plan A = Automatic = Always=    Trelegy 100 take one click 1st in am and 2 two breaths - tug hard to get it going Work on inhaler technique:  Plan B = Backup (to supplement plan A, not to replace it) Only use your albuterol  inhaler as a rescue medication Plan C = Crisis (instead of Plan B but only if Plan B stops working) - only use your albuterol  nebulizer if you first try Plan B and it fails to help Plan D = Deltasone  = prednisone (refillable)  If ABC not working, Prednisone  10 mg take  4 each am x 2 days,   2 each am x 2 days,  1 each am x 2 days and stop  For cough > mucinex 1200 mg every 12 hours as needed  Please schedule a follow up Johnston visit in 6 weeks, call sooner if needed with all medications /inhalers/ solutions in hand     09/09/2023  6 m f/u ov/Tracy Johnston/Tracy Johnston re: AB  maint on duoneb/budesonide  bid still smoking some Chief Complaint  Patient presents with   COPD    81m f/u - shob as usual   Dyspnea:  go to church/ [employer] gets let off at door /sometimes riding scooter esp at [employer]  Cough: clear mucus/ esp productive  in am clears after sev hours but not using duoneb/bud 1st thing as rec  Sleeping: flat wedge pillow s  noct   resp cc  SABA use:  only when goes out and not using on alternate days with alb neb vs hfa vs nothing prior to exertion  02: 2lpm hs  Patient Instructions  For cough/ congestion >  over the counter mucinex or  mucinex dm  up to maximum of  1200 mg every 12 hours as needed Take nebulizer treatment 1st thing in AM when wake up and another dose 12 hours later The key is to stop smoking completely before smoking completely stops you! Please schedule a follow up visit in 3 months but call sooner if needed  with all medications /inhalers/ solutions in hand   LDSCT  11/17/23  No nodules    12/15/2023  f/u ov/Tracy Johnston/Tracy Johnston re: AB/ 02 hs  maint on duoneb/bud  did not  bring meds   still  smoker Chief Complaint  Patient presents with   COPD    Shob   Dyspnea:  doing ex bike in [place] twice a week x 10 min  and walking with bars x 10 min  Cough: none  Sleeping: flat bed wedge s resp cc  SABA use: not using  02:  2lpm hs / none during the day   No obvious day to day or daytime variability or assoc excess/ purulent sputum or mucus plugs or hemoptysis or cp or chest tightness, subjective wheeze or overt sinus or hb symptoms.    Also denies any obvious fluctuation of symptoms with weather or environmental changes or other aggravating or alleviating factors except as outlined above   No unusual exposure hx or h/o childhood pna/ asthma or knowledge of premature birth.  Current Allergies, Complete Past Medical History, Past Surgical History, Family History, and Social History were reviewed in [employer] record.  ROS  The following are not active complaints unless bolded Hoarseness, sore throat, dysphagia, dental problems, itching, sneezing,  nasal congestion or discharge of excess mucus or purulent secretions, ear ache,   fever, chills, sweats, unintended wt loss or wt gain, classically pleuritic or exertional cp,  orthopnea pnd or arm/hand swelling  or leg swelling, presyncope, palpitations,  abdominal pain, anorexia, nausea, vomiting, diarrhea  or change in bowel habits or change in bladder habits, change in stools or change in urine, dysuria, hematuria,  rash, arthralgias, visual complaints, headache, numbness, weakness or ataxia or problems with walking or coordination,  change in mood or  memory.        Current Meds - - NOTE:   Unable to verify as accurately reflecting what pt takes    Medication Sig   albuterol  (PROAIR  HFA) 108 (90 Base) MCG/ACT inhaler 2 puffs every 4 hours as needed only  if your can't catch your breath   albuterol  (PROVENTIL ) (2.5 MG/3ML) 0.083% nebulizer solution Take 3 mLs (2.5 mg total) by nebulization every 6 (six) hours as needed for wheezing or shortness of breath.   allopurinol  (ZYLOPRIM ) 300 MG tablet TAKE 1 TABLET BY MOUTH DAILY.   budesonide  (PULMICORT ) 0.5 MG/2ML nebulizer solution Take 2 mLs (0.5 mg total) by nebulization 2 (two) times daily.   colchicine 0.6 MG tablet Take 0.6 mg by mouth as needed.   diltiazem  (CARDIZEM  CD) 240 MG 24 hr capsule Take 1 capsule (240 mg total) by mouth daily.   famotidine  (PEPCID ) 20 MG tablet TAKE 1 TABLET BY MOUTH DAILY AFTER SUPPER   furosemide  (LASIX ) 40 MG tablet TAKE 1 TABLET BY MOUTH DAILY   ibuprofen  (ADVIL ) 800 MG tablet Take 1 tablet (800 mg total) by mouth every 8 (eight) hours as needed.   ipratropium-albuterol  (DUONEB) 0.5-2.5 (3) MG/3ML SOLN Take 3 mLs by nebulization 2 (two) times daily.   olmesartan -hydrochlorothiazide  (BENICAR  HCT) 40-12.5 MG per tablet Take 1 tablet by mouth daily.     Oxycodone  HCl 10 MG TABS Take 1 tablet 4 times a day by oral route as needed.   pantoprazole  (PROTONIX ) 40 MG tablet TAKE 1 TABLET BY MOUTH ONCE DAILY 30-60 MINUTES BEFORE first meal of THE DAY   polyethylene glycol (MIRALAX  / GLYCOLAX ) 17 g packet Take 17 g by mouth daily.   predniSONE  (DELTASONE ) 10 MG tablet Take 10 mg by mouth daily with breakfast.   rosuvastatin  (CRESTOR ) 40 MG tablet TAKE 1 TABLET BY MOUTH  DAILY   solifenacin (VESICARE) 10 MG tablet Take 10 mg by mouth daily.   triamcinolone ointment (KENALOG) 0.1 % Apply 1 Application topically 2 (two) times daily.   XARELTO  20 MG TABS tablet TAKE 1 TABLET BY MOUTH DAILY WITH SUPPER                Past Medical History:  Diagnosis Date   Arthritis  Asthma    Atrial fibrillation (HCC)    chronic. Was first diagnosed in her early 35s.   Cancer (HCC) 2003ish   , cervix   COPD (chronic obstructive pulmonary disease) (HCC)    Dysrhythmia    Heart murmur    Hypertension    Insomnia    Malignant neoplasm of kidney (HCC)    rt removed 2003   Mitral regurgitation    Mild to moderate. Normal EF 09/2010   Renal insufficiency    Sickle cell anemia (HCC)    Tobacco abuse        Objective:    Wts  12/15/2023    201   09/09/2023      201  03/03/2023        201  12/31/2022      208  12/03/2022       206  11/04/2022      208   05/05/2022        206   03/27/22 207 lb (93.9 kg)  02/11/22 202 lb 12.8 oz (92 kg)  12/27/21 199 lb (90.3 kg)    Vital signs reviewed  12/15/2023  - Note at rest 02 sats  95% on RA   General appearance:    amb somber bf  / awkward slow somewhat waddling gait     HEENT : Oropharynx  clear      NECK :  without  apparent JVD/ palpable Nodes/TM    LUNGS: no acc muscle use,  Min barrel  contour chest wall with bilateral  insp / exp rhonchi (forgot am neb)  and  without cough on insp or exp maneuvers and min  Hyperresonant  to  percussion bilaterally    CV:   IRIR  no s3 or murmur or increase in P2, and no edema   ABD:  soft and nontender    MS:   ext warm without deformities Or obvious joint restrictions  calf tenderness, cyanosis or clubbing     SKIN: warm and dry without lesions    NEURO:  alert, approp, nl sensorium with  no motor or cerebellar deficits apparent.                     Assessment   Assessment & Plan COPD GOLD 0 /  CB Active but minimal  smoker with centrilobular emphysema on  LDST 08/2021  - 02/11/2022  After extensive coaching inhaler device,  effectiveness =    75% with hfa and dpi  -  03/27/2022   Walked on RA  x  3  lap(s) =  approx 450  ft  @ mod pace, stopped due to end of study with lowest 02 sats 91% and  mild sob p 300 ft but not need to stop -  PFT's  04/08/22  FEV1 1.14 (52 % ) ratio 0.70  p 0 % improvement from saba p 0 prior to study with DLCO  9.87 (51%)   and FV curve min concave and ERV 39% at wt 200   - Allergy screen 11/04/2022 >  Eos 0.1 /  IgE  100 - 11/04/2022 changed to duoneb up to qid with budesonide  0.25 mg in place of trelegy as does not fit criteria for copd and more of an AB pattern  for which dpi may aggravate the cough component  - 03/03/2023  After extensive coaching inhaler device,  effectiveness =    75% (short Ti) continue proair  prn and maint with duoneb/bud 0.5 bid  Doing better on duoneb/bud as above but needs to remember to use it 1st thing in am and before heading to PT   F/u can be  q 6 m, sooner prn    Nocturnal hypoxemia On 2lpm HS at 1st pulmonary ov 02/11/2022  --  03/27/2022   Walked on RA  x  3  lap(s) =  approx 450  ft  @ mod pace, stopped due to end of study with lowest 02 sats 91% and  mild sob p 300 ft but not need to stop - 09/09/2023   Walked on RA  x   approx 100  ft  @ slow  pace, stopped due to knee popping  with lowest 02 sats 93%   >>> no change in rx = 2lpm hs only    Cigarette smoker 4  min discussion re active cigarette smoking in addition to Johnston E&M  Ask about tobacco use:   ongoing off and on  Advise quitting   I took an extended  opportunity with this patient to outline the consequences of continued cigarette use  in airway disorders based on all the data we have from the multiple national lung health studies (perfomed over decades at millions of dollars in cost)  indicating that smoking cessation, not choice of inhalers or pulmonary physicians, is the most important aspect of her  care.   Assess  willingness:  Not committed at this point Assist in quit attempt:  Per PCP when ready Arrange follow up:   Follow up per Primary Care planned            Each RESPIRATORY maintenance medication was reviewed in detail including emphasizing most importantly the difference between maintenance and prns and under what circumstances the prns are to be triggered using an action plan format where appropriate.  Total time for H and P, chart review, counseling, reviewing hfa/neb/02 device(s) and generating customized AVS unique to this Johnston visit / same day charting = 25 min          AVS  Patient Instructions  To get the most out of exercise, you need to be continuously aware that you are short of breath, but never out of breath, for at least 20- 30 minutes daily. As you improve, it will actually be easier for you to do the same amount of exercise  in  30 minutes so always push to the level where you are short of breath.   Remember the nebulizer is 1st thing in am and again sometime in evening   Be sure to time it where you use the nebulizer right before your leave to go to PT   Please schedule a follow up visit in 6 months but call sooner if needed            Ozell America, MD 12/15/2023

## 2023-12-15 NOTE — Assessment & Plan Note (Addendum)
 On 2lpm HS at 1st pulmonary ov 02/11/2022  --  03/27/2022   Walked on RA  x  3  lap(s) =  approx 450  ft  @ mod pace, stopped due to end of study with lowest 02 sats 91% and  mild sob p 300 ft but not need to stop - 09/09/2023   Walked on RA  x   approx 100  ft  @ slow  pace, stopped due to knee popping  with lowest 02 sats 93%   >>> no change in rx = 2lpm hs only

## 2023-12-15 NOTE — Assessment & Plan Note (Addendum)
 Active but minimal  smoker with centrilobular emphysema on LDST 08/2021  - 02/11/2022  After extensive coaching inhaler device,  effectiveness =    75% with hfa and dpi  -  03/27/2022   Walked on RA  x  3  lap(s) =  approx 450  ft  @ mod pace, stopped due to end of study with lowest 02 sats 91% and  mild sob p 300 ft but not need to stop -  PFT's  04/08/22  FEV1 1.14 (52 % ) ratio 0.70  p 0 % improvement from saba p 0 prior to study with DLCO  9.87 (51%)   and FV curve min concave and ERV 39% at wt 200   - Allergy screen 11/04/2022 >  Eos 0.1 /  IgE  100 - 11/04/2022 changed to duoneb up to qid with budesonide  0.25 mg in place of trelegy as does not fit criteria for copd and more of an AB pattern  for which dpi may aggravate the cough component  - 03/03/2023  After extensive coaching inhaler device,  effectiveness =    75% (short Ti) continue proair  prn and maint with duoneb/bud 0.5 bid   Doing better on duoneb/bud as above but needs to remember to use it 1st thing in am and before heading to PT   F/u can be  q 6 m, sooner prn

## 2023-12-15 NOTE — Patient Instructions (Addendum)
 To get the most out of exercise, you need to be continuously aware that you are short of breath, but never out of breath, for at least 20- 30 minutes daily. As you improve, it will actually be easier for you to do the same amount of exercise  in  30 minutes so always push to the level where you are short of breath.   Remember the nebulizer is 1st thing in am and again sometime in evening   Be sure to time it where you use the nebulizer right before your leave to go to PT   Please schedule a follow up visit in 6 months but call sooner if needed

## 2023-12-16 ENCOUNTER — Ambulatory Visit: Admitting: Physical Therapy

## 2023-12-21 ENCOUNTER — Encounter: Payer: Self-pay | Admitting: Physical Therapy

## 2023-12-21 ENCOUNTER — Ambulatory Visit: Admitting: Physical Therapy

## 2023-12-21 DIAGNOSIS — R2689 Other abnormalities of gait and mobility: Secondary | ICD-10-CM | POA: Diagnosis not present

## 2023-12-21 DIAGNOSIS — M5459 Other low back pain: Secondary | ICD-10-CM

## 2023-12-21 DIAGNOSIS — M6281 Muscle weakness (generalized): Secondary | ICD-10-CM

## 2023-12-21 NOTE — Therapy (Signed)
 OUTPATIENT PHYSICAL THERAPY TREATMENT    Patient Name: Tracy Johnston MRN: [id number] DOB:Aug 05, 1950, 73 y.o., female Today's Date: 12/21/2023  END OF SESSION:  PT End of Session - 12/21/23 1101     Visit Number 7    Number of Visits 12    Date for Recertification  01/12/24    PT Start Time 1101    PT Stop Time 1142    PT Time Calculation (min) 41 min    Activity Tolerance Patient limited by fatigue    Behavior During Therapy [facility] for tasks assessed/performed              Past Medical History:  Diagnosis Date   Arthritis    Asthma    Atrial fibrillation (HCC)    chronic. Was first diagnosed in her early 28s.   CAD (coronary artery disease) 09/06/2021   Cancer (HCC) 2003ish   , cervix   COPD (chronic obstructive pulmonary disease) (HCC)    Dysrhythmia    Heart murmur    Hypertension    Insomnia    Malignant neoplasm of kidney (HCC)    rt removed 2003   Mitral regurgitation    Mild to moderate. Normal EF 09/2010   Renal insufficiency    Sickle cell anemia (HCC)    Tobacco abuse    Past Surgical History:  Procedure Laterality Date   ABDOMINAL HYSTERECTOMY     arm fracture     rods, pins and bone graft.- to left arm from hip   BONE GRAFT HIP ILIAC CREST     to left arm   CERVICAL CONIZATION W/BX     NEPHRECTOMY  2003   TONSILLECTOMY     TOTAL HIP ARTHROPLASTY  10/17/2011   Procedure: TOTAL HIP ARTHROPLASTY;  Surgeon: Oneil JAYSON Herald, MD;  Location: [facility] OR;  Service: Orthopedics;  Laterality: Left;  Left Total Hip Arthroplasty   TOTAL HIP ARTHROPLASTY  01/02/2012   Procedure: TOTAL HIP ARTHROPLASTY;  Surgeon: Oneil JAYSON Herald, MD;  Location: [facility] OR;  Service: Orthopedics;  Laterality: Right;  Right Total Hip Arthroplasty   TOTAL KNEE ARTHROPLASTY Left 06/15/2020   Procedure: LEFT TOTAL KNEE ARTHROPLASTY;  Surgeon: Herald Oneil JAYSON, MD;  Location: [facility] OR;  Service: Orthopedics;  Laterality: Left;  Needs RNFA   TUBAL LIGATION     Patient Active Problem List   Diagnosis Date  Noted   Impingement syndrome of right shoulder 12/10/2022   Calculus of gallbladder without cholecystitis without obstruction 11/27/2022   Cigarette smoker 05/05/2022   Nocturnal hypoxemia 02/11/2022   CAD (coronary artery disease) 09/06/2021   Centrilobular emphysema (HCC) 09/06/2021   Quadriceps weakness 10/18/2020   RUQ pain    Wheezing    S/P total knee arthroplasty, 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   Ventral hernia without obstruction or gangrene 04/18/2020   BMI 33.0-33.9,adult 08/12/2018   HLD (hyperlipidemia) 08/12/2018   LLQ abdominal pain 08/12/2018   Mixed stress and urge urinary incontinence 08/12/2018   History of bilateral hip arthroplasty 07/09/2017   Long-term current use of opiate analgesic 02/18/2017   On long term drug therapy 02/06/2017   Chronic low back pain 02/06/2017   Chronic pain syndrome 02/06/2017   Pain in left knee 11/22/2015   Incisional hernia, without obstruction or gangrene 09/09/2012   Acute blood loss anemia 10/20/2011    Class: Acute   COPD GOLD 0 /  CB 03/03/2011   Dyspnea 12/02/2010   Preop cardiovascular exam 10/31/2010   Claudication 10/18/2010   Atrial fibrillation (HCC)  Tobacco abuse    Hypertension     PCP: Tobie Guy, DO   REFERRING PROVIDER: Jacques Arlon PARAS, MD   REFERRING DIAG: Other malaise, Physical deconditioning.   Rationale for Evaluation and Treatment:  Rehabiliation  THERAPY DIAG:  Other abnormalities of gait and mobility  Muscle weakness (generalized)  Other low back pain  ONSET DATE: 3 month onset of worsening joint pain and stiffness, difficulty walking   SUBJECTIVE:                                                                                                                                                                                           SUBJECTIVE STATEMENT:  I went to the doctor, he said he wanted me to do walking on the TM for 30 minutes. Getting a shoulder MRI Saturday, they think it might be  rotator cuff.    EVAL: She relays 3 month onset of worsening joint pain and stiffness, difficulty walking. She says her right arm has something going on and she can not use it like she could before. Says she has to get help dressing and it takes her an hour. Says she is having trouble standing and walking, and balance. Her joints are all stiff and hurt all over, Relays chronic back pain. She denies N/T in her arms or legs  PERTINENT HISTORY:  See above PMH, emphysemia, bilat THA, TKA  PAIN:  NPRS scale: not too bad  Pain location:back, all joints, right shoulder Pain description: constant, achy, burning Aggravating factors: the weather, standing too long  Relieving factors: rest, meds, sitting, bending over    PRECAUTIONS: ,  None  RED FLAGS: None   WEIGHT BEARING RESTRICTIONS:  No  FALLS:  Has patient fallen in last 6 months? No  PLOF:  Needs assistance with homemaking  PATIENT GOALS:  Get more active, less stiff, less pain, stand and walk better  OBJECTIVE:  Note: Objective measures were completed at Evaluation unless otherwise noted.  PATIENT SURVEYS:  Patient-Specific Activity Scoring Scheme  0 represents "unable to perform." 10 represents "able to perform at prior level. 0 1 2 3 4 5 6 7 8 9  10 (Date and Score)   Activity Eval     1. Walking for 10 minutes 2     2. Dressing 3     3.     4.    5.    Score 2.5    Total score = sum of the activity scores/number of activities Minimum detectable change (90%CI) for average score = 2 points Minimum detectable change (90%CI) for single activity score = 3 points  POSTURE:  flexed  trunk   GAIT: Assistive device utilized: Single point cane Level of assistance: Modified independence Comments: slow gait speed, gait unsteadiness without AD    UPPER EXTREMITY ROM:  Active ROM Right eval Left eval  Shoulder flexion A:40 P:90 120  Shoulder extension    Shoulder abduction    Shoulder adduction     Shoulder extension    Shoulder internal rotation    Shoulder external rotation    Elbow flexion    Elbow extension    Wrist flexion    Wrist extension    Wrist ulnar deviation    Wrist radial deviation    Wrist pronation    Wrist supination     (Blank rows = not tested)   UPPER EXTREMITY MMT:  MMT Right eval Left eval  Shoulder flexion 2 4  Shoulder extension    Shoulder abduction    Shoulder adduction    Shoulder extension    Shoulder internal rotation 3 4  Shoulder external rotation 3 4  Middle trapezius    Lower trapezius    Elbow flexion 3 4  Elbow extension 3 4  Wrist flexion    Wrist extension    Wrist ulnar deviation    Wrist radial deviation    Wrist pronation    Wrist supination    Grip strength 4 4   (Blank rows = not tested)   LUMBAR ROM:   Active  A/PROM  eval  Flexion 75%  Extension 10%  Right lateral flexion   Left lateral flexion   Right rotation 25%  Left rotation 25%   (Blank rows = not tested)   LOWER EXTREMITY MMT:    MMT in sitting Right eval Left eval  Hip flexion 3 3  Hip extension 3 3  Hip abduction 3 3  Hip adduction    Hip internal rotation    Hip external rotation    Knee flexion 3 3  Knee extension 3 3  Ankle dorsiflexion    Ankle plantarflexion    Ankle inversion    Ankle eversion     (Blank rows = not tested)     FUNCTIONAL TESTS:  11/17/23 5 times sit to stand: 43.03 seconds, must use UE support Timed up and go (TUG): 37 seconds, with SPC                                                                                                                              TREATMENT DATE:   12/21/23   Nustep L4x10 minutes all four extremities seat 9, rest due to SOB PRN  Walking from yoga ball stack to silver trash can and back 2# each LE, SPC   LAQs 4# x15 B alternating  Seated HS curls 4# x15 B alternating  Standing marches yellow TB x10 B alternating  Standing hip ABD yellow TB x10 B alternating   Standing hip extension yellow TB x10 B alternating   Wide tandem 2x30 seconds B  Standing on foam 3x30 seconds      12/09/23 Nustep L4x10 minutes all four extremities seat 9 LAQs 4# 2x10 B Seated chest press alternating Ue's, X 10 each with red (had to discontinue with right arm due to pain) Seated hamstring curls red 2 X 10 bilat Standing rows red X 15 bilat Sit to stands 2X5 reps from Nu step seat, no UE support Backward shouler rolls x15 Sidestepping in bars 3 round trips Marchwalking in bars 3 round trips UBE L1 X 4 min, switch directions half way  12/01/23 Nustep L4x10 minutes all four extremities seat 8 LAQs 4# 2x10 B Seated chest press alternating Ue's, X 12 each with red Seated hamstring curls red 2 X 10 bilat Standing rows red X 15 bilat Sit to stands 2X5 reps from Nu step seat, no UE support Backward shouler rolls x15 Sidestepping in bars 3 round trips Marchwalking in bars 3 round trips UBE L1 X 4 min, switch directions half way  11/26/23 Nustep L3x10 minutes all four extremities seat 8 LAQs 4# 2x10 B Seated chest press alternating Ue's, X 10 each with red Seated hamstring curls red 2 X 10 bilat Seated rows red X 10 bilat Sit to stands 2X5 reps from Nu step seat, no UE support Backward shouler rolls x10 Sidestepping in bars 3 round trips Marchwalking in bars 3 round trips UBE L1 X 3 min  11/23/23   Nustep L3x8 minutes all four extremities seat 9  LAQs 4# 2x10 B STS from high mat table 4#  in goblet hold x10 Scap retractions 15x3 seconds Backward shouler rolls x20 Mini-squats holding onto the back of bike x10 (self selected depth) Pillow case door slides for shoulder elevation x6   PATIENT EDUCATION: Education details: HEP, PT plan of care, selfcare Person educated: Patient Education method: Explanation, Demonstration, Verbal cues, and Handouts Education comprehension: verbalized understanding, further education recommended   HOME EXERCISE  PROGRAM: Access Code: [email or web address] URL: [email or web address] Date: 11/17/2023 Prepared by: Redell Moose  Exercises - Seated AAROM Shoulder Flexion  - 2 x daily - 6 x weekly - 3 sets - 10 reps - Standing 'L' Stretch at Counter  - 2 x daily - 6 x weekly - 1 sets - 10 reps - 5 hold - Standing March with Counter Support  - 2 x daily - 6 x weekly - 1 sets - 10 reps - Proper Sit to Stand Technique  - 2 x daily - 6 x weekly - 1-2 sets - 5 reps - Supine Shoulder Flexion Extension AAROM with Dowel  - 2 x daily - 6 x weekly - 1 sets - 10 reps - 5 sec hold  ASSESSMENT:  CLINICAL IMPRESSION:   Arrives today doing OK, she will be getting a MRI on her shoulder this weekend. Her MD wants her to get back to regular walking on the TM, offered to review use and safety of machine with her, but she politely declined. Continued working on functional strength and functional activity tolerance this visit, will continue to challenge her. Deferred UE exercise today, would like to see what her MRI says before progressing this region since her pain has not improved here.       EVAL:  Patient referred to PT for Physical deconditioning and other malaise. 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  She does have COPD, general weakness and poor endurance noted.  Patient will benefit  from skilled PT to improve overall function and to address impairments and limitations listed below.  OBJECTIVE IMPAIRMENTS: decreased activity tolerance for ADL's, difficulty walking, decreased balance, decreased endurance, decreased mobility, decreased ROM, decreased strength, impaired flexibility, impaired UE/LE use, and pain.  ACTIVITY LIMITATIONS: bending, liftting, walking, standing, cleaning, community activity, reaching, carry,   PERSONAL FACTORS: see above PMH  also affecting patient's  functional outcome.  REHAB POTENTIAL: Fair    CLINICAL DECISION MAKING: Evolving/moderate complexity  EVALUATION COMPLEXITY: Moderate    GOALS: Short term PT Goals Target date: 12/15/2023   Pt will be I and compliant with HEP. Baseline:  Goal status: New Pt will decrease pain by 25% overall Baseline: Goal status: New  Long term PT goals Target date:01/12/2024    Pt will improve TUG time to  less than 20 seconds to show improved endurance and balance Baseline: Goal status: New Pt will improve  strength to at least 4/5 MMT to improve functional strength Baseline: Goal status: New Pt will improve Patient specific functional scale (PSFS) to at least 5/10 to show improved function level Baseline: Goal status: New Pt will reduce pain to overall less than 3/10 with usual activity and work activity. Baseline: Goal status: New Pt will improve 5 times sit to stand test to less than 25 seconds to show improved endurance and balance Baseline: Goal status: New  PLAN: PT FREQUENCY: 1-3 times per week   PT DURATION: 6-8 weeks  PLANNED INTERVENTIONS  [id number]-Therapeutic exercises, 97530- Therapeutic activity, [id number]- Neuromuscular re-education, 97535- Self Care, [id number]- Manual therapy, and [id number]- Electrical stimulation (unattended)  PLAN FOR NEXT SESSION: work on general strength and conditioning, gait, right arm ROM as able, balance, HEP updates as appropriate. Does she have MRI results?   NEXT MD VISIT: December after MRI   Josette Rough, PT, DPT 12/21/23 11:42 AM

## 2023-12-29 ENCOUNTER — Encounter: Payer: Self-pay | Admitting: Physical Therapy

## 2023-12-29 ENCOUNTER — Ambulatory Visit: Admitting: Physical Therapy

## 2023-12-29 DIAGNOSIS — R2689 Other abnormalities of gait and mobility: Secondary | ICD-10-CM | POA: Diagnosis present

## 2023-12-29 DIAGNOSIS — M6281 Muscle weakness (generalized): Secondary | ICD-10-CM | POA: Diagnosis present

## 2023-12-29 DIAGNOSIS — M5459 Other low back pain: Secondary | ICD-10-CM | POA: Insufficient documentation

## 2023-12-29 NOTE — Therapy (Signed)
 OUTPATIENT PHYSICAL THERAPY TREATMENT    Patient Name: Tracy Johnston MRN: [id number] DOB:1950/11/20, 73 y.o., female Today's Date: 12/29/2023  END OF SESSION:  PT End of Session - 12/29/23 1019     Visit Number 8    Number of Visits 12    Date for Recertification  01/12/24    PT Start Time 1015    PT Stop Time 1100    PT Time Calculation (min) 45 min    Activity Tolerance Patient limited by fatigue    Behavior During Therapy [facility] for tasks assessed/performed              Past Medical History:  Diagnosis Date   Arthritis    Asthma    Atrial fibrillation (HCC)    chronic. Was first diagnosed in her early 1s.   CAD (coronary artery disease) 09/06/2021   Cancer (HCC) 2003ish   , cervix   COPD (chronic obstructive pulmonary disease) (HCC)    Dysrhythmia    Heart murmur    Hypertension    Insomnia    Malignant neoplasm of kidney (HCC)    rt removed 2003   Mitral regurgitation    Mild to moderate. Normal EF 09/2010   Renal insufficiency    Sickle cell anemia (HCC)    Tobacco abuse    Past Surgical History:  Procedure Laterality Date   ABDOMINAL HYSTERECTOMY     arm fracture     rods, pins and bone graft.- to left arm from hip   BONE GRAFT HIP ILIAC CREST     to left arm   CERVICAL CONIZATION W/BX     NEPHRECTOMY  2003   TONSILLECTOMY     TOTAL HIP ARTHROPLASTY  10/17/2011   Procedure: TOTAL HIP ARTHROPLASTY;  Surgeon: Oneil JAYSON Herald, MD;  Location: [facility] OR;  Service: Orthopedics;  Laterality: Left;  Left Total Hip Arthroplasty   TOTAL HIP ARTHROPLASTY  01/02/2012   Procedure: TOTAL HIP ARTHROPLASTY;  Surgeon: Oneil JAYSON Herald, MD;  Location: [facility] OR;  Service: Orthopedics;  Laterality: Right;  Right Total Hip Arthroplasty   TOTAL KNEE ARTHROPLASTY Left 06/15/2020   Procedure: LEFT TOTAL KNEE ARTHROPLASTY;  Surgeon: Herald Oneil JAYSON, MD;  Location: [facility] OR;  Service: Orthopedics;  Laterality: Left;  Needs RNFA   TUBAL LIGATION     Patient Active Problem List   Diagnosis Date  Noted   Impingement syndrome of right shoulder 12/10/2022   Calculus of gallbladder without cholecystitis without obstruction 11/27/2022   Cigarette smoker 05/05/2022   Nocturnal hypoxemia 02/11/2022   CAD (coronary artery disease) 09/06/2021   Centrilobular emphysema (HCC) 09/06/2021   Quadriceps weakness 10/18/2020   RUQ pain    Wheezing    S/P total knee arthroplasty, 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   Ventral hernia without obstruction or gangrene 04/18/2020   BMI 33.0-33.9,adult 08/12/2018   HLD (hyperlipidemia) 08/12/2018   LLQ abdominal pain 08/12/2018   Mixed stress and urge urinary incontinence 08/12/2018   History of bilateral hip arthroplasty 07/09/2017   Long-term current use of opiate analgesic 02/18/2017   On long term drug therapy 02/06/2017   Chronic low back pain 02/06/2017   Chronic pain syndrome 02/06/2017   Pain in left knee 11/22/2015   Incisional hernia, without obstruction or gangrene 09/09/2012   Acute blood loss anemia 10/20/2011    Class: Acute   COPD GOLD 0 /  CB 03/03/2011   Dyspnea 12/02/2010   Preop cardiovascular exam 10/31/2010   Claudication 10/18/2010   Atrial fibrillation (HCC)  Tobacco abuse    Hypertension     PCP: Tobie Guy, DO   REFERRING PROVIDER: Jacques Arlon PARAS, MD   REFERRING DIAG: Other malaise, Physical deconditioning.   Rationale for Evaluation and Treatment:  Rehabiliation  THERAPY DIAG:  Other abnormalities of gait and mobility  Muscle weakness (generalized)  Other low back pain  ONSET DATE: 3 month onset of worsening joint pain and stiffness, difficulty walking   SUBJECTIVE:                                                                                                                                                                                           SUBJECTIVE STATEMENT: Pain was bad this morning but it has eased off some now that she is moving around   EVAL: She relays 3 month onset of worsening joint pain  and stiffness, difficulty walking. She says her right arm has something going on and she can not use it like she could before. Says she has to get help dressing and it takes her an hour. Says she is having trouble standing and walking, and balance. Her joints are all stiff and hurt all over, Relays chronic back pain. She denies N/T in her arms or legs  PERTINENT HISTORY:  See above PMH, emphysemia, bilat THA, TKA  PAIN:  NPRS scale: not too bad  Pain location:back, all joints, right shoulder Pain description: constant, achy, burning Aggravating factors: the weather, standing too long  Relieving factors: rest, meds, sitting, bending over    PRECAUTIONS: ,  None  RED FLAGS: None   WEIGHT BEARING RESTRICTIONS:  No  FALLS:  Has patient fallen in last 6 months? No  PLOF:  Needs assistance with homemaking  PATIENT GOALS:  Get more active, less stiff, less pain, stand and walk better  OBJECTIVE:  Note: Objective measures were completed at Evaluation unless otherwise noted.  PATIENT SURVEYS:  Patient-Specific Activity Scoring Scheme  0 represents "unable to perform." 10 represents "able to perform at prior level. 0 1 2 3 4 5 6 7 8 9  10 (Date and Score)   Activity Eval     1. Walking for 10 minutes 2     2. Dressing 3     3.     4.    5.    Score 2.5    Total score = sum of the activity scores/number of activities Minimum detectable change (90%CI) for average score = 2 points Minimum detectable change (90%CI) for single activity score = 3 points  POSTURE:  flexed trunk   GAIT: Assistive device utilized: Single point cane Level of assistance: Modified independence Comments:  slow gait speed, gait unsteadiness without AD    UPPER EXTREMITY ROM:  Active ROM Right eval Left eval  Shoulder flexion A:40 P:90 120  Shoulder extension    Shoulder abduction    Shoulder adduction    Shoulder extension    Shoulder internal rotation    Shoulder external  rotation    Elbow flexion    Elbow extension    Wrist flexion    Wrist extension    Wrist ulnar deviation    Wrist radial deviation    Wrist pronation    Wrist supination     (Blank rows = not tested)   UPPER EXTREMITY MMT:  MMT Right eval Left eval  Shoulder flexion 2 4  Shoulder extension    Shoulder abduction    Shoulder adduction    Shoulder extension    Shoulder internal rotation 3 4  Shoulder external rotation 3 4  Middle trapezius    Lower trapezius    Elbow flexion 3 4  Elbow extension 3 4  Wrist flexion    Wrist extension    Wrist ulnar deviation    Wrist radial deviation    Wrist pronation    Wrist supination    Grip strength 4 4   (Blank rows = not tested)   LUMBAR ROM:   Active  A/PROM  eval  Flexion 75%  Extension 10%  Right lateral flexion   Left lateral flexion   Right rotation 25%  Left rotation 25%   (Blank rows = not tested)   LOWER EXTREMITY MMT:    MMT in sitting Right eval Left eval  Hip flexion 3 3  Hip extension 3 3  Hip abduction 3 3  Hip adduction    Hip internal rotation    Hip external rotation    Knee flexion 3 3  Knee extension 3 3  Ankle dorsiflexion    Ankle plantarflexion    Ankle inversion    Ankle eversion     (Blank rows = not tested)     FUNCTIONAL TESTS:  11/17/23 5 times sit to stand: 43.03 seconds, must use UE support Timed up and go (TUG): 37 seconds, with SPC                                                                                                                              TREATMENT DATE:  12/29/23 Nustep L5x15 minutes all four extremities seat 9 for functional endurance LAQs 4# X 20 bilat Seated hamstring curls red X 20 bilat Walking with SPC for endurance 200 feet, seated rest beak 2 min, then 100 feet. (She gets short of breath but SPO2 levels remained above 96%) Sit to stands 2X5 reps from Nu step seat, no UE support Sidestepping in bars 3 round trips Marchwalking in bars 3 round  trips Discussed walking program at home of at least 3 short walks as long as she can tolerate.   12/21/23   Nustep  L4x10 minutes all four extremities seat 9, rest due to SOB PRN  Walking from yoga ball stack to silver trash can and back 2# each LE, SPC   LAQs 4# x15 B alternating  Seated HS curls 4# x15 B alternating  Standing marches yellow TB x10 B alternating  Standing hip ABD yellow TB x10 B alternating  Standing hip extension yellow TB x10 B alternating   Wide tandem 2x30 seconds B Standing on foam 3x30 seconds    PATIENT EDUCATION: Education details: HEP, PT plan of care, selfcare Person educated: Patient Education method: Explanation, Demonstration, Verbal cues, and Handouts Education comprehension: verbalized understanding, further education recommended   HOME EXERCISE PROGRAM: Access Code: [email or web address] URL: [email or web address] Date: 11/17/2023 Prepared by: Redell Moose  Exercises - Seated AAROM Shoulder Flexion  - 2 x daily - 6 x weekly - 3 sets - 10 reps - Standing 'L' Stretch at Counter  - 2 x daily - 6 x weekly - 1 sets - 10 reps - 5 hold - Standing March with Counter Support  - 2 x daily - 6 x weekly - 1 sets - 10 reps - Proper Sit to Stand Technique  - 2 x daily - 6 x weekly - 1-2 sets - 5 reps - Supine Shoulder Flexion Extension AAROM with Dowel  - 2 x daily - 6 x weekly - 1 sets - 10 reps - 5 sec hold  ASSESSMENT:  CLINICAL IMPRESSION:   She had  MRI on her shoulder this weekend but no results yet from this and I could not see this anywhere in her chart. MD wants to increase her walking program so we implemented this more today and I discussed the need to walk frequent short walks to build up more endurance and breathing for longer walks that she us  unable to do currently.     EVAL:  Patient referred to PT for Physical deconditioning and other malaise. She has chronic back pain, general weakness, general joint stiffness, difficulty walking,  and right shoulder impairments consistent with RTC injury and adhesive capsulitis.  She does have COPD, general weakness and poor endurance noted.  Patient will benefit from skilled PT to improve overall function and to address impairments and limitations listed below.  OBJECTIVE IMPAIRMENTS: decreased activity tolerance for ADL's, difficulty walking, decreased balance, decreased endurance, decreased mobility, decreased ROM, decreased strength, impaired flexibility, impaired UE/LE use, and pain.  ACTIVITY LIMITATIONS: bending, liftting, walking, standing, cleaning, community activity, reaching, carry,   PERSONAL FACTORS: see above PMH  also affecting patient's functional outcome.  REHAB POTENTIAL: Fair    CLINICAL DECISION MAKING: Evolving/moderate complexity  EVALUATION COMPLEXITY: Moderate    GOALS: Short term PT Goals Target date: 12/15/2023   Pt will be I and compliant with HEP. Baseline:  Goal status: New Pt will decrease pain by 25% overall Baseline: Goal status: New  Long term PT goals Target date:01/12/2024    Pt will improve TUG time to  less than 20 seconds to show improved endurance and balance Baseline: Goal status: New Pt will improve  strength to at least 4/5 MMT to improve functional strength Baseline: Goal status: New Pt will improve Patient specific functional scale (PSFS) to at least 5/10 to show improved function level Baseline: Goal status: New Pt will reduce pain to overall less than 3/10 with usual activity and work activity. Baseline: Goal status: New Pt will improve 5 times sit to stand test to less than 25 seconds to show improved endurance and balance Baseline:  Goal status: New  PLAN: PT FREQUENCY: 1-3 times per week   PT DURATION: 6-8 weeks  PLANNED INTERVENTIONS  97110-Therapeutic exercises, 97530- Therapeutic activity, [id number]- Neuromuscular re-education, 97535- Self Care, [id number]- Manual therapy, and G0283- Electrical stimulation  (unattended)  PLAN FOR NEXT SESSION: work on general strength and conditioning, gait, right arm ROM as able, balance, HEP updates as appropriate. Does she have MRI results?   NEXT MD VISIT: December after MRI   Redell Moose, PT, DPT 12/29/23 10:20 AM

## 2024-01-07 ENCOUNTER — Ambulatory Visit: Admitting: Physical Therapy

## 2024-01-07 ENCOUNTER — Encounter: Payer: Self-pay | Admitting: Physical Therapy

## 2024-01-07 DIAGNOSIS — M6281 Muscle weakness (generalized): Secondary | ICD-10-CM

## 2024-01-07 DIAGNOSIS — M5459 Other low back pain: Secondary | ICD-10-CM

## 2024-01-07 DIAGNOSIS — R2689 Other abnormalities of gait and mobility: Secondary | ICD-10-CM | POA: Diagnosis not present

## 2024-01-07 NOTE — Therapy (Signed)
 OUTPATIENT PHYSICAL THERAPY TREATMENT    Patient Name: Tracy Johnston MRN: [id number] DOB:05-20-1950, 73 y.o., female Today's Date: 01/07/2024  END OF SESSION:  PT End of Session - 01/07/24 1311     Visit Number 9    Number of Visits 12    Date for Recertification  01/12/24    PT Start Time 1300    PT Stop Time 1340    PT Time Calculation (min) 40 min    Activity Tolerance Patient limited by fatigue    Behavior During Therapy [facility] for tasks assessed/performed               Past Medical History:  Diagnosis Date   Arthritis    Asthma    Atrial fibrillation (HCC)    chronic. Was first diagnosed in her early 73s.   CAD (coronary artery disease) 09/06/2021   Cancer (HCC) 2003ish   , cervix   COPD (chronic obstructive pulmonary disease) (HCC)    Dysrhythmia    Heart murmur    Hypertension    Insomnia    Malignant neoplasm of kidney (HCC)    rt removed 2003   Mitral regurgitation    Mild to moderate. Normal EF 09/2010   Renal insufficiency    Sickle cell anemia (HCC)    Tobacco abuse    Past Surgical History:  Procedure Laterality Date   ABDOMINAL HYSTERECTOMY     arm fracture     rods, pins and bone graft.- to left arm from hip   BONE GRAFT HIP ILIAC CREST     to left arm   CERVICAL CONIZATION W/BX     NEPHRECTOMY  2003   TONSILLECTOMY     TOTAL HIP ARTHROPLASTY  10/17/2011   Procedure: TOTAL HIP ARTHROPLASTY;  Surgeon: Oneil JAYSON Herald, MD;  Location: [facility] OR;  Service: Orthopedics;  Laterality: Left;  Left Total Hip Arthroplasty   TOTAL HIP ARTHROPLASTY  01/02/2012   Procedure: TOTAL HIP ARTHROPLASTY;  Surgeon: Oneil JAYSON Herald, MD;  Location: [facility] OR;  Service: Orthopedics;  Laterality: Right;  Right Total Hip Arthroplasty   TOTAL KNEE ARTHROPLASTY Left 06/15/2020   Procedure: LEFT TOTAL KNEE ARTHROPLASTY;  Surgeon: Herald Oneil JAYSON, MD;  Location: [facility] OR;  Service: Orthopedics;  Laterality: Left;  Needs RNFA   TUBAL LIGATION     Patient Active Problem List   Diagnosis  Date Noted   Impingement syndrome of right shoulder 12/10/2022   Calculus of gallbladder without cholecystitis without obstruction 11/27/2022   Cigarette smoker 05/05/2022   Nocturnal hypoxemia 02/11/2022   CAD (coronary artery disease) 09/06/2021   Centrilobular emphysema (HCC) 09/06/2021   Quadriceps weakness 10/18/2020   RUQ pain    Wheezing    S/P total knee arthroplasty, 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   Ventral hernia without obstruction or gangrene 04/18/2020   BMI 33.0-33.9,adult 08/12/2018   HLD (hyperlipidemia) 08/12/2018   LLQ abdominal pain 08/12/2018   Mixed stress and urge urinary incontinence 08/12/2018   History of bilateral hip arthroplasty 07/09/2017   Long-term current use of opiate analgesic 02/18/2017   On long term drug therapy 02/06/2017   Chronic low back pain 02/06/2017   Chronic pain syndrome 02/06/2017   Pain in left knee 11/22/2015   Incisional hernia, without obstruction or gangrene 09/09/2012   Acute blood loss anemia 10/20/2011    Class: Acute   COPD GOLD 0 /  CB 03/03/2011   Dyspnea 12/02/2010   Preop cardiovascular exam 10/31/2010   Claudication 10/18/2010   Atrial fibrillation (HCC)  Tobacco abuse    Hypertension     PCP: Tobie Guy, DO   REFERRING PROVIDER: Jacques Arlon PARAS, MD   REFERRING DIAG: Other malaise, Physical deconditioning.   Rationale for Evaluation and Treatment:  Rehabiliation  THERAPY DIAG:  Other abnormalities of gait and mobility  Muscle weakness (generalized)  Other low back pain  ONSET DATE: 3 month onset of worsening joint pain and stiffness, difficulty walking   SUBJECTIVE:                                                                                                                                                                                           SUBJECTIVE STATEMENT: She says shoulder MRI revealed Rotator cuff problem and she is being referred to surgeon but she does not want to have a surgery.  EVAL:  She relays 3 month onset of worsening joint pain and stiffness, difficulty walking. She says her right arm has something going on and she can not use it like she could before. Says she has to get help dressing and it takes her an hour. Says she is having trouble standing and walking, and balance. Her joints are all stiff and hurt all over, Relays chronic back pain. She denies N/T in her arms or legs  PERTINENT HISTORY:  See above PMH, emphysemia, bilat THA, TKA  PAIN:  NPRS scale: 5  Pain location:back, all joints, right shoulder Pain description: constant, achy, burning Aggravating factors: the weather, standing too long  Relieving factors: rest, meds, sitting, bending over    PRECAUTIONS: ,  None  RED FLAGS: None   WEIGHT BEARING RESTRICTIONS:  No  FALLS:  Has patient fallen in last 6 months? No  PLOF:  Needs assistance with homemaking  PATIENT GOALS:  Get more active, less stiff, less pain, stand and walk better  OBJECTIVE:  Note: Objective measures were completed at Evaluation unless otherwise noted.  PATIENT SURVEYS:  Patient-Specific Activity Scoring Scheme  0 represents unable to perform. 10 represents able to perform at prior level. 0 1 2 3 4 5 6 7 8 9  10 (Date and Score)   Activity Eval     1. Walking for 10 minutes 2     2. Dressing 3     3.     4.    5.    Score 2.5    Total score = sum of the activity scores/number of activities Minimum detectable change (90%CI) for average score = 2 points Minimum detectable change (90%CI) for single activity score = 3 points  POSTURE:  flexed trunk   GAIT: Assistive device utilized: Single point cane Level of  assistance: Modified independence Comments: slow gait speed, gait unsteadiness without AD    UPPER EXTREMITY ROM:  Active ROM Right eval Left eval  Shoulder flexion A:40 P:90 120  Shoulder extension    Shoulder abduction    Shoulder adduction    Shoulder extension    Shoulder  internal rotation    Shoulder external rotation    Elbow flexion    Elbow extension    Wrist flexion    Wrist extension    Wrist ulnar deviation    Wrist radial deviation    Wrist pronation    Wrist supination     (Blank rows = not tested)   UPPER EXTREMITY MMT:  MMT Right eval Left eval  Shoulder flexion 2 4  Shoulder extension    Shoulder abduction    Shoulder adduction    Shoulder extension    Shoulder internal rotation 3 4  Shoulder external rotation 3 4  Middle trapezius    Lower trapezius    Elbow flexion 3 4  Elbow extension 3 4  Wrist flexion    Wrist extension    Wrist ulnar deviation    Wrist radial deviation    Wrist pronation    Wrist supination    Grip strength 4 4   (Blank rows = not tested)   LUMBAR ROM:   Active  A/PROM  eval  Flexion 75%  Extension 10%  Right lateral flexion   Left lateral flexion   Right rotation 25%  Left rotation 25%   (Blank rows = not tested)   LOWER EXTREMITY MMT:    MMT in sitting Right eval Left eval  Hip flexion 3 3  Hip extension 3 3  Hip abduction 3 3  Hip adduction    Hip internal rotation    Hip external rotation    Knee flexion 3 3  Knee extension 3 3  Ankle dorsiflexion    Ankle plantarflexion    Ankle inversion    Ankle eversion     (Blank rows = not tested)     FUNCTIONAL TESTS:  11/17/23 5 times sit to stand: 43.03 seconds, must use UE support Timed up and go (TUG): 37 seconds, with SPC                                                                                                                              TREATMENT DATE:  01/07/24 Nustep L5x15 minutes all four extremities seat 9 for functional endurance LAQs 4# X 2X10 bilat Seated hamstring curls green 2X10 bilat Walking with SPC for endurance 200 feet, seated rest beak 2 min, then 100 feet. (She gets short of breath but SPO2 levels remained above 96%) Sit to stands 2X5 reps from Nu step seat, no UE support Sidestepping in bars  3 round trips Step ups 4 inch step, X 5 bilat with 2 UE support UBE 2 min pushing, 2 min pulling  12/29/23 Nustep L5x15 minutes all four  extremities seat 9 for functional endurance LAQs 4# X 20 bilat Seated hamstring curls red X 20 bilat Walking with SPC for endurance 200 feet, seated rest beak 2 min, then 100 feet. (She gets short of breath but SPO2 levels remained above 96%) Sit to stands 2X5 reps from Nu step seat, no UE support Sidestepping in bars 3 round trips Marchwalking in bars 3 round trips Discussed walking program at home of at least 3 short walks as long as she can tolerate.   12/21/23   Nustep L4x10 minutes all four extremities seat 9, rest due to SOB PRN  Walking from yoga ball stack to silver trash can and back 2# each LE, SPC   LAQs 4# x15 B alternating  Seated HS curls 4# x15 B alternating  Standing marches yellow TB x10 B alternating  Standing hip ABD yellow TB x10 B alternating  Standing hip extension yellow TB x10 B alternating   Wide tandem 2x30 seconds B Standing on foam 3x30 seconds    PATIENT EDUCATION: Education details: HEP, PT plan of care, selfcare Person educated: Patient Education method: Explanation, Demonstration, Verbal cues, and Handouts Education comprehension: verbalized understanding, further education recommended   HOME EXERCISE PROGRAM: Access Code: [email or web address] URL: [email or web address] Date: 11/17/2023 Prepared by: Redell Moose  Exercises - Seated AAROM Shoulder Flexion  - 2 x daily - 6 x weekly - 3 sets - 10 reps - Standing 'L' Stretch at Counter  - 2 x daily - 6 x weekly - 1 sets - 10 reps - 5 hold - Standing March with Counter Support  - 2 x daily - 6 x weekly - 1 sets - 10 reps - Proper Sit to Stand Technique  - 2 x daily - 6 x weekly - 1-2 sets - 5 reps - Supine Shoulder Flexion Extension AAROM with Dowel  - 2 x daily - 6 x weekly - 1 sets - 10 reps - 5 sec hold  ASSESSMENT:  CLINICAL IMPRESSION: She was more  stiff and moving slow overall today. PT will updated goals and measurements for 10th visit progress note next visit.   EVAL:  Patient referred to PT for Physical deconditioning and other malaise. 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  She does have COPD, general weakness and poor endurance noted.  Patient will benefit from skilled PT to improve overall function and to address impairments and limitations listed below.  OBJECTIVE IMPAIRMENTS: decreased activity tolerance for ADL's, difficulty walking, decreased balance, decreased endurance, decreased mobility, decreased ROM, decreased strength, impaired flexibility, impaired UE/LE use, and pain.  ACTIVITY LIMITATIONS: bending, liftting, walking, standing, cleaning, community activity, reaching, carry,   PERSONAL FACTORS: see above PMH  also affecting patient's functional outcome.  REHAB POTENTIAL: Fair    CLINICAL DECISION MAKING: Evolving/moderate complexity  EVALUATION COMPLEXITY: Moderate    GOALS: Short term PT Goals Target date: 12/15/2023   Pt will be I and compliant with HEP. Baseline:  Goal status: New Pt will decrease pain by 25% overall Baseline: Goal status: New  Long term PT goals Target date:01/12/2024    Pt will improve TUG time to  less than 20 seconds to show improved endurance and balance Baseline: Goal status: New Pt will improve  strength to at least 4/5 MMT to improve functional strength Baseline: Goal status: New Pt will improve Patient specific functional scale (PSFS) to at least 5/10 to show improved function level  Baseline: Goal status: New Pt will reduce pain to overall less than 3/10 with usual activity and work activity. Baseline: Goal status: New Pt will improve 5 times sit to stand test to less than 25 seconds to show improved endurance and  balance Baseline: Goal status: New  PLAN: PT FREQUENCY: 1-3 times per week   PT DURATION: 6-8 weeks  PLANNED INTERVENTIONS  97110-Therapeutic exercises, 97530- Therapeutic activity, [id number]- Neuromuscular re-education, 97535- Self Care, 02859- Manual therapy, and G0283- Electrical stimulation (unattended)  PLAN FOR NEXT SESSION:   10th visit progress note work on general strength and conditioning, gait, right arm ROM as able, balance, HEP updates as appropriate.  NEXT MD VISIT:   Redell Moose, PT, DPT 01/07/2024 1:41 PM

## 2024-01-12 ENCOUNTER — Ambulatory Visit: Admitting: Physical Therapy

## 2024-01-12 ENCOUNTER — Encounter: Payer: Self-pay | Admitting: Physical Therapy

## 2024-01-12 DIAGNOSIS — M6281 Muscle weakness (generalized): Secondary | ICD-10-CM

## 2024-01-12 DIAGNOSIS — M5459 Other low back pain: Secondary | ICD-10-CM

## 2024-01-12 DIAGNOSIS — R2689 Other abnormalities of gait and mobility: Secondary | ICD-10-CM | POA: Diagnosis not present

## 2024-01-12 NOTE — Therapy (Signed)
 OUTPATIENT PHYSICAL THERAPY TREATMENT/Recert/progress note Progress Note reporting period date 11/17/23 to 01/12/24  See below for objective and subjective measurements relating to patients progress with PT.    Patient Name: Tracy Johnston MRN: [id number] DOB:04/09/1950, 73 y.o., female Today's Date: 01/12/2024  END OF SESSION:  PT End of Session - 01/12/24 1316     Visit Number 10    Number of Visits 18    Date for Recertification  02/23/24    Progress Note Due on Visit 20    PT Start Time 1310    PT Stop Time 1348    PT Time Calculation (min) 38 min    Activity Tolerance Patient limited by fatigue    Behavior During Therapy [facility] for tasks assessed/performed               Past Medical History:  Diagnosis Date   Arthritis    Asthma    Atrial fibrillation (HCC)    chronic. Was first diagnosed in her early 59s.   CAD (coronary artery disease) 09/06/2021   Cancer (HCC) 2003ish   , cervix   COPD (chronic obstructive pulmonary disease) (HCC)    Dysrhythmia    Heart murmur    Hypertension    Insomnia    Malignant neoplasm of kidney (HCC)    rt removed 2003   Mitral regurgitation    Mild to moderate. Normal EF 09/2010   Renal insufficiency    Sickle cell anemia (HCC)    Tobacco abuse    Past Surgical History:  Procedure Laterality Date   ABDOMINAL HYSTERECTOMY     arm fracture     rods, pins and bone graft.- to left arm from hip   BONE GRAFT HIP ILIAC CREST     to left arm   CERVICAL CONIZATION W/BX     NEPHRECTOMY  2003   TONSILLECTOMY     TOTAL HIP ARTHROPLASTY  10/17/2011   Procedure: TOTAL HIP ARTHROPLASTY;  Surgeon: Oneil JAYSON Herald, MD;  Location: [facility] OR;  Service: Orthopedics;  Laterality: Left;  Left Total Hip Arthroplasty   TOTAL HIP ARTHROPLASTY  01/02/2012   Procedure: TOTAL HIP ARTHROPLASTY;  Surgeon: Oneil JAYSON Herald, MD;  Location: [facility] OR;  Service: Orthopedics;  Laterality: Right;  Right Total Hip Arthroplasty   TOTAL KNEE ARTHROPLASTY Left 06/15/2020    Procedure: LEFT TOTAL KNEE ARTHROPLASTY;  Surgeon: Herald Oneil JAYSON, MD;  Location: [facility] OR;  Service: Orthopedics;  Laterality: Left;  Needs RNFA   TUBAL LIGATION     Patient Active Problem List   Diagnosis Date Noted   Impingement syndrome of right shoulder 12/10/2022   Calculus of gallbladder without cholecystitis without obstruction 11/27/2022   Cigarette smoker 05/05/2022   Nocturnal hypoxemia 02/11/2022   CAD (coronary artery disease) 09/06/2021   Centrilobular emphysema (HCC) 09/06/2021   Quadriceps weakness 10/18/2020   RUQ pain    Wheezing    S/P total knee arthroplasty, 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   Ventral hernia without obstruction or gangrene 04/18/2020   BMI 33.0-33.9,adult 08/12/2018   HLD (hyperlipidemia) 08/12/2018   LLQ abdominal pain 08/12/2018   Mixed stress and urge urinary incontinence 08/12/2018   History of bilateral hip arthroplasty 07/09/2017   Long-term current use of opiate analgesic 02/18/2017   On long term drug therapy 02/06/2017   Chronic low back pain 02/06/2017   Chronic pain syndrome 02/06/2017   Pain in left knee 11/22/2015   Incisional hernia, without obstruction or gangrene 09/09/2012   Acute blood loss anemia 10/20/2011  Class: Acute   COPD GOLD 0 /  CB 03/03/2011   Dyspnea 12/02/2010   Preop cardiovascular exam 10/31/2010   Claudication 10/18/2010   Atrial fibrillation (HCC)    Tobacco abuse    Hypertension     PCP: Tobie Guy, DO   REFERRING PROVIDER: Jacques Arlon PARAS, MD   REFERRING DIAG: Other malaise, Physical deconditioning.   Rationale for Evaluation and Treatment:  Rehabiliation  THERAPY DIAG:  Other abnormalities of gait and mobility  Muscle weakness (generalized)  Other low back pain  ONSET DATE: 3 month onset of worsening joint pain and stiffness, difficulty walking   SUBJECTIVE:                                                                                                                                                                                            SUBJECTIVE STATEMENT: She says she is feeling better today, pain is not as bad and she is walking better she feels.   EVAL: She relays 3 month onset of worsening joint pain and stiffness, difficulty walking. She says her right arm has something going on and she can not use it like she could before. Says she has to get help dressing and it takes her an hour. Says she is having trouble standing and walking, and balance. Her joints are all stiff and hurt all over, Relays chronic back pain. She denies N/T in her arms or legs  PERTINENT HISTORY:  See above PMH, emphysemia, bilat THA, TKA  PAIN:  NPRS scale: 3  Pain location:back, all joints, right shoulder Pain description: constant, achy, burning Aggravating factors: the weather, standing too long  Relieving factors: rest, meds, sitting, bending over    PRECAUTIONS: ,  None  RED FLAGS: None   WEIGHT BEARING RESTRICTIONS:  No  FALLS:  Has patient fallen in last 6 months? No  PLOF:  Needs assistance with homemaking  PATIENT GOALS:  Get more active, less stiff, less pain, stand and walk better  OBJECTIVE:  Note: Objective measures were completed at Evaluation unless otherwise noted.  PATIENT SURVEYS:  Patient-Specific Activity Scoring Scheme  0 represents unable to perform. 10 represents able to perform at prior level. 0 1 2 3 4 5 6 7 8 9  10 (Date and Score)   Activity Eval  01/12/24   1. Walking for 10 minutes 2   5  2. Dressing 3  2 (has decreased due to her arm, has torn RTC now and is being referred to surgeon)   3.     4.    5.    Score 2.5 3.5   Total score =  sum of the activity scores/number of activities Minimum detectable change (90%CI) for average score = 2 points Minimum detectable change (90%CI) for single activity score = 3 points  POSTURE:  flexed trunk   GAIT: Assistive device utilized: Single point cane Level of assistance: Modified  independence Comments: slow gait speed, gait unsteadiness without AD    UPPER EXTREMITY ROM:  Active ROM Right eval Left eval  Shoulder flexion A:40 P:90 120  Shoulder extension    Shoulder abduction    Shoulder adduction    Shoulder extension    Shoulder internal rotation    Shoulder external rotation    Elbow flexion    Elbow extension    Wrist flexion    Wrist extension    Wrist ulnar deviation    Wrist radial deviation    Wrist pronation    Wrist supination     (Blank rows = not tested)   UPPER EXTREMITY MMT:  MMT Right eval Left eval   Shoulder flexion 2 4   Shoulder extension     Shoulder abduction     Shoulder adduction     Shoulder extension     Shoulder internal rotation 3 4   Shoulder external rotation 3 4   Middle trapezius     Lower trapezius     Elbow flexion 3 4   Elbow extension 3 4   Wrist flexion     Wrist extension     Wrist ulnar deviation     Wrist radial deviation     Wrist pronation     Wrist supination     Grip strength 4 4    (Blank rows = not tested)   LUMBAR ROM:   Active  A/PROM  eval 01/12/24  Flexion 75% 75%  Extension 10% 25%  Right lateral flexion    Left lateral flexion    Right rotation 25% 50%  Left rotation 25% 50%   (Blank rows = not tested)   LOWER EXTREMITY MMT:    MMT in sitting Right eval Left eval Right/Left 01/12/24  Hip flexion 3 3 3+/3  Hip extension 3 3   Hip abduction 3 3   Hip adduction     Hip internal rotation     Hip external rotation     Knee flexion 3 3 4/3  Knee extension 3 3 4/3  Ankle dorsiflexion     Ankle plantarflexion     Ankle inversion     Ankle eversion      (Blank rows = not tested)     FUNCTIONAL TESTS:  11/17/23 5 times sit to stand: 43.03 seconds, must use UE support Timed up and go (TUG): 37 seconds, with SPC  01/12/24 5 times sit to stand:28.33 seconds, must use UE support Timed up and go (TUG): 18.83 seconds, with SPC  TREATMENT DATE:  01/12/24 Nustep L5x15 minutes all four extremities seat 9 for functional endurance Functional testing above: LAQs 4# X 2X10 bilat Walking with SPC for endurance 200 feet, seated rest beak 2 min, then 100 feet. (She gets short of breath but SPO2 levels remained above 96%)   01/07/24 Nustep L5x15 minutes all four extremities seat 9 for functional endurance LAQs 4# X 2X10 bilat Seated hamstring curls green 2X10 bilat Walking with SPC for endurance 200 feet, seated rest beak 2 min, then 100 feet. (She gets short of breath but SPO2 levels remained above 96%) Sit to stands 2X5 reps from Nu step seat, no UE support Sidestepping in bars 3 round trips Step ups 4 inch step, X 5 bilat with 2 UE support UBE 2 min pushing, 2 min pulling   PATIENT EDUCATION: Education details: HEP, PT plan of care, selfcare Person educated: Patient Education method: Explanation, Demonstration, Verbal cues, and Handouts Education comprehension: verbalized understanding, further education recommended   HOME EXERCISE PROGRAM: Access Code: [email or web address] URL: [email or web address] Date: 11/17/2023 Prepared by: Redell Moose  Exercises - Seated AAROM Shoulder Flexion  - 2 x daily - 6 x weekly - 3 sets - 10 reps - Standing 'L' Stretch at Counter  - 2 x daily - 6 x weekly - 1 sets - 10 reps - 5 hold - Standing March with Counter Support  - 2 x daily - 6 x weekly - 1 sets - 10 reps - Proper Sit to Stand Technique  - 2 x daily - 6 x weekly - 1-2 sets - 5 reps - Supine Shoulder Flexion Extension AAROM with Dowel  - 2 x daily - 6 x weekly - 1 sets - 10 reps - 5 sec hold  ASSESSMENT:  CLINICAL IMPRESSION: 10th visit progress note shows she has made good functional progress in her timed up and go (TUG) test and her 5 times sit to stand test. Lumbar ROM has also improved as well as right leg strength. Her left  leg remains very weak as well as her right shoulder. Recent MRI confirmed RTC pathology and she will meet with surgeon about this so we have discontinued working her right shoulder for now so did not test this today. Recert today to extend her PT plan of care as she has made progress but not yet met her PT goals.   EVAL:  Patient referred to PT for Physical deconditioning and other malaise. 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  She does have COPD, general weakness and poor endurance noted.  Patient will benefit from skilled PT to improve overall function and to address impairments and limitations listed below.  OBJECTIVE IMPAIRMENTS: decreased activity tolerance for ADL's, difficulty walking, decreased balance, decreased endurance, decreased mobility, decreased ROM, decreased strength, impaired flexibility, impaired UE/LE use, and pain.  ACTIVITY LIMITATIONS: bending, liftting, walking, standing, cleaning, community activity, reaching, carry,   PERSONAL FACTORS: see above PMH  also affecting patient's functional outcome.  REHAB POTENTIAL: Fair    CLINICAL DECISION MAKING: Evolving/moderate complexity  EVALUATION COMPLEXITY: Moderate    GOALS: Short term PT Goals Target date: 12/15/2023   Pt will be I and compliant with HEP. Baseline:  Goal status: MET 01/12/24 Pt will decrease pain by 25% overall Baseline: Goal status: MET 01/12/24  Long term PT goals Target date:02/23/2023    Pt will improve TUG time to  less than 20 seconds to  show improved endurance and balance Baseline: Goal status: MET 01/12/24 Pt will improve  strength to at least 4/5 MMT to improve functional strength Baseline: Goal status: ongoing 01/12/24 Pt will improve Patient specific functional scale (PSFS) to at least 5/10 to show improved function level Baseline: Goal status:  ongoing 01/12/24 Pt will reduce pain to overall less than 3/10 with usual activity and work activity. Baseline: Goal status: ongoing 01/12/24 Pt will improve 5 times sit to stand test to less than 25 seconds to show improved endurance and balance Baseline: Goal status: ongoing 01/12/24  PLAN: PT FREQUENCY: 1-3 times per week   PT DURATION: 6-8 weeks  PLANNED INTERVENTIONS  97110-Therapeutic exercises, 97530- Therapeutic activity, 97112- Neuromuscular re-education, 97535- Self Care, 02859- Manual therapy, and [id number]- Electrical stimulation (unattended)  PLAN FOR NEXT SESSION:  work on general strength and conditioning, gait balance, HEP updates as appropriate.  NEXT MD VISIT:   Redell Moose, PT, DPT 01/12/2024 1:28 PM

## 2024-01-25 ENCOUNTER — Other Ambulatory Visit: Payer: Self-pay | Admitting: Internal Medicine

## 2024-01-26 ENCOUNTER — Other Ambulatory Visit: Payer: Self-pay | Admitting: Internal Medicine

## 2024-01-26 ENCOUNTER — Ambulatory Visit: Payer: Self-pay | Admitting: Internal Medicine

## 2024-01-26 NOTE — Telephone Encounter (Signed)
 Pt is requesting refill of prednisone , not mentioned in LOV instructions to continue or d/c. Please advise, thank you!

## 2024-01-26 NOTE — Telephone Encounter (Signed)
 FYI Only or Action Required?: Action required by provider: update on patient condition and 911 called for EMS to come out and assess patient-very SOB and wheezing. Patient is going to refuse EMS. Patient is wanting prednisone  and albuterol  for her nebulizer.  Patient is followed in Pulmonology for COPD, last seen on 12/15/2023 by Darlean Ozell NOVAK, MD.  Called Nurse Triage reporting Shortness of Breath.  Symptoms began 2 days ago.  Interventions attempted: Rescue inhaler and Nebulizer treatments.  Symptoms are: gradually worsening.  Triage Disposition: Call EMS 911 Now  Patient/caregiver understands and will follow disposition?: Yes  E2C2 Pulmonary Triage - Initial Assessment Questions Chief Complaint (e.g., cough, sob, wheezing, fever, chills, sweat or additional symptoms) *Go to specific symptom protocol after initial questions. Patient calling to request prednisone  and albuterol  for her nebulizer. Patient is audibly short of breath and wheezing. Patient does endorses severe shortness of breath. Patient is unable to speak in full sentences without taking breathes in between words. Productive cough with clear sputum. Patient does a caregiver with her in the house. Patient is instructed to have her caregiver call 911 to have patient assess by EMS due to severity of symptoms. Patient states she won't go to the ED but is okay with EMS coming to assess her. Explained the importance of her being evaluated face to face. Patient verbalized understanding.   How long have symptoms been present? 2 days  Have you tested for COVID or Flu? Note: If not, ask patient if a home test can be taken. If so, instruct patient to call back for positive results. No  MEDICINES:   Have you used any OTC meds to help with symptoms? No If yes, ask What medications?   Have you used your inhalers/maintenance medication? Yes If yes, What medications? Albuterol  inhaler/nebulizer Pulmicort    If inhaler,  ask How many puffs and how often? Note: Review instructions on medication in the chart. Albuterol  inhaler 2 puffs Q4H PRN  OXYGEN : Do you wear supplemental oxygen ? No If yes, How many liters are you supposed to use?   Do you monitor your oxygen  levels? Yes If yes, What is your reading (oxygen  level) today? 93%  What is your usual oxygen  saturation reading?  (Note: Pulmonary O2 sats should be 90% or greater) 93%   Copied from CRM #[id number]. Topic: Clinical - Red Word Triage >> Jan 26, 2024 12:30 PM Russell PARAS wrote: Red Word that prompted transfer to Nurse Triage:   Difficulty breathing for 2 days Wheezing SOB Cough, productive. Phlegm is clear  Requesting prednisone  course  Pt of [facility] Reason for Disposition  SEVERE difficulty breathing (e.g., struggling for each breath, speaks in single words)  Answer Assessment - Initial Assessment Questions Patient endorses severe shortness of breath-no further questions. Patient states she has a caregiver in the house. Instructed for caregiver to call 911 for patient to be assessed.  1. RESPIRATORY STATUS: Describe your breathing? (e.g., wheezing, shortness of breath, unable to speak, severe coughing)      NA 2. ONSET: When did this breathing problem begin?      NA 3. PATTERN Does the difficult breathing come and go, or has it been constant since it started?      NA 4. SEVERITY: How bad is your breathing? (e.g., mild, moderate, severe)      NA 5. RECURRENT SYMPTOM: Have you had difficulty breathing before? If Yes, ask: When was the last time? and What happened that time?      NA 6. CARDIAC  HISTORY: Do you have any history of heart disease? (e.g., heart attack, angina, bypass surgery, angioplasty)      NA 7. LUNG HISTORY: Do you have any history of lung disease?  (e.g., pulmonary embolus, asthma, emphysema)     NA 8. CAUSE: What do you think is causing the breathing problem?      NA 9. OTHER SYMPTOMS:  Do you have any other symptoms? (e.g., chest pain, cough, dizziness, fever, runny nose)     NA 10. O2 SATURATION MONITOR:  Do you use an oxygen  saturation monitor (pulse oximeter) at home? If Yes, ask: What is your reading (oxygen  level) today? What is your usual oxygen  saturation reading? (e.g., 95%)       NA 11. PREGNANCY: Is there any chance you are pregnant? When was your last menstrual period?       NA 12. TRAVEL: Have you traveled out of the country in the last month? (e.g., travel history, exposures)       NA  Protocols used: Breathing Difficulty-A-AH

## 2024-01-27 ENCOUNTER — Ambulatory Visit: Admitting: Physical Therapy

## 2024-02-02 NOTE — Telephone Encounter (Signed)
 Acute appt with wert - be sure to ask if [place] is okay   Wheezing SOB Cough, productive. Phlegm is clear

## 2024-02-03 ENCOUNTER — Ambulatory Visit: Admitting: Physical Therapy

## 2024-02-03 NOTE — Therapy (Deleted)
 " OUTPATIENT PHYSICAL THERAPY TREATMENT/Recert/progress note Progress Note reporting period date 11/17/23 to 01/12/24  See below for objective and subjective measurements relating to patients progress with PT.    Patient Name: Tracy Johnston MRN: [id number] DOB:Dec 04, 1950, 74 y.o., female Today's Date: 02/03/2024  END OF SESSION:         Past Medical History:  Diagnosis Date   Arthritis    Asthma    Atrial fibrillation (HCC)    chronic. Was first diagnosed in her early 67s.   CAD (coronary artery disease) 09/06/2021   Cancer (HCC) 2003ish   , cervix   COPD (chronic obstructive pulmonary disease) (HCC)    Dysrhythmia    Heart murmur    Hypertension    Insomnia    Malignant neoplasm of kidney (HCC)    rt removed 2003   Mitral regurgitation    Mild to moderate. Normal EF 09/2010   Renal insufficiency    Sickle cell anemia (HCC)    Tobacco abuse    Past Surgical History:  Procedure Laterality Date   ABDOMINAL HYSTERECTOMY     arm fracture     rods, pins and bone graft.- to left arm from hip   BONE GRAFT HIP ILIAC CREST     to left arm   CERVICAL CONIZATION W/BX     NEPHRECTOMY  2003   TONSILLECTOMY     TOTAL HIP ARTHROPLASTY  10/17/2011   Procedure: TOTAL HIP ARTHROPLASTY;  Surgeon: Oneil JAYSON Herald, MD;  Location: [facility] OR;  Service: Orthopedics;  Laterality: Left;  Left Total Hip Arthroplasty   TOTAL HIP ARTHROPLASTY  01/02/2012   Procedure: TOTAL HIP ARTHROPLASTY;  Surgeon: Oneil JAYSON Herald, MD;  Location: [facility] OR;  Service: Orthopedics;  Laterality: Right;  Right Total Hip Arthroplasty   TOTAL KNEE ARTHROPLASTY Left 06/15/2020   Procedure: LEFT TOTAL KNEE ARTHROPLASTY;  Surgeon: Herald Oneil JAYSON, MD;  Location: [facility] OR;  Service: Orthopedics;  Laterality: Left;  Needs RNFA   TUBAL LIGATION     Patient Active Problem List   Diagnosis Date Noted   Impingement syndrome of right shoulder 12/10/2022   Calculus of gallbladder without cholecystitis without obstruction 11/27/2022    Cigarette smoker 05/05/2022   Nocturnal hypoxemia 02/11/2022   CAD (coronary artery disease) 09/06/2021   Centrilobular emphysema (HCC) 09/06/2021   Quadriceps weakness 10/18/2020   RUQ pain    Wheezing    S/P total knee arthroplasty, 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   Ventral hernia without obstruction or gangrene 04/18/2020   BMI 33.0-33.9,adult 08/12/2018   HLD (hyperlipidemia) 08/12/2018   LLQ abdominal pain 08/12/2018   Mixed stress and urge urinary incontinence 08/12/2018   History of bilateral hip arthroplasty 07/09/2017   Long-term current use of opiate analgesic 02/18/2017   On long term drug therapy 02/06/2017   Chronic low back pain 02/06/2017   Chronic pain syndrome 02/06/2017   Pain in left knee 11/22/2015   Incisional hernia, without obstruction or gangrene 09/09/2012   Acute blood loss anemia 10/20/2011    Class: Acute   COPD GOLD 0 /  CB 03/03/2011   Dyspnea 12/02/2010   Preop cardiovascular exam 10/31/2010   Claudication 10/18/2010   Atrial fibrillation (HCC)    Tobacco abuse    Hypertension     PCP: Tobie Guy, DO   REFERRING PROVIDER: Jacques Arlon PARAS, MD   REFERRING DIAG: Other malaise, Physical deconditioning.   Rationale for Evaluation and Treatment:  Rehabiliation  THERAPY DIAG:  No diagnosis found.  ONSET DATE: 3 month onset  of worsening joint pain and stiffness, difficulty walking   SUBJECTIVE:                                                                                                                                                                                           SUBJECTIVE STATEMENT: *** She says she is feeling better today, pain is not as bad and she is walking better she feels.   EVAL: She relays 3 month onset of worsening joint pain and stiffness, difficulty walking. She says her right arm has something going on and she can not use it like she could before. Says she has to get help dressing and it takes her an hour. Says she is having  trouble standing and walking, and balance. Her joints are all stiff and hurt all over, Relays chronic back pain. She denies N/T in her arms or legs  PERTINENT HISTORY:  See above PMH, emphysemia, bilat THA, TKA  PAIN:  NPRS scale: 3  Pain location:back, all joints, right shoulder Pain description: constant, achy, burning Aggravating factors: the weather, standing too long  Relieving factors: rest, meds, sitting, bending over    PRECAUTIONS: ,  None  RED FLAGS: None   WEIGHT BEARING RESTRICTIONS:  No  FALLS:  Has patient fallen in last 6 months? No  PLOF:  Needs assistance with homemaking  PATIENT GOALS:  Get more active, less stiff, less pain, stand and walk better  OBJECTIVE:  Note: Objective measures were completed at Evaluation unless otherwise noted.  PATIENT SURVEYS:  Patient-Specific Activity Scoring Scheme  0 represents unable to perform. 10 represents able to perform at prior level. 0 1 2 3 4 5 6 7 8 9  10 (Date and Score)   Activity Eval  01/12/24   1. Walking for 10 minutes 2   5  2. Dressing 3  2 (has decreased due to her arm, has torn RTC now and is being referred to surgeon)   3.     4.    5.    Score 2.5 3.5   Total score = sum of the activity scores/number of activities Minimum detectable change (90%CI) for average score = 2 points Minimum detectable change (90%CI) for single activity score = 3 points  POSTURE:  flexed trunk   GAIT: Assistive device utilized: Single point cane Level of assistance: Modified independence Comments: slow gait speed, gait unsteadiness without AD    UPPER EXTREMITY ROM:  Active ROM Right eval Left eval  Shoulder flexion A:40 P:90 120  Shoulder extension    Shoulder abduction    Shoulder adduction    Shoulder extension  Shoulder internal rotation    Shoulder external rotation    Elbow flexion    Elbow extension    Wrist flexion    Wrist extension    Wrist ulnar deviation    Wrist  radial deviation    Wrist pronation    Wrist supination     (Blank rows = not tested)   UPPER EXTREMITY MMT:  MMT Right eval Left eval   Shoulder flexion 2 4   Shoulder extension     Shoulder abduction     Shoulder adduction     Shoulder extension     Shoulder internal rotation 3 4   Shoulder external rotation 3 4   Middle trapezius     Lower trapezius     Elbow flexion 3 4   Elbow extension 3 4   Wrist flexion     Wrist extension     Wrist ulnar deviation     Wrist radial deviation     Wrist pronation     Wrist supination     Grip strength 4 4    (Blank rows = not tested)   LUMBAR ROM:   Active  A/PROM  eval 01/12/24  Flexion 75% 75%  Extension 10% 25%  Right lateral flexion    Left lateral flexion    Right rotation 25% 50%  Left rotation 25% 50%   (Blank rows = not tested)   LOWER EXTREMITY MMT:    MMT in sitting Right eval Left eval Right/Left 01/12/24  Hip flexion 3 3 3+/3  Hip extension 3 3   Hip abduction 3 3   Hip adduction     Hip internal rotation     Hip external rotation     Knee flexion 3 3 4/3  Knee extension 3 3 4/3  Ankle dorsiflexion     Ankle plantarflexion     Ankle inversion     Ankle eversion      (Blank rows = not tested)     FUNCTIONAL TESTS:  11/17/23 5 times sit to stand: 43.03 seconds, must use UE support Timed up and go (TUG): 37 seconds, with [facility]  01/12/24 5 times sit to stand:28.33 seconds, must use UE support Timed up and go (TUG): 18.83 seconds, with SPC                                                                                                                            TREATMENT DATE:  02/03/24 ***  01/12/24 Nustep L5x15 minutes all four extremities seat 9 for functional endurance Functional testing above: LAQs 4# X 2X10 bilat Walking with SPC for endurance 200 feet, seated rest beak 2 min, then 100 feet. (She gets short of breath but SPO2 levels remained above 96%)   01/07/24 Nustep L5x15 minutes  all four extremities seat 9 for functional endurance LAQs 4# X 2X10 bilat Seated hamstring curls green 2X10 bilat Walking with SPC for endurance 200 feet, seated rest beak 2  min, then 100 feet. (She gets short of breath but SPO2 levels remained above 96%) Sit to stands 2X5 reps from Nu step seat, no UE support Sidestepping in bars 3 round trips Step ups 4 inch step, X 5 bilat with 2 UE support UBE 2 min pushing, 2 min pulling   PATIENT EDUCATION: Education details: HEP, PT plan of care, selfcare Person educated: Patient Education method: Explanation, Demonstration, Verbal cues, and Handouts Education comprehension: verbalized understanding, further education recommended   HOME EXERCISE PROGRAM: Access Code: [email or web address] URL: [email or web address] Date: 11/17/2023 Prepared by: Redell Moose  Exercises - Seated AAROM Shoulder Flexion  - 2 x daily - 6 x weekly - 3 sets - 10 reps - Standing 'L' Stretch at Counter  - 2 x daily - 6 x weekly - 1 sets - 10 reps - 5 hold - Standing March with Counter Support  - 2 x daily - 6 x weekly - 1 sets - 10 reps - Proper Sit to Stand Technique  - 2 x daily - 6 x weekly - 1-2 sets - 5 reps - Supine Shoulder Flexion Extension AAROM with Dowel  - 2 x daily - 6 x weekly - 1 sets - 10 reps - 5 sec hold  ASSESSMENT:  CLINICAL IMPRESSION:  *** 10th visit progress note shows she has made good functional progress in her timed up and go (TUG) test and her 5 times sit to stand test. Lumbar ROM has also improved as well as right leg strength. Her left leg remains very weak as well as her right shoulder. Recent MRI confirmed RTC pathology and she will meet with surgeon about this so we have discontinued working her right shoulder for now so did not test this today. Recert today to extend her PT plan of care as she has made progress but not yet met her PT goals.   EVAL:  Patient referred to PT for Physical deconditioning and other malaise. 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  She does have COPD, general weakness and poor endurance noted.  Patient will benefit from skilled PT to improve overall function and to address impairments and limitations listed below.  OBJECTIVE IMPAIRMENTS: decreased activity tolerance for ADL's, difficulty walking, decreased balance, decreased endurance, decreased mobility, decreased ROM, decreased strength, impaired flexibility, impaired UE/LE use, and pain.  ACTIVITY LIMITATIONS: bending, liftting, walking, standing, cleaning, community activity, reaching, carry,   PERSONAL FACTORS: see above PMH  also affecting patient's functional outcome.  REHAB POTENTIAL: Fair    CLINICAL DECISION MAKING: Evolving/moderate complexity  EVALUATION COMPLEXITY: Moderate    GOALS: Short term PT Goals Target date: 12/15/2023   Pt will be I and compliant with HEP. Baseline:  Goal status: MET 01/12/24 Pt will decrease pain by 25% overall Baseline: Goal status: MET 01/12/24  Long term PT goals Target date:02/23/2023    Pt will improve TUG time to  less than 20 seconds to show improved endurance and balance Baseline: Goal status: MET 01/12/24 Pt will improve  strength to at least 4/5 MMT to improve functional strength Baseline: Goal status: ongoing 01/12/24 Pt will improve Patient specific functional scale (PSFS) to at least 5/10 to show improved function level Baseline: Goal status: ongoing 01/12/24 Pt will reduce pain to overall less than 3/10 with usual activity and work activity. Baseline: Goal status: ongoing 01/12/24 Pt will improve 5 times sit to stand test to less than  25 seconds to show improved endurance and balance Baseline: Goal status: ongoing 01/12/24  PLAN: PT FREQUENCY: 1-3 times per week   PT DURATION: 6-8 weeks  PLANNED INTERVENTIONS  97110-Therapeutic  exercises, 97530- Therapeutic activity, 97112- Neuromuscular re-education, 97535- Self Care, [id number]- Manual therapy, and G0283- Electrical stimulation (unattended)  PLAN FOR NEXT SESSION:  work on general strength and conditioning, gait balance, HEP updates as appropriate.  NEXT MD VISIT:   Shenee Wignall April Ma L Aliese Brannum, PT, DPT 02/03/2024 11:59 AM      "

## 2024-02-08 NOTE — Progress Notes (Unsigned)
 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 then has to rest/ uses [facility] parking  Cough: worse 1st thing in am / better p prednisone   Sleep: bed is flat/ 3 pillows under head s resp rx  SABA use: just trelegy / once neb [place] 02: 2lpm / has meter says over 90s Lung cancer screen: per [facility] Plan A = Automatic = Always=    Trelegy 100 take one click 1st in am and 2 two breaths - tug hard to get it going Work on inhaler technique:  Plan B = Backup (to supplement plan A, not to replace it) Only use your albuterol  inhaler as a rescue medication Plan C = Crisis (instead of Plan B but only if Plan B stops working) - only use your albuterol  nebulizer if you first try Plan B and it fails to help Plan D = Deltasone  = prednisone (refillable)  If ABC not working, Prednisone  10 mg take  4 each am x 2 days,   2 each am x 2 days,  1 each am x 2 days and stop  For cough > mucinex 1200 mg every 12 hours as needed  Please schedule a follow up office visit in 6 weeks, call sooner if needed with all medications /inhalers/ solutions in hand     09/09/2023  6 m f/u ov/[place] office/Tracy Johnston re: AB  maint on duoneb/budesonide  bid still smoking some Chief Complaint  Patient presents with   COPD    11m f/u - shob as usual   Dyspnea:  go to church/ [employer] gets let off at door /sometimes riding scooter esp at [employer]  Cough: clear mucus/ esp productive  in am clears after sev hours but not using duoneb/bud 1st thing as rec  Sleeping: flat wedge pillow s  noct   resp cc  SABA use:  only when goes out and not using on alternate days with alb neb vs hfa vs nothing prior to exertion  02: 2lpm hs  Patient Instructions  For cough/ congestion >  over the counter mucinex or  mucinex dm  up to maximum of  1200 mg every 12 hours as needed Take nebulizer treatment 1st thing in AM when wake up and another dose 12 hours later The key is to stop smoking completely before smoking completely stops you! Please schedule a follow up visit in 3 months but call sooner if needed  with all medications /inhalers/ solutions in hand   LDSCT  11/17/23  No nodules    12/15/2023  f/u ov/[place] office/Tracy Johnston re: AB/ 02 hs  maint on duoneb/bud  did not  bring meds   still  smoker Chief Complaint  Patient presents with   COPD    Shob   Dyspnea:  doing ex bike in [place] twice a week x 10 min  and walking with bars x 10 min  Cough: none  Sleeping: flat bed wedge s resp cc  SABA use:  not using  02:  2lpm hs / none during the day  Patient Instructions  To get the most out of exercise, you need to be continuously aware that you are short of breath, but never out of breath, for at least 20- 30 minutes daily  Remember the nebulizer is 1st thing in am and again sometime in evening  Be sure to time it where you use the nebulizer right before your leave to go to PT     02/09/2024 post ER  f/u ov/[place] office/Tracy Johnston re: *** maint on ***  No chief complaint on file.   Dyspnea:  *** Cough: *** Sleeping: ***   resp cc  SABA use: *** 02: ***  Lung cancer screening: ***   No obvious day to day or daytime variability or assoc excess/ purulent sputum or mucus plugs or hemoptysis or cp or chest tightness, subjective wheeze or overt sinus or hb symptoms.    Also denies any obvious fluctuation of symptoms with weather or environmental changes or other aggravating or alleviating factors except as outlined above   No unusual exposure hx or h/o childhood pna/ asthma or knowledge of premature  birth.  Current Allergies, Complete Past Medical History, Past Surgical History, Family History, and Social History were reviewed in [employer] record.  ROS  The following are not active complaints unless bolded Hoarseness, sore throat, dysphagia, dental problems, itching, sneezing,  nasal congestion or discharge of excess mucus or purulent secretions, ear ache,   fever, chills, sweats, unintended wt loss or wt gain, classically pleuritic or exertional cp,  orthopnea pnd or arm/hand swelling  or leg swelling, presyncope, palpitations, abdominal pain, anorexia, nausea, vomiting, diarrhea  or change in bowel habits or change in bladder habits, change in stools or change in urine, dysuria, hematuria,  rash, arthralgias, visual complaints, headache, numbness, weakness or ataxia or problems with walking or coordination,  change in mood or  memory.         Outpatient Medications Prior to Visit  Medication Sig Dispense Refill   albuterol  (PROAIR  HFA) 108 (90 Base) MCG/ACT inhaler 2 puffs every 4 hours as needed only  if your can't catch your breath 18 g 11   albuterol  (PROVENTIL ) (2.5 MG/3ML) 0.083% nebulizer solution Take 3 mLs (2.5 mg total) by nebulization every 6 (six) hours as needed for wheezing or shortness of breath. 75 mL 12   allopurinol  (ZYLOPRIM ) 300 MG tablet TAKE 1 TABLET BY MOUTH DAILY. 30 tablet 5   budesonide  (PULMICORT ) 0.5 MG/2ML nebulizer solution Take 2 mLs (0.5 mg total) by nebulization 2 (two) times daily. 120 mL 12   colchicine 0.6 MG tablet Take 0.6 mg by mouth as needed.     diltiazem  (CARDIZEM  CD) 240 MG 24 hr capsule Take 1 capsule (240 mg total) by mouth daily. 30 capsule 6   famotidine  (PEPCID ) 20 MG tablet TAKE 1 TABLET BY MOUTH DAILY AFTER SUPPER 30 tablet 11   furosemide  (LASIX ) 40 MG tablet TAKE 1 TABLET BY MOUTH DAILY 90 tablet 3   ibuprofen  (ADVIL ) 800 MG tablet Take 1 tablet (800 mg total) by mouth every 8 (eight) hours as needed. 60 tablet 3    ipratropium-albuterol  (DUONEB) 0.5-2.5 (3) MG/3ML SOLN Take 3 mLs by nebulization 2 (two) times daily. 180 mL 11   olmesartan -hydrochlorothiazide  (BENICAR  HCT) 40-12.5 MG per tablet Take 1 tablet by mouth daily.       Oxycodone  HCl 10 MG TABS Take 1 tablet 4 times a day by  oral route as needed.     pantoprazole  (PROTONIX ) 40 MG tablet TAKE 1 TABLET BY MOUTH ONCE DAILY 30-60 MINUTES BEFORE first meal of THE DAY 30 tablet 3   polyethylene glycol (MIRALAX  / GLYCOLAX ) 17 g packet Take 17 g by mouth daily. 30 each 0   predniSONE  (DELTASONE ) 10 MG tablet TAKE 4 TABLETS BY MOUTH EVERY MORNING FOR 2 DAYS, 2 TABLETS FOR 2 DAYS, TAKE 1 TABLET BY MOUTH EVERY MORNING FOR 2 DAYS THEN STOP 100 tablet 0   rosuvastatin  (CRESTOR ) 40 MG tablet TAKE 1 TABLET BY MOUTH DAILY 90 tablet 3   solifenacin (VESICARE) 10 MG tablet Take 10 mg by mouth daily.     triamcinolone ointment (KENALOG) 0.1 % Apply 1 Application topically 2 (two) times daily.     XARELTO  20 MG TABS tablet TAKE 1 TABLET BY MOUTH DAILY WITH SUPPER 90 tablet 1   No facility-administered medications prior to visit.             Past Medical History:  Diagnosis Date   Arthritis    Asthma    Atrial fibrillation (HCC)    chronic. Was first diagnosed in her early 62s.   Cancer (HCC) 2003ish   , cervix   COPD (chronic obstructive pulmonary disease) (HCC)    Dysrhythmia    Heart murmur    Hypertension    Insomnia    Malignant neoplasm of kidney (HCC)    rt removed 2003   Mitral regurgitation    Mild to moderate. Normal EF 09/2010   Renal insufficiency    Sickle cell anemia (HCC)    Tobacco abuse        Objective:    Wts  02/09/2024      ***  12/15/2023    201   09/09/2023      201  03/03/2023        201  12/31/2022      208  12/03/2022       206  11/04/2022      208   05/05/2022        206   03/27/22 207 lb (93.9 kg)  02/11/22 202 lb 12.8 oz (92 kg)  12/27/21 199 lb (90.3 kg)    Vital signs reviewed  02/09/2024  - Note at rest 02  sats  ***% on ***   General appearance:    ***      awkward slow somewhat waddling gait       Min bar ***               Assessment                                       "

## 2024-02-09 ENCOUNTER — Ambulatory Visit: Attending: Family Medicine

## 2024-02-09 ENCOUNTER — Inpatient Hospital Stay: Admitting: Internal Medicine

## 2024-02-09 DIAGNOSIS — M5459 Other low back pain: Secondary | ICD-10-CM | POA: Insufficient documentation

## 2024-02-09 DIAGNOSIS — R2689 Other abnormalities of gait and mobility: Secondary | ICD-10-CM | POA: Insufficient documentation

## 2024-02-09 DIAGNOSIS — G4734 Idiopathic sleep related nonobstructive alveolar hypoventilation: Secondary | ICD-10-CM

## 2024-02-09 DIAGNOSIS — M6281 Muscle weakness (generalized): Secondary | ICD-10-CM | POA: Diagnosis present

## 2024-02-09 DIAGNOSIS — J449 Chronic obstructive pulmonary disease, unspecified: Secondary | ICD-10-CM

## 2024-02-09 NOTE — Therapy (Signed)
 " OUTPATIENT PHYSICAL THERAPY TREATMENT     Patient Name: Tracy Johnston MRN: [id number] DOB:1950-07-07, 74 y.o., female Today's Date: 02/09/2024  END OF SESSION:  PT End of Session - 02/09/24 1430     Visit Number 11    Number of Visits 18    Date for Recertification  02/23/24    Progress Note Due on Visit 20    PT Start Time 1430    PT Stop Time 1510    PT Time Calculation (min) 40 min    Activity Tolerance Patient limited by fatigue    Behavior During Therapy [facility] for tasks assessed/performed               Past Medical History:  Diagnosis Date   Arthritis    Asthma    Atrial fibrillation (HCC)    chronic. Was first diagnosed in her early 19s.   CAD (coronary artery disease) 09/06/2021   Cancer (HCC) 2003ish   , cervix   COPD (chronic obstructive pulmonary disease) (HCC)    Dysrhythmia    Heart murmur    Hypertension    Insomnia    Malignant neoplasm of kidney (HCC)    rt removed 2003   Mitral regurgitation    Mild to moderate. Normal EF 09/2010   Renal insufficiency    Sickle cell anemia (HCC)    Tobacco abuse    Past Surgical History:  Procedure Laterality Date   ABDOMINAL HYSTERECTOMY     arm fracture     rods, pins and bone graft.- to left arm from hip   BONE GRAFT HIP ILIAC CREST     to left arm   CERVICAL CONIZATION W/BX     NEPHRECTOMY  2003   TONSILLECTOMY     TOTAL HIP ARTHROPLASTY  10/17/2011   Procedure: TOTAL HIP ARTHROPLASTY;  Surgeon: Oneil JAYSON Herald, MD;  Location: [facility] OR;  Service: Orthopedics;  Laterality: Left;  Left Total Hip Arthroplasty   TOTAL HIP ARTHROPLASTY  01/02/2012   Procedure: TOTAL HIP ARTHROPLASTY;  Surgeon: Oneil JAYSON Herald, MD;  Location: [facility] OR;  Service: Orthopedics;  Laterality: Right;  Right Total Hip Arthroplasty   TOTAL KNEE ARTHROPLASTY Left 06/15/2020   Procedure: LEFT TOTAL KNEE ARTHROPLASTY;  Surgeon: Herald Oneil JAYSON, MD;  Location: [facility] OR;  Service: Orthopedics;  Laterality: Left;  Needs RNFA   TUBAL LIGATION      Patient Active Problem List   Diagnosis Date Noted   Impingement syndrome of right shoulder 12/10/2022   Calculus of gallbladder without cholecystitis without obstruction 11/27/2022   Cigarette smoker 05/05/2022   Nocturnal hypoxemia 02/11/2022   CAD (coronary artery disease) 09/06/2021   Centrilobular emphysema (HCC) 09/06/2021   Quadriceps weakness 10/18/2020   RUQ pain    Wheezing    S/P total knee arthroplasty, 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   Ventral hernia without obstruction or gangrene 04/18/2020   BMI 33.0-33.9,adult 08/12/2018   HLD (hyperlipidemia) 08/12/2018   LLQ abdominal pain 08/12/2018   Mixed stress and urge urinary incontinence 08/12/2018   History of bilateral hip arthroplasty 07/09/2017   Long-term current use of opiate analgesic 02/18/2017   On long term drug therapy 02/06/2017   Chronic low back pain 02/06/2017   Chronic pain syndrome 02/06/2017   Pain in left knee 11/22/2015   Incisional hernia, without obstruction or gangrene 09/09/2012   Acute blood loss anemia 10/20/2011    Class: Acute   COPD GOLD 0 /  CB 03/03/2011   Dyspnea 12/02/2010   Preop cardiovascular exam  10/31/2010   Claudication 10/18/2010   Atrial fibrillation (HCC)    Tobacco abuse    Hypertension     PCP: Tobie Guy, DO   REFERRING PROVIDER: Jacques Arlon PARAS, MD   REFERRING DIAG: Other malaise, Physical deconditioning.   Rationale for Evaluation and Treatment:  Rehabiliation  THERAPY DIAG:  Other abnormalities of gait and mobility  Muscle weakness (generalized)  Other low back pain  ONSET DATE: 3 month onset of worsening joint pain and stiffness, difficulty walking   SUBJECTIVE:                                                                                                                                                                                           SUBJECTIVE STATEMENT: Pt reports she is feeling well today after being seen in the ED for COPD exacerbation on  01/27/24. Pt reports she feels good.  EVAL: She relays 3 month onset of worsening joint pain and stiffness, difficulty walking. She says her right arm has something going on and she can not use it like she could before. Says she has to get help dressing and it takes her an hour. Says she is having trouble standing and walking, and balance. Her joints are all stiff and hurt all over, Relays chronic back pain. She denies N/T in her arms or legs  PERTINENT HISTORY:  See above PMH, emphysemia, bilat THA, TKA  PAIN:  NPRS scale: 6 fluctuates Pain location:back, all joints, right shoulder Pain description: constant, achy, burning Aggravating factors: the weather, standing too long  Relieving factors: rest, meds, sitting, bending over    PRECAUTIONS: ,  None  RED FLAGS: None   WEIGHT BEARING RESTRICTIONS:  No  FALLS:  Has patient fallen in last 6 months? No  PLOF:  Needs assistance with homemaking  PATIENT GOALS:  Get more active, less stiff, less pain, stand and walk better  OBJECTIVE:  Note: Objective measures were completed at Evaluation unless otherwise noted.  PATIENT SURVEYS:  Patient-Specific Activity Scoring Scheme  0 represents unable to perform. 10 represents able to perform at prior level. 0 1 2 3 4 5 6 7 8 9  10 (Date and Score)   Activity Eval  01/12/24   1. Walking for 10 minutes 2   5  2. Dressing 3  2 (has decreased due to her arm, has torn RTC now and is being referred to surgeon)   3.     4.    5.    Score 2.5 3.5   Total score = sum of the activity scores/number of activities Minimum detectable change (90%CI) for average score = 2  points Minimum detectable change (90%CI) for single activity score = 3 points  POSTURE:  flexed trunk   GAIT: Assistive device utilized: Single point cane Level of assistance: Modified independence Comments: slow gait speed, gait unsteadiness without AD    UPPER EXTREMITY ROM:  Active ROM Right eval  Left eval  Shoulder flexion A:40 P:90 120  Shoulder extension    Shoulder abduction    Shoulder adduction    Shoulder extension    Shoulder internal rotation    Shoulder external rotation    Elbow flexion    Elbow extension    Wrist flexion    Wrist extension    Wrist ulnar deviation    Wrist radial deviation    Wrist pronation    Wrist supination     (Blank rows = not tested)   UPPER EXTREMITY MMT:  MMT Right eval Left eval   Shoulder flexion 2 4   Shoulder extension     Shoulder abduction     Shoulder adduction     Shoulder extension     Shoulder internal rotation 3 4   Shoulder external rotation 3 4   Middle trapezius     Lower trapezius     Elbow flexion 3 4   Elbow extension 3 4   Wrist flexion     Wrist extension     Wrist ulnar deviation     Wrist radial deviation     Wrist pronation     Wrist supination     Grip strength 4 4    (Blank rows = not tested)   LUMBAR ROM:   Active  A/PROM  eval 01/12/24  Flexion 75% 75%  Extension 10% 25%  Right lateral flexion    Left lateral flexion    Right rotation 25% 50%  Left rotation 25% 50%   (Blank rows = not tested)   LOWER EXTREMITY MMT:    MMT in sitting Right eval Left eval Right/Left 01/12/24  Hip flexion 3 3 3+/3  Hip extension 3 3   Hip abduction 3 3   Hip adduction     Hip internal rotation     Hip external rotation     Knee flexion 3 3 4/3  Knee extension 3 3 4/3  Ankle dorsiflexion     Ankle plantarflexion     Ankle inversion     Ankle eversion      (Blank rows = not tested)     FUNCTIONAL TESTS:  11/17/23 5 times sit to stand: 43.03 seconds, must use UE support Timed up and go (TUG): 37 seconds, with SPC  01/12/24 5 times sit to stand:28.33 seconds, must use UE support Timed up and go (TUG): 18.83 seconds, with SPC                                                                                                                            TREATMENT DATE:  02/09/24 Nustep  L5x 10 minutes all  four extremities seat 9 for functional endurance Sit to stands 2X5 reps no UE support LAQs 4# X 2X10 bilat Seated Hip Abduction 2 x 10 reps green Seated hamstring curls green 2X10 bilat Walking with SPC for endurance 100 feet x 2 reps, seated rest beak 30 sec between walks (She gets short of breath but SPO2 levels remained above 95%) Step up 4 step 4 x 5 reps BLE standing rest breaks in parallel bars  01/12/24 Nustep L5x15 minutes all four extremities seat 9 for functional endurance Functional testing above: LAQs 4# X 2X10 bilat Walking with SPC for endurance 200 feet, seated rest beak 2 min, then 100 feet. (She gets short of breath but SPO2 levels remained above 96%)   01/07/24 Nustep L5x15 minutes all four extremities seat 9 for functional endurance LAQs 4# X 2X10 bilat Seated hamstring curls green 2X10 bilat Walking with SPC for endurance 200 feet, seated rest beak 2 min, then 100 feet. (She gets short of breath but SPO2 levels remained above 96%) Sit to stands 2X5 reps from Nu step seat, no UE support Sidestepping in bars 3 round trips Step ups 4 inch step, X 5 bilat with 2 UE support UBE 2 min pushing, 2 min pulling   PATIENT EDUCATION: Education details: HEP, PT plan of care, selfcare Person educated: Patient Education method: Explanation, Demonstration, Verbal cues, and Handouts Education comprehension: verbalized understanding, further education recommended   HOME EXERCISE PROGRAM: Access Code: [email or web address] URL: [email or web address] Date: 11/17/2023 Prepared by: Redell Moose  Exercises - Seated AAROM Shoulder Flexion  - 2 x daily - 6 x weekly - 3 sets - 10 reps - Standing 'L' Stretch at Counter  - 2 x daily - 6 x weekly - 1 sets - 10 reps - 5 hold - Standing March with Counter Support  - 2 x daily - 6 x weekly - 1 sets - 10 reps - Proper Sit to Stand Technique  - 2 x daily - 6 x weekly - 1-2 sets - 5 reps - Supine Shoulder Flexion  Extension AAROM with Dowel  - 2 x daily - 6 x weekly - 1 sets - 10 reps - 5 sec hold  ASSESSMENT:  CLINICAL IMPRESSION:   Today's session focused on general LE strength and endurance. Pt tolerated session well demonstrating improved sit to stand quality with no UE assistance. Pt will continue to benefit from skilled PT in order to address impairments listed in this POC.  EVAL:  Patient referred to PT for Physical deconditioning and other malaise. 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  She does have COPD, general weakness and poor endurance noted.  Patient will benefit from skilled PT to improve overall function and to address impairments and limitations listed below.  OBJECTIVE IMPAIRMENTS: decreased activity tolerance for ADL's, difficulty walking, decreased balance, decreased endurance, decreased mobility, decreased ROM, decreased strength, impaired flexibility, impaired UE/LE use, and pain.  ACTIVITY LIMITATIONS: bending, liftting, walking, standing, cleaning, community activity, reaching, carry,   PERSONAL FACTORS: see above PMH  also affecting patient's functional outcome.  REHAB POTENTIAL: Fair    CLINICAL DECISION MAKING: Evolving/moderate complexity  EVALUATION COMPLEXITY: Moderate    GOALS: Short term PT Goals Target date: 12/15/2023   Pt will be I and compliant with HEP. Baseline:  Goal status: MET 01/12/24 Pt will decrease pain by 25% overall Baseline: Goal status: MET 01/12/24  Long term PT goals Target  date:02/23/2023    Pt will improve TUG time to  less than 20 seconds to show improved endurance and balance Baseline: Goal status: MET 01/12/24 Pt will improve  strength to at least 4/5 MMT to improve functional strength Baseline: Goal status: ongoing 01/12/24 Pt will improve Patient specific functional scale (PSFS) to at least  5/10 to show improved function level Baseline: Goal status: ongoing 01/12/24 Pt will reduce pain to overall less than 3/10 with usual activity and work activity. Baseline: Goal status: ongoing 01/12/24 Pt will improve 5 times sit to stand test to less than 25 seconds to show improved endurance and balance Baseline: Goal status: ongoing 01/12/24  PLAN: PT FREQUENCY: 1-3 times per week   PT DURATION: 6-8 weeks  PLANNED INTERVENTIONS  97110-Therapeutic exercises, 97530- Therapeutic activity, 97112- Neuromuscular re-education, 97535- Self Care, [id number]- Manual therapy, and [id number]- Electrical stimulation (unattended)  PLAN FOR NEXT SESSION:  work on general strength and conditioning, gait balance, HEP updates as appropriate.  NEXT MD VISIT:   Massie Ada PT, DPT 02/09/2024 3:29 PM       "

## 2024-02-10 ENCOUNTER — Encounter: Payer: Self-pay | Admitting: Internal Medicine

## 2024-02-10 ENCOUNTER — Ambulatory Visit: Admitting: Internal Medicine

## 2024-02-10 VITALS — BP 119/70 | HR 77 | Ht 62.0 in | Wt 205.0 lb

## 2024-02-10 DIAGNOSIS — Z87891 Personal history of nicotine dependence: Secondary | ICD-10-CM | POA: Diagnosis not present

## 2024-02-10 DIAGNOSIS — J449 Chronic obstructive pulmonary disease, unspecified: Secondary | ICD-10-CM | POA: Diagnosis not present

## 2024-02-10 DIAGNOSIS — R0902 Hypoxemia: Secondary | ICD-10-CM

## 2024-02-10 DIAGNOSIS — G4734 Idiopathic sleep related nonobstructive alveolar hypoventilation: Secondary | ICD-10-CM

## 2024-02-10 MED ORDER — BUDESONIDE 0.5 MG/2ML IN SUSP: 0.5000 mg | Freq: Two times a day (BID) | RESPIRATORY_TRACT | 12 refills | Status: DC

## 2024-02-10 MED ORDER — BUDESONIDE 0.5 MG/2ML IN SUSP: 0.5000 mg | Freq: Two times a day (BID) | RESPIRATORY_TRACT | 12 refills | Status: AC

## 2024-02-10 NOTE — Patient Instructions (Addendum)
 Duoneb should be used with budesonide  0.25 mg twice 1st thing in AM and again at suppertim    Please schedule a follow up visit in 3 months but call sooner if needed   Late add needs echo due to ? Component chf > ER 12/312/25

## 2024-02-10 NOTE — Progress Notes (Signed)
 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 then has to rest/ uses HC parking  Cough: worse 1st thing in am / better p prednisone   Sleep: bed is flat/ 3 pillows under head s resp rx  SABA use: just trelegy / once neb qoweek 02: 2lpm / has meter says over 90s Lung cancer screen: per [facility] Plan A = Automatic = Always=    Trelegy 100 take one click 1st in am and 2 two breaths - tug hard to get it going Work on inhaler technique:  Plan B = Backup (to supplement plan A, not to replace it) Only use your albuterol  inhaler as a rescue medication Plan C = Crisis (instead of Plan B but only if Plan B stops working) - only use your albuterol  nebulizer if you first try Plan B and it fails to help Plan D = Deltasone  = prednisone (refillable)  If ABC not working, Prednisone  10 mg take  4 each am x 2 days,   2 each am x 2 days,  1 each am x 2 days and stop  For cough > mucinex 1200 mg every 12 hours as needed  Please schedule a follow up office visit in 6 weeks, call sooner if needed with all medications /inhalers/ solutions in hand     09/09/2023  6 m f/u ov/[place] office/Annelle Behrendt re: AB  maint on duoneb/budesonide  bid still smoking some Chief Complaint  Patient presents with   COPD    13m f/u - shob as usual   Dyspnea:  go to church/ [employer] gets let off at door /sometimes riding scooter esp at [employer]  Cough: clear mucus/ esp productive  in am clears after sev hours but not using duoneb/bud 1st thing as rec  Sleeping: flat wedge pillow s  noct   resp cc  SABA use:  only when goes out and not using on alternate days with alb neb vs hfa vs nothing prior to exertion  02: 2lpm hs  Patient Instructions  For cough/ congestion >  over the counter mucinex or  mucinex dm  up to maximum of  1200 mg every 12 hours as needed Take nebulizer treatment 1st thing in AM when wake up and another dose 12 hours later The key is to stop smoking completely before smoking completely stops you! Please schedule a follow up visit in 3 months but call sooner if needed  with all medications /inhalers/ solutions in hand   LDSCT  11/17/23  No nodules    12/15/2023  f/u ov/[place] office/Ladawna Walgren re: AB/ 02 hs  maint on duoneb/bud  did not  bring meds   still  smoker Chief Complaint  Patient presents with   COPD    Shob   Dyspnea:  doing ex bike in [place] twice a week x 10 min  and walking with bars x 10 min  Cough: none  Sleeping: flat bed wedge s resp cc  SABA use:  not using  02:  2lpm hs / none during the day  Patient Instructions  To get the most out of exercise, you need to be continuously aware that you are short of breath, but never out of breath, for at least 20- 30 minutes daily  Remember the nebulizer is 1st thing in am and again sometime in evening  Be sure to time it where you use the nebulizer right before your leave to go to PT   01/27/24  portable ER 1.    Increased interstitial prominence, suggesting pulmonary venous congestion or trace edema.  2.    Stable cardiomegaly.   02/10/2024 post ER  f/u ov/[place] office/Anndee Connett re: AB/ 02 prn  maint on duoneb and tapered pred to 20 mg per day and no  cigs since last  ov Chief Complaint  Patient presents with   Hospitalization Follow-up    Copd  Shob    Dyspnea:  walking with cane / pedals bike x 10 min  Cough: smoker's rattle  Sleeping: bed is flat and 3 pillows s   resp cc  SABA use: twice daily duoneb no budesonie  02: 2lpm hs and not using daytime   Lung cancer screening: 11/17/23  1. No suspicious  pulmonary nodule.  2. Emphysema.  3. Coronary artery atherosclerosis.  4. Cholelithiasis.  4. Ectatic ascending thoracic aorta is not significantly changed.    No obvious day to day or daytime variability or assoc excess/ purulent sputum or mucus plugs or hemoptysis or cp or chest tightness, subjective wheeze or overt sinus or hb symptoms.    Also denies any obvious fluctuation of symptoms with weather or environmental changes or other aggravating or alleviating factors except as outlined above   No unusual exposure hx or h/o childhood pna/ asthma or knowledge of premature birth.  Current Allergies, Complete Past Medical History, Past Surgical History, Family History, and Social History were reviewed in [employer] record.  ROS  The following are not active complaints unless bolded Hoarseness, sore throat, dysphagia, dental problems, itching, sneezing,  nasal congestion or discharge of excess mucus or purulent secretions, ear ache,   fever, chills, sweats, unintended wt loss or wt gain, classically pleuritic or exertional cp,  orthopnea pnd or arm/hand swelling  or leg swelling, presyncope, palpitations, abdominal pain, anorexia, nausea, vomiting, diarrhea  or change in bowel habits or change in bladder habits, change in stools or change in urine, dysuria, hematuria,  rash, arthralgias, visual complaints, headache, numbness, weakness or ataxia or problems with walking or coordination,  change in mood or  memory.         Outpatient Medications Prior to Visit  Medication Sig Dispense Refill   albuterol  (PROAIR  HFA) 108 (90 Base) MCG/ACT inhaler 2 puffs every 4 hours as needed only  if your can't catch your breath 18 g 11   albuterol  (PROVENTIL ) (2.5 MG/3ML) 0.083% nebulizer solution Take 3 mLs (2.5 mg total) by nebulization every 6 (six) hours as needed for wheezing or shortness of breath. 75 mL 12   allopurinol  (ZYLOPRIM ) 300 MG tablet TAKE 1 TABLET BY MOUTH DAILY. 30  tablet 5   colchicine 0.6 MG tablet Take 0.6 mg by mouth as needed.     diltiazem  (CARDIZEM  CD) 240 MG 24 hr capsule Take 1 capsule (240 mg total) by mouth daily. 30 capsule 6   famotidine  (PEPCID ) 20 MG tablet TAKE 1 TABLET BY MOUTH DAILY AFTER SUPPER 30 tablet 11   furosemide  (LASIX ) 40 MG  tablet TAKE 1 TABLET BY MOUTH DAILY 90 tablet 3   ibuprofen  (ADVIL ) 800 MG tablet Take 1 tablet (800 mg total) by mouth every 8 (eight) hours as needed. 60 tablet 3   ipratropium-albuterol  (DUONEB) 0.5-2.5 (3) MG/3ML SOLN Take 3 mLs by nebulization 2 (two) times daily. 180 mL 11   olmesartan -hydrochlorothiazide  (BENICAR  HCT) 40-12.5 MG per tablet Take 1 tablet by mouth daily.       Oxycodone  HCl 10 MG TABS Take 1 tablet 4 times a day by oral route as needed.     pantoprazole  (PROTONIX ) 40 MG tablet TAKE 1 TABLET BY MOUTH ONCE DAILY 30-60 MINUTES BEFORE first meal of THE DAY 30 tablet 3   polyethylene glycol (MIRALAX  / GLYCOLAX ) 17 g packet Take 17 g by mouth daily. 30 each 0   predniSONE  (DELTASONE ) 10 MG tablet TAKE 4 TABLETS BY MOUTH EVERY MORNING FOR 2 DAYS, 2 TABLETS FOR 2 DAYS, TAKE 1 TABLET BY MOUTH EVERY MORNING FOR 2 DAYS THEN STOP 100 tablet 0   rosuvastatin  (CRESTOR ) 40 MG tablet TAKE 1 TABLET BY MOUTH DAILY 90 tablet 3   solifenacin (VESICARE) 10 MG tablet Take 10 mg by mouth daily.     triamcinolone ointment (KENALOG) 0.1 % Apply 1 Application topically 2 (two) times daily.     XARELTO  20 MG TABS tablet TAKE 1 TABLET BY MOUTH DAILY WITH SUPPER 90 tablet 1   budesonide  (PULMICORT ) 0.5 MG/2ML nebulizer solution Take 2 mLs (0.5 mg total) by nebulization 2 (two) times daily. 120 mL 12   No facility-administered medications prior to visit.             Past Medical History:  Diagnosis Date   Arthritis    Asthma    Atrial fibrillation (HCC)    chronic. Was first diagnosed in her early 49s.   Cancer (HCC) 2003ish   , cervix   COPD (chronic obstructive pulmonary disease) (HCC)    Dysrhythmia     Heart murmur    Hypertension    Insomnia    Malignant neoplasm of kidney (HCC)    rt removed 2003   Mitral regurgitation    Mild to moderate. Normal EF 09/2010   Renal insufficiency    Sickle cell anemia (HCC)    Tobacco abuse        Objective:    Wts  02/10/2024      205  12/15/2023    201   09/09/2023      201  03/03/2023        201  12/31/2022      208  12/03/2022       206  11/04/2022      208   05/05/2022        206   03/27/22 207 lb (93.9 kg)  02/11/22 202 lb 12.8 oz (92 kg)  12/27/21 199 lb (90.3 kg)    Vital signs reviewed  02/10/2024  - Note at rest 02 sats  98% on RA   General appearance:    elderly mod obese (by BMI(  bf walks with cane ralph has typical  rattling smokers cough       HEENT : Oropharynx  clear     NECK :  without  apparent JVD/ palpable Nodes/TM    LUNGS: no acc muscle use,  Min barrel  contour chest wall with bilateral  slightly decreased bs s audible wheeze and  without cough on insp or exp maneuvers and min  Hyperresonant  to  percussion bilaterally    CV:  RRR  no s3 or murmur or increase in P2, and no edema   ABD:  soft and nontender    MS:  Nl gait/ ext warm without deformities Or obvious joint restrictions  calf tenderness, cyanosis or clubbing     SKIN: warm and dry without lesions    NEURO:  alert, approp, nl sensorium with  no motor or cerebellar deficits apparent.                        Assessment     Assessment & Plan COPD GOLD 0 /  CB Quit smoking 01/2024 r with centrilobular emphysema on LDST 08/2021  - 02/11/2022  After extensive coaching inhaler device,  effectiveness =    75% with hfa and dpi  -  03/27/2022   Walked on RA  x  3  lap(s) =  approx 450  ft  @ mod pace, stopped due to end of study with lowest 02 sats 91% and  mild sob p 300 ft but not need to stop -  PFT's  04/08/22  FEV1 1.14 (52 % ) ratio 0.70  p 0 % improvement from saba p 0 prior to study with DLCO  9.87 (51%)   and FV curve min concave and ERV 39%  at wt 200   - Allergy screen 11/04/2022 >  Eos 0.1 /  IgE  100 - 11/04/2022 changed to duoneb up to qid with budesonide  0.25 mg in place of trelegy as does not fit criteria for copd and more of an AB pattern  for which dpi may aggravate the cough component  - 03/03/2023  After extensive coaching inhaler device,  effectiveness =    75% (short Ti) continue proair  prn and maint with duoneb/bud 0.5 bid  - 12/312/25 in ER with ? AB vs mild chf, not taking budesonide  - 02/10/2024 resume budesonide  0.25mg   with alb bid per neb  -  Echo ordered 02/10/23  She has more AB than copd which should improve if can maintain off cigs >  strongly encouraged  >>> needs repeat Echo since cxr at ER looks a bit wet and she has documented valvular heart dz   Nocturnal hypoxemia On 2lpm HS at 1st pulmonary ov 02/11/2022  --  03/27/2022   Walked on RA  x  3  lap(s) =  approx 450  ft  @ mod pace, stopped due to end of study with lowest 02 sats 91% and  mild sob p 300 ft but not need to stop - 09/09/2023   Walked on RA  x   approx 100  ft  @ slow  pace, stopped due to knee popping  with lowest 02 sats 93%   >>> still no need for daytime 02, continue 2lpm hs only          Each maintenance medication was reviewed in detail including emphasizing most importantly the difference between maintenance and prns and under what circumstances the prns are to be triggered using an action plan format where appropriate.  Total time for H and P, chart review, counseling, reviewing hfa/ neb/ 02/ puls3 sat device(s) and generating customized AVS unique to this office visit / same day charting = 30 min post ER ov to try to prevent repeats         AVS  Patient Instructions  Duoneb should be used with budesonide  0.25 mg twice 1st thing in AM and again  at suppertim    Please schedule a follow up visit in 3 months but call sooner if needed    Ozell America, MD 02/11/2024                                    "

## 2024-02-11 ENCOUNTER — Ambulatory Visit: Admitting: Nurse Practitioner

## 2024-02-11 ENCOUNTER — Telehealth: Payer: Self-pay | Admitting: Internal Medicine

## 2024-02-11 ENCOUNTER — Encounter: Payer: Self-pay | Admitting: Nurse Practitioner

## 2024-02-11 VITALS — BP 136/80 | HR 65 | Ht 64.0 in | Wt 202.4 lb

## 2024-02-11 DIAGNOSIS — I10 Essential (primary) hypertension: Secondary | ICD-10-CM

## 2024-02-11 DIAGNOSIS — E669 Obesity, unspecified: Secondary | ICD-10-CM

## 2024-02-11 DIAGNOSIS — N183 Chronic kidney disease, stage 3 unspecified: Secondary | ICD-10-CM | POA: Diagnosis not present

## 2024-02-11 DIAGNOSIS — I351 Nonrheumatic aortic (valve) insufficiency: Secondary | ICD-10-CM

## 2024-02-11 DIAGNOSIS — N1831 Chronic kidney disease, stage 3a: Secondary | ICD-10-CM

## 2024-02-11 DIAGNOSIS — I4821 Permanent atrial fibrillation: Secondary | ICD-10-CM

## 2024-02-11 DIAGNOSIS — R0609 Other forms of dyspnea: Secondary | ICD-10-CM

## 2024-02-11 DIAGNOSIS — E785 Hyperlipidemia, unspecified: Secondary | ICD-10-CM

## 2024-02-11 DIAGNOSIS — I38 Endocarditis, valve unspecified: Secondary | ICD-10-CM

## 2024-02-11 DIAGNOSIS — Z720 Tobacco use: Secondary | ICD-10-CM | POA: Diagnosis not present

## 2024-02-11 NOTE — Assessment & Plan Note (Addendum)
 On 2lpm HS at 1st pulmonary ov 02/11/2022  --  03/27/2022   Walked on RA  x  3  lap(s) =  approx 450  ft  @ mod pace, stopped due to end of study with lowest 02 sats 91% and  mild sob p 300 ft but not need to stop - 09/09/2023   Walked on RA  x   approx 100  ft  @ slow  pace, stopped due to knee popping  with lowest 02 sats 93%   >>> still no need for daytime 02, continue 2lpm hs only          Each maintenance medication was reviewed in detail including emphasizing most importantly the difference between maintenance and prns and under what circumstances the prns are to be triggered using an action plan format where appropriate.  Total time for H and P, chart review, counseling, reviewing hfa/ neb/ 02/ puls3 sat device(s) and generating customized AVS unique to this office visit / same day charting = 30 min post ER ov to try to prevent repeats

## 2024-02-11 NOTE — Progress Notes (Signed)
 "  Office Visit    Patient Name: Tracy Johnston Date of Encounter: 02/11/2024 PCP:  Tobie Guy, DO [facility]  Cardiologist:  Vina Gull, MD  Advanced Practice Provider:  No care team member to display Electrophysiologist:  None   Chief Complaint and HPI    Stepheny Johnston is a 74 y.o. female with a hx of HTN, permanent A-fib, CHF, valvular insufficiency, tobacco abuse, CKD, HLD, and COPD with O2 use, who presents today for scheduled follow-up.    Last seen by Dr. Gull on December 29, 2022. Was doing well at the time.   08/06/2023 - Today she presents for follow-up. Doing well. Wants assistance for weight loss. Denies any chest pain, shortness of breath, palpitations, syncope, presyncope, dizziness, orthopnea, PND, swelling or significant weight changes, acute bleeding, or claudication.  02/11/2024 - Here for follow-up. Says she saw Dr. Darlean yesterday and is receiving tx for bronchitis. Doing well from a cardiac standpoint. Denies any chest pain, shortness of breath, palpitations, syncope, presyncope, dizziness, orthopnea, PND, swelling or significant weight changes, acute bleeding, or claudication.  ROS: Negative. See HPI.  EKGs/Labs/Other Studies Reviewed:   The following studies were reviewed today:   EKG: EKG Interpretation Date/Time:  Thursday February 11 2024 10:26:00 EST Ventricular Rate:  65 PR Interval:    QRS Duration:  82 QT Interval:  416 QTC Calculation: 432 R Axis:   51  Text Interpretation: Atrial fibrillation Nonspecific T wave abnormality When compared with ECG of 29-Dec-2022 09:35, Nonspecific T wave abnormality no longer evident in Inferior leads T wave inversion no longer evident in Lateral leads Confirmed by Miriam Norris [phone]) on 02/11/2024 10:37:21 AM   Echo 12/2021:  1. Left ventricular ejection fraction, by estimation, is 55 to 60%. The  left ventricle has normal function. The left ventricle has no regional  wall motion  abnormalities. Left ventricular diastolic function could not  be evaluated. The average left  ventricular global longitudinal strain is -16.7 %. The global longitudinal  strain is abnormal.   2. Right ventricular systolic function is mildly reduced. The right  ventricular size is normal.   3. Left atrial size was severely dilated.   4. Right atrial size was mild to moderately dilated.   5. A small pericardial effusion is present. The pericardial effusion is  circumferential.   6. The mitral valve is grossly normal. Mild mitral valve regurgitation.  No evidence of mitral stenosis.   7. The aortic valve was not well visualized. Aortic valve regurgitation  is moderate. No aortic stenosis is present.   8. The inferior vena cava is normal in size with greater than 50%  respiratory variability, suggesting right atrial pressure of 3 mmHg.   Comparison(s): Prior images reviewed side by side. Changes from prior  study are noted. Stable LVEF and stable moderate AR.  Myoview  04/2020: There was no ST segment deviation noted during stress. The study is normal. There are no perfusion defects consistent with prior infarct or current ischemia. This is a low risk study. The left ventricular ejection fraction is normal (55-65%).  Recent Labs: 03/03/2023: ALT 13; BUN 13; Creatinine, Ser 1.23; Potassium 4.3; Sodium 143  Recent Lipid Panel No results found for: CHOL, TRIG, HDL, CHOLHDL, VLDL, LDLCALC, LDLDIRECT  Risk Assessment/Calculations:    The 10-year ASCVD risk score (Arnett DK, et al., 2019) is: 11.5%   Values used to calculate the score:     Age: 25 years     Clinically relevant sex: Female  Is Non-Hispanic African American: Yes     Diabetic: No     Tobacco smoker: No     Systolic Blood Pressure: 136 mmHg     Is BP treated: Yes     HDL Cholesterol: 56 mg/dL     Total Cholesterol: 130 mg/dL  Review of Systems    All other systems reviewed and are otherwise negative  except as noted above.  Physical Exam    VS:  BP 136/80 (BP Location: Left Arm)   Pulse 65   Ht 5' 4 (1.626 m)   Wt 202 lb 6.4 oz (91.8 kg)   SpO2 96%   BMI 34.74 kg/m  , BMI Body mass index is 34.74 kg/m.  Wt Readings from Last 3 Encounters:  02/11/24 202 lb 6.4 oz (91.8 kg)  02/10/24 205 lb (93 kg)  12/15/23 200 lb 12.8 oz (91.1 kg)   GEN: Well nourished, well developed, in no acute distress. HEENT: normal. Neck: Supple, no JVD, carotid bruits, or masses. Cardiac: S1/S2, irregular rhythm and regular rate, no murmurs, rubs, or gallops. No clubbing, cyanosis. No edema to BLE. Radials/PT 2+ and equal bilaterally.  Respiratory:  Respirations regular and unlabored, wheezing to auscultation bilaterally. Smoker's cough noted.  MS: No deformity or atrophy. Skin: Warm and dry, no rash. Neuro:  Strength and sensation are intact. Psych: Normal affect.  Assessment & Plan    Permanent A-fib Denies any tachycardia or palpitations.  Heart rate well-controlled today.  Continue diltiazem .  Denies any bleeding issues on Xarelto .  Continue Xarelto , on appropriate dosage. Heart healthy diet and regular cardiovascular exercise encouraged.   HTN Blood pressure stable. Continue current medication regimen. Discussed to monitor BP at home at least 2 hours after medications and sitting for 5-10 minutes. Heart healthy diet and regular cardiovascular exercise encouraged.    3. HLD LDL 70 02/2023. Tolerating Crestor  well.  No medication changes at this time.  Heart healthy diet and regular cardiovascular exercise encouraged.   5. CKD stage 3 Most recent labs show elevated kidney function. Avoid nephrotoxic agents.  No medication changes at this time.  Continue follow-up with PCP. Encouraged her to stay well hydrated and will recheck a BMET.   6. Valvular insufficiency Echo 12/2021 revealed moderate aortic valve regurgitation, mild mitral valve regurgitation.  Patient is asymptomatic.  Will update  Echo.   8.  Tobacco abuse Smoking cessation encouraged and discussed.  9. Obesity Weight loss via diet and exercise encouraged. Discussed the impact being overweight would have on cardiovascular risk.      Disposition: Follow up in 6 month(s) with Vina Gull, MD or APP.  Signed, Almarie Crate, NP "

## 2024-02-11 NOTE — Telephone Encounter (Signed)
 Cxr from ER suggests she needs a repeat Echo to be sure valves are still working ok    Dx doe

## 2024-02-11 NOTE — Assessment & Plan Note (Addendum)
 Quit smoking 01/2024 r with centrilobular emphysema on LDST 08/2021  - 02/11/2022  After extensive coaching inhaler device,  effectiveness =    75% with hfa and dpi  -  03/27/2022   Walked on RA  x  3  lap(s) =  approx 450  ft  @ mod pace, stopped due to end of study with lowest 02 sats 91% and  mild sob p 300 ft but not need to stop -  PFT's  04/08/22  FEV1 1.14 (52 % ) ratio 0.70  p 0 % improvement from saba p 0 prior to study with DLCO  9.87 (51%)   and FV curve min concave and ERV 39% at wt 200   - Allergy screen 11/04/2022 >  Eos 0.1 /  IgE  100 - 11/04/2022 changed to duoneb up to qid with budesonide  0.25 mg in place of trelegy as does not fit criteria for copd and more of an AB pattern  for which dpi may aggravate the cough component  - 03/03/2023  After extensive coaching inhaler device,  effectiveness =    75% (short Ti) continue proair  prn and maint with duoneb/bud 0.5 bid  - 12/312/25 in ER with ? AB vs mild chf, not taking budesonide  - 02/10/2024 resume budesonide  0.25mg   with alb bid per neb  -  Echo ordered 02/10/23  She has more AB than copd which should improve if can maintain off cigs >  strongly encouraged  >>> needs repeat Echo since cxr at ER looks a bit wet and she has documented valvular heart dz

## 2024-02-11 NOTE — Telephone Encounter (Signed)
 Atc x1 l detailed message per dpr - placing order for echo

## 2024-02-11 NOTE — Telephone Encounter (Signed)
 Copied from CRM [id number]. Topic: Appointments - Scheduling Inquiry for Clinic >> Feb 11, 2024  3:10 PM Rozanna MATSU wrote: Reason for CRM: Tracy Johnston with [facility] stated the patient as a ECHOCARDIOGRAM already schedule on 01/29 that come from Dr Miriam, they stated to cancel the one from Dr. Darlean. Thanks  Canceling

## 2024-02-11 NOTE — Patient Instructions (Signed)
 Medication Instructions:  Your physician recommends that you continue on your current medications as directed. Please refer to the Current Medication list given to you today.   Labwork: BMET t be completed in 1 month at [facility]  Testing/Procedures: Your physician has requested that you have an echocardiogram. Echocardiography is a painless test that uses sound waves to create images of your heart. It provides your doctor with information about the size and shape of your heart and how well your hearts chambers and valves are working. This procedure takes approximately one hour. There are no restrictions for this procedure. Please do NOT wear cologne, perfume, aftershave, or lotions (deodorant is allowed). Please arrive 15 minutes prior to your appointment time.  Please note: We ask at that you not bring children with you during ultrasound (echo/ vascular) testing. Due to room size and safety concerns, children are not allowed in the ultrasound rooms during exams. Our front office staff cannot provide observation of children in our lobby area while testing is being conducted. An adult accompanying a patient to their appointment will only be allowed in the ultrasound room at the discretion of the ultrasound technician under special circumstances. We apologize for any inconvenience.   Follow-Up: Your physician recommends that you schedule a follow-up appointment in: 6 months  Any Other Special Instructions Will Be Listed Below (If Applicable). Thank you for choosing [facility]!     If you need a refill on your cardiac medications before your next appointment, please call your pharmacy.

## 2024-02-15 ENCOUNTER — Ambulatory Visit: Admitting: Physical Therapy

## 2024-02-15 ENCOUNTER — Encounter: Payer: Self-pay | Admitting: Physical Therapy

## 2024-02-15 DIAGNOSIS — M5459 Other low back pain: Secondary | ICD-10-CM

## 2024-02-15 DIAGNOSIS — R2689 Other abnormalities of gait and mobility: Secondary | ICD-10-CM

## 2024-02-15 DIAGNOSIS — M6281 Muscle weakness (generalized): Secondary | ICD-10-CM

## 2024-02-15 NOTE — Therapy (Signed)
 " OUTPATIENT PHYSICAL THERAPY TREATMENT     Patient Name: Tracy Johnston MRN: [id number] DOB:1950-10-15, 74 y.o., female Today's Date: 02/15/2024  END OF SESSION:  PT End of Session - 02/15/24 1345     Visit Number 12    Number of Visits 18    Date for Recertification  02/23/24    Progress Note Due on Visit 20    PT Start Time 1300    PT Stop Time 1345    PT Time Calculation (min) 45 min    Activity Tolerance Patient limited by fatigue    Behavior During Therapy [facility] for tasks assessed/performed               Past Medical History:  Diagnosis Date   Arthritis    Asthma    Atrial fibrillation (HCC)    chronic. Was first diagnosed in her early 9s.   CAD (coronary artery disease) 09/06/2021   Cancer (HCC) 2003ish   , cervix   COPD (chronic obstructive pulmonary disease) (HCC)    Dysrhythmia    Heart murmur    Hypertension    Insomnia    Malignant neoplasm of kidney (HCC)    rt removed 2003   Mitral regurgitation    Mild to moderate. Normal EF 09/2010   Renal insufficiency    Sickle cell anemia (HCC)    Tobacco abuse    Past Surgical History:  Procedure Laterality Date   ABDOMINAL HYSTERECTOMY     arm fracture     rods, pins and bone graft.- to left arm from hip   BONE GRAFT HIP ILIAC CREST     to left arm   CERVICAL CONIZATION W/BX     NEPHRECTOMY  2003   TONSILLECTOMY     TOTAL HIP ARTHROPLASTY  10/17/2011   Procedure: TOTAL HIP ARTHROPLASTY;  Surgeon: Oneil JAYSON Herald, MD;  Location: [facility] OR;  Service: Orthopedics;  Laterality: Left;  Left Total Hip Arthroplasty   TOTAL HIP ARTHROPLASTY  01/02/2012   Procedure: TOTAL HIP ARTHROPLASTY;  Surgeon: Oneil JAYSON Herald, MD;  Location: [facility] OR;  Service: Orthopedics;  Laterality: Right;  Right Total Hip Arthroplasty   TOTAL KNEE ARTHROPLASTY Left 06/15/2020   Procedure: LEFT TOTAL KNEE ARTHROPLASTY;  Surgeon: Herald Oneil JAYSON, MD;  Location: [facility] OR;  Service: Orthopedics;  Laterality: Left;  Needs RNFA   TUBAL LIGATION      Patient Active Problem List   Diagnosis Date Noted   Impingement syndrome of right shoulder 12/10/2022   Calculus of gallbladder without cholecystitis without obstruction 11/27/2022   Cigarette smoker 05/05/2022   Nocturnal hypoxemia 02/11/2022   CAD (coronary artery disease) 09/06/2021   Centrilobular emphysema (HCC) 09/06/2021   Quadriceps weakness 10/18/2020   RUQ pain    Wheezing    S/P total knee arthroplasty, 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   Ventral hernia without obstruction or gangrene 04/18/2020   BMI 33.0-33.9,adult 08/12/2018   HLD (hyperlipidemia) 08/12/2018   LLQ abdominal pain 08/12/2018   Mixed stress and urge urinary incontinence 08/12/2018   History of bilateral hip arthroplasty 07/09/2017   Long-term current use of opiate analgesic 02/18/2017   On long term drug therapy 02/06/2017   Chronic low back pain 02/06/2017   Chronic pain syndrome 02/06/2017   Pain in left knee 11/22/2015   Incisional hernia, without obstruction or gangrene 09/09/2012   Acute blood loss anemia 10/20/2011    Class: Acute   COPD GOLD 0 /  CB 03/03/2011   Dyspnea 12/02/2010   Preop cardiovascular exam  10/31/2010   Claudication 10/18/2010   Atrial fibrillation (HCC)    Tobacco abuse    Hypertension     PCP: Tobie Guy, DO   REFERRING PROVIDER: Jacques Arlon PARAS, MD   REFERRING DIAG: Other malaise, Physical deconditioning.   Rationale for Evaluation and Treatment:  Rehabiliation  THERAPY DIAG:  Other abnormalities of gait and mobility  Muscle weakness (generalized)  Other low back pain  ONSET DATE: 3 month onset of worsening joint pain and stiffness, difficulty walking   SUBJECTIVE:                                                                                                                                                                                           SUBJECTIVE STATEMENT: She brings in her supplemental oxygen  that she is on now, states it is heavier than she  thought it would be. She wants to try the treadmill today.   EVAL: She relays 3 month onset of worsening joint pain and stiffness, difficulty walking. She says her right arm has something going on and she can not use it like she could before. Says she has to get help dressing and it takes her an hour. Says she is having trouble standing and walking, and balance. Her joints are all stiff and hurt all over, Relays chronic back pain. She denies N/T in her arms or legs  PERTINENT HISTORY:  See above PMH, emphysemia, bilat THA, TKA  PAIN:  NPRS scale: 6 fluctuates Pain location:back, all joints, right shoulder Pain description: constant, achy, burning Aggravating factors: the weather, standing too long  Relieving factors: rest, meds, sitting, bending over    PRECAUTIONS: ,  None  RED FLAGS: None   WEIGHT BEARING RESTRICTIONS:  No  FALLS:  Has patient fallen in last 6 months? No  PLOF:  Needs assistance with homemaking  PATIENT GOALS:  Get more active, less stiff, less pain, stand and walk better  OBJECTIVE:  Note: Objective measures were completed at Evaluation unless otherwise noted.  PATIENT SURVEYS:  Patient-Specific Activity Scoring Scheme  0 represents unable to perform. 10 represents able to perform at prior level. 0 1 2 3 4 5 6 7 8 9  10 (Date and Score)   Activity Eval  01/12/24   1. Walking for 10 minutes 2   5  2. Dressing 3  2 (has decreased due to her arm, has torn RTC now and is being referred to surgeon)   3.     4.    5.    Score 2.5 3.5   Total score = sum of the activity scores/number of activities Minimum detectable change (  90%CI) for average score = 2 points Minimum detectable change (90%CI) for single activity score = 3 points  POSTURE:  flexed trunk   GAIT: Assistive device utilized: Single point cane Level of assistance: Modified independence Comments: slow gait speed, gait unsteadiness without AD    UPPER EXTREMITY  ROM:  Active ROM Right eval Left eval  Shoulder flexion A:40 P:90 120  Shoulder extension    Shoulder abduction    Shoulder adduction    Shoulder extension    Shoulder internal rotation    Shoulder external rotation    Elbow flexion    Elbow extension    Wrist flexion    Wrist extension    Wrist ulnar deviation    Wrist radial deviation    Wrist pronation    Wrist supination     (Blank rows = not tested)   UPPER EXTREMITY MMT:  MMT Right eval Left eval   Shoulder flexion 2 4   Shoulder extension     Shoulder abduction     Shoulder adduction     Shoulder extension     Shoulder internal rotation 3 4   Shoulder external rotation 3 4   Middle trapezius     Lower trapezius     Elbow flexion 3 4   Elbow extension 3 4   Wrist flexion     Wrist extension     Wrist ulnar deviation     Wrist radial deviation     Wrist pronation     Wrist supination     Grip strength 4 4    (Blank rows = not tested)   LUMBAR ROM:   Active  A/PROM  eval 01/12/24  Flexion 75% 75%  Extension 10% 25%  Right lateral flexion    Left lateral flexion    Right rotation 25% 50%  Left rotation 25% 50%   (Blank rows = not tested)   LOWER EXTREMITY MMT:    MMT in sitting Right eval Left eval Right/Left 01/12/24  Hip flexion 3 3 3+/3  Hip extension 3 3   Hip abduction 3 3   Hip adduction     Hip internal rotation     Hip external rotation     Knee flexion 3 3 4/3  Knee extension 3 3 4/3  Ankle dorsiflexion     Ankle plantarflexion     Ankle inversion     Ankle eversion      (Blank rows = not tested)     FUNCTIONAL TESTS:  11/17/23 5 times sit to stand: 43.03 seconds, must use UE support Timed up and go (TUG): 37 seconds, with SPC  01/12/24 5 times sit to stand:28.33 seconds, must use UE support Timed up and go (TUG): 18.83 seconds, with SPC                                                                                                                             TREATMENT DATE:  02/15/24 Nustep L5x 11 minutes all four extremities seat 9 for functional endurance (SP02 99% after) Treadmill 1.0 mph for 4:30 seconds, max tolerated walk before fatigued, with bilat UE support (SP02 99% after) Sit to stands 2X5 reps no UE support LAQs 4# X 2X15 bilat Seated Hip Abduction 2 x 10 reps green Seated hamstring curls green 2X10 bilat Step up 6 step X 10 reps BLE with bilat UE support Lateral walking 3 round trips in bars without UE support March walking in bars 3 round trips with UE support   02/09/24 Nustep L5x 10 minutes all four extremities seat 9 for functional endurance Sit to stands 2X5 reps no UE support LAQs 4# X 2X10 bilat Seated Hip Abduction 2 x 10 reps green Seated hamstring curls green 2X10 bilat Walking with SPC for endurance 100 feet x 2 reps, seated rest beak 30 sec between walks (She gets short of breath but SPO2 levels remained above 95%) Step up 4 step 4 x 5 reps BLE standing rest breaks in parallel bars    PATIENT EDUCATION: Education details: HEP, PT plan of care, selfcare Person educated: Patient Education method: Explanation, Demonstration, Verbal cues, and Handouts Education comprehension: verbalized understanding, further education recommended   HOME EXERCISE PROGRAM: Access Code: [email or web address] URL: [email or web address] Date: 11/17/2023 Prepared by: Redell Moose  Exercises - Seated AAROM Shoulder Flexion  - 2 x daily - 6 x weekly - 3 sets - 10 reps - Standing 'L' Stretch at Counter  - 2 x daily - 6 x weekly - 1 sets - 10 reps - 5 hold - Standing March with Counter Support  - 2 x daily - 6 x weekly - 1 sets - 10 reps - Proper Sit to Stand Technique  - 2 x daily - 6 x weekly - 1-2 sets - 5 reps - Supine Shoulder Flexion Extension AAROM with Dowel  - 2 x daily - 6 x weekly - 1 sets - 10 reps - 5 sec hold  ASSESSMENT:  CLINICAL IMPRESSION:  She now has supplemental oxygen  so did need help managing this at  times as it is heavy for her and does not stay around her shoulder well. Oxygen  levels did not drop below 99% despite being short of breath with activity. Provided appropriate rest breaks and then continued with exercises to push her endurance levels.   EVAL:  Patient referred to PT for Physical deconditioning and other malaise. 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  She does have COPD, general weakness and poor endurance noted.  Patient will benefit from skilled PT to improve overall function and to address impairments and limitations listed below.  OBJECTIVE IMPAIRMENTS: decreased activity tolerance for ADL's, difficulty walking, decreased balance, decreased endurance, decreased mobility, decreased ROM, decreased strength, impaired flexibility, impaired UE/LE use, and pain.  ACTIVITY LIMITATIONS: bending, liftting, walking, standing, cleaning, community activity, reaching, carry,   PERSONAL FACTORS: see above PMH  also affecting patient's functional outcome.  REHAB POTENTIAL: Fair    CLINICAL DECISION MAKING: Evolving/moderate complexity  EVALUATION COMPLEXITY: Moderate    GOALS: Short term PT Goals Target date: 12/15/2023   Pt will be I and compliant with HEP. Baseline:  Goal status: MET 01/12/24 Pt will decrease pain by 25% overall Baseline: Goal status: MET 01/12/24  Long term PT goals Target date:02/23/2023    Pt will improve TUG time to  less than 20 seconds to show improved  endurance and balance Baseline: Goal status: MET 01/12/24 Pt will improve  strength to at least 4/5 MMT to improve functional strength Baseline: Goal status: ongoing 01/12/24 Pt will improve Patient specific functional scale (PSFS) to at least 5/10 to show improved function level Baseline: Goal status: ongoing 01/12/24 Pt will reduce pain to overall less than 3/10  with usual activity and work activity. Baseline: Goal status: ongoing 01/12/24 Pt will improve 5 times sit to stand test to less than 25 seconds to show improved endurance and balance Baseline: Goal status: ongoing 01/12/24  PLAN: PT FREQUENCY: 1-3 times per week   PT DURATION: 6-8 weeks  PLANNED INTERVENTIONS  97110-Therapeutic exercises, 97530- Therapeutic activity, 97112- Neuromuscular re-education, 97535- Self Care, [id number]- Manual therapy, and [id number]- Electrical stimulation (unattended)  PLAN FOR NEXT SESSION:  work on general strength and conditioning, gait balance, HEP updates as appropriate.  NEXT MD VISIT:   Redell Moose, PT, DPT 02/15/24 1:47 PM        "

## 2024-02-23 ENCOUNTER — Ambulatory Visit: Attending: Nurse Practitioner

## 2024-02-23 ENCOUNTER — Other Ambulatory Visit: Payer: Self-pay | Admitting: Internal Medicine

## 2024-02-23 DIAGNOSIS — I38 Endocarditis, valve unspecified: Secondary | ICD-10-CM

## 2024-02-23 LAB — ECHOCARDIOGRAM COMPLETE
AR max vel: 1.95 cm2
AV Peak grad: 8.5 mmHg
AV Vena cont: 0.6 cm
Ao pk vel: 1.46 m/s
Calc EF: 57.2 %
P 1/2 time: 833 ms
S' Lateral: 3.2 cm
Single Plane A2C EF: 58.6 %
Single Plane A4C EF: 54.9 %

## 2024-02-24 ENCOUNTER — Ambulatory Visit

## 2024-02-24 DIAGNOSIS — M5459 Other low back pain: Secondary | ICD-10-CM

## 2024-02-24 DIAGNOSIS — R2689 Other abnormalities of gait and mobility: Secondary | ICD-10-CM | POA: Diagnosis not present

## 2024-02-24 DIAGNOSIS — M6281 Muscle weakness (generalized): Secondary | ICD-10-CM

## 2024-02-24 NOTE — Therapy (Signed)
 " OUTPATIENT PHYSICAL THERAPY TREATMENT/RE-CERTIFICATION     Patient Name: Tracy Johnston MRN: [id number] DOB:07-Aug-1950, 74 y.o., female Today's Date: 02/24/2024  END OF SESSION:  PT End of Session - 02/24/24 1342     Visit Number 13    Number of Visits 24    Date for Recertification  04/20/24    Progress Note Due on Visit 24    PT Start Time 1345    PT Stop Time 1432    PT Time Calculation (min) 47 min    Equipment Utilized During Treatment Oxygen     Activity Tolerance Patient limited by fatigue    Behavior During Therapy WFL for tasks assessed/performed               Past Medical History:  Diagnosis Date   Arthritis    Asthma    Atrial fibrillation (HCC)    chronic. Was first diagnosed in her early 82s.   CAD (coronary artery disease) 09/06/2021   Cancer (HCC) 2003ish   , cervix   COPD (chronic obstructive pulmonary disease) (HCC)    Dysrhythmia    Heart murmur    Hypertension    Insomnia    Malignant neoplasm of kidney (HCC)    rt removed 2003   Mitral regurgitation    Mild to moderate. Normal EF 09/2010   Renal insufficiency    Sickle cell anemia (HCC)    Tobacco abuse    Past Surgical History:  Procedure Laterality Date   ABDOMINAL HYSTERECTOMY     arm fracture     rods, pins and bone graft.- to left arm from hip   BONE GRAFT HIP ILIAC CREST     to left arm   CERVICAL CONIZATION W/BX     NEPHRECTOMY  2003   TONSILLECTOMY     TOTAL HIP ARTHROPLASTY  10/17/2011   Procedure: TOTAL HIP ARTHROPLASTY;  Surgeon: Oneil JAYSON Herald, MD;  Location: [facility] OR;  Service: Orthopedics;  Laterality: Left;  Left Total Hip Arthroplasty   TOTAL HIP ARTHROPLASTY  01/02/2012   Procedure: TOTAL HIP ARTHROPLASTY;  Surgeon: Oneil JAYSON Herald, MD;  Location: [facility] OR;  Service: Orthopedics;  Laterality: Right;  Right Total Hip Arthroplasty   TOTAL KNEE ARTHROPLASTY Left 06/15/2020   Procedure: LEFT TOTAL KNEE ARTHROPLASTY;  Surgeon: Herald Oneil JAYSON, MD;  Location: [facility] OR;  Service:  Orthopedics;  Laterality: Left;  Needs RNFA   TUBAL LIGATION     Patient Active Problem List   Diagnosis Date Noted   Impingement syndrome of right shoulder 12/10/2022   Calculus of gallbladder without cholecystitis without obstruction 11/27/2022   Cigarette smoker 05/05/2022   Nocturnal hypoxemia 02/11/2022   CAD (coronary artery disease) 09/06/2021   Centrilobular emphysema (HCC) 09/06/2021   Quadriceps weakness 10/18/2020   RUQ pain    Wheezing    S/P total knee arthroplasty, 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   Ventral hernia without obstruction or gangrene 04/18/2020   BMI 33.0-33.9,adult 08/12/2018   HLD (hyperlipidemia) 08/12/2018   LLQ abdominal pain 08/12/2018   Mixed stress and urge urinary incontinence 08/12/2018   History of bilateral hip arthroplasty 07/09/2017   Long-term current use of opiate analgesic 02/18/2017   On long term drug therapy 02/06/2017   Chronic low back pain 02/06/2017   Chronic pain syndrome 02/06/2017   Pain in left knee 11/22/2015   Incisional hernia, without obstruction or gangrene 09/09/2012   Acute blood loss anemia 10/20/2011    Class: Acute   COPD GOLD 0 /  CB 03/03/2011  Dyspnea 12/02/2010   Preop cardiovascular exam 10/31/2010   Claudication 10/18/2010   Atrial fibrillation (HCC)    Tobacco abuse    Hypertension     PCP: Tobie Guy, DO   REFERRING PROVIDER: Jacques Arlon PARAS, MD   REFERRING DIAG: Other malaise, Physical deconditioning.   Rationale for Evaluation and Treatment:  Rehabiliation  THERAPY DIAG:  Other abnormalities of gait and mobility - Plan: PT plan of care cert/re-cert  Muscle weakness (generalized) - Plan: PT plan of care cert/re-cert  Other low back pain - Plan: PT plan of care cert/re-cert  ONSET DATE: 3 month onset of worsening joint pain and stiffness, difficulty walking   SUBJECTIVE:                                                                                                                                                                                            SUBJECTIVE STATEMENT: Pt reports to therapy doing well. She rates pain today as 6/10. She continues to bring supplemental oxygen . She states that she is not feeling as well today as last session.   EVAL: She relays 3 month onset of worsening joint pain and stiffness, difficulty walking. She says her right arm has something going on and she can not use it like she could before. Says she has to get help dressing and it takes her an hour. Says she is having trouble standing and walking, and balance. Her joints are all stiff and hurt all over, Relays chronic back pain. She denies N/T in her arms or legs  PERTINENT HISTORY:  See above PMH, emphysemia, bilat THA, TKA  PAIN:  NPRS scale: 6 fluctuates Pain location:back, all joints, right shoulder Pain description: constant, achy, burning Aggravating factors: the weather, standing too long  Relieving factors: rest, meds, sitting, bending over    PRECAUTIONS: ,  None  RED FLAGS: None   WEIGHT BEARING RESTRICTIONS:  No  FALLS:  Has patient fallen in last 6 months? No  PLOF:  Needs assistance with homemaking  PATIENT GOALS:  Get more active, less stiff, less pain, stand and walk better  OBJECTIVE:  Note: Objective measures were completed at Evaluation unless otherwise noted.  PATIENT SURVEYS:  Patient-Specific Activity Scoring Scheme  0 represents unable to perform. 10 represents able to perform at prior level. 0 1 2 3 4 5 6 7 8 9  10 (Date and Score)   Activity Eval  01/12/24  02/24/24  1. Walking for 10 minutes 2   5 7   2. Dressing 3  2 (has decreased due to her arm, has torn RTC now and is being referred to surgeon)  2 (has decreased due to her arm, has torn RTC now and is being referred to surgeon)   3.      4.     5.     Score 2.5 3.5    Total score = sum of the activity scores/number of activities Minimum detectable change (90%CI) for average score = 2  points Minimum detectable change (90%CI) for single activity score = 3 points  POSTURE:  flexed trunk   GAIT: Assistive device utilized: Single point cane Level of assistance: Modified independence Comments: slow gait speed, gait unsteadiness without AD    UPPER EXTREMITY ROM:  Active ROM Right eval Left eval NO LONGER TESTING DUE TO RTC TEAR  Shoulder flexion A:40 P:90 120   Shoulder extension     Shoulder abduction     Shoulder adduction     Shoulder extension     Shoulder internal rotation     Shoulder external rotation     Elbow flexion     Elbow extension     Wrist flexion     Wrist extension     Wrist ulnar deviation     Wrist radial deviation     Wrist pronation     Wrist supination      (Blank rows = not tested)   UPPER EXTREMITY MMT:  MMT Right eval Left eval NO LONGER TESTING DUE TO RTC TEAR  Shoulder flexion 2 4   Shoulder extension     Shoulder abduction     Shoulder adduction     Shoulder extension     Shoulder internal rotation 3 4   Shoulder external rotation 3 4   Middle trapezius     Lower trapezius     Elbow flexion 3 4   Elbow extension 3 4   Wrist flexion     Wrist extension     Wrist ulnar deviation     Wrist radial deviation     Wrist pronation     Wrist supination     Grip strength 4 4    (Blank rows = not tested)   LUMBAR ROM:   Active  A/PROM  eval 01/12/24 02/24/24  Flexion 75% 75% 75%  Extension 10% 25% 25%  Right lateral flexion     Left lateral flexion     Right rotation 25% 50% 50%  Left rotation 25% 50% 50%   (Blank rows = not tested)   LOWER EXTREMITY MMT:    MMT in sitting Right eval Left eval Right/Left 01/12/24 02/24/24  Hip flexion 3 3 3+/3 4/3+  Hip extension 3 3    Hip abduction 3 3  3+/3  Hip adduction      Hip internal rotation      Hip external rotation      Knee flexion 3 3 4/3 4/3  Knee extension 3 3 4/3 4/3  Ankle dorsiflexion      Ankle plantarflexion      Ankle inversion      Ankle  eversion       (Blank rows = not tested)     FUNCTIONAL TESTS:  11/17/23 5 times sit to stand: 43.03 seconds, must use UE support Timed up and go (TUG): 37 seconds, with [facility]  01/12/24 5 times sit to stand:28.33 seconds, must use UE support Timed up and go (TUG): 18.83 seconds, with [facility]  02/24/24 5 times sit to stand:  25.01 seconds, must use UE support Timed up and go (TUG): 18.57  seconds, with SPC  TREATMENT DATE:    02/24/24 Nustep L5x 8 minutes all four extremities seat 9 for functional endurance (SP02 98% after) LAQs 4# X 2X15 bilat Seated Hip Abduction 2 x 10 reps green Seated hamstring curls green 2X10 bilat Step up 6 step X 10 reps BLE with bilat UE support   02/15/24 Nustep L5x 11 minutes all four extremities seat 9 for functional endurance (SP02 99% after) Treadmill 1.0 mph for 4:30 seconds, max tolerated walk before fatigued, with bilat UE support (SP02 99% after) Sit to stands 2X5 reps no UE support LAQs 4# X 2X15 bilat Seated Hip Abduction 3 x 10 reps green Seated hamstring curls green 2X10 bilat Step up 6 step X 10 reps BLE with bilat UE support    02/09/24 Nustep L5x 10 minutes all four extremities seat 9 for functional endurance Sit to stands 2X5 reps no UE support LAQs 4# X 2X10 bilat Seated Hip Abduction 2 x 10 reps green Seated hamstring curls green 2X10 bilat Walking with SPC for endurance 100 feet x 2 reps, seated rest beak 30 sec between walks (She gets short of breath but SPO2 levels remained above 95%) Step up 4 step 4 x 5 reps BLE standing rest breaks in parallel bars    PATIENT EDUCATION: Education details: HEP, PT plan of care, selfcare Person educated: Patient Education method: Explanation, Demonstration, Verbal cues, and Handouts Education comprehension: verbalized understanding, further education  recommended   HOME EXERCISE PROGRAM: Access Code: [email or web address] URL: [email or web address] Date: 11/17/2023 Prepared by: Redell Moose  Exercises - Seated AAROM Shoulder Flexion  - 2 x daily - 6 x weekly - 3 sets - 10 reps - Standing 'L' Stretch at Counter  - 2 x daily - 6 x weekly - 1 sets - 10 reps - 5 hold - Standing March with Counter Support  - 2 x daily - 6 x weekly - 1 sets - 10 reps - Proper Sit to Stand Technique  - 2 x daily - 6 x weekly - 1-2 sets - 5 reps - Supine Shoulder Flexion Extension AAROM with Dowel  - 2 x daily - 6 x weekly - 1 sets - 10 reps - 5 sec hold  ASSESSMENT:  CLINICAL IMPRESSION:  Pt tolerated session well demonstrating improvements in 5 time sit to stand test and TUG testing indicating improvements in strength and balance. The patient will continue from skilled PT to address ongoing impairments.   EVAL:  Patient referred to PT for Physical deconditioning and other malaise. 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  She does have COPD, general weakness and poor endurance noted.  Patient will benefit from skilled PT to improve overall function and to address impairments and limitations listed below.  OBJECTIVE IMPAIRMENTS: decreased activity tolerance for ADL's, difficulty walking, decreased balance, decreased endurance, decreased mobility, decreased ROM, decreased strength, impaired flexibility, impaired UE/LE use, and pain.  ACTIVITY LIMITATIONS: bending, liftting, walking, standing, cleaning, community activity, reaching, carry,   PERSONAL FACTORS: see above PMH  also affecting patient's functional outcome.  REHAB POTENTIAL: Fair    CLINICAL DECISION MAKING: Evolving/moderate complexity  EVALUATION COMPLEXITY: Moderate    GOALS: Short term PT Goals Target date: 12/15/2023   Pt will be I and compliant with  HEP. Baseline:  Goal status: MET 01/12/24 Pt will decrease pain by 25% overall Baseline: Goal status: MET 01/12/24  Long term PT goals Target date:02/23/2023  Pt will improve TUG time to  less than 20 seconds to show improved endurance and balance Baseline: Goal status: MET 01/12/24 Pt will improve  strength to at least 4/5 MMT to improve functional strength Baseline: Goal status: ongoing 02/24/24 Pt will improve Patient specific functional scale (PSFS) to at least 5/10 to show improved function level Baseline: Goal status:  ongoing 02/24/24 Pt will reduce pain to overall less than 3/10 with usual activity and work activity. Baseline: Goal status: ongoing  Pt will improve 5 times sit to stand test to less than 25 seconds to show improved endurance and balance Baseline: Goal status: ongoing 02/24/24  PLAN: PT FREQUENCY: 1-3 times per week   PT DURATION:  6-8 weeks  PLANNED INTERVENTIONS  97110-Therapeutic exercises, 97530- Therapeutic activity, 97112- Neuromuscular re-education, 97535- Self Care, [id number]- Manual therapy, and [id number]- Electrical stimulation (unattended)  PLAN FOR NEXT SESSION:  work on general strength and conditioning, gait balance, HEP updates as appropriate.  NEXT MD VISIT:   Massie Ada PT, DPT 02/24/24 2:42 PM         "

## 2024-02-25 ENCOUNTER — Encounter: Payer: Self-pay | Admitting: *Deleted

## 2024-02-25 ENCOUNTER — Ambulatory Visit: Payer: Self-pay | Admitting: Nurse Practitioner

## 2024-02-25 ENCOUNTER — Ambulatory Visit

## 2024-02-29 ENCOUNTER — Ambulatory Visit: Admitting: Physical Therapy

## 2024-03-03 ENCOUNTER — Ambulatory Visit

## 2024-03-07 ENCOUNTER — Ambulatory Visit: Admitting: Physical Therapy

## 2024-03-08 ENCOUNTER — Ambulatory Visit

## 2024-03-09 ENCOUNTER — Ambulatory Visit

## 2024-03-10 ENCOUNTER — Ambulatory Visit

## 2024-03-14 ENCOUNTER — Ambulatory Visit: Admitting: Physical Therapy

## 2024-03-16 ENCOUNTER — Ambulatory Visit

## 2024-03-21 ENCOUNTER — Ambulatory Visit

## 2024-03-23 ENCOUNTER — Ambulatory Visit

## 2024-03-28 ENCOUNTER — Ambulatory Visit

## 2024-03-30 ENCOUNTER — Ambulatory Visit

## 2024-04-04 ENCOUNTER — Ambulatory Visit

## 2024-04-06 ENCOUNTER — Ambulatory Visit: Admitting: Physical Therapy

## 2024-05-11 ENCOUNTER — Ambulatory Visit: Admitting: Internal Medicine
